# Patient Record
Sex: Female | Born: 1940 | Race: White | Hispanic: No | Marital: Married | Smoking: Never smoker
Health system: Southern US, Community
[De-identification: ages and names within clinical notes are randomized; demographics above are authoritative.]

## PROBLEM LIST (undated history)

## (undated) DIAGNOSIS — Z87898 Personal history of other specified conditions: Secondary | ICD-10-CM

## (undated) DIAGNOSIS — M8580 Other specified disorders of bone density and structure, unspecified site: Secondary | ICD-10-CM

## (undated) DIAGNOSIS — G43909 Migraine, unspecified, not intractable, without status migrainosus: Secondary | ICD-10-CM

## (undated) DIAGNOSIS — F419 Anxiety disorder, unspecified: Secondary | ICD-10-CM

## (undated) DIAGNOSIS — M1990 Unspecified osteoarthritis, unspecified site: Secondary | ICD-10-CM

## (undated) DIAGNOSIS — H9190 Unspecified hearing loss, unspecified ear: Secondary | ICD-10-CM

## (undated) DIAGNOSIS — I447 Left bundle-branch block, unspecified: Secondary | ICD-10-CM

## (undated) DIAGNOSIS — K589 Irritable bowel syndrome without diarrhea: Secondary | ICD-10-CM

## (undated) DIAGNOSIS — Z8669 Personal history of other diseases of the nervous system and sense organs: Secondary | ICD-10-CM

## (undated) DIAGNOSIS — I700 Atherosclerosis of aorta: Secondary | ICD-10-CM

## (undated) DIAGNOSIS — K219 Gastro-esophageal reflux disease without esophagitis: Secondary | ICD-10-CM

## (undated) DIAGNOSIS — E785 Hyperlipidemia, unspecified: Secondary | ICD-10-CM

## (undated) DIAGNOSIS — IMO0002 Reserved for concepts with insufficient information to code with codable children: Secondary | ICD-10-CM

## (undated) HISTORY — DX: Migraine, unspecified, not intractable, without status migrainosus: G43.909

## (undated) HISTORY — DX: Personal history of other diseases of the nervous system and sense organs: Z86.69

## (undated) HISTORY — DX: Reserved for concepts with insufficient information to code with codable children: IMO0002

## (undated) HISTORY — DX: Left bundle-branch block, unspecified: I44.7

## (undated) HISTORY — DX: Unspecified hearing loss, unspecified ear: H91.90

## (undated) HISTORY — DX: Hyperlipidemia, unspecified: E78.5

## (undated) HISTORY — DX: Personal history of other specified conditions: Z87.898

## (undated) HISTORY — DX: Unspecified osteoarthritis, unspecified site: M19.90

## (undated) HISTORY — DX: Atherosclerosis of aorta: I70.0

## (undated) HISTORY — DX: Gastro-esophageal reflux disease without esophagitis: K21.9

## (undated) HISTORY — DX: Irritable bowel syndrome, unspecified: K58.9

## (undated) HISTORY — DX: Other specified disorders of bone density and structure, unspecified site: M85.80

## (undated) HISTORY — DX: Anxiety disorder, unspecified: F41.9

---
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
## 1981-12-08 HISTORY — PX: ABDOMINAL HYSTERECTOMY: SHX81

## 1981-12-08 HISTORY — PX: APPENDECTOMY: SHX54

## 1990-12-08 HISTORY — PX: CHOLECYSTECTOMY: SHX55

## 1995-12-09 HISTORY — PX: CARDIAC CATHETERIZATION: SHX172

## 1998-03-07 ENCOUNTER — Ambulatory Visit (HOSPITAL_COMMUNITY): Admission: RE | Admit: 1998-03-07 | Discharge: 1998-03-07 | Payer: Self-pay

## 1998-03-20 ENCOUNTER — Ambulatory Visit (HOSPITAL_COMMUNITY): Admission: RE | Admit: 1998-03-20 | Discharge: 1998-03-20 | Payer: Self-pay

## 1999-04-24 ENCOUNTER — Ambulatory Visit (HOSPITAL_COMMUNITY): Admission: RE | Admit: 1999-04-24 | Discharge: 1999-04-24 | Payer: Self-pay

## 1999-04-26 ENCOUNTER — Ambulatory Visit (HOSPITAL_COMMUNITY): Admission: RE | Admit: 1999-04-26 | Discharge: 1999-04-26 | Payer: Self-pay

## 2000-04-29 ENCOUNTER — Ambulatory Visit (HOSPITAL_COMMUNITY): Admission: RE | Admit: 2000-04-29 | Discharge: 2000-04-29 | Payer: Self-pay

## 2001-06-09 ENCOUNTER — Ambulatory Visit (HOSPITAL_COMMUNITY): Admission: RE | Admit: 2001-06-09 | Discharge: 2001-06-09 | Payer: Self-pay

## 2001-12-08 DIAGNOSIS — IMO0002 Reserved for concepts with insufficient information to code with codable children: Secondary | ICD-10-CM

## 2001-12-08 HISTORY — PX: UPPER GASTROINTESTINAL ENDOSCOPY: SHX188

## 2001-12-08 HISTORY — DX: Reserved for concepts with insufficient information to code with codable children: IMO0002

## 2001-12-08 HISTORY — PX: COLONOSCOPY: SHX174

## 2001-12-08 LAB — HM COLONOSCOPY

## 2002-06-13 ENCOUNTER — Ambulatory Visit (HOSPITAL_COMMUNITY): Admission: RE | Admit: 2002-06-13 | Discharge: 2002-06-13 | Payer: Self-pay | Admitting: Endocrinology

## 2002-11-10 ENCOUNTER — Encounter (INDEPENDENT_AMBULATORY_CARE_PROVIDER_SITE_OTHER): Payer: Self-pay | Admitting: Specialist

## 2002-11-10 ENCOUNTER — Inpatient Hospital Stay (HOSPITAL_COMMUNITY): Admission: AD | Admit: 2002-11-10 | Discharge: 2002-11-11 | Payer: Self-pay | Admitting: Cardiology

## 2003-07-05 ENCOUNTER — Ambulatory Visit (HOSPITAL_COMMUNITY): Admission: RE | Admit: 2003-07-05 | Discharge: 2003-07-05 | Payer: Self-pay | Admitting: Endocrinology

## 2003-12-09 DIAGNOSIS — M8580 Other specified disorders of bone density and structure, unspecified site: Secondary | ICD-10-CM

## 2003-12-09 HISTORY — DX: Other specified disorders of bone density and structure, unspecified site: M85.80

## 2003-12-09 HISTORY — PX: OTHER SURGICAL HISTORY: SHX169

## 2004-07-10 ENCOUNTER — Ambulatory Visit (HOSPITAL_COMMUNITY): Admission: RE | Admit: 2004-07-10 | Discharge: 2004-07-10 | Payer: Self-pay | Admitting: Endocrinology

## 2004-08-09 ENCOUNTER — Encounter: Payer: Self-pay | Admitting: Internal Medicine

## 2005-07-11 ENCOUNTER — Ambulatory Visit (HOSPITAL_COMMUNITY): Admission: RE | Admit: 2005-07-11 | Discharge: 2005-07-11 | Payer: Self-pay | Admitting: Endocrinology

## 2006-07-17 ENCOUNTER — Ambulatory Visit (HOSPITAL_COMMUNITY): Admission: RE | Admit: 2006-07-17 | Discharge: 2006-07-17 | Payer: Self-pay | Admitting: Internal Medicine

## 2006-07-17 IMAGING — MG MM DIGITAL SCREENING BILAT
4 series · 4 of 4 positions shown · non-contrast
Comparison: none

DG SCREEN MAMMOGRAM BILATERAL
Bilateral CC and MLO view(s) were taken.

SCREENING MAMMOGRAM:
There is a fibroglandular pattern.  No masses or malignant type calcifications are identified.  
Compared with prior studies.

[R CC]
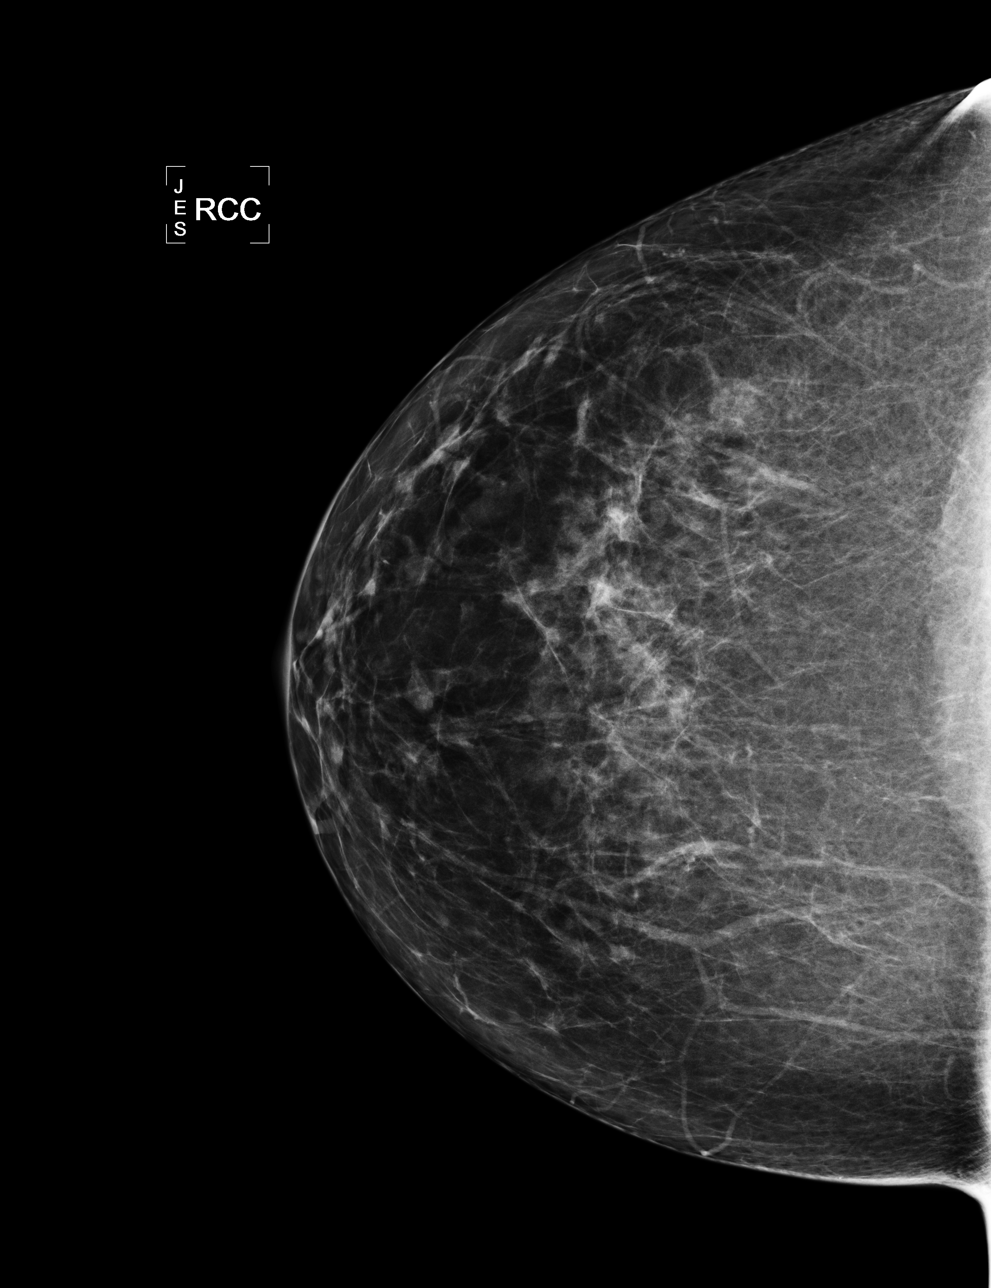

[R MLO]
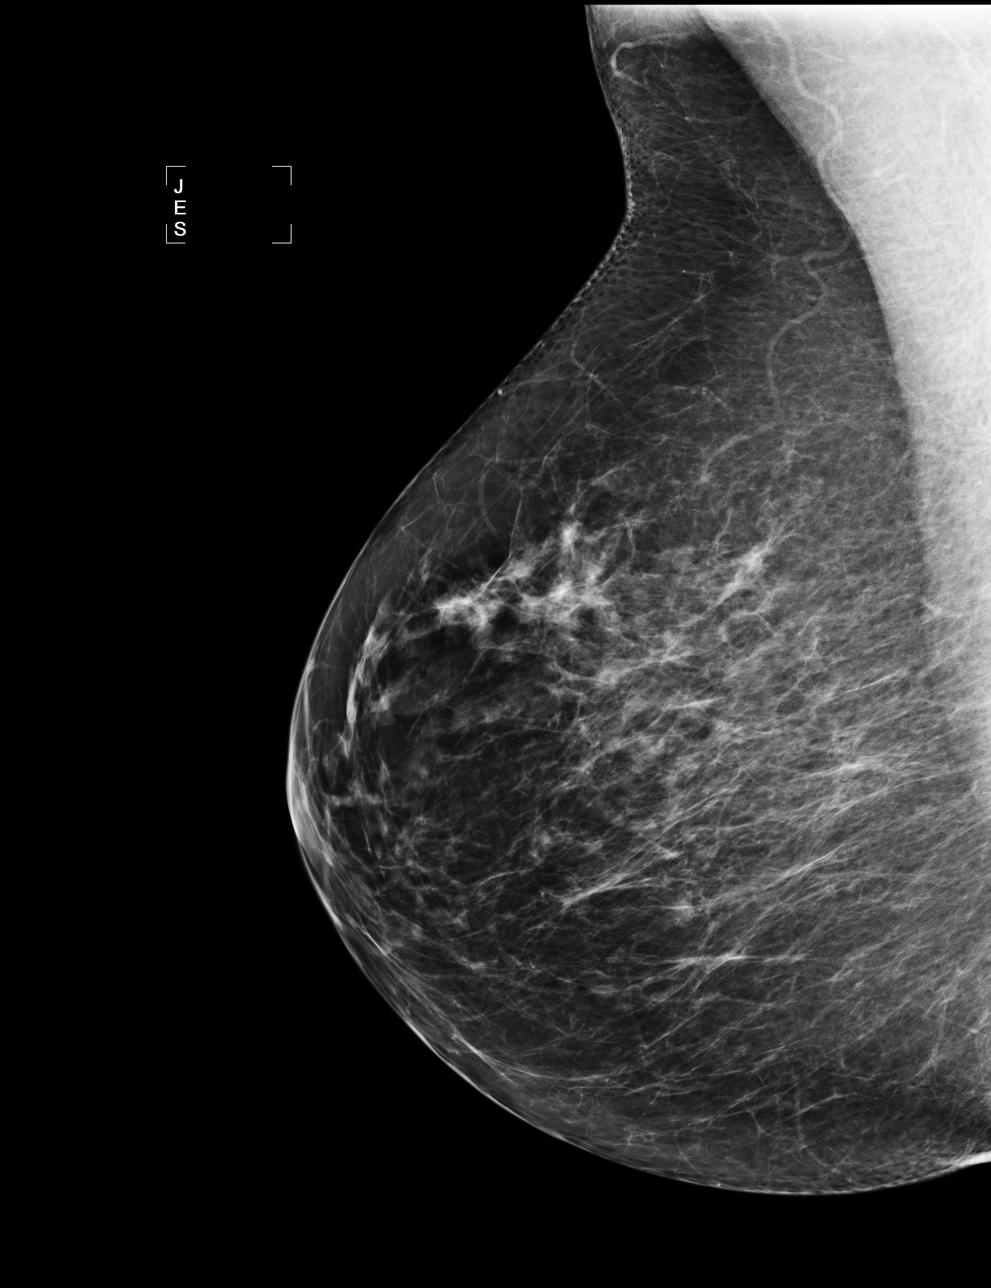

[L CC]
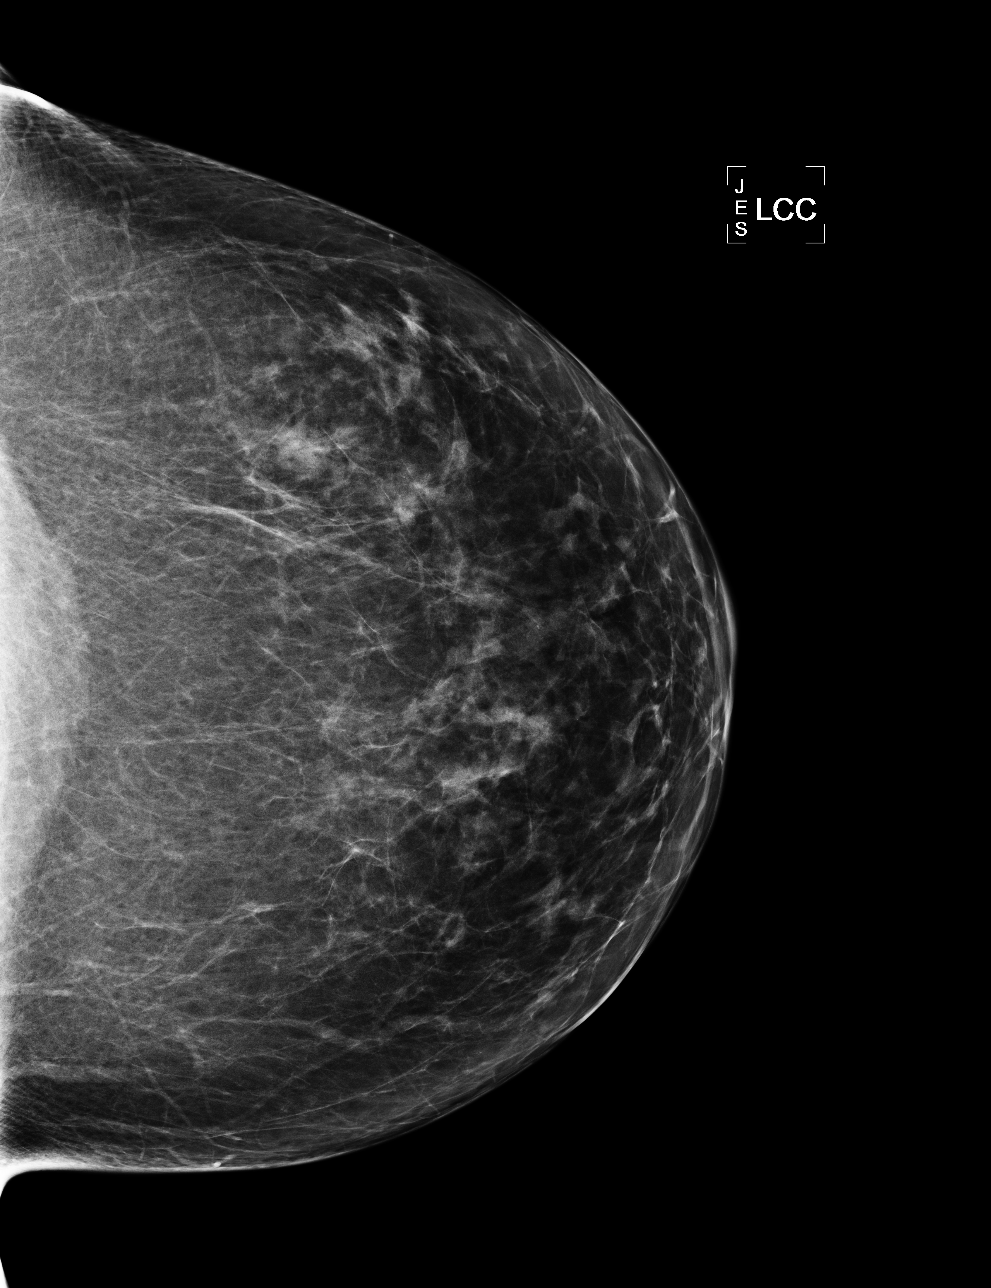

[L MLO]
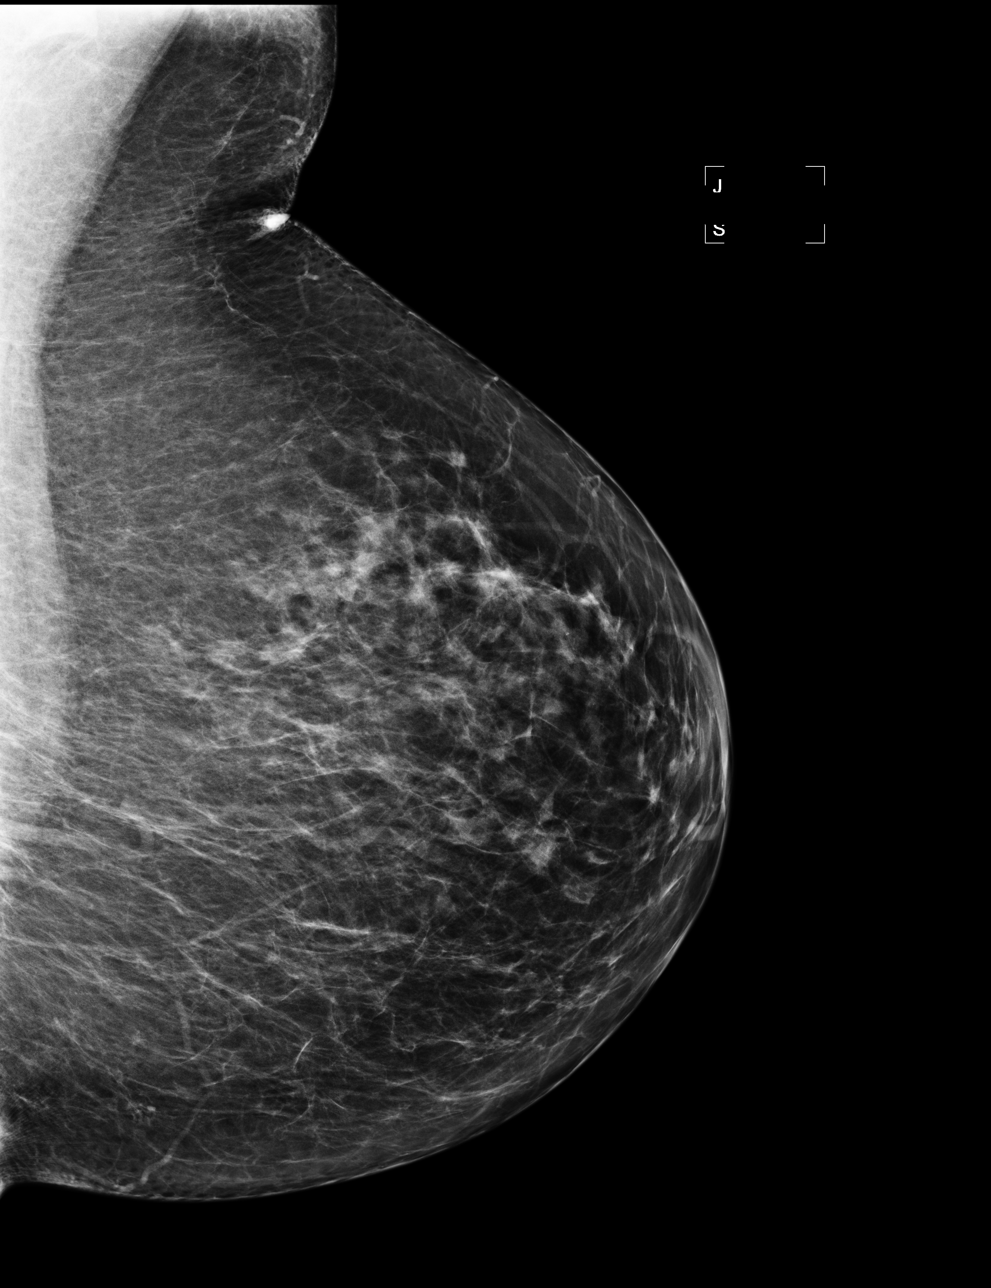

[4 of 4 positions shown; findings below may reference images not displayed]

IMPRESSION: No specific mammographic evidence of malignancy.  Next screening mammogram is recommended in one 
year.

ASSESSMENT: Negative - BI-RADS 1

Screening mammogram in 1 year.
ANALYZED BY COMPUTER AIDED DETECTION. , THIS PROCEDURE WAS A DIGITAL MAMMOGRAM.

## 2006-07-21 ENCOUNTER — Ambulatory Visit: Payer: Self-pay | Admitting: Internal Medicine

## 2006-10-06 ENCOUNTER — Ambulatory Visit: Payer: Self-pay | Admitting: Internal Medicine

## 2007-01-11 ENCOUNTER — Ambulatory Visit: Payer: Self-pay | Admitting: Internal Medicine

## 2007-01-22 ENCOUNTER — Ambulatory Visit: Payer: Self-pay | Admitting: Internal Medicine

## 2007-07-08 DIAGNOSIS — M949 Disorder of cartilage, unspecified: Secondary | ICD-10-CM

## 2007-07-08 DIAGNOSIS — M899 Disorder of bone, unspecified: Secondary | ICD-10-CM | POA: Insufficient documentation

## 2007-07-08 DIAGNOSIS — F39 Unspecified mood [affective] disorder: Secondary | ICD-10-CM | POA: Insufficient documentation

## 2007-07-08 DIAGNOSIS — E785 Hyperlipidemia, unspecified: Secondary | ICD-10-CM | POA: Insufficient documentation

## 2007-07-08 DIAGNOSIS — K589 Irritable bowel syndrome without diarrhea: Secondary | ICD-10-CM | POA: Insufficient documentation

## 2007-07-08 DIAGNOSIS — K219 Gastro-esophageal reflux disease without esophagitis: Secondary | ICD-10-CM | POA: Insufficient documentation

## 2007-07-20 ENCOUNTER — Ambulatory Visit (HOSPITAL_COMMUNITY): Admission: RE | Admit: 2007-07-20 | Discharge: 2007-07-20 | Payer: Self-pay | Admitting: Internal Medicine

## 2007-07-20 DIAGNOSIS — M1990 Unspecified osteoarthritis, unspecified site: Secondary | ICD-10-CM | POA: Insufficient documentation

## 2007-07-20 IMAGING — MG MM DIGITAL SCREENING BILAT
4 series · 4 of 4 positions shown · non-contrast
Comparison: none

DG SCREEN MAMMOGRAM BILATERAL
Bilateral CC and MLO view(s) were taken.

DIGITAL SCREENING MAMMOGRAM WITH CAD:
There are scattered fibroglandular densities.  No masses or malignant type calcifications are 
identified.  Compared with prior studies.

[R CC]
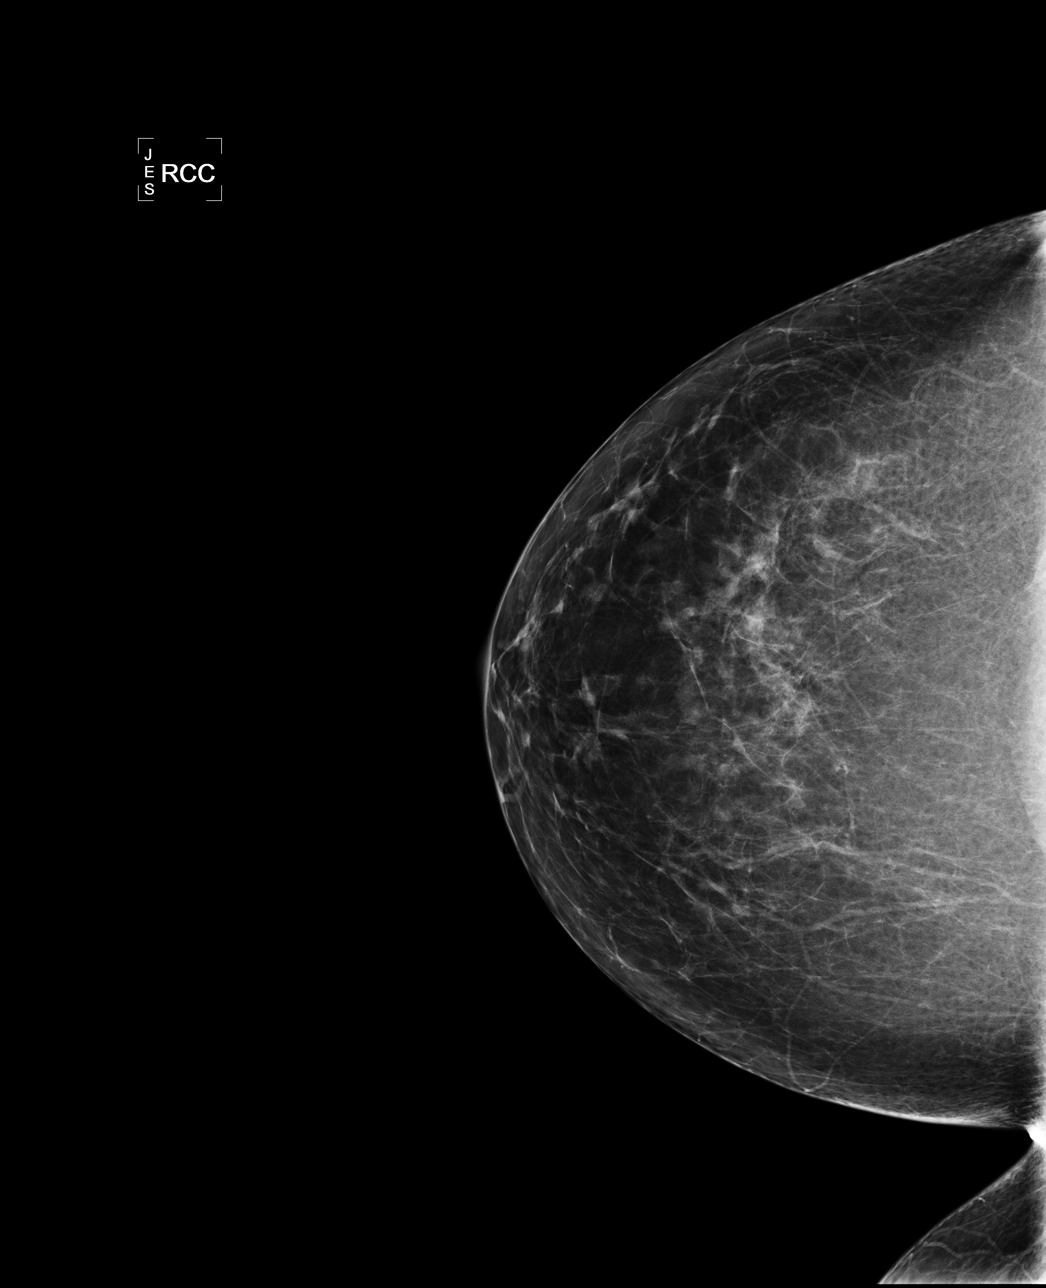

[R MLO]
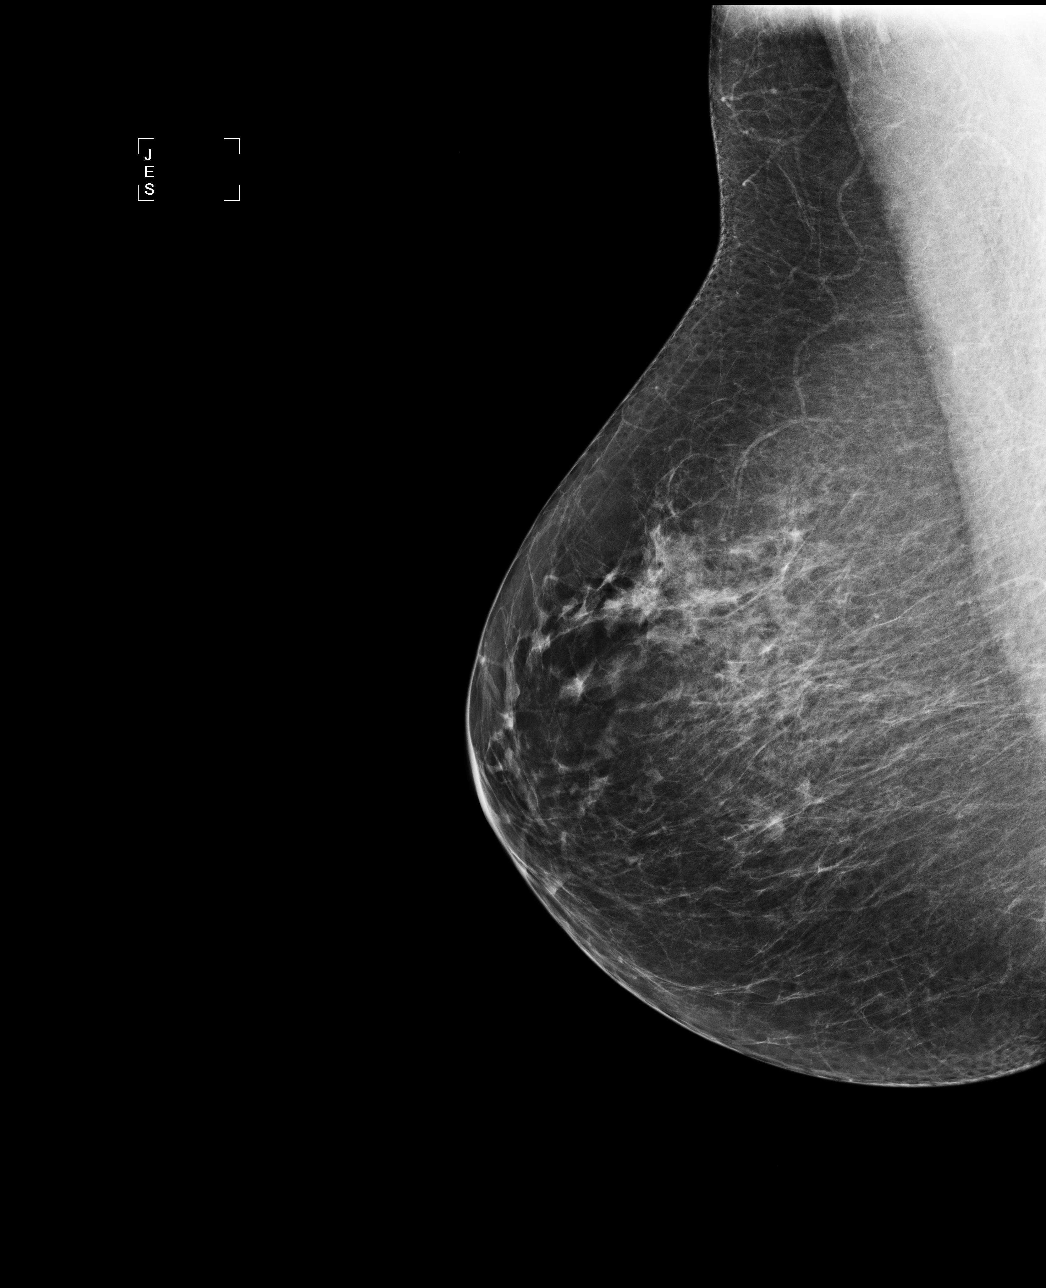

[L CC]
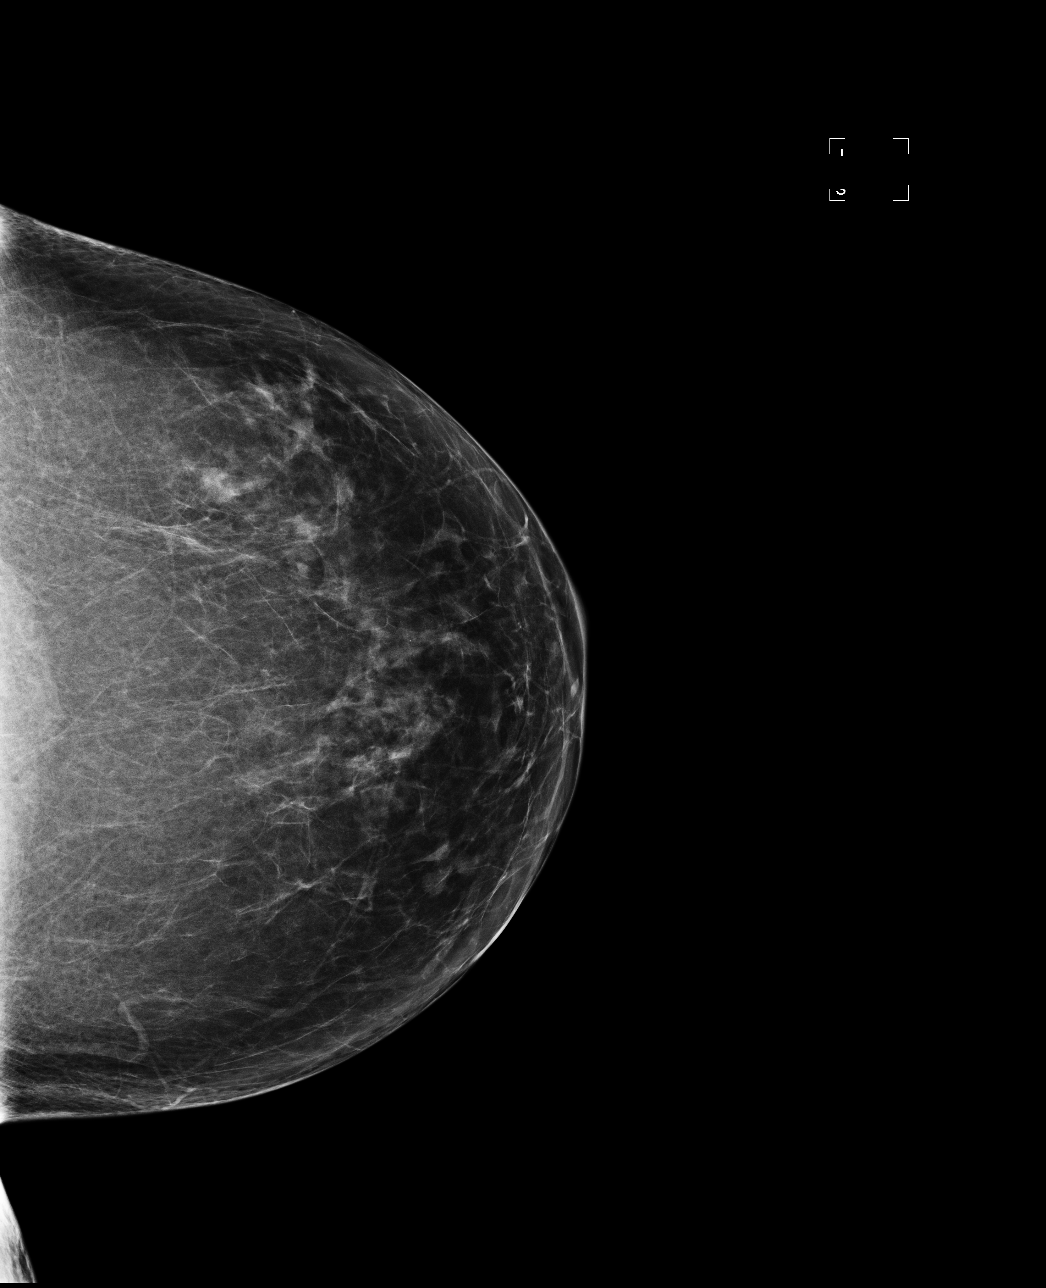

[L MLO]
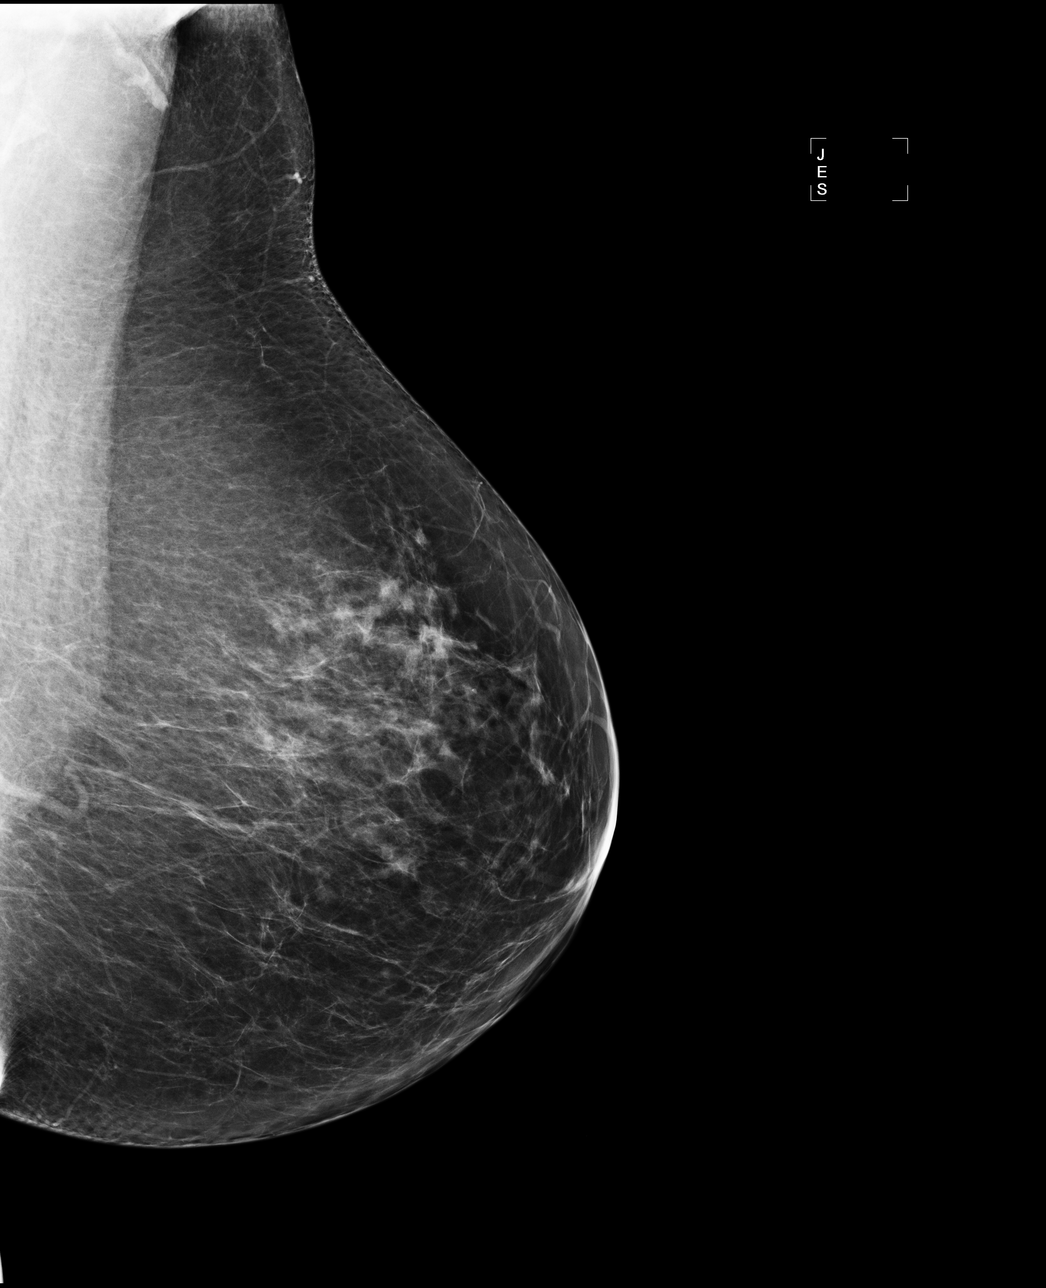

[4 of 4 positions shown; findings below may reference images not displayed]

IMPRESSION: No specific mammographic evidence of malignancy.  Next screening mammogram is recommended in one 
year.

ASSESSMENT: Negative - BI-RADS 1

Screening mammogram in 1 year.
ANALYZED BY COMPUTER AIDED DETECTION. , THIS PROCEDURE WAS A DIGITAL MAMMOGRAM.

## 2007-07-21 ENCOUNTER — Encounter (INDEPENDENT_AMBULATORY_CARE_PROVIDER_SITE_OTHER): Payer: Self-pay | Admitting: *Deleted

## 2007-07-23 ENCOUNTER — Ambulatory Visit: Payer: Self-pay | Admitting: Internal Medicine

## 2007-07-23 DIAGNOSIS — R300 Dysuria: Secondary | ICD-10-CM | POA: Insufficient documentation

## 2007-07-23 LAB — CONVERTED CEMR LAB
Blood in Urine, dipstick: NEGATIVE
CO2: 31 meq/L (ref 19–32)
Cholesterol: 182 mg/dL (ref 0–200)
Creatinine, Ser: 0.9 mg/dL (ref 0.4–1.2)
Glucose, Bld: 100 mg/dL — ABNORMAL HIGH (ref 70–99)
Glucose, Urine, Semiquant: NEGATIVE
LDL Cholesterol: 113 mg/dL — ABNORMAL HIGH (ref 0–99)
Nitrite: NEGATIVE
Phosphorus: 3.4 mg/dL (ref 2.3–4.6)
Protein, U semiquant: NEGATIVE
Sodium: 146 meq/L — ABNORMAL HIGH (ref 135–145)
Total CHOL/HDL Ratio: 3.6
Triglycerides: 92 mg/dL (ref 0–149)
WBC Urine, dipstick: NEGATIVE

## 2007-09-27 ENCOUNTER — Ambulatory Visit: Payer: Self-pay | Admitting: Internal Medicine

## 2007-10-15 ENCOUNTER — Ambulatory Visit: Payer: Self-pay | Admitting: Internal Medicine

## 2008-02-10 ENCOUNTER — Ambulatory Visit: Payer: Self-pay | Admitting: Family Medicine

## 2008-07-18 ENCOUNTER — Telehealth (INDEPENDENT_AMBULATORY_CARE_PROVIDER_SITE_OTHER): Payer: Self-pay | Admitting: *Deleted

## 2008-07-20 ENCOUNTER — Ambulatory Visit (HOSPITAL_COMMUNITY): Admission: RE | Admit: 2008-07-20 | Discharge: 2008-07-20 | Payer: Self-pay | Admitting: Internal Medicine

## 2008-07-20 IMAGING — MG MM DIGITAL SCREENING BILAT
4 series · 4 of 4 positions shown · non-contrast
Comparison: none

DG SCREEN MAMMOGRAM BILATERAL
Bilateral CC and MLO view(s) were taken.
Technologist: KETAN.(KETAN)(M)
Prior study comparison: [DATE], DG screen mammogram bilateral.

DIGITAL SCREENING MAMMOGRAM WITH CAD:
There are scattered fibroglandular densities.  No masses or malignant type calcifications are 
identified.  Compared with prior studies.

[R CC]
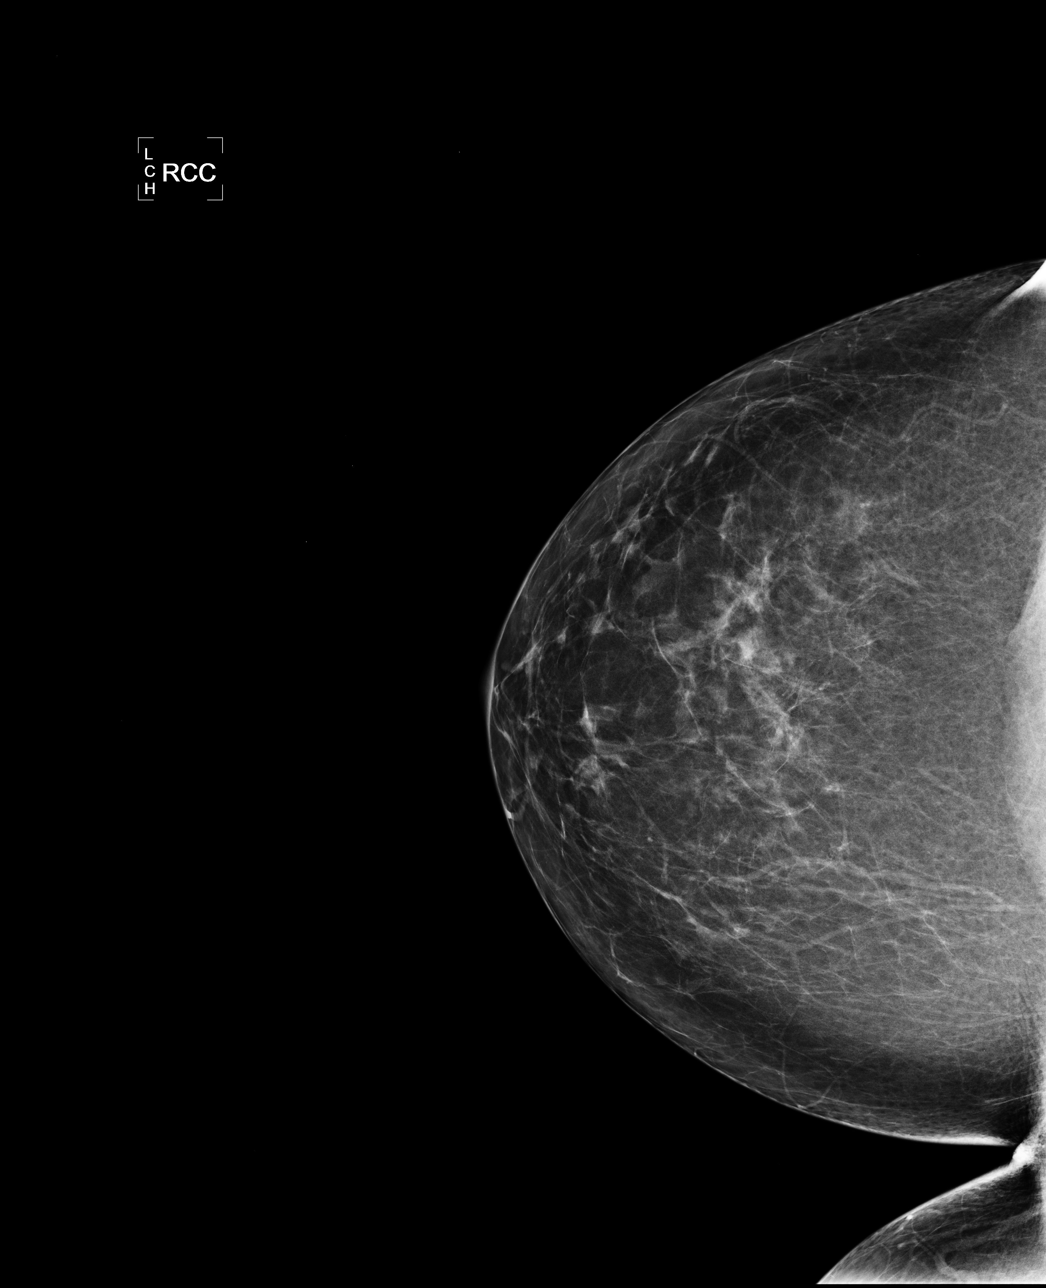

[R MLO]
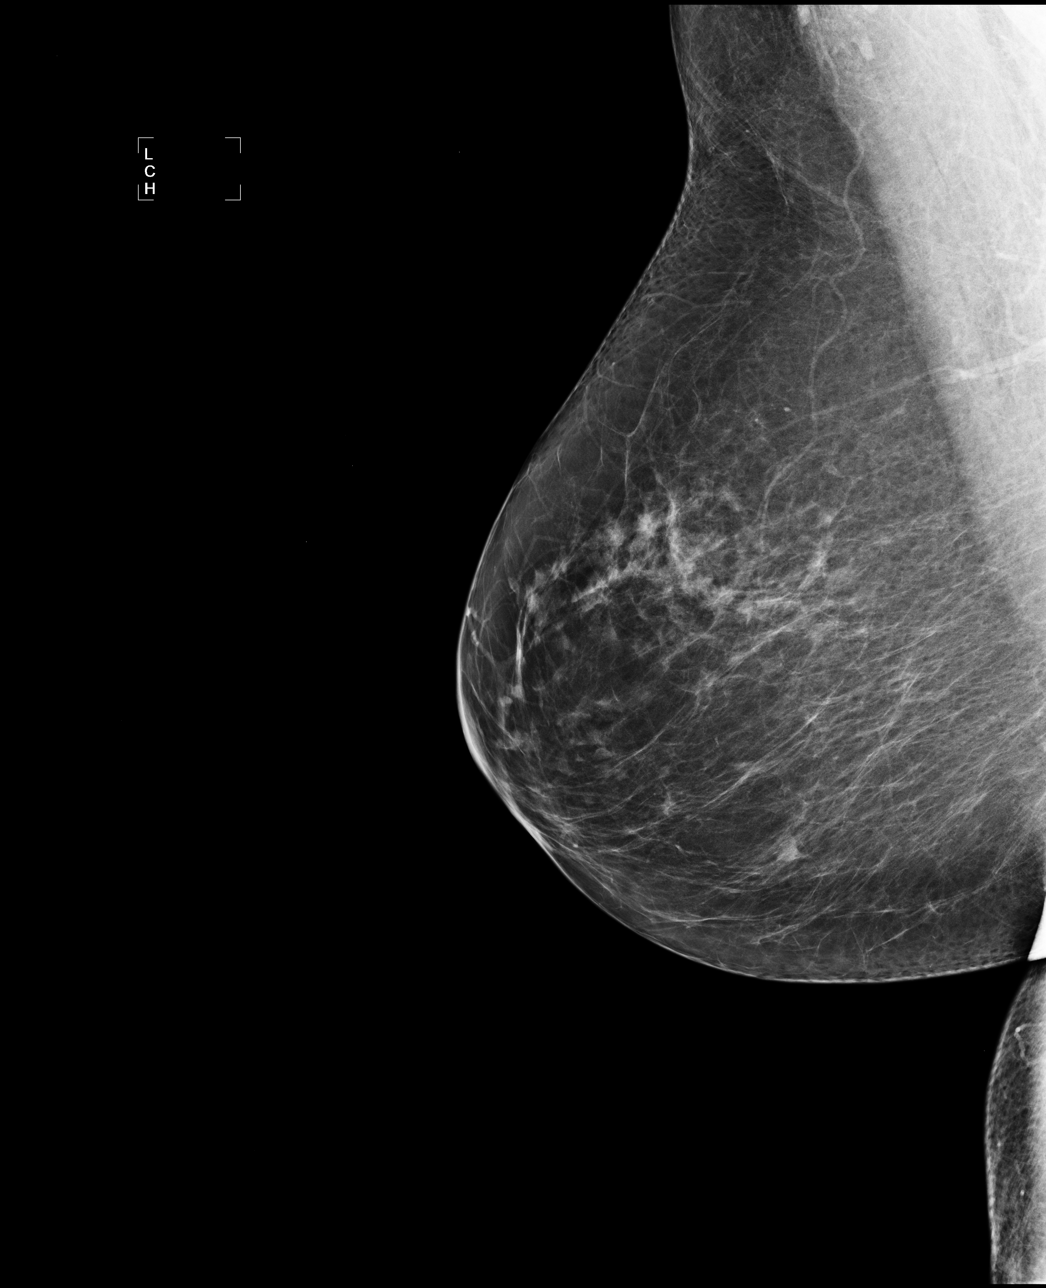

[L CC]
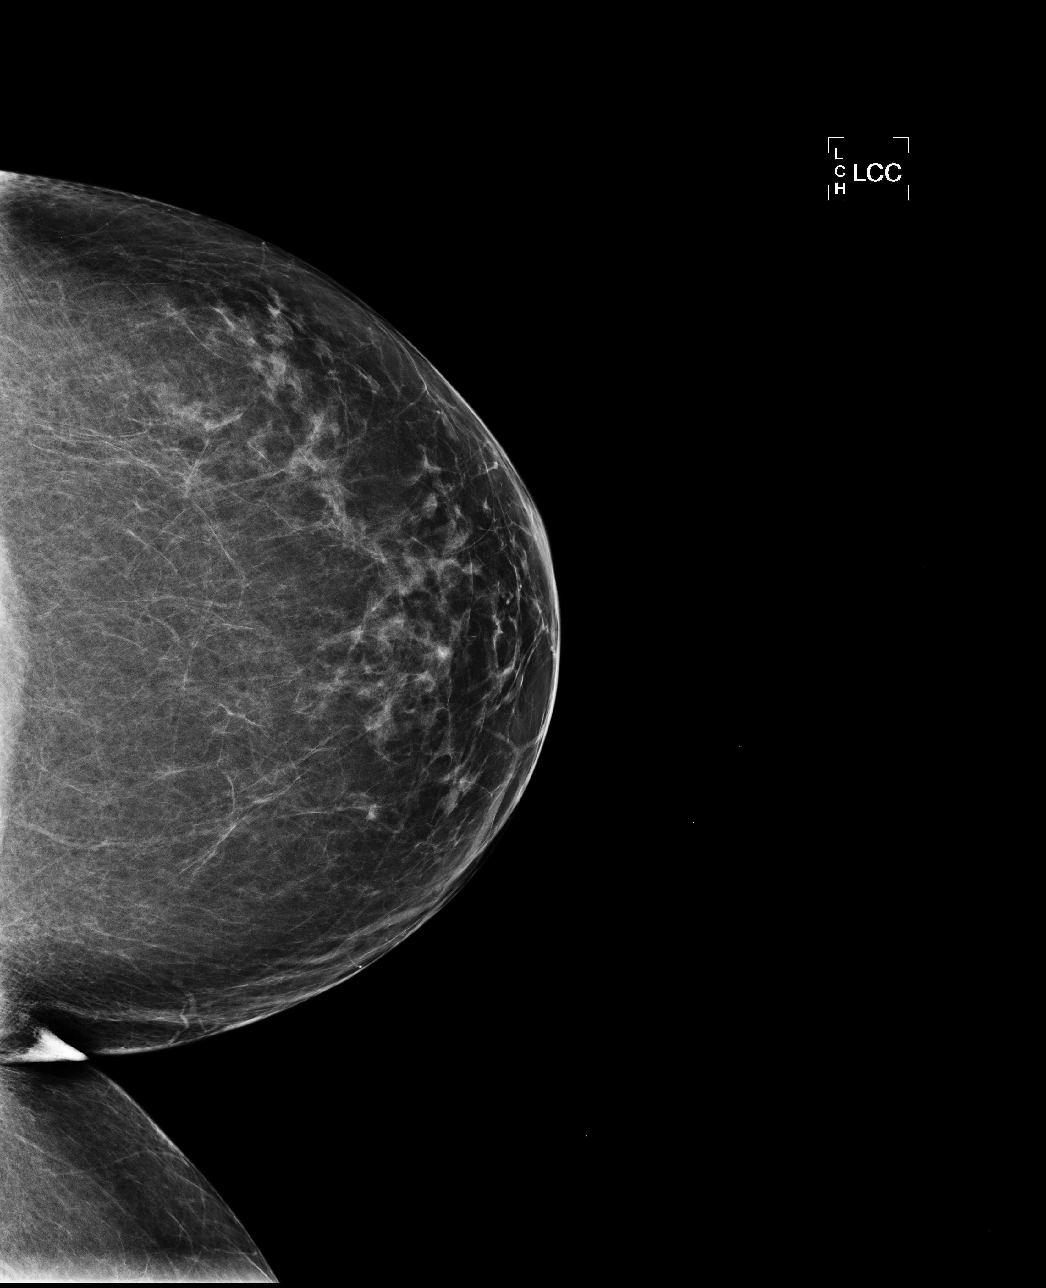

[L MLO]
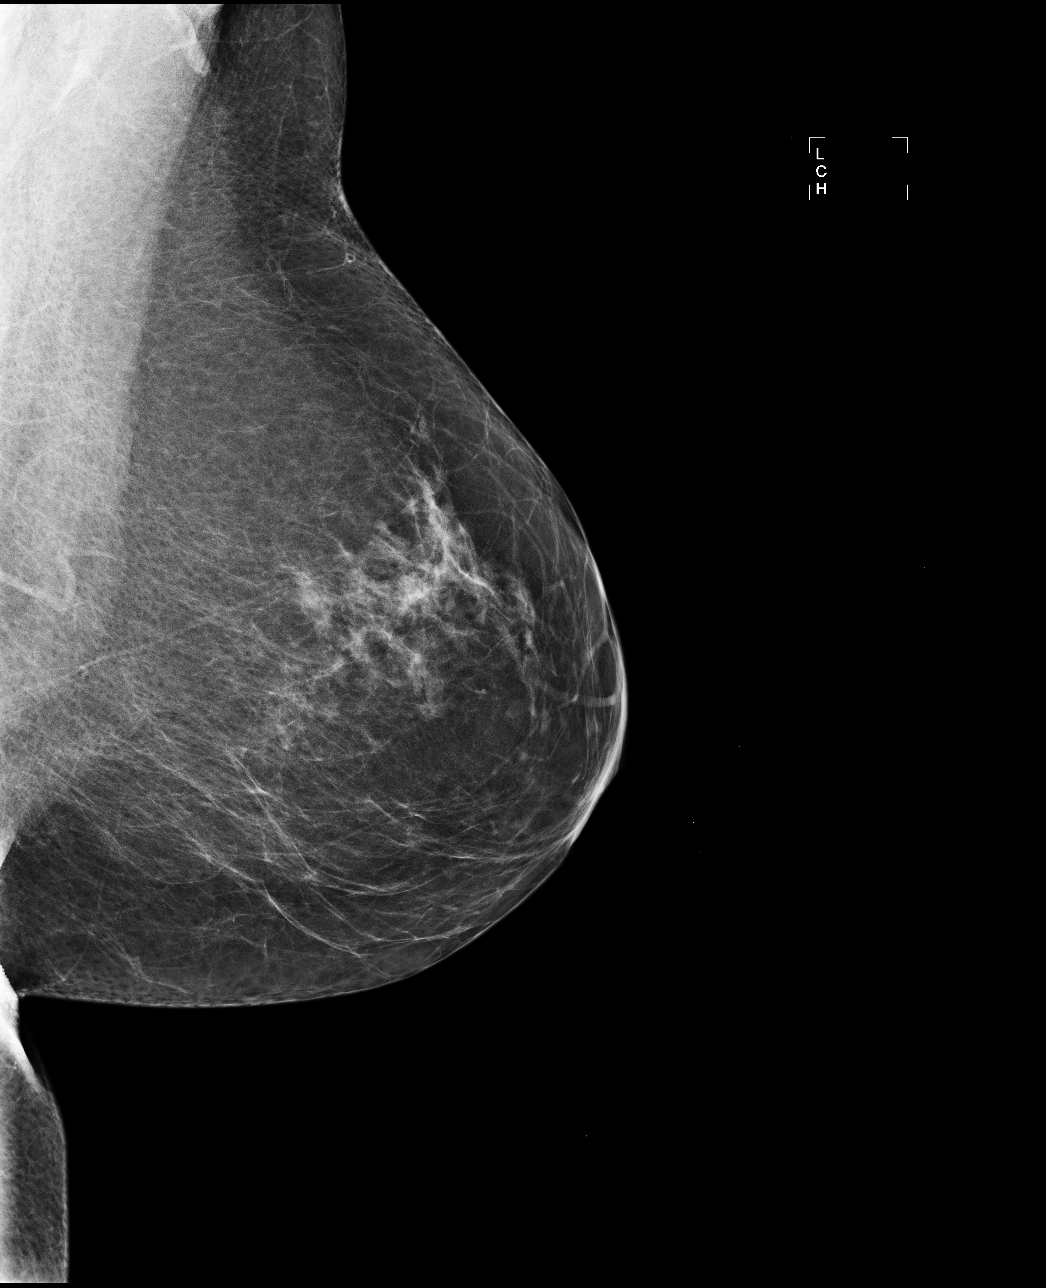

[4 of 4 positions shown; findings below may reference images not displayed]

IMPRESSION: No specific mammographic evidence of malignancy.  Next screening mammogram is recommended in one 
year.

ASSESSMENT: Negative - BI-RADS 1

Screening mammogram in 1 year.
ANALYZED BY COMPUTER AIDED DETECTION. , THIS PROCEDURE WAS A DIGITAL MAMMOGRAM.

## 2008-07-25 ENCOUNTER — Encounter: Payer: Self-pay | Admitting: Internal Medicine

## 2008-08-04 ENCOUNTER — Ambulatory Visit: Payer: Self-pay | Admitting: Internal Medicine

## 2008-08-04 LAB — CONVERTED CEMR LAB
ALT: 19 units/L (ref 0–35)
Basophils Absolute: 0 10*3/uL (ref 0.0–0.1)
Eosinophils Absolute: 0.2 10*3/uL (ref 0.0–0.7)
GFR calc Af Amer: 81 mL/min
Glucose, Bld: 103 mg/dL — ABNORMAL HIGH (ref 70–99)
HCT: 42.4 % (ref 36.0–46.0)
Hemoglobin: 14.3 g/dL (ref 12.0–15.0)
MCHC: 33.6 g/dL (ref 30.0–36.0)
MCV: 89.4 fL (ref 78.0–100.0)
Monocytes Absolute: 0.4 10*3/uL (ref 0.1–1.0)
Monocytes Relative: 7.4 % (ref 3.0–12.0)
Neutro Abs: 3.8 10*3/uL (ref 1.4–7.7)
Phosphorus: 4.1 mg/dL (ref 2.3–4.6)
Potassium: 5.1 meq/L (ref 3.5–5.1)
RDW: 13 % (ref 11.5–14.6)
Sodium: 142 meq/L (ref 135–145)
TSH: 2.13 microintl units/mL (ref 0.35–5.50)
Total Bilirubin: 0.5 mg/dL (ref 0.3–1.2)

## 2008-08-25 ENCOUNTER — Encounter: Payer: Self-pay | Admitting: Internal Medicine

## 2008-09-20 ENCOUNTER — Ambulatory Visit: Payer: Self-pay | Admitting: Internal Medicine

## 2008-10-26 ENCOUNTER — Ambulatory Visit: Payer: Self-pay | Admitting: Family Medicine

## 2009-05-14 ENCOUNTER — Telehealth: Payer: Self-pay | Admitting: Internal Medicine

## 2009-07-16 ENCOUNTER — Ambulatory Visit: Payer: Self-pay | Admitting: Internal Medicine

## 2009-07-23 ENCOUNTER — Ambulatory Visit (HOSPITAL_COMMUNITY): Admission: RE | Admit: 2009-07-23 | Discharge: 2009-07-23 | Payer: Self-pay | Admitting: Internal Medicine

## 2009-07-23 IMAGING — MG MM DIGITAL SCREENING BILAT
4 series · 4 of 4 positions shown · non-contrast
Comparison: Prior studies.

DG SCREEN MAMMOGRAM BILATERAL
Bilateral CC and MLO view(s) were taken.
Technologist: NYA, RT RM
Prior study comparison: [DATE], DG screen mammogram bilateral.

DIGITAL SCREENING MAMMOGRAM WITH CAD:

[R CC]
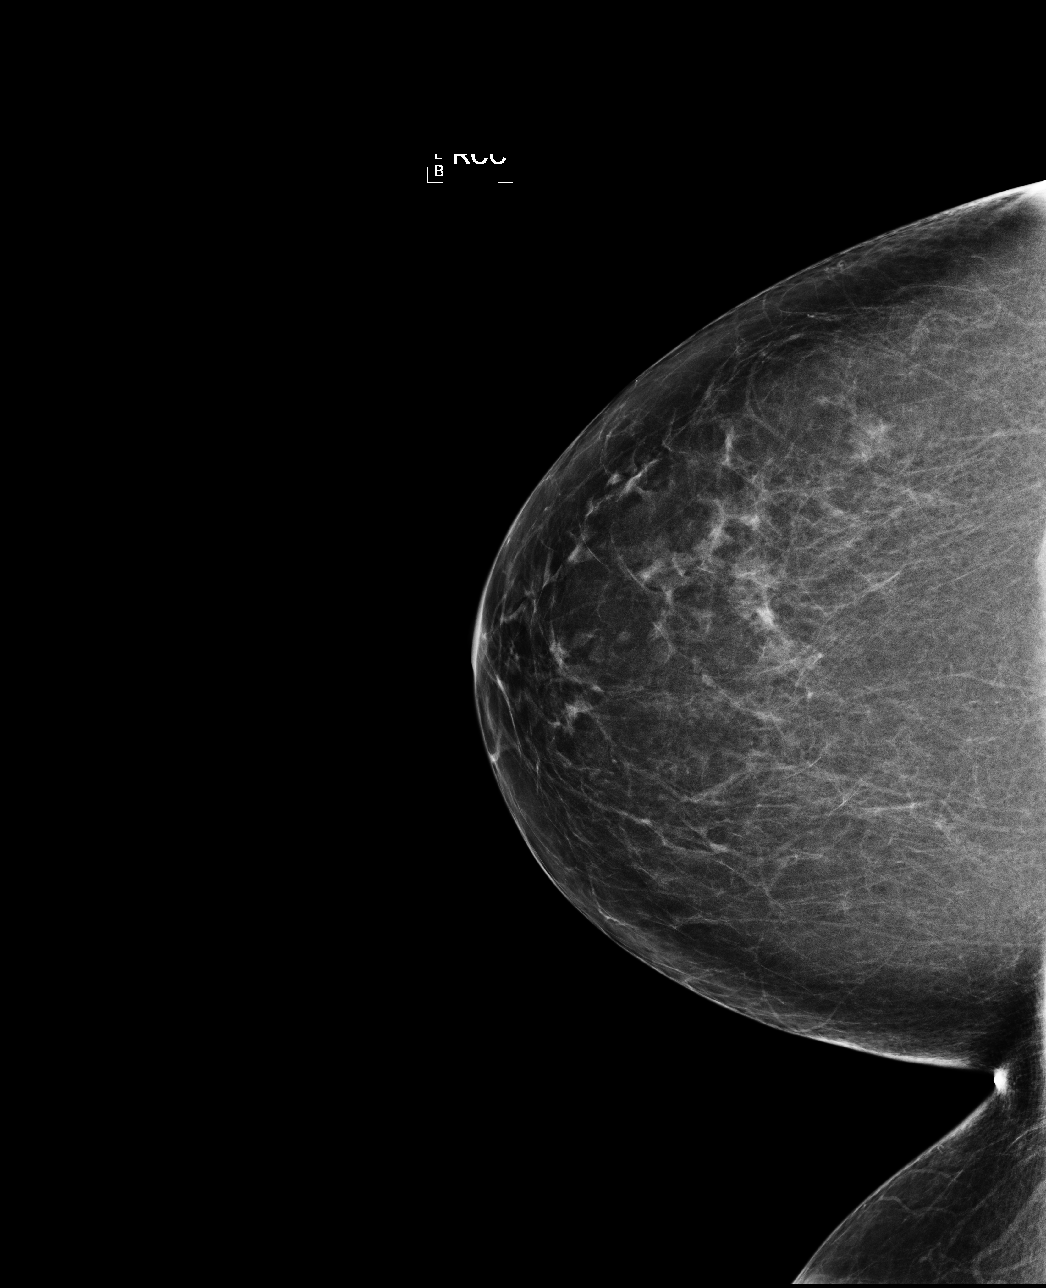

[R MLO]
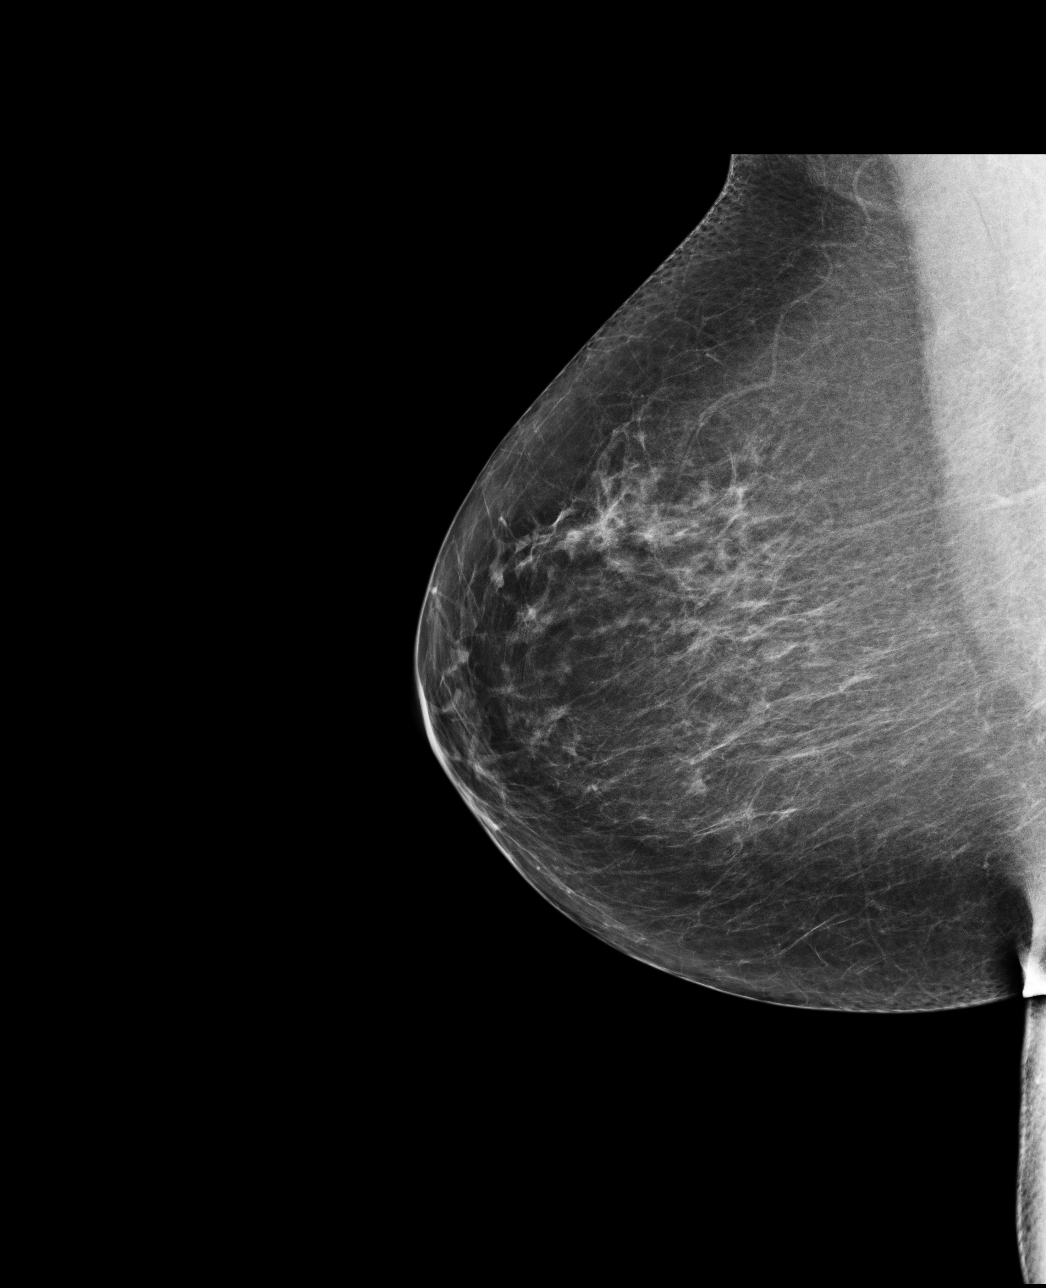

[L CC]
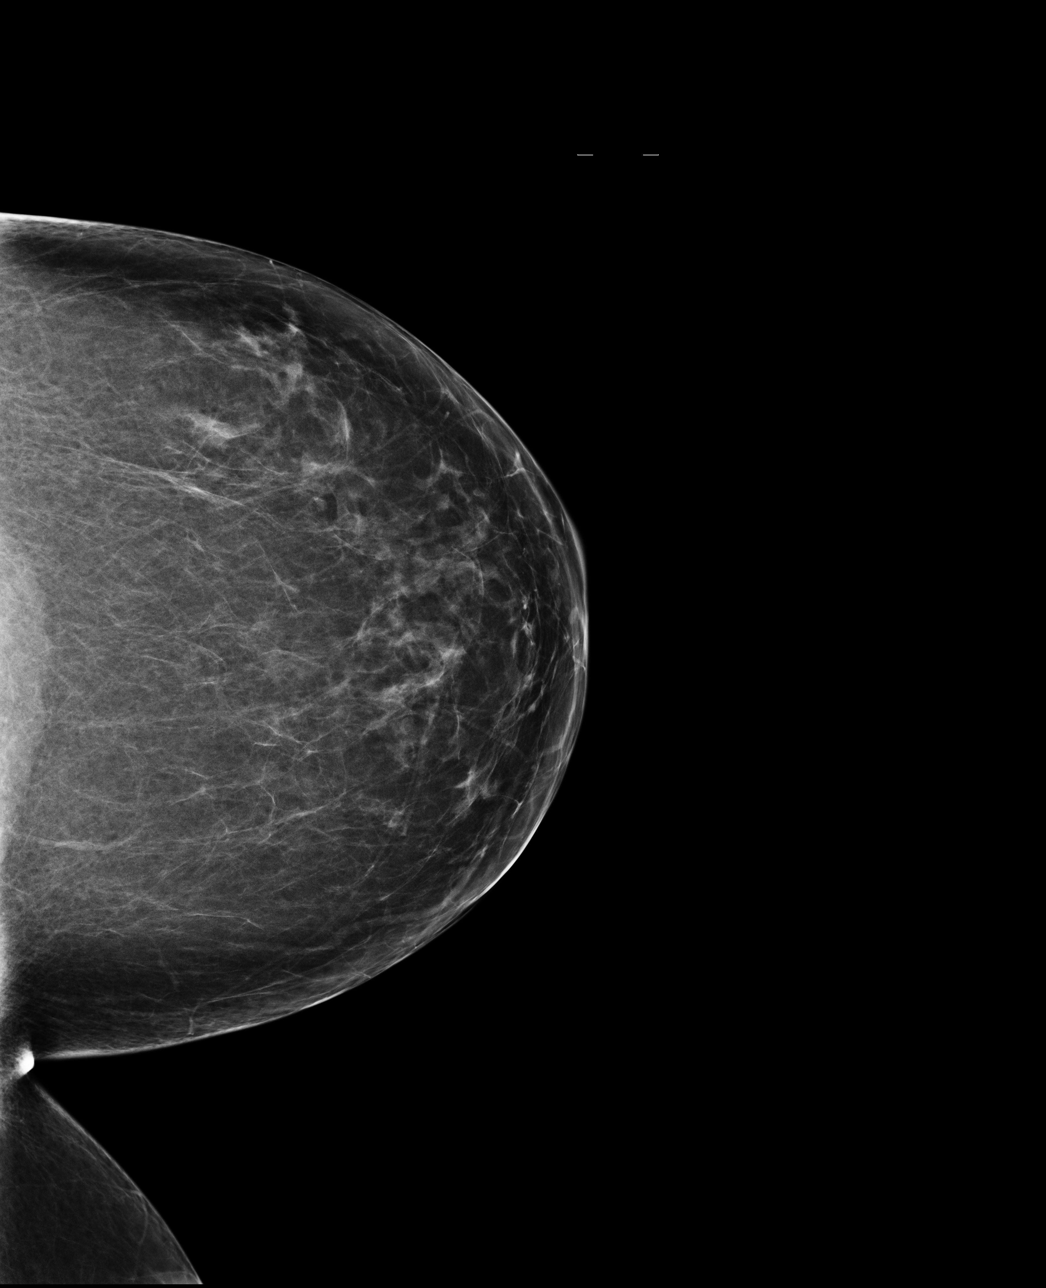

[L MLO]
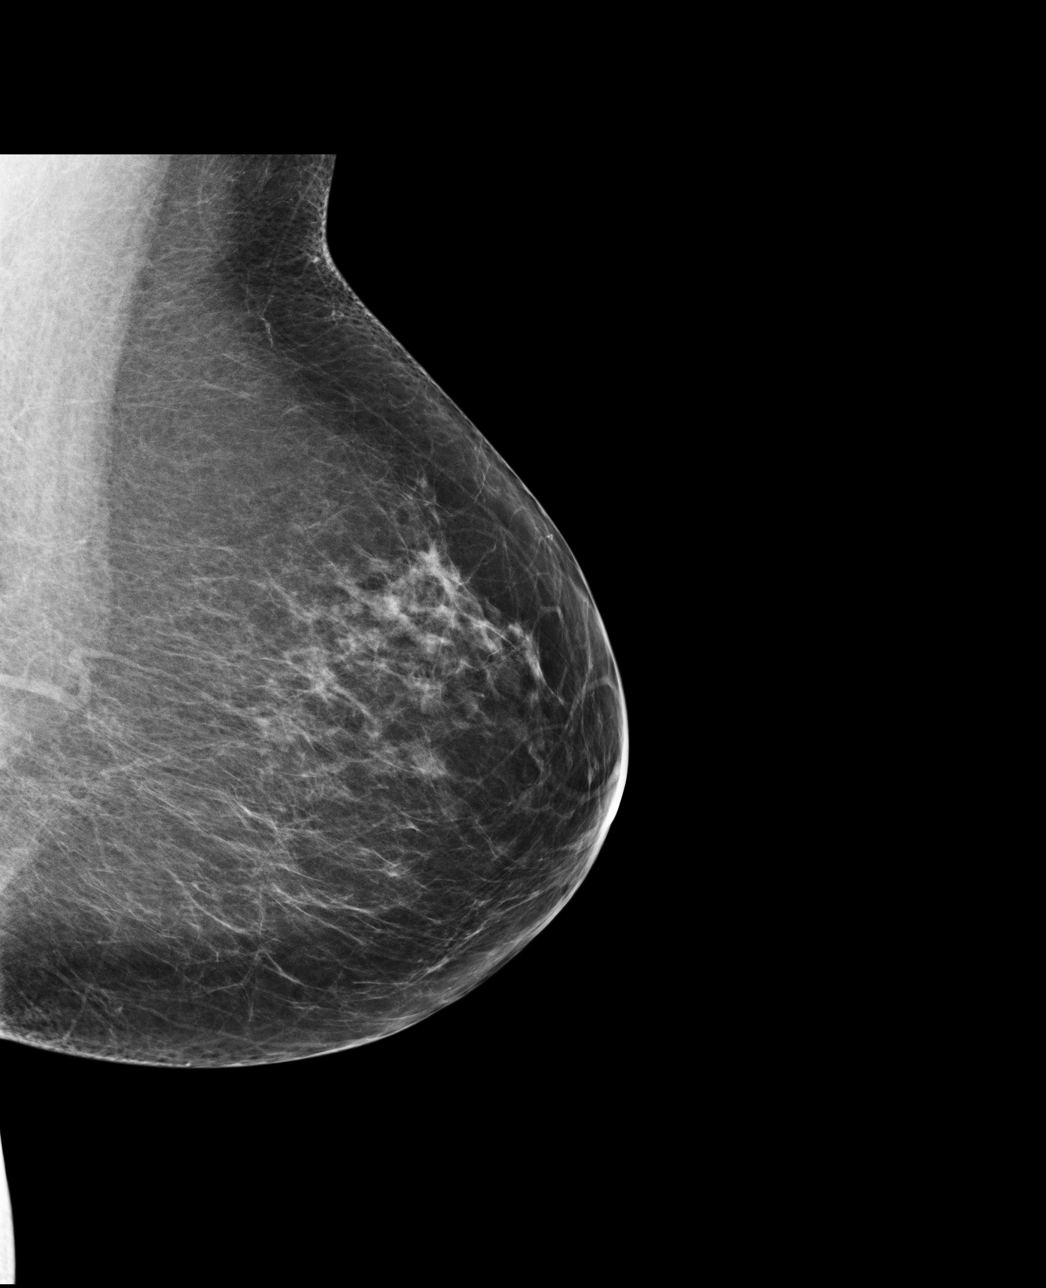

[4 of 4 positions shown; findings below may reference images not displayed]

There are scattered fibroglandular densities.  There is no dominant mass, architectural distortion 
or calcification to suggest malignancy.

Images were processed with CAD.
IMPRESSION: No mammographic evidence of malignancy.  Suggest yearly screening mammography.

A result letter of this screening mammogram will be mailed directly to the patient.

ASSESSMENT: Negative - BI-RADS 1

Screening mammogram in 1 year.
,

## 2009-07-25 ENCOUNTER — Encounter: Payer: Self-pay | Admitting: Internal Medicine

## 2009-08-27 ENCOUNTER — Ambulatory Visit: Payer: Self-pay | Admitting: Family Medicine

## 2009-08-27 DIAGNOSIS — L439 Lichen planus, unspecified: Secondary | ICD-10-CM | POA: Insufficient documentation

## 2009-09-26 ENCOUNTER — Ambulatory Visit: Payer: Self-pay | Admitting: Internal Medicine

## 2009-10-10 ENCOUNTER — Ambulatory Visit: Payer: Self-pay | Admitting: Family Medicine

## 2009-10-10 ENCOUNTER — Encounter: Payer: Self-pay | Admitting: Family Medicine

## 2009-10-23 ENCOUNTER — Ambulatory Visit: Payer: Self-pay | Admitting: Family Medicine

## 2009-11-08 ENCOUNTER — Ambulatory Visit: Payer: Self-pay | Admitting: Family Medicine

## 2009-12-12 ENCOUNTER — Ambulatory Visit: Payer: Self-pay | Admitting: Family Medicine

## 2009-12-12 ENCOUNTER — Other Ambulatory Visit: Admission: RE | Admit: 2009-12-12 | Discharge: 2009-12-12 | Payer: Self-pay | Admitting: Family Medicine

## 2009-12-12 LAB — HM PAP SMEAR

## 2009-12-19 ENCOUNTER — Encounter (INDEPENDENT_AMBULATORY_CARE_PROVIDER_SITE_OTHER): Payer: Self-pay | Admitting: *Deleted

## 2009-12-26 ENCOUNTER — Ambulatory Visit: Payer: Self-pay | Admitting: Family Medicine

## 2010-04-02 ENCOUNTER — Ambulatory Visit: Payer: Self-pay | Admitting: Internal Medicine

## 2010-04-02 ENCOUNTER — Ambulatory Visit: Payer: Self-pay | Admitting: Family Medicine

## 2010-04-02 DIAGNOSIS — L570 Actinic keratosis: Secondary | ICD-10-CM | POA: Insufficient documentation

## 2010-05-07 ENCOUNTER — Ambulatory Visit: Payer: Self-pay | Admitting: Obstetrics & Gynecology

## 2010-05-17 ENCOUNTER — Ambulatory Visit: Payer: Self-pay | Admitting: Internal Medicine

## 2010-05-20 LAB — CONVERTED CEMR LAB
AST: 20 units/L (ref 0–37)
Alkaline Phosphatase: 59 units/L (ref 39–117)
Total Bilirubin: 0.2 mg/dL — ABNORMAL LOW (ref 0.3–1.2)
Total CHOL/HDL Ratio: 4

## 2010-07-26 ENCOUNTER — Ambulatory Visit (HOSPITAL_COMMUNITY): Admission: RE | Admit: 2010-07-26 | Discharge: 2010-07-26 | Payer: Self-pay | Admitting: Internal Medicine

## 2010-07-26 LAB — HM MAMMOGRAPHY: HM Mammogram: NEGATIVE

## 2010-07-26 IMAGING — MG MM DIGITAL SCREENING
4 series · 4 of 4 positions shown · non-contrast
Comparison: none

DG SCREEN MAMMOGRAM BILATERAL
Bilateral CC and MLO view(s) were taken.
Technologist: EDUCATIONORGANIZATION.(EDUCATIONORGANIZATION)(M)

DIGITAL SCREENING MAMMOGRAM WITH CAD:
There are scattered fibroglandular densities.  No masses or malignant type calcifications are 
identified.  Compared with prior studies.
Images were processed with CAD.

[R CC]
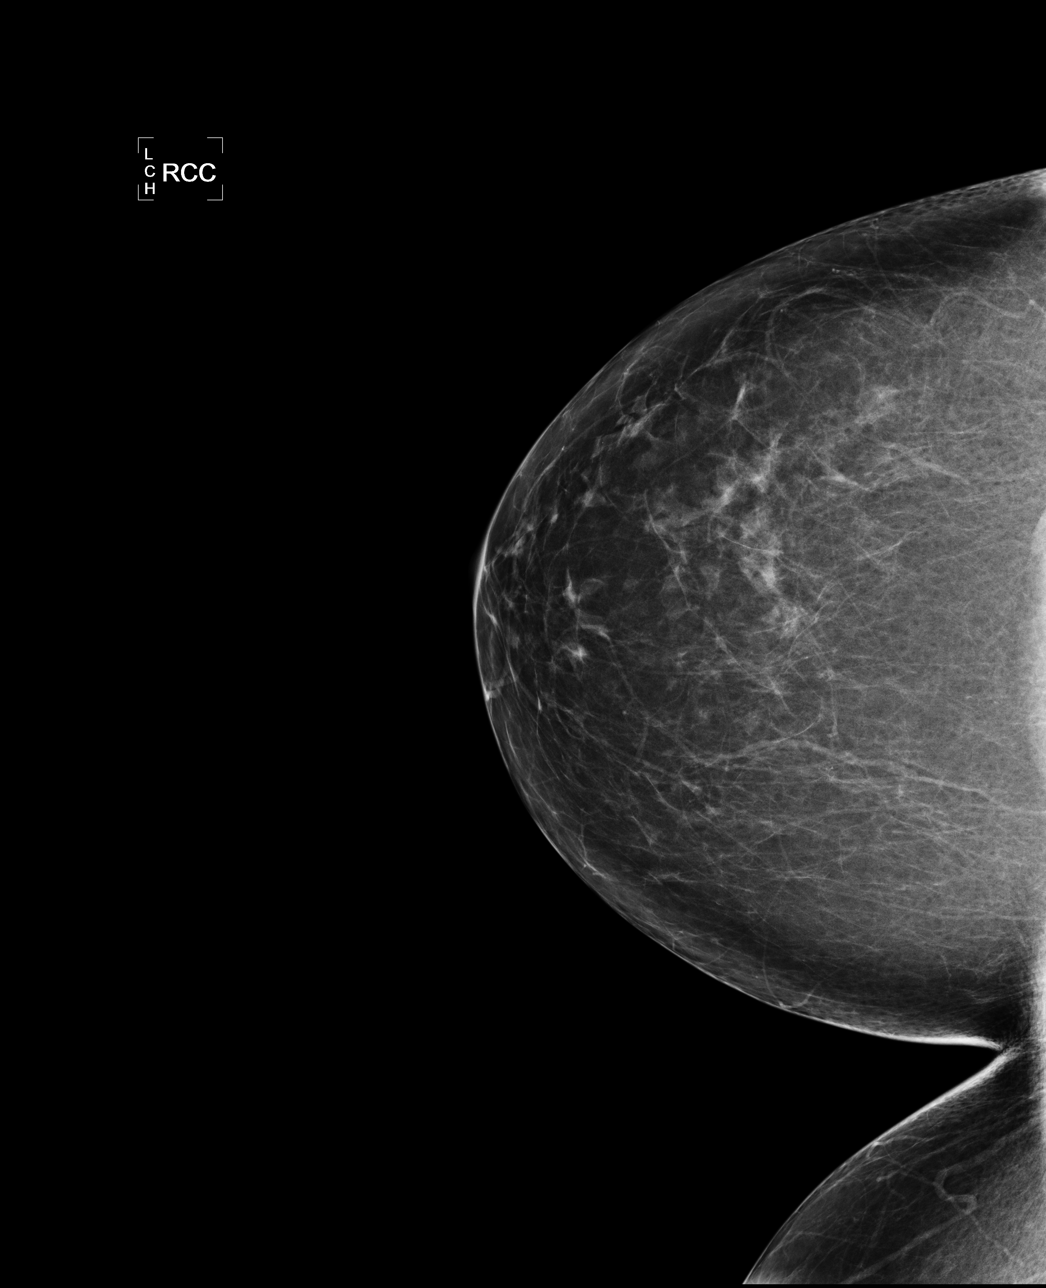

[R MLO]
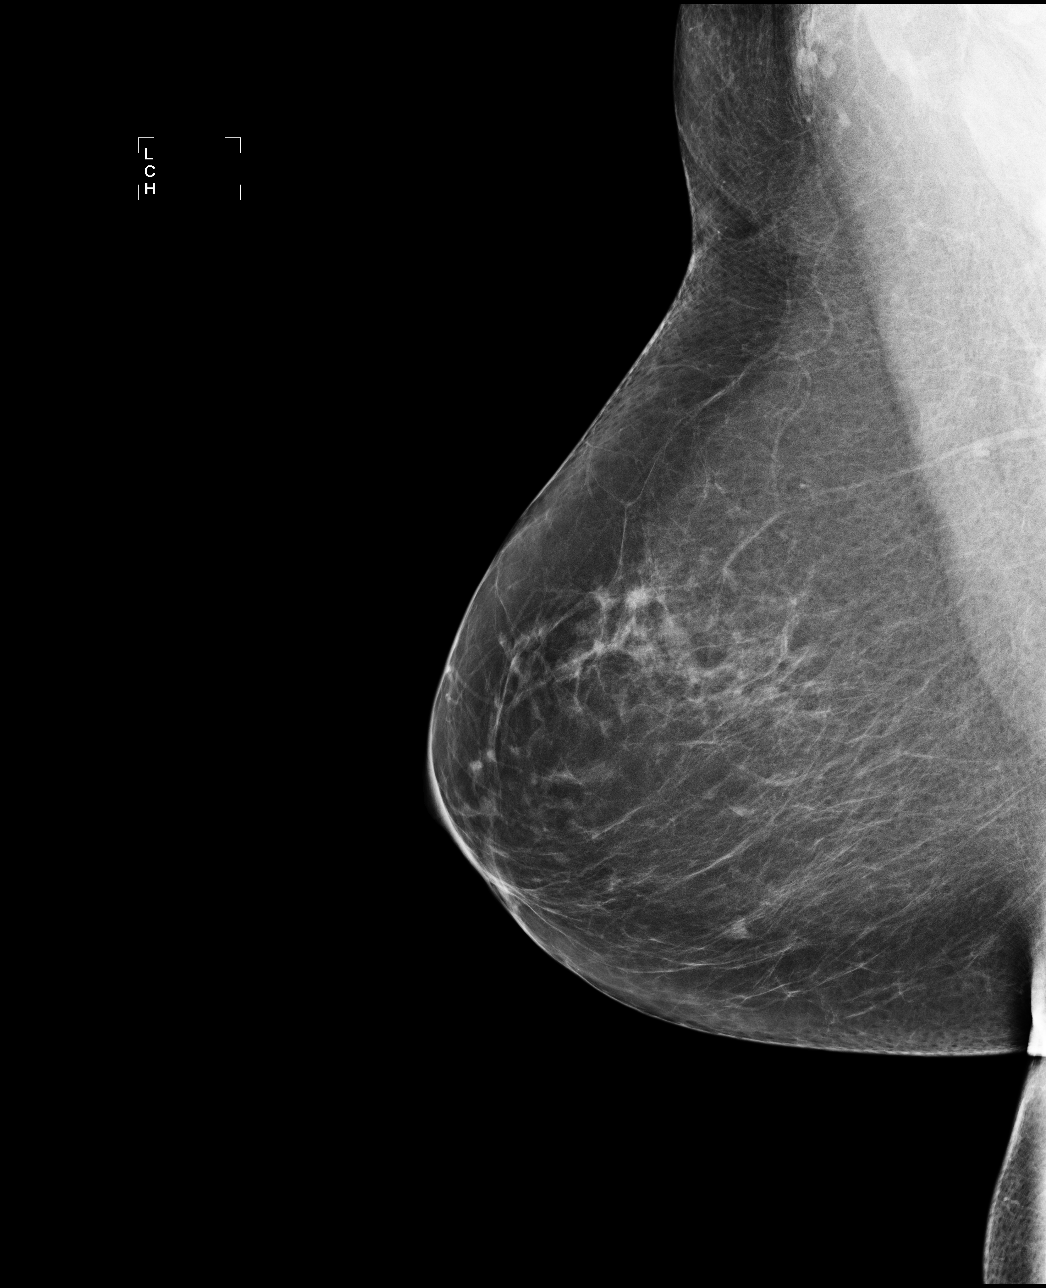

[L CC]
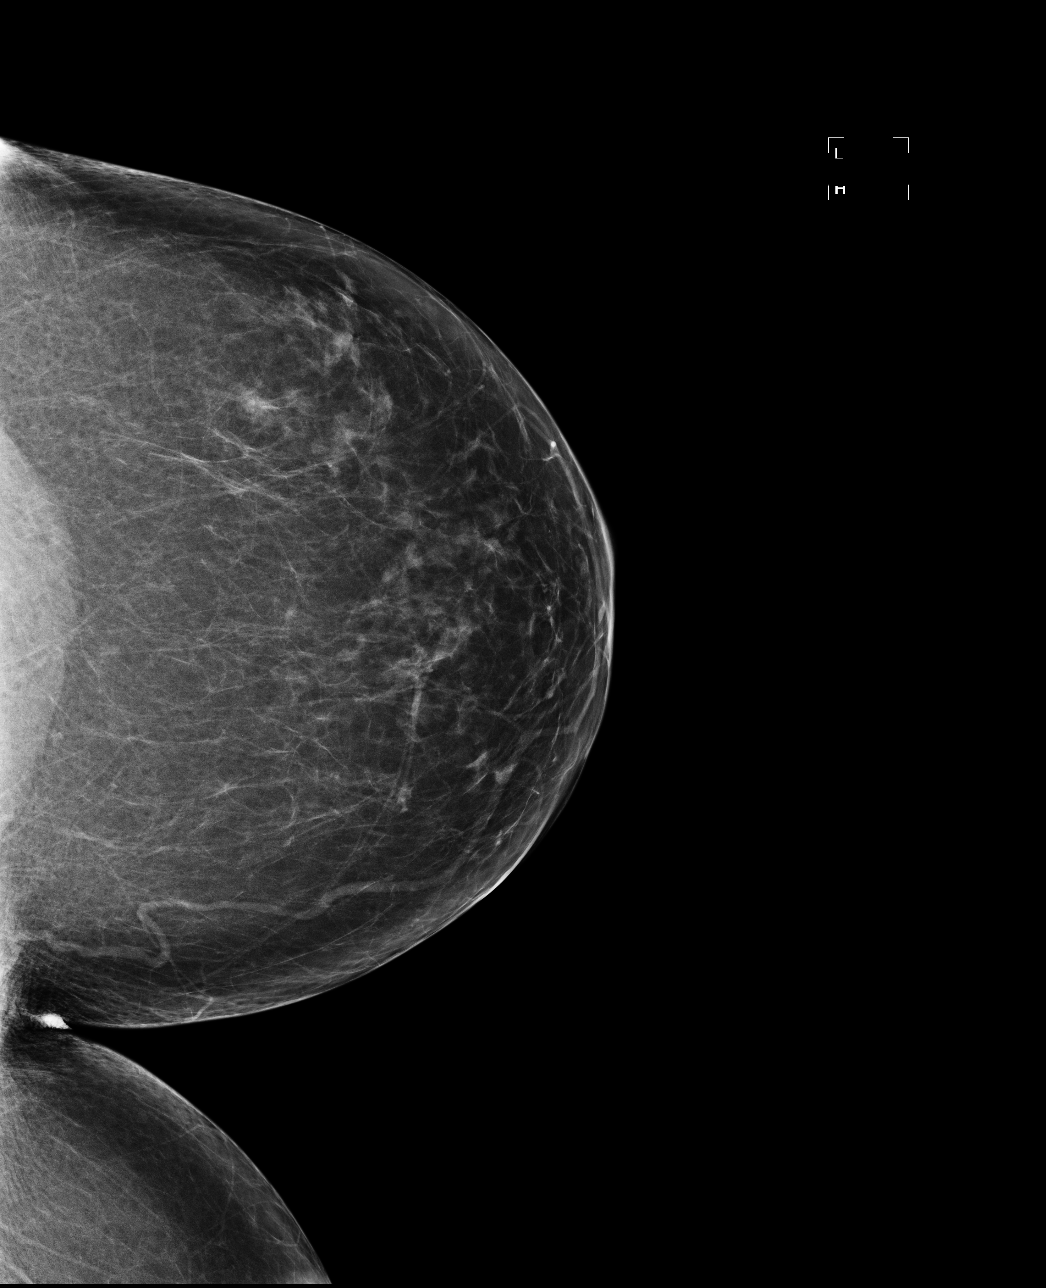

[L MLO]
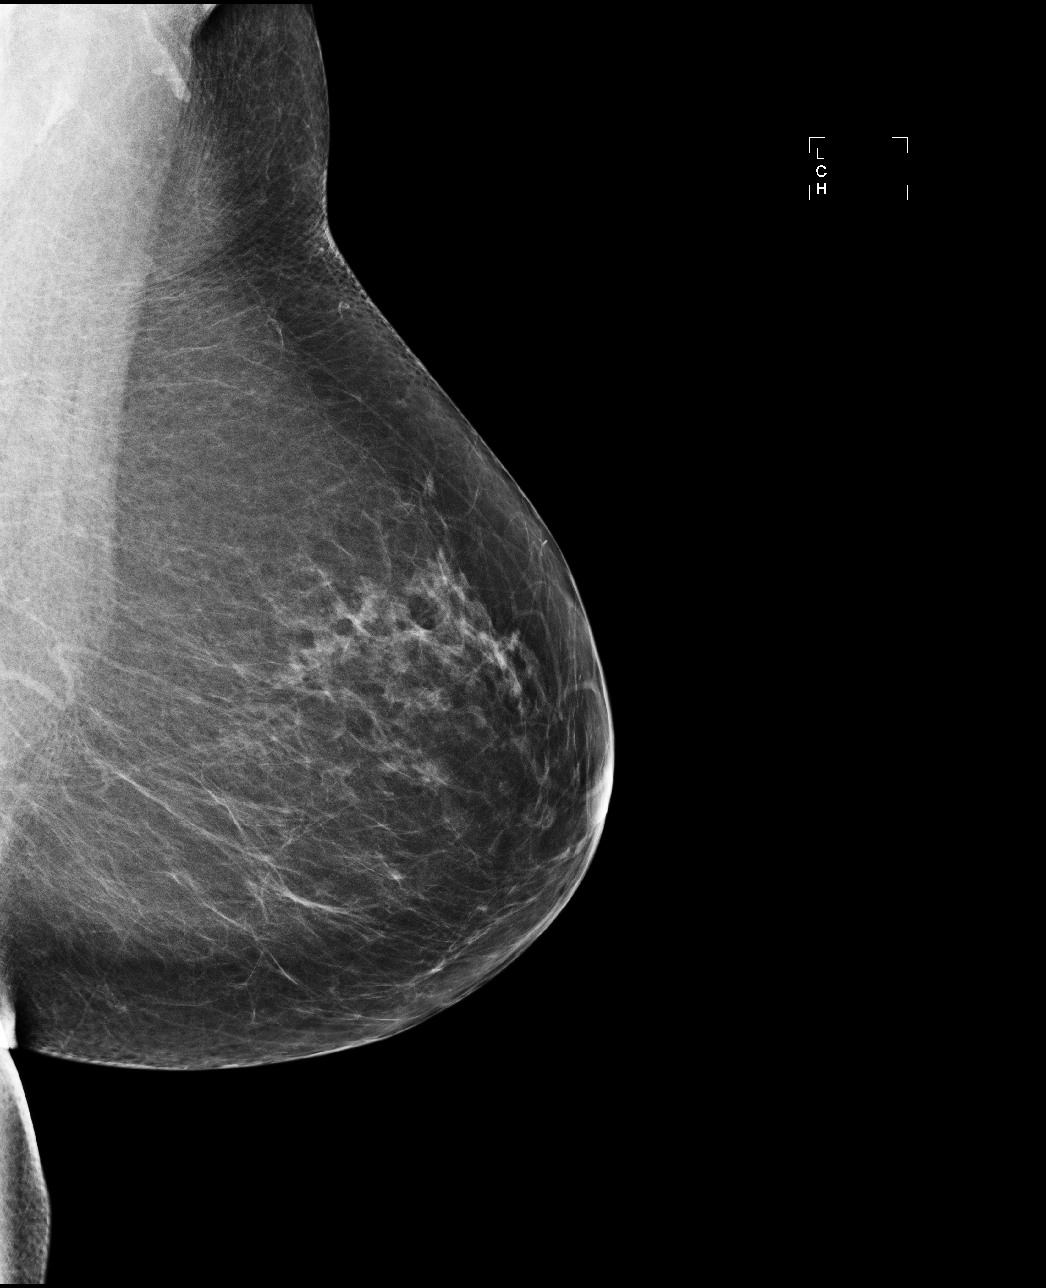

[4 of 4 positions shown; findings below may reference images not displayed]

IMPRESSION: No specific mammographic evidence of malignancy.  Next screening mammogram is recommended in one 
year.

A result letter of this screening mammogram will be mailed directly to the patient.

ASSESSMENT: Negative - BI-RADS 1

Screening mammogram in 1 year.
,

## 2010-07-29 ENCOUNTER — Encounter: Payer: Self-pay | Admitting: Internal Medicine

## 2010-10-14 ENCOUNTER — Ambulatory Visit: Payer: Self-pay | Admitting: Internal Medicine

## 2010-10-28 ENCOUNTER — Ambulatory Visit: Payer: Self-pay | Admitting: Obstetrics & Gynecology

## 2010-11-18 ENCOUNTER — Ambulatory Visit: Payer: Self-pay | Admitting: Internal Medicine

## 2010-12-19 ENCOUNTER — Ambulatory Visit
Admission: RE | Admit: 2010-12-19 | Discharge: 2010-12-19 | Payer: Self-pay | Source: Home / Self Care | Attending: Family Medicine | Admitting: Family Medicine

## 2010-12-19 LAB — CONVERTED CEMR LAB
Bilirubin Urine: NEGATIVE
Blood in Urine, dipstick: NEGATIVE
Glucose, Urine, Semiquant: NEGATIVE
Ketones, urine, test strip: NEGATIVE
Nitrite: NEGATIVE
Protein, U semiquant: NEGATIVE
Specific Gravity, Urine: <1.005
Urobilinogen, UA: 0.2
pH: 7

## 2010-12-20 ENCOUNTER — Encounter: Payer: Self-pay | Admitting: Family Medicine

## 2010-12-23 ENCOUNTER — Ambulatory Visit
Admission: RE | Admit: 2010-12-23 | Discharge: 2010-12-23 | Payer: Self-pay | Source: Home / Self Care | Attending: Family Medicine | Admitting: Family Medicine

## 2010-12-23 DIAGNOSIS — J069 Acute upper respiratory infection, unspecified: Secondary | ICD-10-CM | POA: Insufficient documentation

## 2010-12-23 DIAGNOSIS — N760 Acute vaginitis: Secondary | ICD-10-CM | POA: Insufficient documentation

## 2011-01-05 LAB — CONVERTED CEMR LAB
ALT: 15 units/L (ref 0–35)
Alkaline Phosphatase: 64 units/L (ref 39–117)
Basophils Relative: 0.4 % (ref 0.0–3.0)
Bilirubin, Direct: 0.1 mg/dL (ref 0.0–0.3)
CO2: 31 meq/L (ref 19–32)
Creatinine, Ser: 0.8 mg/dL (ref 0.4–1.2)
Eosinophils Relative: 5.5 % — ABNORMAL HIGH (ref 0.0–5.0)
Glucose, Bld: 96 mg/dL (ref 70–99)
Lymphocytes Relative: 31.5 % (ref 12.0–46.0)
Monocytes Relative: 3 % (ref 3.0–12.0)
Neutrophils Relative %: 59.6 % (ref 43.0–77.0)
RBC: 4.27 M/uL (ref 3.87–5.11)
Sodium: 141 meq/L (ref 135–145)
Total Protein: 5.9 g/dL — ABNORMAL LOW (ref 6.0–8.3)
WBC: 5.3 10*3/uL (ref 4.5–10.5)

## 2011-01-09 NOTE — Assessment & Plan Note (Signed)
Summary: STILL HAVING BURNING & IRRITATION IN VAGINAL AREA / LFW   Vital Signs:  Patient profile:   70 year old female Weight:      123.75 pounds Temp:     98.0 degrees F oral Pulse rate:   76 / minute Pulse rhythm:   regular BP sitting:   132 / 70  (left arm) Cuff size:   regular  Vitals Entered By: Maudie Mercury Dance CMA Deborra Medina) (December 23, 2010 11:39 AM) CC: Vaginal burning,irritation and cough   History of Present Illness: 70 yo with h/o lichen sclerosis here for follow up dyysuria now with vaginal irritation and cough.  dysuria- saw Dr. Damita Dunnings on 1/12.  Urine culture- insignificant growth.  Did feel better s/p 3 days of Bactrim DS bid.    Vaginal irritation-:  h/o lichen sclerosis.  Uses estrogen cream and clobetasol topically. Vaginal irritation has persisted despite her dysuria improving.  Has noticed some vaginal discharge as well.  Irritation is not relieved by the Clobetasol the way it normally is.  Also vulvu is not as beefy red is it tends to be the lichen sclerosis.    Cough- started a few days ago.  Mildly prodcutive, some nasal discharge.  No fevers or chills.  Just finished 3 day course of Bactrim for UTI.    Current Medications (verified): 1)  Pravastatin Sodium 20 Mg Tabs (Pravastatin Sodium) .Marland Kitchen.. 1 Tab Daily To Reduce Cholesterol 2)  Clorazepate Dipotassium 7.5 Mg Tabs (Clorazepate Dipotassium) .Marland Kitchen.. 1 Two Times A Day As Needed For Anxiety or Irritable Bowel 3)  Clobetasol Propionate 0.05 % Oint (Clobetasol Propionate) .... Apply Every Other Night 4)  Estrace 0.1 Mg/gm Crea (Estradiol) .... Apply Every Other Night 5)  Melatonin 1 Mg Caps (Melatonin) .... As Needed 6)  Stool Softener 100 Mg Caps (Docusate Sodium) .... As Needed 7)  Fish Oil 1000 Mg Caps (Omega-3 Fatty Acids) .... Take 2 By Mouth Once Daily 8)  Calcium-Vitamin D 250-125 Mg-Unit Tabs (Calcium Carbonate-Vitamin D) .... Take 1 By Mouth Once Daily 9)  Multivitamins   Tabs (Multiple Vitamin) .... Take 1 Tablet By  Mouth Once A Day 10)  Diflucan 150 Mg Tabs (Fluconazole) .... Once Daily 11)  Tussionex Pennkinetic Er 8-10 Mg/68m Lqcr (Chlorpheniramine-Hydrocodone) .... 5 Ml By Mouth At Bedtime As Needed Cough  Allergies: 1)  ! Clonazepam (Clonazepam) 2)  ! Penicillin  Past History:  Past Medical History: Last updated: 09/26/2009 Anxiety GERD----------------------------------------------------------Dr BOlevia PerchesHyperlipidemia------------------------------------------------Dr Stuckey Osteopenia 2005 Osteoarthritis IBS  Past Surgical History: Last updated: 0Aug 15, 2008Cardiac cath--okay, mild changes  1997 T&A  1953 Cholecystectomy  1992 Hysterectomy  1983 Appendectomy 1983 Adenosine cardiolite  neg ishemia  07/2002  Family History: Last updated: 008/15/08Dad died @48   MI, DM Mom died @71  COPD 1 brother died @51  pulmonary fibrosis CAD in Dad HTN in Mom and sister DM in Dad Breast cancer in daughter No colon cancer  Social History: Last updated: 08/04/2008 Marital Status: Married Children: 2 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 ENT:  Complains of nasal congestion. CV:  Denies chest pain or discomfort. Resp:  Complains of cough. GU:  Complains of discharge; denies dysuria.  Physical Exam  General:  no apparent distress normocephalic atraumatic regular rate and rhythm clear to auscultation bilaterally abdominal exam: soft, not tender to palpation no  cva pain ext w/o edema Genitalia:  Pelvic Exam:        External: normal female genitalia, red plaques snaller in number, less erythema, thick white discharge in vault        Vagina: normal without lesions or masses             Adnexa: normal bimanual exam without masses or fullness        Uterus: normal by palpation  Psych:   normally interactive, good eye contact, not anxious appearing, and not depressed appearing.     Impression & Recommendations:  Problem # 1:  UNSPECIFIED VAGINITIS AND VULVOVAGINITIS (ICD-616.10) Assessment Deteriorated Wet prep consistent with yeast infection.  Will treat with Diflucan, hopefully symptoms will improve. If not, will refer back to GYN for further treatment and evaluation given h/o lichen sclerosis (biopsy confirmed). The following medications were removed from the medication list:    Septra Ds 800-160 Mg Tabs (Sulfamethoxazole-trimethoprim) .Marland Kitchen... 1 by mouth two times a day  Orders: Wet Prep ([id number])  Problem # 2:  DYSURIA (ICD-788.1) Assessment: Improved improved s/p 3 day course of bactrim.  Willl not repeat UA today since symptoms improved and culture showed insignificant growth.   The following medications were removed from the medication list:    Septra Ds 800-160 Mg Tabs (Sulfamethoxazole-trimethoprim) .Marland Kitchen... 1 by mouth two times a day  Problem # 3:  URI (ICD-465.9) Assessment: New Likely viral, advised supportive care as per pt instruction. Tussionex at night for coughing. Her updated medication list for this problem includes:    Tussionex Pennkinetic Er 8-10 Mg/60m Lqcr (Chlorpheniramine-hydrocodone) ..Marland KitchenMarland KitchenMarland KitchenMarland Kitchen5 ml by mouth at bedtime as needed cough  Complete Medication List: 1)  Pravastatin Sodium 20 Mg Tabs (Pravastatin sodium) ..Marland Kitchen. 1 tab daily to reduce cholesterol 2)  Clorazepate Dipotassium 7.5 Mg Tabs (Clorazepate dipotassium) ..Marland Kitchen. 1 two times a day as needed for anxiety or irritable bowel 3)  Clobetasol Propionate 0.05 % Oint (Clobetasol propionate) .... Apply every other night 4)  Estrace 0.1 Mg/gm Crea (Estradiol) .... Apply every other night 5)  Melatonin 1 Mg Caps (Melatonin) .... As needed 6)  Stool Softener 100 Mg Caps (Docusate sodium) .... As needed 7)  Fish Oil 1000 Mg Caps (Omega-3 fatty acids) .... Take 2 by mouth once daily 8)  Calcium-vitamin  D 250-125 Mg-unit Tabs (Calcium carbonate-vitamin d) .... Take 1 by mouth once daily 9)  Multivitamins Tabs (Multiple vitamin) .... Take 1 tablet by mouth once a day 10)  Diflucan 150 Mg Tabs (Fluconazole) .... Once daily 11)  Tussionex Pennkinetic Er 8-10 Mg/515mLqcr (Chlorpheniramine-hydrocodone) .... 5 ml by mouth at bedtime as needed cough Prescriptions: TUSSIONEX PENNKINETIC ER 8-10 MG/5ML LQCR (CHLORPHENIRAMINE-HYDROCODONE) 5 ml by mouth at bedtime as needed cough  #5 ounces x 0   Entered and Authorized by:   TaArnette NorrisD   Signed by:   TaArnette NorrisD on 12/23/2010   Method used:   Print then Give to Patient   RxID:   [id number] 150 MG TABS (FLUCONAZOLE) once daily  #1 x 0   Entered and Authorized by:   TaArnette NorrisD   Signed by:   TaArnette NorrisD on 12/23/2010   Method used:   Electronically to        [facility]       [address]     [place]  [address]  Ph: [phone]       Fax: [phone]   RxID:   [id number]    Orders Added: 1)  Wet Prep [[id number]] 2)  Est. Patient Level IV [85501]    Current Allergies (reviewed today): ! CLONAZEPAM (CLONAZEPAM) ! PENICILLIN  Laboratory Results    Wet [place]/KOH Source: bacterial WBC/hpf 1-5 Clue cells/hpf none  Negative whiff Yeast/hpf moderate Trichomonas/hpf none   Appended Document: STILL HAVING BURNING & IRRITATION IN VAGINAL AREA / LFW

## 2011-01-09 NOTE — Letter (Signed)
Summary: Results Follow up Letter  [name] at [facility]  [address] [place], [place] [address]   Phone: [phone]  Fax: [phone]    12/19/2009 MRN: [id number]     [facility] [address] [place], [place]  [address]    Dear Ms. Wilmott,  The following are the results of your recent test(s):  Test         Result    Pap Smear:        Normal ___x__  Not Normal _____ Comments:Repeat in 1 year ______________________________________________________ Cholesterol: LDL(Bad cholesterol):         Your goal is less than:         HDL (Good cholesterol):       Your goal is more than: Comments:  ______________________________________________________ Mammogram:        Normal _____  Not Normal _____ Comments:  ___________________________________________________________________ Hemoccult:        Normal _____  Not normal _______ Comments:    _____________________________________________________________________ Other Tests:    We routinely do not discuss normal results over the telephone.  If you desire a copy of the results, or you have any questions about this information we can discuss them at your next office visit.   Sincerely,  Arnette Norris MD

## 2011-01-09 NOTE — Assessment & Plan Note (Signed)
Summary: ROA FOR 6 MTH F/UP/JRR R/S FROM 10/11   Vital Signs:  Patient profile:   70 year old female Weight:      126 pounds BMI:     21.04 Temp:     97.8 degrees F oral Pulse rate:   72 / minute Pulse rhythm:   regular BP sitting:   128 / 68  (left arm) Cuff size:   regular  Vitals Entered By: Edwin Dada CMA Deborra Medina) (October 14, 2010 10:39 AM) CC: 6 month follow-up   History of Present Illness: Doing okay  Has noticed a lot of gas--able to pass it so not really getting cramps No clear indication why--in terms of food. May start after eating but last all day Drinks milk and eats yogurt regularlly ( thinks this helps her intestines) No sugarless gums or candies Has tried gas-x prevention--no help Takes stool softener to keep regular---still hard  No sig acid reflux--or rarely hasn't needed meds fish oil does cause some bad taste but no clear reflux  Tolerating the pravastatin no problems  No sig issues with anxiety still has sleep problems--uses the chlorazepate  a few times a week--makes sure not to take it every night has tried melatonin to help stay asleep--didn't help at low dose Occ uses acetominophen or ibuprofen PM--helps her some without AM sedation  back stiff in the morning eases up quickly still works every other day and does fine with this  Allergies: 1)  ! Clonazepam (Clonazepam)  Past History:  Past medical, surgical, family and social histories (including risk factors) reviewed for relevance to current acute and chronic problems.  Past Medical History: Reviewed history from 09/26/2009 and no changes required. Anxiety GERD----------------------------------------------------------Dr [name] Hyperlipidemia------------------------------------------------Dr Lia Foyer Osteopenia 2005 Osteoarthritis IBS  Past Surgical History: Reviewed history from 07/08/2007 and no changes required. Cardiac cath--okay, mild changes  1997 T&A   1953 Cholecystectomy  1992 Hysterectomy  1983 Appendectomy 1983 Adenosine cardiolite  neg ishemia  07/2002  Family History: Reviewed history from 07/08/2007 and no changes required. Dad died @48   MI, DM Mom died @71  COPD 1 brother died @51  pulmonary fibrosis CAD in Dad HTN in Mom and sister DM in Dad Breast cancer in daughter No colon cancer  Social History: Reviewed history from 08/04/2008 and no changes required. 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   Neck:  supple, no masses, no thyromegaly, no carotid bruits, and no cervical lymphadenopathy.   Lungs:  normal respiratory effort, no intercostal retractions, no accessory muscle use, and normal breath sounds.   Heart:  normal rate, regular rhythm, no murmur, and no gallop.   Abdomen:  soft, non-tender, normal bowel sounds, and no masses.  Mild sensitivity but no tenderness Msk:  no joint tenderness and no joint swelling.   Extremities:  trace right calf edema none on left Psych:  normally interactive, good eye contact, not anxious appearing, and not depressed appearing.     Impression & Recommendations:  Problem # 1:  IBS (ICD-564.1) Assessment Unchanged ongoing gas will have her try lactaid milk OTC gas meds otherwise  Problem # 2:  ANXIETY (ICD-300.00) Assessment: Unchanged mostly just for sleep initiates but doesn't maintain discussed  her regimen  Her updated medication list for this problem includes:    Clorazepate Dipotassium 7.5 Mg Tabs (Clorazepate dipotassium) .Marland Kitchen... 1 two times a day as needed for anxiety or irritable bowel  Problem # 3:  HYPERLIPIDEMIA  (ICD-272.4) Assessment: Unchanged  doing well on the med  Her updated medication list for this problem includes:    Pravastatin Sodium 20 Mg Tabs (Pravastatin sodium) .Marland Kitchen... 1 tab daily to reduce cholesterol  Labs Reviewed: SGOT: 20 (05/17/2010)   SGPT: 16 (05/17/2010)   HDL:49.10 (05/17/2010), 50.2 (07/23/2007)  LDL:122 (05/17/2010), 113 (07/23/2007)  Chol:200 (05/17/2010), 182 (07/23/2007)  Trig:143.0 (05/17/2010), 92 (07/23/2007)  Problem # 4:  OSTEOARTHRITIS (ICD-715.90) Assessment: Unchanged gets AM pain uses OTC meds  Complete Medication List: 1)  Pravastatin Sodium 20 Mg Tabs (Pravastatin sodium) .Marland Kitchen.. 1 tab daily to reduce cholesterol 2)  Clorazepate Dipotassium 7.5 Mg Tabs (Clorazepate dipotassium) .Marland Kitchen.. 1 two times a day as needed for anxiety or irritable bowel 3)  Clobetasol Propionate 0.05 % Oint (Clobetasol propionate) .... Apply every other night 4)  Estrace 0.1 Mg/gm Crea (Estradiol) .... Apply every other night 5)  Melatonin 1 Mg Caps (Melatonin) .... As needed 6)  Stool Softener 100 Mg Caps (Docusate sodium) .... As needed 7)  Fish Oil 1000 Mg Caps (Omega-3 fatty acids) .... Take 2 by mouth once daily 8)  Calcium-vitamin D 250-125 Mg-unit Tabs (Calcium carbonate-vitamin d) .... Take 1 by mouth once daily 9)  Multivitamins Tabs (Multiple vitamin) .... Take 1 tablet by mouth once a day  Patient Instructions: 1)  Please try lactaid milk to see if that helps your gas. 2)  Please schedule a follow-up appointment in 6 months for annual wellness visit   Orders Added: 1)  Est. Patient Level IV [16109]   Immunization History:  Influenza Immunization History:    Influenza:  historical (10/01/2010)   Immunization History:  Influenza Immunization History:    Influenza:  Historical (10/01/2010)  Current Allergies (reviewed today): ! CLONAZEPAM (CLONAZEPAM)

## 2011-01-09 NOTE — Assessment & Plan Note (Signed)
Summary: ? 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 Clonazepam (Clonazepam)  Past History:  Past medical, surgical, family and social histories (including risk factors) reviewed for relevance to current acute and chronic problems.  Past Medical History: Reviewed history from 09/26/2009 and no changes required. Anxiety GERD----------------------------------------------------------Dr [name] Hyperlipidemia------------------------------------------------Dr Lia Foyer Osteopenia 2005 Osteoarthritis IBS  Past Surgical History: Reviewed history from 07/08/2007 and no changes required. Cardiac cath--okay, mild changes  1997 T&A  1953 Cholecystectomy  1992 Hysterectomy  1983 Appendectomy 1983 Adenosine cardiolite  neg ishemia  07/2002  Family History: Reviewed history from 07/08/2007 and no changes required. Dad died @48   MI, DM Mom died @71  COPD 1 brother died @51  pulmonary fibrosis CAD in Dad HTN in Mom and sister DM in Dad Breast cancer in daughter No colon cancer  Social History: Reviewed history from 08/04/2008 and no changes required. 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   Skin:  clustered blisters along volar left forearm just proximal to the wrist covers about 6-8cm x 3cm  No inflammation or drainage   Impression & Recommendations:  Problem # 1:  CONTACT DERMATITIS&OTHER ECZEMA DUE TO PLANTS (ICD-692.6) Assessment New about 1 week out now and very locaiized so will not use prednisone try cortisone cream to see if it helps without the burning antihistamines for the itching  Her updated medication list for this problem includes:    Clobetasol Propionate 0.05 % Oint (Clobetasol propionate) .Marland Kitchen... Apply every other night  Complete Medication List: 1)  Pravastatin Sodium 20 Mg Tabs (Pravastatin sodium) .Marland Kitchen.. 1 tab daily to reduce cholesterol 2)  Clorazepate Dipotassium 7.5 Mg Tabs (Clorazepate dipotassium) .Marland Kitchen.. 1 two times a day as needed for anxiety or irritable bowel 3)  Clobetasol Propionate 0.05 % Oint (Clobetasol propionate) .... Apply every other night 4)  Estrace 0.1 Mg/gm Crea (Estradiol) .... Apply every other night 5)  Melatonin 1 Mg Caps (Melatonin) .... As needed 6)  Stool Softener 100 Mg Caps (Docusate sodium) .... As needed 7)  Fish Oil 1000 Mg Caps (Omega-3 fatty acids) .... Take 2 by mouth once daily 8)  Calcium-vitamin D 250-125 Mg-unit Tabs (Calcium carbonate-vitamin d) .... Take 1 by mouth once daily 9)  Multivitamins Tabs (Multiple vitamin) .... Take 1 tablet by mouth once a day  Patient Instructions: 1)  Please try the clobetasol cream on the blisters 2)  Use the fexofenadine for the itching. If it persists, you can add cetirizine 38m daily (zyrtec) or loratadine 167m1-2 daily (  claritin) 3)  Please keep regular appt   Orders Added: 1)  Est. Patient Level III [94129]    Current Allergies (reviewed today): ! CLONAZEPAM (CLONAZEPAM)

## 2011-01-09 NOTE — Assessment & Plan Note (Signed)
Summary: 38 M F/U DLO   Vital Signs:  Patient profile:   70 year old female Weight:      124 pounds Temp:     98.4 degrees F oral Pulse rate:   72 / minute Pulse rhythm:   regular BP sitting:   130 / 80  (left arm) Cuff size:   regular  Vitals Entered By: Edwin Dada CMA Deborra Medina) (April 02, 2010 9:04 AM) CC: 6 month follow-up   History of Present Illness: Doing okay in general  Still has vaginal abnormality  Burning, etc are better with clobetasol weekly and lubricant Biopsy inconclusive and seeing Dr Kennon Rounds again today to repeat biopsy  discussed the cholesterol again She is concerned about primary prevention due to dad's history Never did recheck off the medicine Has some leg cramps--but these are no different off the statin  Has some spells of anxiety uses the med when her stomach acts up--may by the manifestation of her anxiety It also helps her rest at night  No sig heartburn but does get the "rolling" in abdomen after eating Gets burping at times Doesn't run to bathroom after the symptoms though    Allergies: 1)  ! Clonazepam (Clonazepam)  Past History:  Past medical, surgical, family and social histories (including risk factors) reviewed for relevance to current acute and chronic problems.  Past Medical History: Reviewed history from 09/26/2009 and no changes required. Anxiety GERD----------------------------------------------------------Dr [name] Hyperlipidemia------------------------------------------------Dr Lia Foyer Osteopenia 2005 Osteoarthritis IBS  Past Surgical History: Reviewed history from 07/08/2007 and no changes required. Cardiac cath--okay, mild changes  1997 T&A  1953 Cholecystectomy  1992 Hysterectomy  1983 Appendectomy 1983 Adenosine cardiolite  neg ishemia  07/2002  Family History: Reviewed history from 07/08/2007 and no changes required. Dad died @48   MI, DM Mom died @71  COPD 1 brother died @51  pulmonary fibrosis CAD in  Dad HTN in Mom and sister DM in Dad Breast cancer in daughter No colon cancer  Social History: Reviewed history from 08/04/2008 and no changes required. Marital Status: Married Children: 2 Occupation: retired Arts development officer part Dentist asst Never Smoked Alcohol use-no tries to exercise at least twice weekly  Review of Systems       Mild depressed mood at times but nothing persistent appetite is okay weight fairly stable Wears brace for right foot at times still--chronic mild swelling  Physical Exam  General:  alert and normal appearance.   Neck:  supple, no masses, no thyromegaly, no carotid bruits, and no cervical lymphadenopathy.   Lungs:  normal respiratory effort and normal breath sounds.   Heart:  normal rate, regular rhythm, no murmur, and no gallop.   Abdomen:  soft and non-tender.   Msk:  no joint tenderness and no joint swelling.   Pulses:  faint in feet Extremities:  mild puffiness at right ankle Skin:  small actinic on left cheek (near preauricular area) Psych:  normally interactive, good eye contact, not anxious appearing, and not depressed appearing.     Impression & Recommendations:  Problem # 1:  HYPERLIPIDEMIA (XKG-818.4) Assessment Comment Only  does want to use primary prevention due to Dad's early MI will check labs in 4-6 weeks  Her updated medication list for this problem includes:    Pravastatin Sodium 20 Mg Tabs (Pravastatin sodium) .Marland Kitchen... 1 tab daily to reduce cholesterol  Orders: Prescription Created Electronically [id number])  Problem # 2:  ANXIETY (ICD-300.00) Assessment: Unchanged does okay with the medicine --mostly to relax her for sleep  Her updated medication list  for this problem includes:    Clorazepate Dipotassium 7.5 Mg Tabs (Clorazepate dipotassium) .Marland Kitchen... 1 two times a day as needed for anxiety or irritable bowel  Problem # 3:  IBS (ICD-564.1) Assessment: Unchanged mild symptoms not classic No Rx needed other than the  clorazepate at times  Problem # 4:  ACTINIC KERATOSIS (ICD-702.0) Assessment: New Liquid nitrogen x 40 seconds x 2 tolerated well and discussed home care  Orders: Cryotherapy/Destruction benign or premalignant lesion (1st lesion)  (17000)  Complete Medication List: 1)  Multivitamins Tabs (Multiple vitamin) .... Take 1 tablet by mouth once a day 2)  Clorazepate Dipotassium 7.5 Mg Tabs (Clorazepate dipotassium) .Marland Kitchen.. 1 two times a day as needed for anxiety or irritable bowel 3)  Stool Softener 100 Mg Caps (Docusate sodium) .... As needed 4)  Fish Oil 1000 Mg Caps (Omega-3 fatty acids) .... Take 1 by mouth once daily 5)  Calcium-vitamin D 250-125 Mg-unit Tabs (Calcium carbonate-vitamin d) .... Take 1 by mouth once daily 6)  Clobetasol Propionate 0.05 % Oint (Clobetasol propionate) .... Apply to area nightly. 7)  Pravastatin Sodium 20 Mg Tabs (Pravastatin sodium) .Marland Kitchen.. 1 tab daily to reduce cholesterol  Patient Instructions: 1)  Please schedule a follow-up appointment in 6 months .  2)  Please set up blood work in 4-6 weeks (lipid, hepatic--272.4) Prescriptions: PRAVASTATIN SODIUM 20 MG TABS (PRAVASTATIN SODIUM) 1 tab daily to reduce cholesterol  #90 x 3   Entered and Authorized by:   Claris Gower MD   Signed by:   Claris Gower MD on 04/02/2010   Method used:   Electronically to        [facility] (retail)       [place], [place]  [address]       Ph: [phone]       Fax: [phone]   RxID:   [id number] CLORAZEPATE DIPOTASSIUM 7.5 MG TABS (CLORAZEPATE DIPOTASSIUM) 1 two times a day as needed for anxiety or irritable bowel  #60 x 1   Entered and Authorized by:   Claris Gower MD   Signed by:   Claris Gower MD on 04/02/2010   Method used:   Print then Give to Patient   RxID:   [id number]   Current Allergies (reviewed today): ! CLONAZEPAM (CLONAZEPAM)

## 2011-01-09 NOTE — Assessment & Plan Note (Signed)
Summary: ?UTI/CLE   Vital Signs:  Patient profile:   70 year old female Height:      65 inches Weight:      125.75 pounds Temp:     98.2 degrees F oral Pulse rate:   88 / minute Pulse rhythm:   regular BP sitting:   134 / 74  (left arm) Cuff size:   regular  Vitals Entered By: Christena Deem CMA Deborra Medina) (December 19, 2010 12:22 PM) CC: ? UTI   History of Present Illness: Dysuria:  h/o lichen sclerosis.  Uses estrogen cream and clobetasol topically.  1 week of pain with urination and pressure "like I had to go to the bathroom."  Sx didn't resolve and Tuesday night she saw some this discharge in the vaginal area.  Stream is changed from before- narrower stream of urine.  She feels buring at distal urethra.  Area is more sensitive than normal.  "This feels like a UTI." No FCV.  Occ nausea.  She checked for discharge this AM and none was present.    Allergies: 1)  ! Clonazepam (Clonazepam) 2)  ! Penicillin  Review of Systems       See HPI.  Otherwise negative.    Physical Exam  General:  no apparent distress normocephalic atraumatic regular rate and rhythm clear to auscultation bilaterally abdominal exam: soft, not tender to palpation no cva pain ext w/o edema   Impression & Recommendations:  Problem # 1:  DYSURIA (ICD-788.1) I talked to her about options.  She has no more discharge.  No sign of pyelo.  I would tx for presumed cystitis and check cx in meantime.  follow up is symptoms persist.  She agrees.   Orders: Prescription Created Electronically [id number]) Specimen Handling (99000) T-Culture, Urine ([id number])  Her updated medication list for this problem includes:    Septra Ds 800-160 Mg Tabs (Sulfamethoxazole-trimethoprim) .Marland Kitchen... 1 by mouth two times a day  Complete Medication List: 1)  Pravastatin Sodium 20 Mg Tabs (Pravastatin sodium) .Marland Kitchen.. 1 tab daily to reduce cholesterol 2)  Clorazepate Dipotassium 7.5 Mg Tabs (Clorazepate dipotassium) .Marland Kitchen.. 1 two times a day as  needed for anxiety or irritable bowel 3)  Clobetasol Propionate 0.05 % Oint (Clobetasol propionate) .... Apply every other night 4)  Estrace 0.1 Mg/gm Crea (Estradiol) .... Apply every other night 5)  Melatonin 1 Mg Caps (Melatonin) .... As needed 6)  Stool Softener 100 Mg Caps (Docusate sodium) .... As needed 7)  Fish Oil 1000 Mg Caps (Omega-3 fatty acids) .... Take 2 by mouth once daily 8)  Calcium-vitamin D 250-125 Mg-unit Tabs (Calcium carbonate-vitamin d) .... Take 1 by mouth once daily 9)  Multivitamins Tabs (Multiple vitamin) .... Take 1 tablet by mouth once a day 10)  Septra Ds 800-160 Mg Tabs (Sulfamethoxazole-trimethoprim) .Marland Kitchen.. 1 by mouth two times a day  Other Orders: UA Dipstick w/o Micro (manual) (81448)  Patient Instructions: 1)  Start the antibiotics today and let us know if you aren't improving.  We'll contact you with your lab report.  Take care.  Prescriptions: SEPTRA DS 800-160 MG TABS (SULFAMETHOXAZOLE-TRIMETHOPRIM) 1 by mouth two times a day  #6 x 0   Entered and Authorized by:   Elsie Stain MD   Signed by:   Elsie Stain MD on 12/19/2010   Method used:   Electronically to        [facility] (retail)       [place], [address]  [place], [place]  [address]       Ph: [phone]       Fax: [phone]   RxID:   [id number]    Orders Added: 1)  UA Dipstick w/o Micro (manual) [81002] 2)  Prescription Created Electronically [[id number]] 3)  Est. Patient Level III [06237] 4)  Specimen Handling [99000] 5)  T-Culture, Urine [[id number]]    Laboratory Results   Urine Tests  Date/Time Received: December 19, 2010 12:24 PM   Routine Urinalysis   Color: yellow Appearance: Hazy Glucose: negative   (Normal Range: Negative) Bilirubin: negative   (Normal Range: Negative) Ketone: negative   (Normal Range: Negative) Spec. Gravity: <1.005   (Normal Range: 1.003-1.035) Blood: negative   (Normal Range:  Negative) pH: 7.0   (Normal Range: 5.0-8.0) Protein: negative   (Normal Range: Negative) Urobilinogen: 0.2   (Normal Range: 0-1) Nitrite: negative   (Normal Range: Negative) Leukocyte Esterace: small   (Normal Range: Negative)

## 2011-01-09 NOTE — Assessment & Plan Note (Signed)
Summary: 3 M F/U DLO   Vital Signs:  Patient profile:   70 year old female Height:      65 inches Weight:      126.25 pounds BMI:     21.09 Temp:     98.2 degrees F oral Pulse rate:   84 / minute Pulse rhythm:   regular BP sitting:   126 / 70  (left arm) Cuff size:   regular  Vitals Entered By: Christena Deem CMA Deborra Medina) (December 12, 2009 3:39 PM) CC: 2 months follow up   History of Present Illness: Pt here for follow up of vaginal irritation.  I had been seeing her for what I felt was Lichen planus but sent her to Gyn last month for biopsy to rule out malignancy and confirm diagnosis.   We were treating her with Clobatesol three times weekly, she is currently using clobatesol twice weekly per Dr. Virginia Crews recommendations and keeping a symptom diary. Twice she had irritation before it was time for her to use the clabetosol again. Dr. Kennon Rounds felt it was more likely lichen sclerosis vs lichen planus but biopsy showed no malignancy. It was inconclusive as to whether it was lichen planus vs sclerosis. Her plan was to continue current therapy and do repeat biopsy 6 weeks from when she saw her (in two weeks).  Overall, she feels her symptoms are much improved.  She is also here today for a pap.  She had a hysterectomy years ago but would like a pelvic, vaginal wall scraping as it has not been done in some time.    Current Medications (verified): 1)  Multivitamins   Tabs (Multiple Vitamin) .... Take 1 Tablet By Mouth Once A Day 2)  Clorazepate Dipotassium 7.5 Mg Tabs (Clorazepate Dipotassium) .Marland Kitchen.. 1 Two Times A Day As Needed For Anxiety or Irritable Bowel 3)  Stool Softener 100 Mg Caps (Docusate Sodium) .... As Needed 4)  Fish Oil 1000 Mg Caps (Omega-3 Fatty Acids) .... Take 1 By Mouth Once Daily 5)  Calcium-Vitamin D 250-125 Mg-Unit Tabs (Calcium Carbonate-Vitamin D) .... Take 1 By Mouth Once Daily 6)  Clobetasol Propionate 0.05 % Oint (Clobetasol Propionate) .... Apply To Area  Nightly.  Allergies: 1)  ! Clonazepam (Clonazepam)  Review of Systems      See HPI GU:  Denies abnormal vaginal bleeding and dysuria.  Physical Exam  General:  alert and normal appearance.   Genitalia:  Pelvic Exam:        External: normal female genitalia, red plaques snaller in number, less erythema        Vagina: normal without lesions or masses             Adnexa: normal bimanual exam without masses or fullness        Uterus: normal by palpation        Pap smear: performed Psych:  normally interactive, good eye contact, not anxious appearing, and not depressed appearing.     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  Follow up with Dr. Kennon Rounds, may need Tacrolimus.  Problem # 2:  SCREENING FOR MALIGNANT NEOPLASM OF THE CERVIX (ICD-V76.2) Pap performed today.  Explained that we can do a pelvic exam every other year or so but she truly does not need a pap after this point.   Orders: Pap Smear, Thin Prep ( Collection of) (P8099)  Complete Medication List: 1)  Multivitamins Tabs (Multiple vitamin) .... Take 1  tablet by mouth once a day 2)  Clorazepate Dipotassium 7.5 Mg Tabs (Clorazepate dipotassium) .Marland Kitchen.. 1 two times a day as needed for anxiety or irritable bowel 3)  Stool Softener 100 Mg Caps (Docusate sodium) .... As needed 4)  Fish Oil 1000 Mg Caps (Omega-3 fatty acids) .... Take 1 by mouth once daily 5)  Calcium-vitamin D 250-125 Mg-unit Tabs (Calcium carbonate-vitamin d) .... Take 1 by mouth once daily 6)  Clobetasol Propionate 0.05 % Oint (Clobetasol propionate) .... Apply to area nightly.  Current Allergies (reviewed today): ! CLONAZEPAM (CLONAZEPAM)

## 2011-01-09 NOTE — Letter (Signed)
Summary: Results Follow up Letter  [place] [facility]  [address] [place], [place] [address]   Phone: [phone]  Fax: [phone]    07/29/2010 MRN: [id number]  [facility] [address] [place], [place]  [address]  Dear Ms. Marinaro,  The following are the results of your recent test(s):  Test         Result    Pap Smear:        Normal _____  Not Normal _____ Comments: ______________________________________________________ Cholesterol: LDL(Bad cholesterol):         Your goal is less than:         HDL (Good cholesterol):       Your goal is more than: Comments:  ______________________________________________________ Mammogram:        Normal __X___  Not Normal _____ Comments:mammo is normal, repeat recommmended in 1-2 years.  ___________________________________________________________________ Hemoccult:        Normal _____  Not normal _______ Comments:    _____________________________________________________________________ Other Tests:    We routinely do not discuss normal results over the telephone.  If you desire a copy of the results, or you have any questions about this information we can discuss them at your next office visit.   Sincerely,      Viviana Simpler, MD

## 2011-02-06 DIAGNOSIS — Z8669 Personal history of other diseases of the nervous system and sense organs: Secondary | ICD-10-CM

## 2011-02-06 HISTORY — DX: Personal history of other diseases of the nervous system and sense organs: Z86.69

## 2011-02-10 ENCOUNTER — Other Ambulatory Visit: Payer: Self-pay | Admitting: Family Medicine

## 2011-02-10 ENCOUNTER — Encounter: Payer: Self-pay | Admitting: Family Medicine

## 2011-02-10 ENCOUNTER — Ambulatory Visit (INDEPENDENT_AMBULATORY_CARE_PROVIDER_SITE_OTHER): Payer: Medicare Other | Admitting: Family Medicine

## 2011-02-10 DIAGNOSIS — R51 Headache: Secondary | ICD-10-CM

## 2011-02-10 DIAGNOSIS — R519 Headache, unspecified: Secondary | ICD-10-CM

## 2011-02-10 DIAGNOSIS — H02409 Unspecified ptosis of unspecified eyelid: Secondary | ICD-10-CM

## 2011-02-11 ENCOUNTER — Ambulatory Visit (INDEPENDENT_AMBULATORY_CARE_PROVIDER_SITE_OTHER)
Admission: RE | Admit: 2011-02-11 | Discharge: 2011-02-11 | Disposition: A | Discharge status: Home / Self Care | Payer: Medicare Other | Source: Ambulatory Visit | Attending: Family Medicine | Admitting: Family Medicine

## 2011-02-11 DIAGNOSIS — R519 Headache, unspecified: Secondary | ICD-10-CM

## 2011-02-11 DIAGNOSIS — R51 Headache: Secondary | ICD-10-CM

## 2011-02-11 IMAGING — CT CT HEAD W/O CM
1 series · 15 of 30 positions shown, 19 images · non-contrast
Comparison: None.

CLINICAL DATA: 69-year-old female with pain over the right eye.  No
known injury.

CT HEAD WITHOUT CONTRAST
TECHNIQUE: Contiguous axial images were obtained from the base of
the skull through the vertex without contrast.

[Series 2: head_seq -c 4.5 h37s st · axial · 0.43mm/px · z∈[-134,-8]mm · 15 of 32 slices shown, 19 images]
[im 2/32  brain]
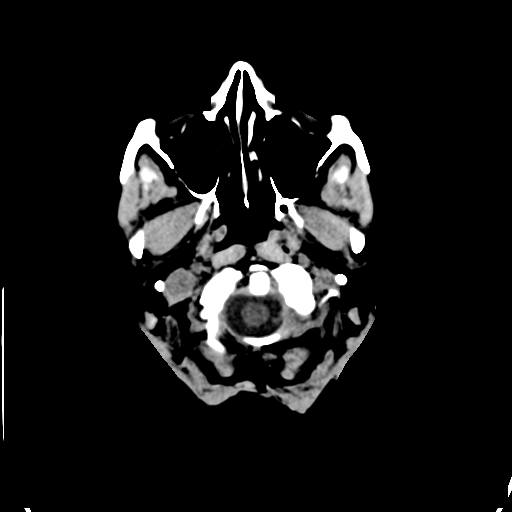
[im 2/32  bone]
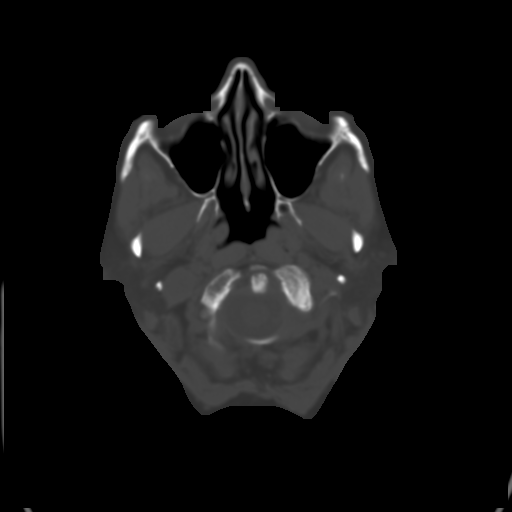
[im 4/32  brain]
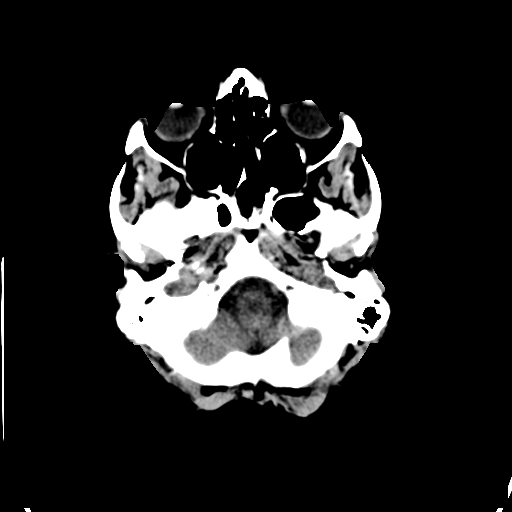
[im 6/32  brain]
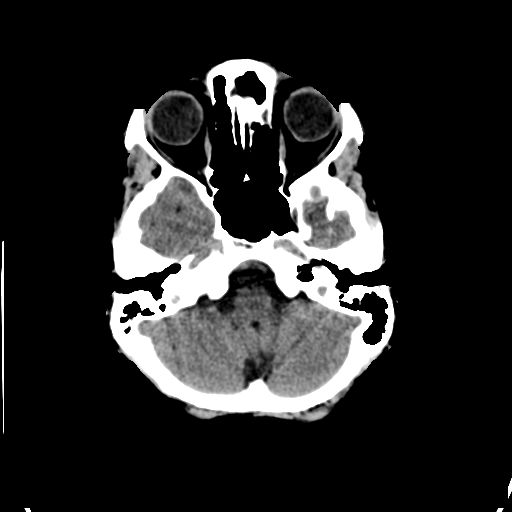
[im 8/32  brain]
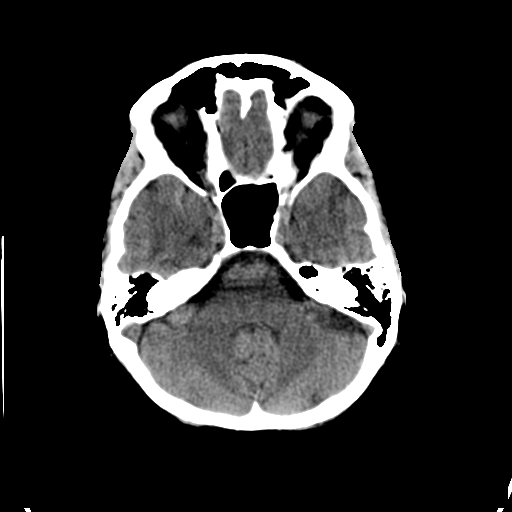
[im 10/32  brain]
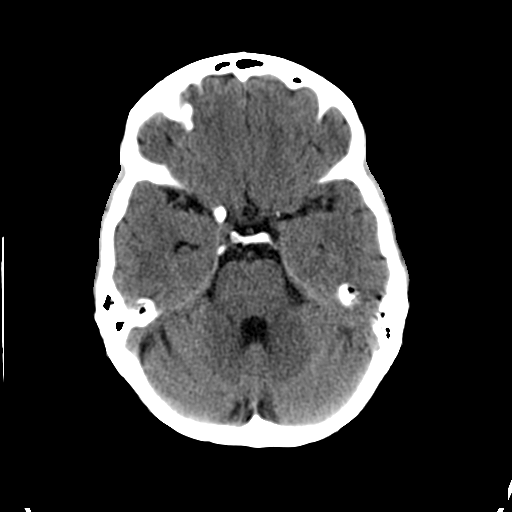
[im 10/32  bone]
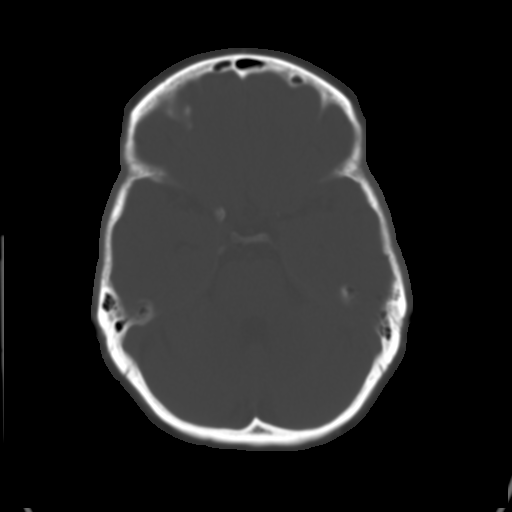
[im 12/32  brain]
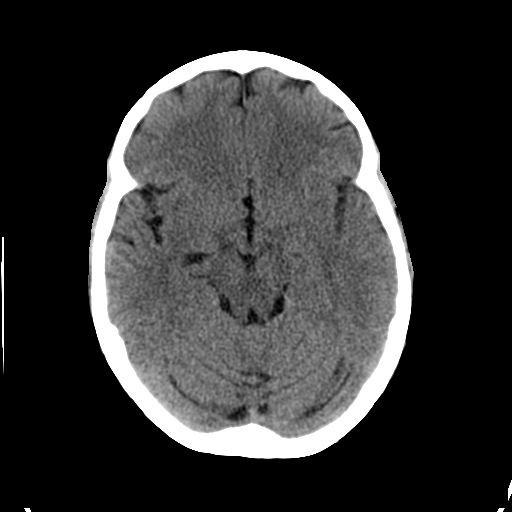
[im 14/32  brain]
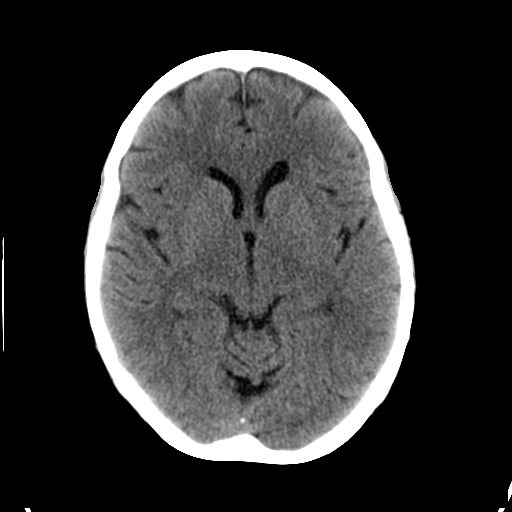
[im 17/32  brain]
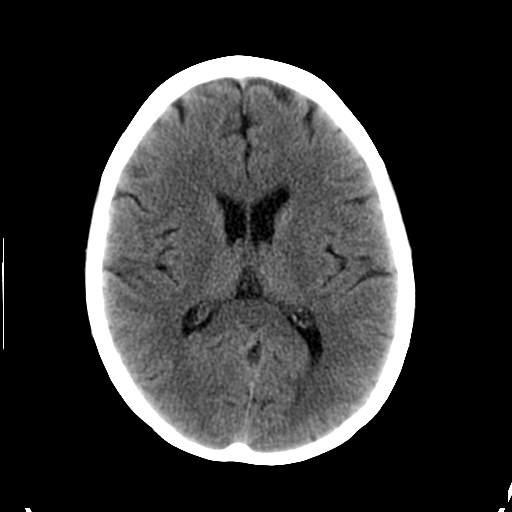
[im 18/32  brain]
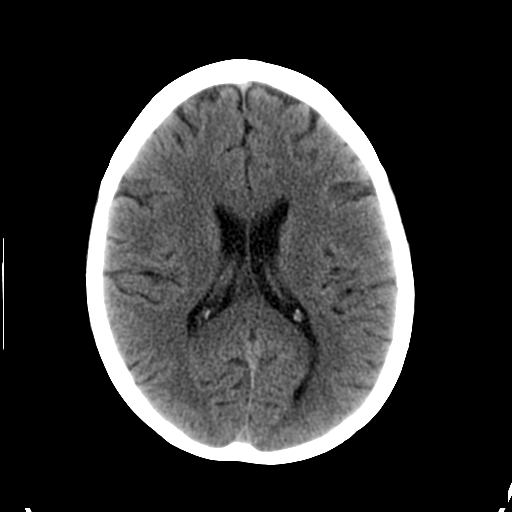
[im 18/32  bone]
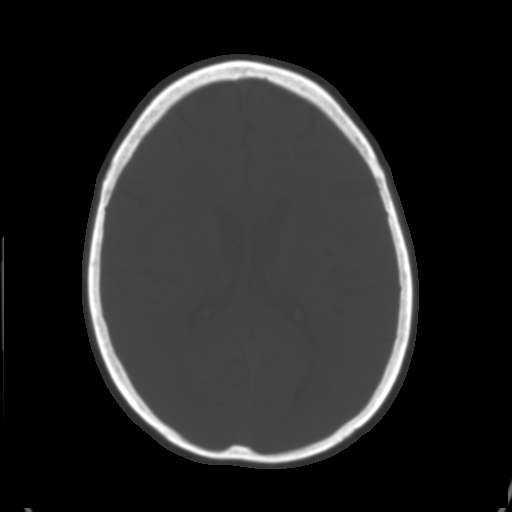
[im 20/32  brain]
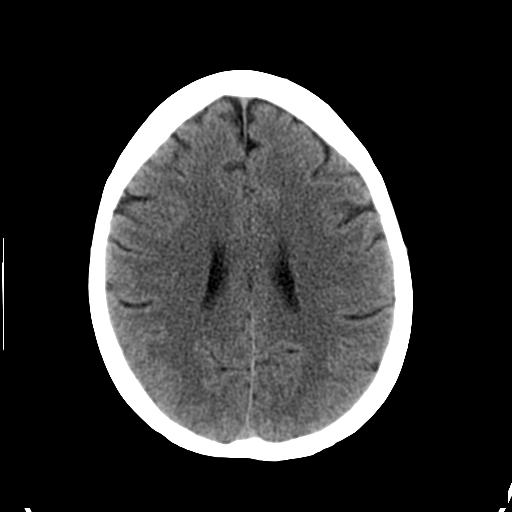
[im 22/32  brain]
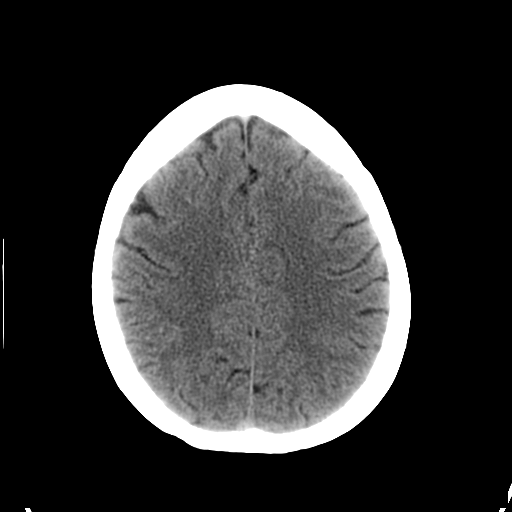
[im 24/32  brain]
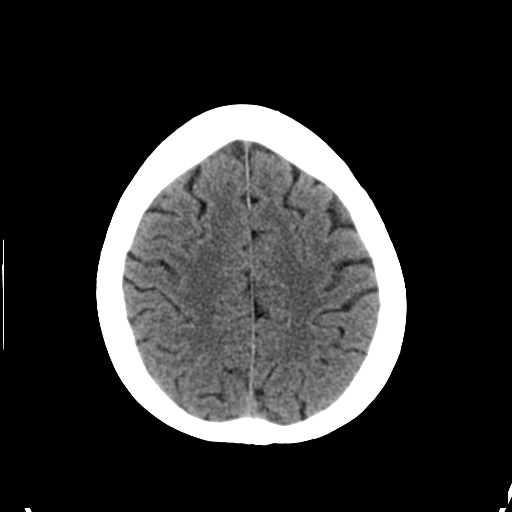
[im 26/32  brain]
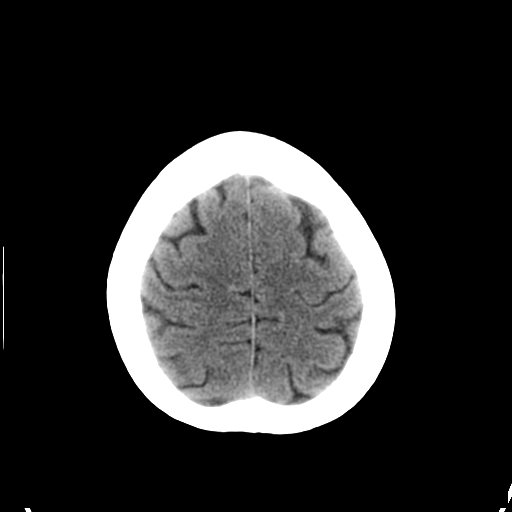
[im 26/32  bone]
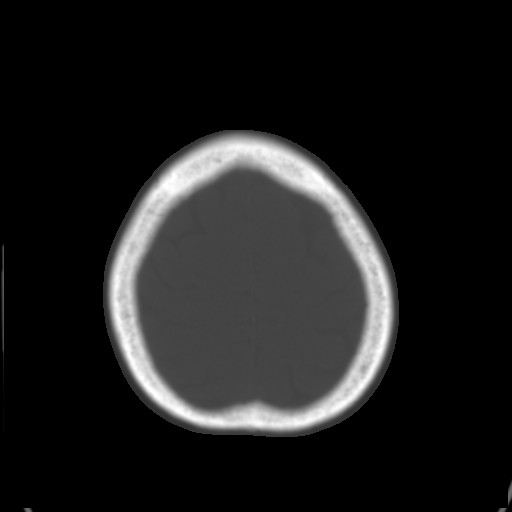
[im 28/32  brain]
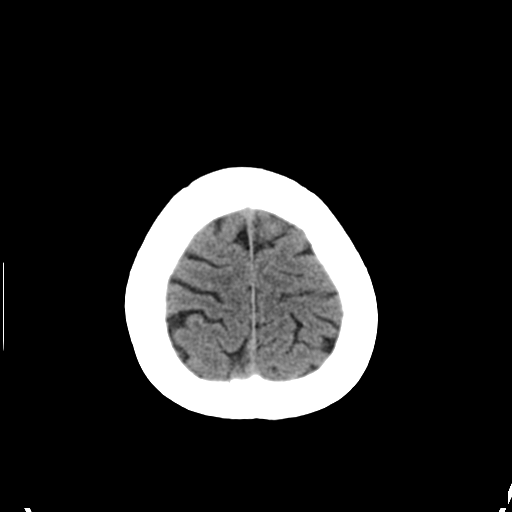
[im 30/32  brain]
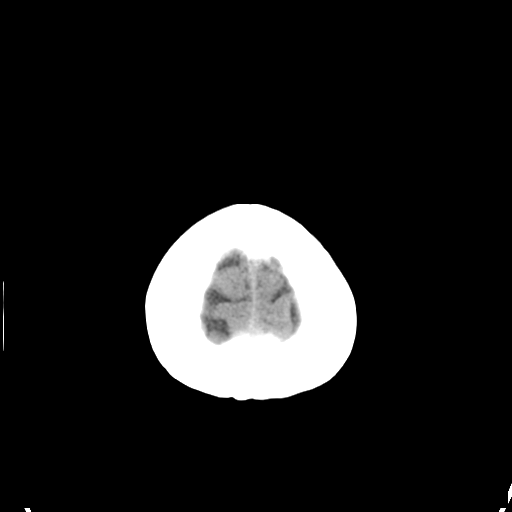

[15 of 30 positions shown; findings below may reference images not displayed]

FINDINGS: Visualized noncontrast orbit soft tissues are within
normal limits; no right periorbital lesion or intraorbital
abnormality identified.

Visualized scalp soft tissues are within normal limits.  No acute
osseous abnormality identified.  There is left frontal ethmoidal
recess and left anterior ethmoid paranasal sinus mucosal thickening
and opacification.  Other Visualized paranasal sinuses and mastoids
are clear.

Cerebral volume is within normal limits for age.  No midline shift,
ventriculomegaly, mass effect, evidence of mass lesion,
intracranial hemorrhage or evidence of cortically based acute
infarction.  Gray-white matter differentiation is within normal
limits throughout the brain.  No suspicious intracranial vascular
hyperdensity.
IMPRESSION: 1. Normal noncontrast CT appearance of the brain.
2.  No abnormality identified about the visualized right orbit.

## 2011-02-17 ENCOUNTER — Encounter: Payer: Self-pay | Admitting: Internal Medicine

## 2011-02-17 DIAGNOSIS — F419 Anxiety disorder, unspecified: Secondary | ICD-10-CM | POA: Insufficient documentation

## 2011-02-17 DIAGNOSIS — K589 Irritable bowel syndrome without diarrhea: Secondary | ICD-10-CM

## 2011-02-17 DIAGNOSIS — M8580 Other specified disorders of bone density and structure, unspecified site: Secondary | ICD-10-CM | POA: Insufficient documentation

## 2011-02-17 DIAGNOSIS — K219 Gastro-esophageal reflux disease without esophagitis: Secondary | ICD-10-CM | POA: Insufficient documentation

## 2011-02-17 DIAGNOSIS — M1990 Unspecified osteoarthritis, unspecified site: Secondary | ICD-10-CM | POA: Insufficient documentation

## 2011-02-17 DIAGNOSIS — E785 Hyperlipidemia, unspecified: Secondary | ICD-10-CM | POA: Insufficient documentation

## 2011-02-18 NOTE — Assessment & Plan Note (Signed)
Summary: HA/CLE   MEDICARE/[place] [employer] [place]   Vital Signs:  Patient profile:   70 year old female Height:      65 inches Weight:      128 pounds BMI:     21.38 Temp:     97.8 degrees F oral Pulse rate:   72 / minute Pulse rhythm:   regular BP sitting:   162 / 92  (left arm) Cuff size:   regular  Vitals Entered By: Christena Deem CMA (Kersey) (February 10, 2011 2:30 PM) CC: HA   History of Present Illness: Having recurring HA for last 5-6 weeks.  "It may have started about the time I had a head cold.  Somedays I have it and somedays I don't."  Can wake up at night with HA.  Always over the R eye.  Feels like the pain can be behind the eye.  "I don't normally have trouble with allergies."  Took 2 tylenol and then 2 ibuprofen with some eventual relief.  Pain doesn't throb.  Sharp pain.  Nauseated with pain. Light and noise bother the patient during the painful episodes.  Nonsmoker.    The area on the forhead can be sensitive even when the HA/pain isn't going on.   R upper eyelid is drooping since 11/11.  She had some tingling on the R side of the face at that time.  Her BP had been up at that time.  She was seen by EMS but she didn't go to the ER.    No CP/SOB.  No weakness in arms/legs.  No speech changes.  No trouble swallowing.    BP has been 120s/80s usually.    Allergies: 1)  ! Clonazepam (Clonazepam) 2)  ! Penicillin  Past History:  Past Medical History: Last updated: 09/26/2009 Anxiety GERD----------------------------------------------------------Dr [name] Hyperlipidemia------------------------------------------------Dr Stuckey Osteopenia 2005 Osteoarthritis IBS  Past Surgical History: Last updated: 2007-07-20 Cardiac cath--okay, mild changes  1997 T&A  1953 Cholecystectomy  1992 Hysterectomy  1983 Appendectomy 1983 Adenosine cardiolite  neg ishemia  07/2002  Family History: Last updated: July 20, 2007 Dad died @48   MI, DM Mom died @71  COPD 1 brother died @51   pulmonary fibrosis CAD in Dad HTN in Mom and sister DM in Dad Breast cancer in daughter No colon cancer  Social History: Last updated: 08/04/2008 Marital Status: Married Children: 2 Occupation: retired clerical--now part time Art therapist asst Never Smoked Alcohol use-no tries to exercise at least twice weekly  Review of Systems       See HPI.  Otherwise negative.    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  Unclear souce.  This may be migraine.  I would check head CT and continue the tylenol as needed for now.  I would not change meds yet.  Orders: Radiology Referral (Radiology)  Problem # 2:  PTOSIS (ICD-374.30) Unclear source.  This may be CVA vs. Bell's, but duration is atypical for Bells.  I would start with noncontrast head CT and then I'll get  in touch with patient.  She is already on statin, fish oil and her BP has been controlled.  Depending on CT result, we may have to have more aggressive tx . She agrees with plan.  Orders: Radiology Referral (Radiology)  Complete Medication List: 1)  Pravastatin Sodium 20 Mg Tabs (Pravastatin sodium) .Marland Kitchen.. 1 tab daily to reduce cholesterol 2)  Clorazepate Dipotassium 7.5 Mg Tabs (Clorazepate dipotassium) .Marland Kitchen.. 1 two times a day as needed for anxiety or irritable bowel 3)  Clobetasol Propionate 0.05 % Oint (Clobetasol propionate) .... Apply every other night 4)  Estrace 0.1 Mg/gm Crea (Estradiol) .... Apply every other night 5)   Melatonin 1 Mg Caps (Melatonin) .... As needed 6)  Stool Softener 100 Mg Caps (Docusate sodium) .... As needed 7)  Fish Oil 1000 Mg Caps (Omega-3 fatty acids) .... Take 2 by mouth once daily 8)  Calcium-vitamin D 250-125 Mg-unit Tabs (Calcium carbonate-vitamin d) .... Take 1 by mouth once daily 9)  Multivitamins Tabs (Multiple vitamin) .... Take 1 tablet by mouth once a day  Patient Instructions: 1)  See Rosaria Ferries about your referral before your leave today.   2)  I would take tylenol in the meantime for the headache and then I'll call you/notify you about the CT results.   3)  If you get weakness in your arms/legs or slurred speech, go to the ER.     Orders Added: 1)  Est. Patient Level IV [91694] 2)  Radiology Referral [Radiology]

## 2011-03-11 ENCOUNTER — Other Ambulatory Visit: Payer: Self-pay | Admitting: Internal Medicine

## 2011-03-19 ENCOUNTER — Telehealth: Payer: Self-pay | Admitting: *Deleted

## 2011-03-19 MED ORDER — PROPRANOLOL HCL 40 MG PO TABS: 40.0000 mg | ORAL_TABLET | Freq: Two times a day (BID) | ORAL | Status: DC

## 2011-03-19 NOTE — Telephone Encounter (Signed)
Please call patient.  She is currently taking a total of propranolol 57m po bid (2 of the 270mtabs twice a day).  I signed off on a new rx for the higher strength so she'll have fewer pills to take (4063mabs, 1 po bid).  Please notify patient about this.  i would continue the medicine.  Please call in the rx, notify the pharm that she likely won't need the rx for about 1 month.  Thanks.

## 2011-03-19 NOTE — Telephone Encounter (Signed)
At first, she didn't think she was doing any better but now, in the last week, she says she has improved.  She now has her refill of the med and is taking two tablets twice daily.  She says this will probably last her for about a month ~ .  Does she then taper off of the med or what is the plan?

## 2011-03-20 NOTE — Telephone Encounter (Signed)
Left message on AM for call back.

## 2011-03-25 MED ORDER — PROPRANOLOL HCL 40 MG PO TABS: 40.0000 mg | ORAL_TABLET | Freq: Two times a day (BID) | ORAL | Status: DC

## 2011-03-25 NOTE — Telephone Encounter (Signed)
Patient advised.

## 2011-03-25 NOTE — Telephone Encounter (Signed)
Patient is asking if she will be on this medication long-term?  If so, she would like to get a 90 day supply with refills to [facility].  She says she seems to be a little sleepy feeling and is not sure if this is from the medication or something else.  No other side-effects.

## 2011-03-25 NOTE — Telephone Encounter (Signed)
Yes, this would be long term.  Please notify pt.  I sent in the rx.

## 2011-04-16 ENCOUNTER — Encounter: Payer: Self-pay | Admitting: Internal Medicine

## 2011-04-16 ENCOUNTER — Ambulatory Visit (INDEPENDENT_AMBULATORY_CARE_PROVIDER_SITE_OTHER): Payer: Medicare Other | Admitting: Internal Medicine

## 2011-04-16 VITALS — BP 118/62 | HR 67 | Temp 98.5°F | Ht 63.0 in | Wt 124.0 lb

## 2011-04-16 DIAGNOSIS — R51 Headache: Secondary | ICD-10-CM

## 2011-04-16 DIAGNOSIS — F411 Generalized anxiety disorder: Secondary | ICD-10-CM

## 2011-04-16 DIAGNOSIS — K219 Gastro-esophageal reflux disease without esophagitis: Secondary | ICD-10-CM

## 2011-04-16 DIAGNOSIS — E785 Hyperlipidemia, unspecified: Secondary | ICD-10-CM

## 2011-04-16 DIAGNOSIS — Z0000 Encounter for general adult medical examination without abnormal findings: Secondary | ICD-10-CM | POA: Insufficient documentation

## 2011-04-16 NOTE — Progress Notes (Signed)
Subjective:    Patient ID: [name], female    DOB: 16-Jun-1941, 70 y.o.   MRN: [id number]  HPI Doing well Here for wellness exam and recheck She had zostavax but thinks it was here. She will check records  Chronic hearing loss Vision fine No instability  Feels well in general Still uses estrogen cream and ointment alternating  Ongoing stomach issues Uses the chlorazepate occ--may be only once a week Tried lactose free milk---may have helped some  No apparent problems with the pravastatin Due for labs  On propranolol for migraine prevention Started by Dr Damita Dunnings in past year Still gets occ aura of sensation on right forehead but no headaches  Current outpatient prescriptions:calcium-vitamin D (OSCAL WITH D 250-125) 250-125 MG-UNIT per tablet, Take 1 tablet by mouth daily.  , Disp: , Rfl: ;  clobetasol (TEMOVATE) 0.05 % ointment, Apply 1 application topically every other day.  , Disp: , Rfl: ;  clorazepate (TRANXENE) 7.5 MG tablet, Take 7.5 mg by mouth 2 (two) times daily as needed. For anxiety or IBS , Disp: , Rfl:  docusate sodium (COLACE) 100 MG capsule, Take 100 mg by mouth as directed.  , Disp: , Rfl: ;  Doxylamine Succinate, Sleep, (SLEEP AID PO), Take 1 tablet by mouth Nightly.  , Disp: , Rfl: ;  estradiol (ESTRACE) 0.1 MG/GM vaginal cream, Place 2 g vaginally every other day.  , Disp: , Rfl: ;  fish oil-omega-3 fatty acids 1000 MG capsule, Take 2 g by mouth daily.  , Disp: , Rfl:  Multiple Vitamin (MULTIVITAMIN) tablet, Take 1 tablet by mouth daily.  , Disp: , Rfl: ;  pravastatin (PRAVACHOL) 20 MG tablet, TAKE ONE TABLET BY MOUTH EVERY DAY TO REDUCE CHOLESTEROL, Disp: 90 tablet, Rfl: 3;  propranolol (INDERAL) 40 MG tablet, Take 1 tablet (40 mg total) by mouth 2 (two) times daily., Disp: 180 tablet, Rfl: 3;  DISCONTD: Melatonin 1 MG CAPS, Take by mouth as needed.  , Disp: , Rfl:   Past Medical History  Diagnosis Date  . Anxiety   . GERD (gastroesophageal reflux disease)       Dr. [name]  . HLD (hyperlipidemia)     Dr. Lia Foyer  . Osteopenia 2005  . OA (osteoarthritis)   . IBS (irritable bowel syndrome)     Past Surgical History  Procedure Date  . Cardiac catheterization 1997    okay, mild changes  . Tonsillectomy and adenoidectomy 1953  . Cholecystectomy 1992  . Abdominal hysterectomy 1983  . Appendectomy 1983    Family History  Problem Relation Age of Onset  . Heart attack Father 44  . Diabetes Father   . COPD Mother   . Pulmonary fibrosis Brother 36  . Coronary artery disease Father   . Hypertension Mother   . Hypertension Sister   . Cancer Daughter     breast    History   Social History  . Marital Status: Married    Spouse Name: N/A    Number of Children: N/A  . Years of Education: N/A   Occupational History  . Retired Database administrator asst.    Social History Main Topics  . Smoking status: Never Smoker   . Smokeless tobacco: Not on file  . Alcohol Use: No  . Drug Use: Not on file  . Sexually Active: Not on file   Other Topics Concern  . Not on file   Social History Narrative  . No narrative on file   Review of  Systems Weight is stable Appetite is not that big but eats okay Chronic sleep problems---sleeps 4 hours, then up, then lightly after Uses sleep aide several times per week Bowels are okay No voiding troubles No depressed mood of note     Objective:   Physical Exam  Constitutional: She is oriented to person, place, and time. She appears well-developed and well-nourished. No distress.  HENT:  Mouth/Throat: Oropharynx is clear and moist. No oropharyngeal exudate.  Neck: Normal range of motion. Neck supple. No thyromegaly present.  Cardiovascular: Normal rate, regular rhythm, normal heart sounds and intact distal pulses.  Exam reveals no gallop.   No murmur heard. Pulmonary/Chest: Effort normal and breath sounds normal. No respiratory distress. She has no wheezes. She has no rales.  Abdominal:  Soft. She exhibits no mass. There is no tenderness.  Musculoskeletal: Normal range of motion. She exhibits edema. She exhibits no tenderness.       Slight edema in right foot  Lymphadenopathy:    She has no cervical adenopathy.  Neurological: She is alert and oriented to person, place, and time. She exhibits normal muscle tone.       President-- "[name]" [phone] Recall 3/3  Skin: Skin is warm. No rash noted.  Psychiatric: She has a normal mood and affect. Her behavior is normal. Judgment and thought content normal.          Assessment & Plan:  I have personally reviewed the Medicare Annual Wellness questionnaire and have noted 1. The patient's medical and social history 2. Their use of alcohol, tobacco or illicit drugs 3. Their current medications and supplements 4. The patient's functional ability including ADL's, fall risks, home safety risks and hearing or visual             impairment. 5. Diet and physical activities 6. Evidence for depression or mood disorders  The patients weight, height, BMI and visual acuity have been recorded in the chart I have made referrals, counseling and provided education to the patient based review of the above and I have provided the pt with a written personalized care plan for preventive services.  I have provided you with a copy of your personalized plan for preventive services. Please take the time to review along with your updated medication list.

## 2011-04-17 LAB — CBC WITH DIFFERENTIAL/PLATELET
Basophils Absolute: 0 10*3/uL (ref 0.0–0.1)
Eosinophils Absolute: 0.1 10*3/uL (ref 0.0–0.7)
Lymphocytes Relative: 23 % (ref 12.0–46.0)
MCHC: 34.3 g/dL (ref 30.0–36.0)
MCV: 89.6 fl (ref 78.0–100.0)
Monocytes Absolute: 0.6 10*3/uL (ref 0.1–1.0)
Neutrophils Relative %: 67.2 % (ref 43.0–77.0)
Platelets: 293 10*3/uL (ref 150.0–400.0)
RDW: 14.2 % (ref 11.5–14.6)

## 2011-04-17 LAB — BASIC METABOLIC PANEL
CO2: 27 mEq/L (ref 19–32)
Calcium: 8.7 mg/dL (ref 8.4–10.5)
Chloride: 106 mEq/L (ref 96–112)
Glucose, Bld: 81 mg/dL (ref 70–99)
Sodium: 141 mEq/L (ref 135–145)

## 2011-04-17 LAB — HEPATIC FUNCTION PANEL
ALT: 12 U/L (ref 0–35)
AST: 18 U/L (ref 0–37)
Alkaline Phosphatase: 51 U/L (ref 39–117)
Bilirubin, Direct: 0.1 mg/dL (ref 0.0–0.3)
Total Bilirubin: 0.5 mg/dL (ref 0.3–1.2)
Total Protein: 5.3 g/dL — ABNORMAL LOW (ref 6.0–8.3)

## 2011-04-17 LAB — LIPID PANEL: HDL: 44.8 mg/dL (ref >39.00)

## 2011-04-22 NOTE — Assessment & Plan Note (Signed)
NAME:  RICARDO, KAYES               ACCOUNT NO.:  [id number]   MEDICAL RECORD NO.:  [id number]          PATIENT TYPE:  POB   LOCATION:  [place] at [place]:  [place]:  Emily Filbert, MD        DATE OF BIRTH:  [birth year]   DATE OF SERVICE:  10/28/2010                                  CLINIC NOTE   Ms. Severtson is a 70 year old lady with lichen sclerosus diagnosed via  biopsy.  She has been on clobetasol and a compounded estrogen cream  every other day since April 2011.  She says that she is currently  asymptomatic.  I have her on every 26-monthbasis coming in for vulvar  exams and she thinks that she is not able to get the physician to check  her vulva.   On exam, there is no evidence of dysplasia.  Her vulva appears normal  now with the exception of moderate atrophy.   I recommended that she increase the amount of her compounded estrogen  cream on an every other day basis.  I will see her in 6 months or sooner  if she needs something sooner.      MEmily Filbert MD     MCD/MEDQ  D:  10/28/2010  T:  10/29/2010  Job:  [id number]

## 2011-04-22 NOTE — Assessment & Plan Note (Signed)
NAMEJERRI, [name]               ACCOUNT NO.:  [id number]   MEDICAL RECORD NO.:  [id number]          PATIENT TYPE:  POB   LOCATION:  [place] at [place]:  [place]:  Darron Doom, MD        DATE OF BIRTH:  1941-01-27   DATE OF SERVICE:  10/23/2009                                  CLINIC NOTE   CHIEF COMPLAINT:  Vulvar lesion.   HISTORY OF PRESENT ILLNESS:  The patient is a 70 year old gravida 2,  para 2 who is referred in by Dr. Arnette Norris for evaluation of vulvar  lesion.  The patient reports a somewhat greater than 1-year history of  pain, severe itching and burning, and pain with initiation of  intercourse.  The patient has been previously worked up by Dr. Deborra Medina, who  started her on clobetasol.  She reports she has been on that for some  time approximately 6 weeks and she initially started on 8 days of daily  clobetasol and the decreased it to every other day.  She continues to  have pain and some irritation when she does not take her clobetasol as  needed.  The patient reports some relief of symptoms with the  clobetasol, however.  Because the patient continued to have symptoms,  she was referred in for further evaluation and for biopsy.   PAST MEDICAL HISTORY:  Significant for elevated cholesterol,  osteoarthritis, GERD, osteopenia, irritable bowel syndrome, history of  peptic ulcer disease that led to anemia.   PAST SURGICAL HISTORY:  Laparoscopic cholecystectomy, appendectomy, TAH  unilateral salpingo-oophorectomy, and tonsils and adenoids previously.   MEDICATIONS:  Clorazepate one p.o. daily, clobetasol ointment every  other day, and propoxyphene as needed for pain.   ALLERGIES:  CLONAZEPAM and PENICILLIN.   FAMILY HISTORY:  BRCA-1 negative.  The daughter diagnosed of breast  cancer at age 27.  Hypertension in sister.  Diabetes, heart disease in  her father.  COPD and pulmonary fibrosis, her brother died of that.   SOCIAL HISTORY:  Patient  is married.  She works part-time as a Warden/ranger.  She denies alcohol or tobacco use.   REVIEW OF SYSTEMS:  Please see GYN history in the chart.  Diffusely  positive swelling in legs, muscle aches, fatigue, problems with hearing,  some chest pain, loss of urine, vaginal itching, and painful intercourse  as described in the HPI.   PHYSICAL EXAMINATION:  VITAL SIGNS:  Her vitals are as noted in the  chart.  GENERAL:  She is a well-developed, well-nourished woman in no acute  distress.  HEENT:  Normocephalic, atraumatic.  Sclerae anicteric.  Normal  respirations.  Pulse is regular.  GU:  The patient has some mild labial atrophy, some ground-glass  appearance to the labia minora consistent with probable lichen  sclerosis.  There is some mild erythema associated with this, but  certainly it is not ulcerated.   PROCEDURE:  The patient is prepped with alcohol swab which she did not  tolerate very well, possibly will try Betadine in the future.  She was  injected with 1 mL of lidocaine, a punch biopsy was obtained.  Hemostasis was obtained  with silver nitrate.  The patient tolerated the  procedure well.   IMPRESSION:  Vulvar itching and irritation consistent with lichen  sclerosis/lichen planus.   PLAN:  Return in 2 weeks for discussion of biopsy results continue  clobetasol if this is the diagnosis, she does require intermittent  biopsy from time to time.  No evidence of VIN.   Thank you for this referral.  We will keep you updated as to results.           ______________________________  Darron Doom, MD     TP/MEDQ  D:  10/23/2009  T:  10/24/2009  Job:  [id number]   cc:   Arnette Norris, MD

## 2011-04-22 NOTE — Assessment & Plan Note (Signed)
[name], [name]               ACCOUNT NO.:  [id number]   MEDICAL RECORD NO.:  [id number]          PATIENT TYPE:  POB   LOCATION:  [place] at [place]:  Darron Doom, MD        DATE OF BIRTH:  09-22-1941   DATE OF SERVICE:                                  CLINIC NOTE   CHIEF COMPLAINT:  Followup results.   HISTORY OF PRESENT ILLNESS:  The patient is a 70 year old G2, P2 who  comes today for followup of biopsy report and review of biopsy recorded  from biopsy of a vulvar  lesion.  She was initially treated by Dr. Marjory Lies  for lichen planus.  I have suspected lichen sclerosus and biopsy was  performed.  The biopsy failed to confirm either of this and in fact it  was returned as a lichenoid infiltrate with slight atrophy, has a band-  like infiltrate of lymphocytes, and scattered eosinophils at the  subepithelial junction with atrophy.  There is no high-grade nuclear  atypia.  No evidence of malignancy or high-grade dysplasia noted.  She  had an elastin stain and PAS stain, which failed to show fungal  organisms, basement membrane thickening, or simply an elastin fiber  ulceration, so the diagnosis was unclear.  She and I discussed this at  length on how it has failed to confirm either diagnosis, and that  probably she would benefit from a repeat biopsy in about 6 months.  We  will continue her clobetasol for now and she will try to wean that to  every third day if possible.  She will keep a diary of symptoms and how  much she is using her clobetasol, and we will follow this less than  approximately 6 weeks.           ______________________________  Darron Doom, MD     TP/MEDQ  D:  11/08/2009  T:  11/09/2009  Job:  [id number]   cc:   Dr. Ephraim Hamburger

## 2011-04-22 NOTE — Assessment & Plan Note (Signed)
NAME:  [name], [name]               ACCOUNT NO.:  [id number]   MEDICAL RECORD NO.:  [id number]          PATIENT TYPE:  POB   LOCATION:  [place] at [place]:  [place]:  Emily Filbert, MD        DATE OF BIRTH:  [birth year]   DATE OF SERVICE:  05/07/2010                                  CLINIC NOTE   [name] is a 70 year old lady who was seen by me on April 02, 2010,  for followup of vulvar irritation.  She had been using clobetasol twice  a week along with Vagisil.  I have biopsied it and it came back as  probable lichen sclerosus.  There was no evidence of high-grade  dysplasia.  I have recommended that she continue clobetasol on an every-  other-day basis and I added Estrace vaginal cream.  She found the  Estrace to be too expensive, so she found a pharmacy that would compound  estrogen cream and she is using that every other day as well.   On exam today, there is a dramatic improvement of the amount of atrophy  and there is no evidence of malignancy.  I have recommended that she be  followed every 6 months for a vulvar exam due to the increased risk of  vulvar cancer with lichen sclerosus.  I recommended self-vulvar exams  monthly, but she is not sure she will be able to get into that position  to see it.  Her mammogram is due in August of this year and she plans on  scheduling herself.      Emily Filbert, MD     MCD/MEDQ  D:  05/07/2010  T:  05/08/2010  Job:  [id number]

## 2011-04-22 NOTE — Assessment & Plan Note (Signed)
NAME:  [name], [name]               ACCOUNT NO.:  [id number]   MEDICAL RECORD NO.:  [id number]          PATIENT TYPE:  POB   LOCATION:  [place] at [place]:  [place]:  Emily Filbert, MD        DATE OF BIRTH:  September 23, 1941   DATE OF SERVICE:  04/02/2010                                  CLINIC NOTE   [name] is a 70 year old very pleasant lady, who was seen by Dr.  Kennon Rounds here in January of this year for the complaint of vulvar  irritation.  She has had a biopsy done by Dr. Kennon Rounds October 23, 2009,  that showed a lichenoid infiltrate.  She has been using clobetasol  approximately twice a week and some Vagisil in between.   On exam, there are some splotchy areas that would be consistent with  lichen sclerosus.  However, I am not convinced entirely this is lichen  sclerosus.  There is marked amount of atrophy as well.  I did a small  vulvar biopsy on her right labia minora after I prepped the area with  Betadine and used 1% lidocaine for anesthesia.  As she tolerated the  procedure well, I placed a silver nitrate on the wound to yield  excellent hemostasis.  I have recommended that she start using Estrace  vaginal cream on every other day basis as well as clobetasol on an every  other day basis.  She will come back in 1 month to see me and if that is  improved and I happily expect it to be improved, I will just make sure  the biopsy is normal.  The biopsy shows no atypia.      Emily Filbert, MD     MCD/MEDQ  D:  04/02/2010  T:  04/03/2010  Job:  [id number]

## 2011-04-22 NOTE — Assessment & Plan Note (Signed)
NAMEMEILIN, BROSH               ACCOUNT NO.:  [id number]   MEDICAL RECORD NO.:  [id number]          PATIENT TYPE:  POB   LOCATION:  [place] at [place]:  [facility]   PHYSICIAN:  Darron Doom, MD        DATE OF BIRTH:  1941/02/07   DATE OF SERVICE:                                  CLINIC NOTE   CHIEF COMPLAINT:  Followup.   HISTORY OF PRESENT ILLNESS:  The patient is a 70 year old patient who  comes in with vulvar irritation consistent with atrophic lichen  sclerosus, although biopsy failed to confirm this.  She has had  irritation and itching and has been treated with clobetasol.  Because we  failed to get diagnosis and were trying to limit the amount of  clobetasol she is on, we have decreased her clobetasol to twice weekly.  The patient has been using it on Monday and Thursday for the past 2  months with fairly good results.  Occasionally, she will feel like she  needs to use it a day early, but she has refrained from this.  The  patient has been trying to wean down to once weekly dosing, and we will  consider repeat biopsy in 4 months, in May of this year.   IMPRESSION:  Vulvar irritation.   PLAN:  1. Followup vulvar biopsy in 4 months.  2. Continue clobetasol once to twice weekly as needed for control of      symptoms.           ______________________________  Darron Doom, MD     TP/MEDQ  D:  12/26/2009  T:  12/27/2009  Job:  [id number]

## 2011-04-25 NOTE — Op Note (Signed)
NAME:  [name], [name]                         ACCOUNT NO.:  [id number]   MEDICAL RECORD NO.:  [id number]                   PATIENT TYPE:  INP   LOCATION:  4703                                 FACILITY:  [facility]   PHYSICIAN:  Delfin Edis, M.D. [facility]               DATE OF BIRTH:  Dec 13, 1940   DATE OF PROCEDURE:  DATE OF DISCHARGE:                                 OPERATIVE REPORT   PROCEDURE:  Upper endoscopy and colonoscopy.   INDICATIONS:  This 70 year old white female was admitted for cardiac  catheterization and was found to be severely anemic. Her hemoglobin on  admission was 6.4 gm and an MCV was 64. She gives a history of increased  weakness. She denies any overt GI bleeding.  The patient's  medications have  included aspirin and Vioxx. She has been treated for reflux with Prilosec  600 mg on a p.r.n. basis.   Her rectal examination was Hemoccult negative on admission. There is no  family history of colon cancer. She is  undergoing upper and lower endoscopy  to assess her iron deficiency anemia.   ENDOSCOPE:  Fujinon __________ endoscope.   SEDATION:  Versed 7 mg IV, fentanyl 50 mcg IV.   FINDINGS:  Fujinon single channel endoscope passed into the posterior  pharynx and esophagus. The patient was monitored by pulse oximeter. Her  oxygen saturations were normal. The proximal and distal esophageal mucosa  was normal without evidence of esophagitis or stricture. The squamocolumnar  junction appeared clean without inflammatory changes or fibrosis. There was  no hiatal hernia.   Stomach:  The stomach was insufflated with air and showed numerous coffee  grounds specks in the stomach in the dependent portion with some erosions  within the lesser curvature of the stomach and also in the gastric antrum.  There were multiple erosions in the prepyloric antrum consistent with  erosive gastritis. There were no ulcerations. Retroflexion of the endoscope  revealed normal fundus and  cardia. The pyloric outlet itself was normal.   Duodenum:  The duodenal bulb and descending duodenum were normal. Small  biopsies were taken from the second portion of the duodenum to rule out  villous atrophy.   The endoscope was then brought back into the stomach and biopsies were taken  from the gastric antrum for CLOtest. The patient tolerated the procedure  well.   IMPRESSION:  1. Erosive gastritis in the antrum with coffee ground material consistent     with low-grade gastrointestinal blood loss, status post CLOtest.  2. Status post small bowel biopsies.   COLONOSCOPY:   ENDOSCOPE:  The Fujinon single channel endoscope.   SEDATION:  Additional Versed 3 mg IV and fentanyl 50 mcg IV.   FINDINGS:  The Fujinon single channel endoscope was passed in routine  fashion into the sigmoid colon. The patient was monitored again by pulse  oximetry again and her O2 saturations were normal. Her  prep was excellent.  There were first grade hemorrhoids in the rectum, prolapsed, were not  bleeding, and showed no evidence of blood clots. The  sigmoid colon was  normal. There was no colitis, no diverticula. The descending colon, splenic  flexure, transverse colon were unremarkable. The endoscope traversed easily  through the hepatic flexure through to the right colon through the cecum.  The ileocecal valve was unremarkable. The colonoscope was then retracted and  the colon was decompressed. The patient tolerated the procedure well.   IMPRESSION:  Normal colonoscopy up to the cecum with the exception of first  grade hemorrhoids.   PLAN:  The above upper and lower endoscopy show that the most likely source  of bleeding is the gastric antrum. Gastric erosions, most likely are induced  by a combination of aspirin and Vioxx. Since the patient is severely iron  deficient, I suspect she has been having low GI blood loss for a long time.  She possibility might have had iron deficiency anemia from the  time of prior  to her hysterectomy which was done for menorrhagia. Since she has never  really taken iron supplements until recently.   PLAN:  1. Iron supplements.  2. Discontinue aspirin.  3. Await results of hemoglobin electrophoresis.  4. Use proton pump inhibitor to protect her from gastritis induced by anti-     inflammatory agents which the patient takes for arthritis.                                               Delfin Edis, M.D. [facility]    DB/MEDQ  D:  11/10/2002  T:  11/10/2002  Job:  [id number]   cc:   Loretha Brasil. Lia Foyer, M.D. [facility]  [address] [address]  Fax: 1

## 2011-04-25 NOTE — Discharge Summary (Signed)
NAME:  [name], [name]                         ACCOUNT NO.:  [id number]   MEDICAL RECORD NO.:  [id number]                   PATIENT TYPE:  INP   LOCATION:  4703                                 FACILITY:  [facility]   PHYSICIAN:  Loretha Brasil. Lia Foyer, M.D. [facility]         DATE OF BIRTH:  July 05, 1941   DATE OF ADMISSION:  11/09/2002  DATE OF DISCHARGE:  11/11/2002                           DISCHARGE SUMMARY - REFERRING   PROCEDURES:  1. Transfusion of 2 units of blood.  2. Esophagogastroduodenoscopy.  3. Colonoscopy.   HOSPITAL COURSE:  The patient is a 70 year old female with a history of  minimal coronary artery disease by catheterization in 1997.  She was seen by  her primary care physician for a two-month history of severe fatigue and  dyspnea on exertion which was followed by chest tightness.  She also had  right shoulder and back pain and tachy palpitations.  She was on Protonix  for 11 days but continued to have chest tightness and some abdominal pain,  so she discontinued the medication.  Because she continued to have symptoms,  she was scheduled for an outpatient cardiac catheterization.   When the patient came to the hospital for the cardiac catheterization, her  labs were checked and it was found that her hemoglobin was 6.4.  Because of  this, the cardiac catheterization was cancelled and she was admitted for  further workup of her anemia.  A GI consult was called.   The patient was seen by Dr. Delfin Edis, who felt that she needed hemoglobin  electrophoresis, as well as an EGD and colonoscopy.  The hemoglobin  electrophoresis is pending at the time of this dictation but it has been  performed.  The upper endoscopy showed erosive gastritis with coffee-ground  old blood.  An H. pylori test is pending at the time of dictation.  The  colonoscopy showed first-grade hemorrhoids but no other abnormalities.  Her  aspirin was discontinued but she was allowed to continue on the Vioxx.  Her  serum ferritin was checked and it was low at 4.  An iron level had  previously been checked by Dr. Ronnald Collum and it was also low at 84.  She was  started on t.i.d. iron supplementation, as well as the Protonix.   Dr. [name] felt that with a hemoglobin of 10 and once the probable source of  the bleeding had been controlled with discontinuation of the aspirin and the  addition of Protonix, she could be followed up as an outpatient with blood  work in four weeks with Dr. Ronnald Collum.   The patient had no further episodes of chest pain once her hemoglobin was at  least partly corrected.  She had had a stress test in November that showed  no ischemia.  It was felt that she did not need any further in-hospital  workup for this and could be followed in the office as an outpatient.   The  patient was ambulating with the mobility team without chest pain or  shortness of breath.  She was evaluated by Dr. Albertine Patricia and considered stable  for discharge on November 11, 2002 with outpatient followup arranged.   LABORATORY VALUES:  Hemoglobin 10.2, hematocrit 31.4, WBC 7.6, platelets  265.  Retic count 1.4% with an absolute reticulocyte count of 47.9.  Hemoglobin electrophoresis pending, as well as a Helicobacter screen pending  at the time of dictation.  A serum ferritin level was 4.   DISCHARGE CONDITION:  Improved.   CONSULTATIONS:  Dr. Delfin Edis.   DISCHARGE DIAGNOSES:  1. Severe iron-deficiency anemia.  2. Gastritis.  3. First-grade hemorrhoids.  4. Dyspnea on exertion/chest pain, possibly secondary to anemia.  5. History of hyperlipidemia.  6. History of left internal carotid artery stenosis of 25% with a carotid     bruit.  7. Status post cardiac catheterization in 1997 with 30% and 40% left     anterior descending lesions, as well as a 20% right coronary artery     stenosis.  8. Preserved left ventricular function with an ejection fraction of 54% by     Cardiolite in August 2003.  9.  Osteoarthritis.  10.      Family history of premature coronary artery disease.  11.      History of allergy to PENICILLIN.   DISCHARGE INSTRUCTIONS:  Her activity level is to be as tolerated.  She is  to check her blood counts in four weeks and then every three months with Dr.  Ronnald Collum, who will also follow her iron profile.  She is to stick to a low-  fat diet.  She is to keep her December 22 appointment with Dr. Ronnald Collum.  She has a P.A. appointment per Dr. Lia Foyer on December 18 at 2:30 p.m.  A  decision will be made at that time depending on how she is doing as to  whether she needs further evaluation with cardiac catheterization.   DISCHARGE MEDICATIONS:  1. Iron 325 mg t.i.d.  2. Protonix 40 mg q.d.  3. Colace 100 mg 2-3 times a day p.r.n.  4. Premarin 0.625 mg q.d.  5. Vitamins C, D, Q75, calcium, folic acid, and multivitamin as prior to     admission.  6. Lipitor 10 mg q.d.  7. Vioxx 25 mg 1/2 tablet q.d.  8. Chlorazepate 7.5 mg as taken prior to admission.     Davis Gourd, P.A. [facility]                  Thomas D. Lia Foyer, M.D. [facility]    RG/MEDQ  D:  11/11/2002  T:  11/11/2002  Job:  [id number]   cc:   Lenard Simmer, M.D.  [place]  [place] [address]  Fax: [phone]

## 2011-05-28 ENCOUNTER — Other Ambulatory Visit: Payer: Self-pay | Admitting: Internal Medicine

## 2011-05-28 NOTE — Telephone Encounter (Signed)
rx called into pharmacy

## 2011-05-28 NOTE — Telephone Encounter (Signed)
Okay #60 x 0 

## 2011-06-05 ENCOUNTER — Ambulatory Visit (INDEPENDENT_AMBULATORY_CARE_PROVIDER_SITE_OTHER): Payer: Medicare Other | Admitting: Obstetrics & Gynecology

## 2011-06-05 DIAGNOSIS — L940 Localized scleroderma [morphea]: Secondary | ICD-10-CM

## 2011-06-06 NOTE — Assessment & Plan Note (Signed)
NAME:  [name], [name] NO.:  [id number]  MEDICAL RECORD NO.:  [id number]          PATIENT TYPE:  LOCATION:  [place] [facility] [place]:  PHYSICIAN:  Emily Filbert, MD             DATE OF BIRTH:  DATE OF SERVICE:  06/05/2011                                 CLINIC NOTE  [name] is a 70 year old, married, white lady, who has been followed here for several years for her GYN needs.  She has no particular GYN complaints.  She needs a refill on her clobetasol and still has plenty of her Estrace vaginal cream.  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  She also has had Bell's palsy in November 2011, which left her with migraines.  PAST SURGICAL HISTORY:  Cholecystectomy, appendectomy, TAH, USO, tonsillectomy, adenoidectomy, and she had a heart catheterization in 1997 that she describes as normal.  SOCIAL HISTORY:  Negative for tobacco, alcohol, or drug use.  FAMILY HISTORY:  Positive for breast cancer that was diagnosed in her daughter at age 69.  Her daughter is alive and well at age 57.  She denies family history of GYN or colon malignancies.  ALLERGIES:  PENICILLIN, although the reaction that she had was 40 years ago and she does not remember what it was.  No latex allergies.  REVIEW OF SYSTEMS:  She has not had a breast exam in several years.  Her last mammogram was August 2011.  Her colonoscopy and endoscopy were done in 2003.  She and her husband are no longer sexually active.  She is married.  MEDICATIONS: 1. She takes Pravachol 40 mg b.i.d. 2. Clorazepate 7.5 mg at night on a p.r.n. basis, she uses it about     three times a week. 3. Fish oil daily. 4. Over-the-counter sleep aid that she uses about three times a week. 5. Multivitamin daily. 6. Calcium with vitamin D daily. 7. Stool softener on a p.r.n.  basis.  PHYSICAL EXAMINATION:  GENERAL:  Well-nourished, well-hydrated, white female. VITAL SIGNS:  Her height is 5 feet 3 inches.  Her weight is 125 pounds (unchanged weight).  Blood pressure 124/59, pulse 56. HEENT:  Normal. HEART:  Regular rate and rhythm. LUNGS:  Clear to auscultation bilaterally. BREASTS:  Normal bilaterally. ABDOMEN:  Benign.  No palpable hepatosplenomegaly. GU:  Vulva, only mild atrophy.  No more lichen sclerosus type lesions or other lesions.  Vagina, only mild atrophy.  Vaginal cuff, atrophic but no lesions.  Bimanual exam reveals no masses.  ASSESSMENT AND PLAN: 1. Annual exam.  I have recommended self-breast and self-vulvar exams. 2. History of lichen sclerosus.  She will continue with her     alternating days of clobetasol and Estrace given her refill on the     clobetasol, and she will call when she needs refill on the Estrace,     otherwise, I will plan on seeing her in a year or p.r.n. sooner.     Emily Filbert, MD    MCD/MEDQ  D:  06/05/2011  T:  06/06/2011  Job:  [id number] 

## 2011-07-03 ENCOUNTER — Other Ambulatory Visit: Payer: Self-pay | Admitting: Internal Medicine

## 2011-07-03 DIAGNOSIS — Z1231 Encounter for screening mammogram for malignant neoplasm of breast: Secondary | ICD-10-CM

## 2011-07-29 ENCOUNTER — Ambulatory Visit (HOSPITAL_COMMUNITY)
Admission: RE | Admit: 2011-07-29 | Discharge: 2011-07-29 | Disposition: A | Discharge status: Home / Self Care | Payer: Medicare Other | Source: Ambulatory Visit | Attending: Internal Medicine | Admitting: Internal Medicine

## 2011-07-29 DIAGNOSIS — Z1231 Encounter for screening mammogram for malignant neoplasm of breast: Secondary | ICD-10-CM | POA: Insufficient documentation

## 2011-08-01 ENCOUNTER — Encounter: Payer: Self-pay | Admitting: *Deleted

## 2011-08-07 ENCOUNTER — Telehealth: Payer: Self-pay | Admitting: *Deleted

## 2011-08-07 NOTE — Telephone Encounter (Signed)
Called pharmacy to clarify name of med-

## 2011-11-13 ENCOUNTER — Ambulatory Visit (INDEPENDENT_AMBULATORY_CARE_PROVIDER_SITE_OTHER): Payer: Medicare Other | Admitting: Family Medicine

## 2011-11-13 ENCOUNTER — Encounter: Payer: Self-pay | Admitting: Family Medicine

## 2011-11-13 DIAGNOSIS — J069 Acute upper respiratory infection, unspecified: Secondary | ICD-10-CM

## 2011-11-13 MED ORDER — OSELTAMIVIR PHOSPHATE 75 MG PO CAPS: 75.0000 mg | ORAL_CAPSULE | Freq: Two times a day (BID) | ORAL | Status: AC

## 2011-11-13 NOTE — Assessment & Plan Note (Signed)
URI, probably Flu so will treat as such. See instructions.

## 2011-11-13 NOTE — Progress Notes (Signed)
  Subjective:    Patient ID: [name], female    DOB: Aug 06, 1941, 70 y.o.   MRN: [id number]  HPI Pt of Dr Alla German here as acute appt for fever, cough and body aches. She began with her sxs Tues.  She has had fever to 100, 101.6 here. She has headachefrontally and back ache. She denies ear pain, nor rhinitis, no ST, some cough that is minimally productive occas, no N/V, no diarrhea, some body aches, esp her back.  She has taken nothing today, she had been taking Tyl 500 2 three times a day.  She has been taking Tussionex at night.    Review of SystemsNoncontributory except as above.       Objective:   Physical Exam  Constitutional: She appears well-developed and well-nourished. No distress.  HENT:  Head: Normocephalic and atraumatic.  Right Ear: External ear normal.  Left Ear: External ear normal.  Nose: Nose normal.  Mouth/Throat: Oropharynx is clear and moist. No oropharyngeal exudate.       Cerumen in right ear. Tms nml.  Eyes: Conjunctivae and EOM are normal. Pupils are equal, round, and reactive to light.  Neck: Normal range of motion. Neck supple. No thyromegaly present.  Cardiovascular: Normal rate, regular rhythm and normal heart sounds.   Pulmonary/Chest: Effort normal and breath sounds normal. She has no wheezes. She has no rales.       Wet cough but no ronchi, lungs sound clear.  Lymphadenopathy:    She has no cervical adenopathy.  Skin: She is not diaphoretic.          Assessment & Plan:

## 2011-11-13 NOTE — Patient Instructions (Addendum)
Take Tamiflu one twice a day for five days. Cont Tyl 574m 2 three times a day. Take Guaifenesin (4061m, take 11/2 tabs by mouth AM and NOON. Get GUAIFENESIN by  going [facility] [address] [address] and getting MUCOUS RELIEF EXPECTORANT/CONGESTION. DO NOT GET MUCINEX (Timed Release Guaifenesin)  Drink lots of fluids. Keep lozenge in mouth all day long for a few days. Check BP after this resolves, if gtreater than 140/90, return for eval. Use Tussionex at night as needed.

## 2011-12-22 ENCOUNTER — Other Ambulatory Visit: Payer: Self-pay | Admitting: Internal Medicine

## 2011-12-22 NOTE — Telephone Encounter (Signed)
Okay #60 x 0 

## 2011-12-23 NOTE — Telephone Encounter (Signed)
rx called into pharmacy

## 2012-04-16 ENCOUNTER — Ambulatory Visit (INDEPENDENT_AMBULATORY_CARE_PROVIDER_SITE_OTHER): Payer: Medicare Other | Admitting: Internal Medicine

## 2012-04-16 ENCOUNTER — Encounter: Payer: Self-pay | Admitting: Internal Medicine

## 2012-04-16 VITALS — BP 108/60 | HR 66 | Temp 97.5°F | Ht 63.0 in | Wt 128.0 lb

## 2012-04-16 DIAGNOSIS — K589 Irritable bowel syndrome without diarrhea: Secondary | ICD-10-CM | POA: Diagnosis not present

## 2012-04-16 DIAGNOSIS — E785 Hyperlipidemia, unspecified: Secondary | ICD-10-CM | POA: Diagnosis not present

## 2012-04-16 DIAGNOSIS — G43109 Migraine with aura, not intractable, without status migrainosus: Secondary | ICD-10-CM

## 2012-04-16 DIAGNOSIS — F411 Generalized anxiety disorder: Secondary | ICD-10-CM

## 2012-04-16 DIAGNOSIS — G43909 Migraine, unspecified, not intractable, without status migrainosus: Secondary | ICD-10-CM | POA: Insufficient documentation

## 2012-04-16 DIAGNOSIS — Z23 Encounter for immunization: Secondary | ICD-10-CM | POA: Diagnosis not present

## 2012-04-16 DIAGNOSIS — Z1211 Encounter for screening for malignant neoplasm of colon: Secondary | ICD-10-CM

## 2012-04-16 LAB — CBC WITH DIFFERENTIAL/PLATELET
Basophils Relative: 0.5 % (ref 0.0–3.0)
Eosinophils Relative: 3.8 % (ref 0.0–5.0)
HCT: 40.9 % (ref 36.0–46.0)
Hemoglobin: 13.5 g/dL (ref 12.0–15.0)
Lymphs Abs: 1.9 10*3/uL (ref 0.7–4.0)
MCHC: 33 g/dL (ref 30.0–36.0)
MCV: 91.3 fl (ref 78.0–100.0)
Monocytes Absolute: 0.4 10*3/uL (ref 0.1–1.0)
Neutro Abs: 3.4 10*3/uL (ref 1.4–7.7)
Neutrophils Relative %: 57.2 % (ref 43.0–77.0)
RBC: 4.47 Mil/uL (ref 3.87–5.11)
WBC: 6 10*3/uL (ref 4.5–10.5)

## 2012-04-16 LAB — HEPATIC FUNCTION PANEL
ALT: 17 U/L (ref 0–35)
AST: 20 U/L (ref 0–37)
Total Bilirubin: 0.5 mg/dL (ref 0.3–1.2)
Total Protein: 5.9 g/dL — ABNORMAL LOW (ref 6.0–8.3)

## 2012-04-16 LAB — BASIC METABOLIC PANEL WITH GFR
BUN: 11 mg/dL (ref 6–23)
CO2: 27 meq/L (ref 19–32)
Calcium: 8.9 mg/dL (ref 8.4–10.5)
Chloride: 107 meq/L (ref 96–112)
Creatinine, Ser: 0.9 mg/dL (ref 0.4–1.2)
GFR: 69.32 mL/min
Glucose, Bld: 97 mg/dL (ref 70–99)
Potassium: 4.6 meq/L (ref 3.5–5.1)
Sodium: 141 meq/L (ref 135–145)

## 2012-04-16 LAB — LIPID PANEL
Cholesterol: 198 mg/dL (ref 0–200)
HDL: 48.8 mg/dL (ref >39.00)
LDL Cholesterol: 119 mg/dL — ABNORMAL HIGH (ref 0–99)
VLDL: 30.4 mg/dL (ref 0.0–40.0)

## 2012-04-16 LAB — TSH: TSH: 2.88 u[IU]/mL (ref 0.35–5.50)

## 2012-04-16 MED ORDER — PROPRANOLOL HCL 40 MG PO TABS: 40.0000 mg | ORAL_TABLET | Freq: Two times a day (BID) | ORAL | Status: DC

## 2012-04-16 MED ORDER — CLORAZEPATE DIPOTASSIUM 7.5 MG PO TABS: 7.5000 mg | ORAL_TABLET | Freq: Two times a day (BID) | ORAL | Status: DC | PRN

## 2012-04-16 MED ORDER — PRAVASTATIN SODIUM 20 MG PO TABS: 20.0000 mg | ORAL_TABLET | Freq: Every day | ORAL | Status: DC

## 2012-04-16 NOTE — Assessment & Plan Note (Signed)
Tolerable Does well with the clorazepate--this helps her sleep also

## 2012-04-16 NOTE — Assessment & Plan Note (Signed)
No problems with med Due for labs

## 2012-04-16 NOTE — Progress Notes (Signed)
Subjective:    Patient ID: [name], female    DOB: 12-19-1940, 71 y.o.   MRN: [id number]  HPI Had billing issue with wellness visit last year Won't do that this year  No falls  Not much exercise---does go to gym 2 times a week though  Has been using the chlorazepate 2-3 times a week Helps her sleep Keeps her stomach calm Wants to take regularly  Still has swelling in right foot occ pain in each Some stinging and burning at night--ice pack will help some   Still on propranolol Did try cutting back to daily---but then got soreness and early headaches (right temple) Restarted bid again  No problems with the statin Due for labs  Current Outpatient Prescriptions on File Prior to Visit  Medication Sig Dispense Refill  . calcium-vitamin D (OSCAL WITH D 250-125) 250-125 MG-UNIT per tablet Take 1 tablet by mouth daily.        . clorazepate (TRANXENE) 7.5 MG tablet Take 1 tablet (7.5 mg total) by mouth 2 (two) times daily as needed.  60 tablet  0  . docusate sodium (COLACE) 100 MG capsule Take 100 mg by mouth as directed.        Marland Kitchen estradiol (ESTRACE) 0.1 MG/GM vaginal cream Place 2 g vaginally every other day.        . fish oil-omega-3 fatty acids 1000 MG capsule Take 2 g by mouth daily.        . Multiple Vitamin (MULTIVITAMIN) tablet Take 1 tablet by mouth daily.        Marland Kitchen DISCONTD: pravastatin (PRAVACHOL) 20 MG tablet TAKE ONE TABLET BY MOUTH EVERY DAY TO REDUCE CHOLESTEROL  90 tablet  3  . DISCONTD: propranolol (INDERAL) 40 MG tablet Take 1 tablet (40 mg total) by mouth 2 (two) times daily.  180 tablet  3    Allergies  Allergen Reactions  . Clonazepam     REACTION: Swollen lips, sore mouth  . Penicillins     REACTION: unrecalled reaction    Past Medical History  Diagnosis Date  . Anxiety   . GERD (gastroesophageal reflux disease)     Dr. [name]  . HLD (hyperlipidemia)     Dr. Lia Foyer  . Osteopenia 2005  . OA (osteoarthritis)   . IBS (irritable bowel syndrome)       Past Surgical History  Procedure Date  . Cardiac catheterization 1997    okay, mild changes  . Tonsillectomy and adenoidectomy 1953  . Cholecystectomy 1992  . Abdominal hysterectomy 1983  . Appendectomy 1983    Family History  Problem Relation Age of Onset  . Heart attack Father 52  . Diabetes Father   . COPD Mother   . Pulmonary fibrosis Brother 28  . Coronary artery disease Father   . Hypertension Mother   . Hypertension Sister   . Cancer Daughter     breast    History   Social History  . Marital Status: Married    Spouse Name: N/A    Number of Children: N/A  . Years of Education: N/A   Occupational History  . Retired Database administrator asst.    Social History Main Topics  . Smoking status: Never Smoker   . Smokeless tobacco: Never Used  . Alcohol Use: No  . Drug Use: No  . Sexually Active: Not on file   Other Topics Concern  . Not on file   Social History Narrative  . No narrative on file  Review of Systems Weight stable Sleep is still a problem---uses OTC sleep aide Uses estrogen cream about 5 times a week--stopped the clobetasol    Objective:   Physical Exam  Constitutional: She appears well-developed and well-nourished. No distress.  Neck: Normal range of motion. Neck supple. No thyromegaly present.  Cardiovascular: Normal rate, regular rhythm, normal heart sounds and intact distal pulses.  Exam reveals no gallop.   No murmur heard. Pulmonary/Chest: Effort normal and breath sounds normal. No respiratory distress. She has no wheezes. She has no rales.  Abdominal: There is no tenderness.  Genitourinary:       No breast masses or discharge  Musculoskeletal: She exhibits edema. She exhibits no tenderness.       1+ right ankle edema--no pitting  Lymphadenopathy:    She has no cervical adenopathy.    She has no axillary adenopathy.  Skin: No rash noted. No erythema.  Psychiatric: She has a normal mood and affect. Her behavior is  normal.          Assessment & Plan:

## 2012-04-16 NOTE — Assessment & Plan Note (Signed)
Controlled with propranolol prophylaxis

## 2012-04-16 NOTE — Assessment & Plan Note (Signed)
Not a big deal Mostly a GI thing

## 2012-04-19 ENCOUNTER — Other Ambulatory Visit: Payer: Self-pay

## 2012-04-19 MED ORDER — PROPRANOLOL HCL 40 MG PO TABS: 40.0000 mg | ORAL_TABLET | Freq: Two times a day (BID) | ORAL | Status: DC

## 2012-04-19 NOTE — Telephone Encounter (Signed)
Pt request refill propranolol when pt went to [facility] med was not there. Med was sent by phone instead electronic on 04/16/12. I called med to [address] as instructed. Propanolol 40 mg #180 x 3.Pt notified while on phone.

## 2012-04-20 ENCOUNTER — Encounter: Payer: Self-pay | Admitting: *Deleted

## 2012-05-04 ENCOUNTER — Ambulatory Visit (INDEPENDENT_AMBULATORY_CARE_PROVIDER_SITE_OTHER): Payer: Medicare Other | Admitting: Internal Medicine

## 2012-05-04 ENCOUNTER — Encounter: Payer: Self-pay | Admitting: Internal Medicine

## 2012-05-04 VITALS — BP 110/70 | HR 65 | Temp 97.8°F | Wt 129.0 lb

## 2012-05-04 DIAGNOSIS — N760 Acute vaginitis: Secondary | ICD-10-CM | POA: Diagnosis not present

## 2012-05-04 DIAGNOSIS — N399 Disorder of urinary system, unspecified: Secondary | ICD-10-CM

## 2012-05-04 LAB — POCT URINALYSIS DIPSTICK
Ketones, UA: NEGATIVE
Leukocytes, UA: NEGATIVE
Protein, UA: NEGATIVE
Urobilinogen, UA: NEGATIVE
pH, UA: 6

## 2012-05-04 MED ORDER — CLOBETASOL PROPIONATE 0.05 % EX CREA: TOPICAL_CREAM | Freq: Every day | CUTANEOUS | Status: DC | PRN

## 2012-05-04 MED ORDER — FLUCONAZOLE 150 MG PO TABS: 150.0000 mg | ORAL_TABLET | Freq: Once | ORAL | Status: AC

## 2012-05-04 NOTE — Progress Notes (Signed)
Subjective:    Patient ID: [name], female    DOB: 1941/02/22, 71 y.o.   MRN: [id number]  HPI Started having vaginal burning and irritation Felt it was typical of yeast Tried 3 day OTC Rx It did help but still having burning and itching  Has pressure with voiding but thinks she is emptying okay No dysuria No bleeding  Uses estrogen regularly still  Has used clobetasol for her lichen planus  Current Outpatient Prescriptions on File Prior to Visit  Medication Sig Dispense Refill  . calcium-vitamin D (OSCAL WITH D 250-125) 250-125 MG-UNIT per tablet Take 1 tablet by mouth daily.        . clorazepate (TRANXENE) 7.5 MG tablet Take 1 tablet (7.5 mg total) by mouth 2 (two) times daily as needed.  60 tablet  1  . docusate sodium (COLACE) 100 MG capsule Take 100 mg by mouth as directed.        Marland Kitchen estradiol (ESTRACE) 0.1 MG/GM vaginal cream Place 2 g vaginally every other day.        . fish oil-omega-3 fatty acids 1000 MG capsule Take 2 g by mouth daily.        . Multiple Vitamin (MULTIVITAMIN) tablet Take 1 tablet by mouth daily.        . pravastatin (PRAVACHOL) 20 MG tablet Take 1 tablet (20 mg total) by mouth daily.  90 tablet  3  . propranolol (INDERAL) 40 MG tablet Take 1 tablet (40 mg total) by mouth 2 (two) times daily.  180 tablet  3    Allergies  Allergen Reactions  . Clonazepam     REACTION: Swollen lips, sore mouth  . Penicillins     REACTION: unrecalled reaction    Past Medical History  Diagnosis Date  . Anxiety   . GERD (gastroesophageal reflux disease)     Dr. [name]  . HLD (hyperlipidemia)     Dr. Lia Foyer  . Osteopenia 2005  . OA (osteoarthritis)   . IBS (irritable bowel syndrome)     Past Surgical History  Procedure Date  . Cardiac catheterization 1997    okay, mild changes  . Tonsillectomy and adenoidectomy 1953  . Cholecystectomy 1992  . Abdominal hysterectomy 1983  . Appendectomy 1983    Family History  Problem Relation Age of Onset  .  Heart attack Father 64  . Diabetes Father   . COPD Mother   . Pulmonary fibrosis Brother 57  . Coronary artery disease Father   . Hypertension Mother   . Hypertension Sister   . Cancer Daughter     breast    History   Social History  . Marital Status: Married    Spouse Name: N/A    Number of Children: N/A  . Years of Education: N/A   Occupational History  . Retired Database administrator asst.    Social History Main Topics  . Smoking status: Never Smoker   . Smokeless tobacco: Never Used  . Alcohol Use: No  . Drug Use: No  . Sexually Active: Not on file   Other Topics Concern  . Not on file   Social History Narrative  . No narrative on file   Review of Systems No sex now---too much pain No fever No abdominal pain    Objective:   Physical Exam  Constitutional: She appears well-developed and well-nourished. No distress.  Abdominal: Soft. There is no tenderness.  Genitourinary:       Normal introitus No striking atrophy but  mild No apparent discharge No induration or inflammation          Assessment & Plan:

## 2012-05-04 NOTE — Assessment & Plan Note (Addendum)
Likely yeast but can't exclude bacterial vaginosis Will try trial of fluconazole If no better next week, would try metronidazole vaginal  She does take yogurt as probiotic  Lower abdomen is sensitive but symptoms not suggestive or cystitis since urine is negative

## 2012-07-05 ENCOUNTER — Other Ambulatory Visit: Payer: Self-pay | Admitting: Internal Medicine

## 2012-07-05 DIAGNOSIS — Z1231 Encounter for screening mammogram for malignant neoplasm of breast: Secondary | ICD-10-CM

## 2012-08-02 ENCOUNTER — Ambulatory Visit (HOSPITAL_COMMUNITY)
Admission: RE | Admit: 2012-08-02 | Discharge: 2012-08-02 | Disposition: A | Discharge status: Home / Self Care | Payer: Medicare Other | Source: Ambulatory Visit | Attending: Internal Medicine | Admitting: Internal Medicine

## 2012-08-02 DIAGNOSIS — Z1231 Encounter for screening mammogram for malignant neoplasm of breast: Secondary | ICD-10-CM | POA: Insufficient documentation

## 2012-08-02 IMAGING — MG MM DIGITAL SCREENING BILAT
4 series · 4 of 4 positions shown · non-contrast
Comparison: Previous exams.

CLINICAL DATA: Screening.

DIGITAL BILATERAL SCREENING MAMMOGRAM WITH CAD

[R CC]
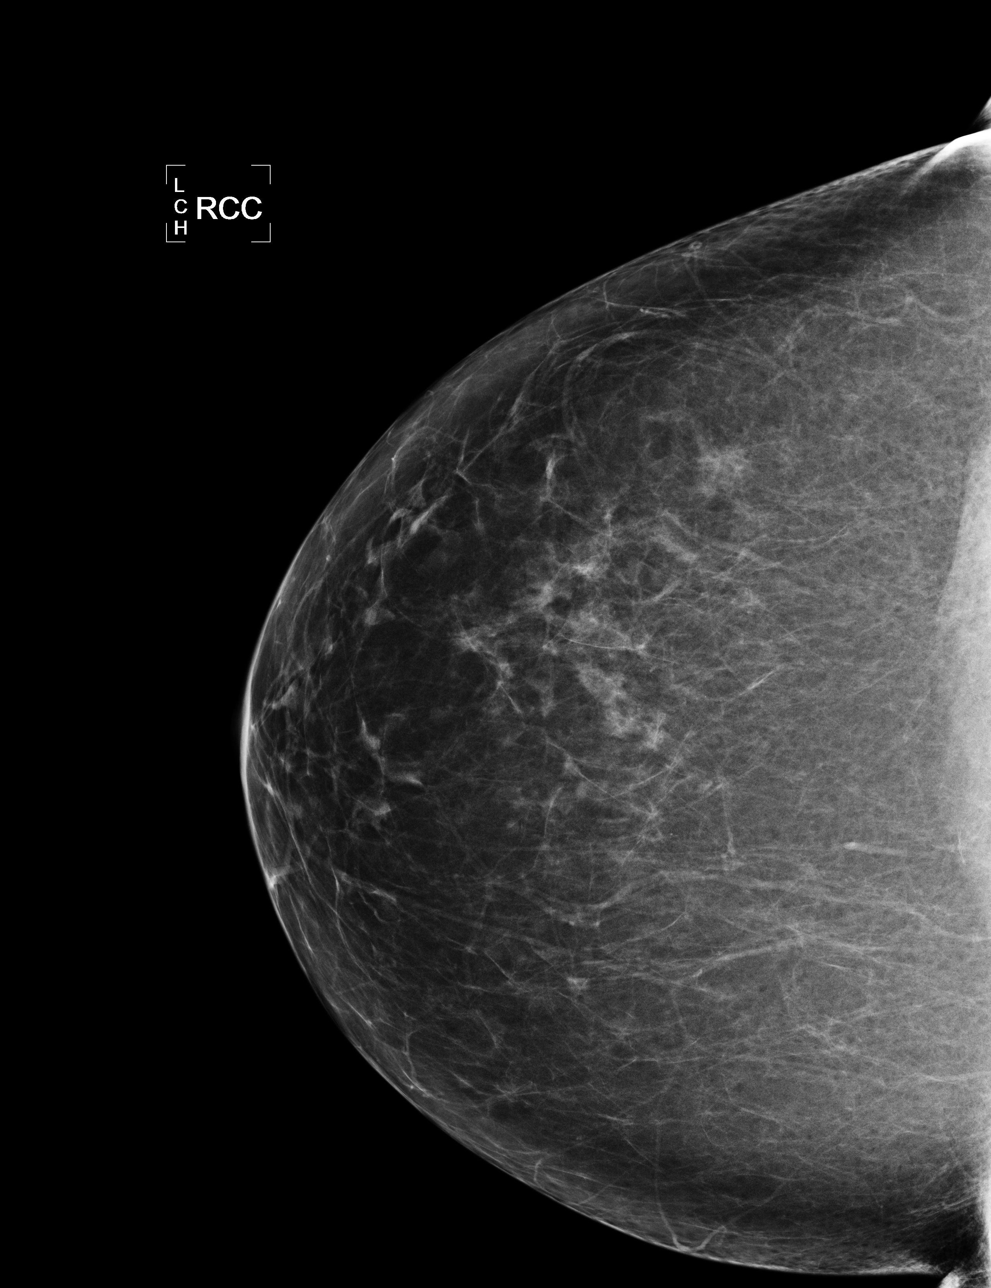

[R MLO]
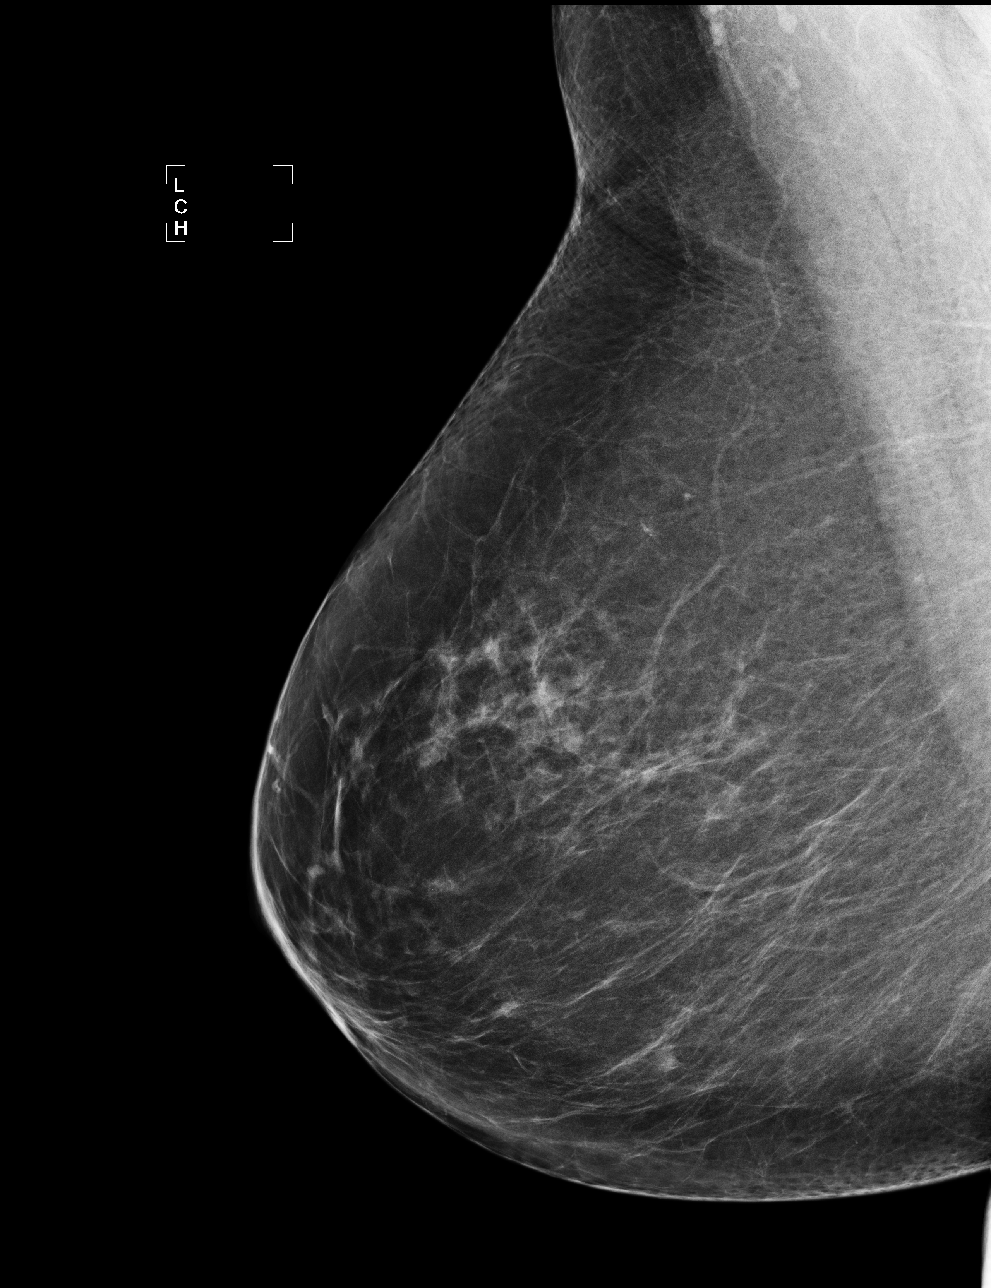

[L CC]
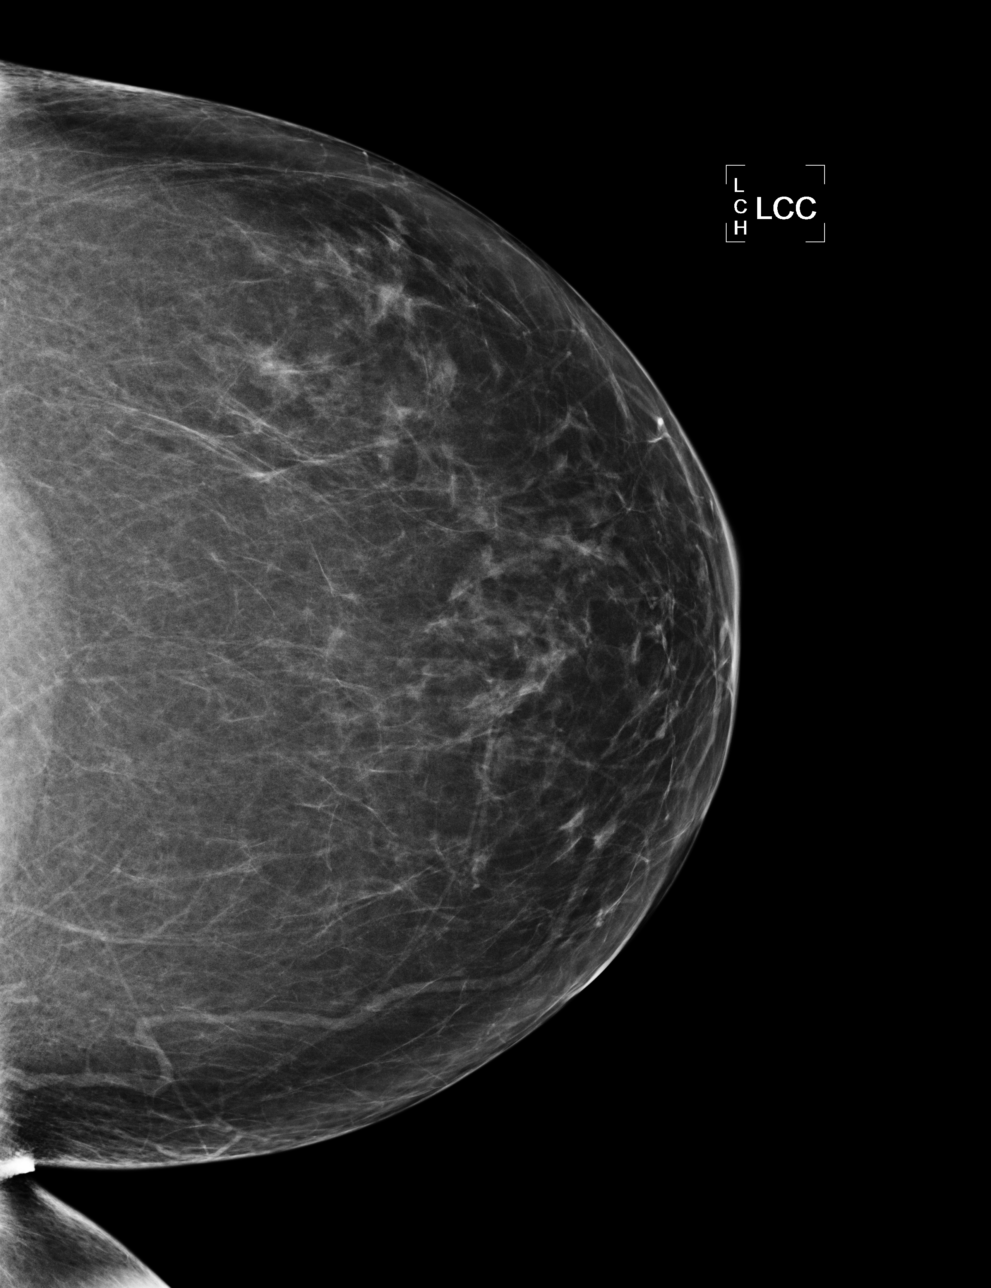

[L MLO]
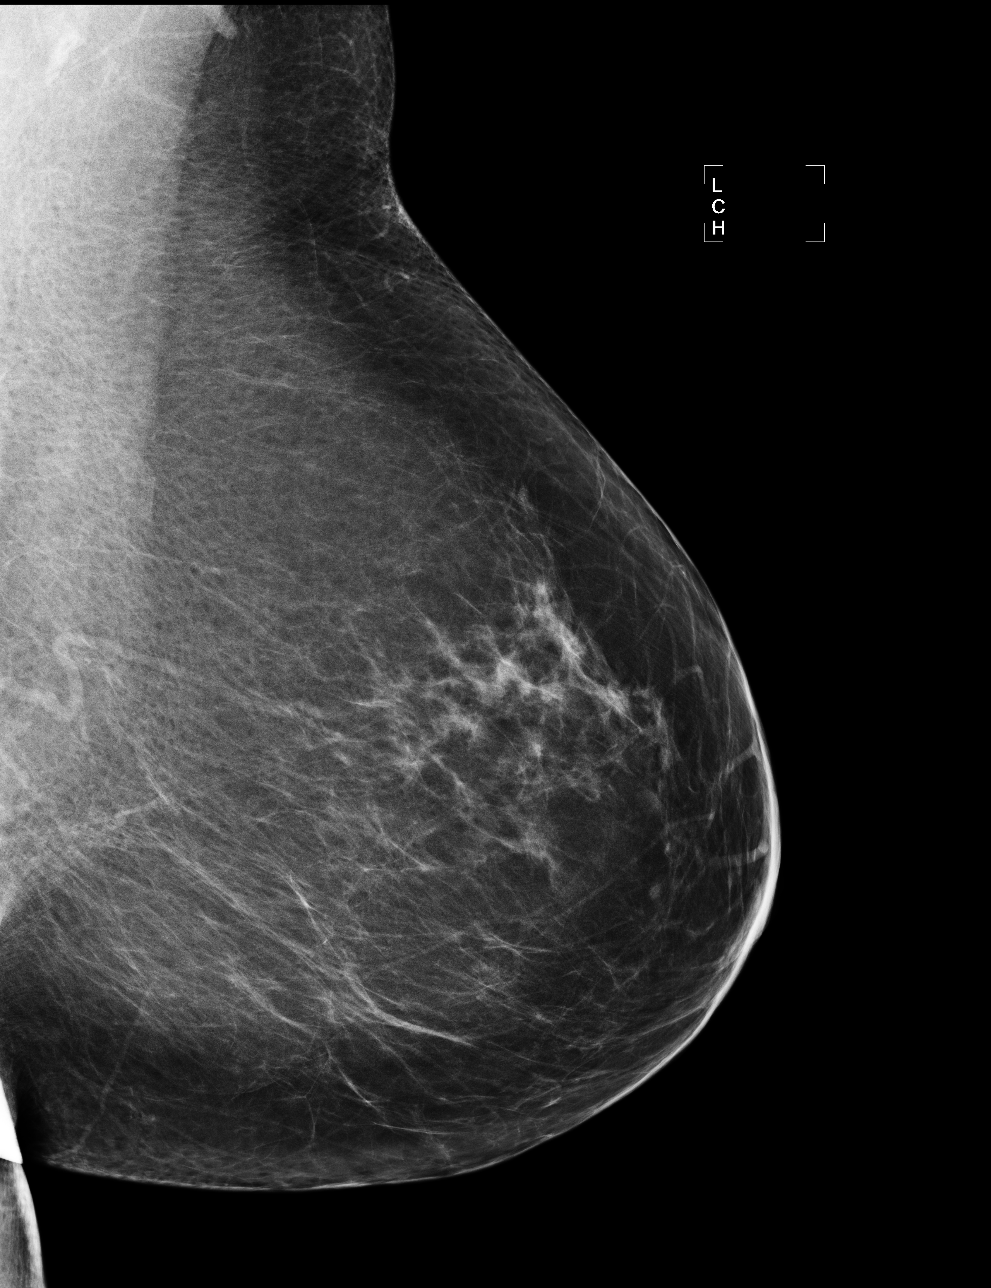

[4 of 4 positions shown; findings below may reference images not displayed]

FINDINGS: There are scattered fibroglandular densities. No
suspicious masses, architectural distortion, or calcifications are
present.

Images were processed with CAD.
IMPRESSION: No mammographic evidence of malignancy.

A result letter of this screening mammogram will be mailed directly
to the patient.

RECOMMENDATION:
Screening mammogram in one year. (Code:[XG])

BI-RADS CATEGORY 1:  Negative.

## 2012-08-03 ENCOUNTER — Encounter: Payer: Self-pay | Admitting: *Deleted

## 2012-08-27 ENCOUNTER — Encounter: Payer: Self-pay | Admitting: Internal Medicine

## 2012-08-27 ENCOUNTER — Ambulatory Visit (INDEPENDENT_AMBULATORY_CARE_PROVIDER_SITE_OTHER): Payer: Medicare Other | Admitting: Internal Medicine

## 2012-08-27 VITALS — BP 138/70 | HR 92 | Temp 97.8°F | Wt 127.0 lb

## 2012-08-27 DIAGNOSIS — R200 Anesthesia of skin: Secondary | ICD-10-CM | POA: Insufficient documentation

## 2012-08-27 DIAGNOSIS — Z23 Encounter for immunization: Secondary | ICD-10-CM | POA: Diagnosis not present

## 2012-08-27 DIAGNOSIS — R209 Unspecified disturbances of skin sensation: Secondary | ICD-10-CM | POA: Diagnosis not present

## 2012-08-27 DIAGNOSIS — R609 Edema, unspecified: Secondary | ICD-10-CM

## 2012-08-27 NOTE — Assessment & Plan Note (Signed)
With some burning pain especially in the mornings Seems to be the major problem Will check labs to see if reversible cause Can consider gabapentin but usually short lived---not sure if the burning is as bad as even the apparent plantar fasciitis

## 2012-08-27 NOTE — Patient Instructions (Signed)
Plantar Fasciitis (Heel Spur Syndrome) with Rehab The plantar fascia is a fibrous, ligament-like, soft-tissue structure that spans the bottom of the foot. Plantar fasciitis is a condition that causes pain in the foot due to inflammation of the tissue. SYMPTOMS   Pain and tenderness on the underneath side of the foot.   Pain that worsens with standing or walking.  CAUSES  Plantar fasciitis is caused by irritation and injury to the plantar fascia on the underneath side of the foot. Common mechanisms of injury include:  Direct trauma to bottom of the foot.   Damage to a small nerve that runs under the foot where the main fascia attaches to the heel bone.   Stress placed on the plantar fascia due to bone spurs.  RISK INCREASES WITH:   Activities that place stress on the plantar fascia (running, jumping, pivoting, or cutting).   Poor strength and flexibility.   Improperly fitted shoes.   Tight calf muscles.   Flat feet.   Failure to warm-up properly before activity.   Obesity.  PREVENTION  Warm up and stretch properly before activity.   Allow for adequate recovery between workouts.   Maintain physical fitness:   Strength, flexibility, and endurance.   Cardiovascular fitness.   Maintain a health body weight.   Avoid stress on the plantar fascia.   Wear properly fitted shoes, including arch supports for individuals who have flat feet.  PROGNOSIS  If treated properly, then the symptoms of plantar fasciitis usually resolve without surgery. However, occasionally surgery is necessary. RELATED COMPLICATIONS   Recurrent symptoms that may result in a chronic condition.   Problems of the lower back that are caused by compensating for the injury, such as limping.   Pain or weakness of the foot during push-off following surgery.   Chronic inflammation, scarring, and partial or complete fascia tear, occurring more often from repeated injections.  TREATMENT  Treatment  initially involves the use of ice and medication to help reduce pain and inflammation. The use of strengthening and stretching exercises may help reduce pain with activity, especially stretches of the Achilles tendon. These exercises may be performed at home or with a therapist. Your caregiver may recommend that you use heel cups of arch supports to help reduce stress on the plantar fascia. Occasionally, corticosteroid injections are given to reduce inflammation. If symptoms persist for greater than 6 months despite non-surgical (conservative), then surgery may be recommended.  MEDICATION   If pain medication is necessary, then nonsteroidal anti-inflammatory medications, such as aspirin and ibuprofen, or other minor pain relievers, such as acetaminophen, are often recommended.   Do not take pain medication within 7 days before surgery.   Prescription pain relievers may be given if deemed necessary by your caregiver. Use only as directed and only as much as you need.   Corticosteroid injections may be given by your caregiver. These injections should be reserved for the most serious cases, because they may only be given a certain number of times.  HEAT AND COLD  Cold treatment (icing) relieves pain and reduces inflammation. Cold treatment should be applied for 10 to 15 minutes every 2 to 3 hours for inflammation and pain and immediately after any activity that aggravates your symptoms. Use ice packs or massage the area with a piece of ice (ice massage).   Heat treatment may be used prior to performing the stretching and strengthening activities prescribed by your caregiver, physical therapist, or athletic trainer. Use a heat pack or soak the  injury in warm water.  SEEK IMMEDIATE MEDICAL CARE IF:  Treatment seems to offer no benefit, or the condition worsens.   Any medications produce adverse side effects.  EXERCISES RANGE OF MOTION (ROM) AND STRETCHING EXERCISES - Plantar Fasciitis (Heel Spur  Syndrome) These exercises may help you when beginning to rehabilitate your injury. Your symptoms may resolve with or without further involvement from your physician, physical therapist or athletic trainer. While completing these exercises, remember:   Restoring tissue flexibility helps normal motion to return to the joints. This allows healthier, less painful movement and activity.   An effective stretch should be held for at least 30 seconds.   A stretch should never be painful. You should only feel a gentle lengthening or release in the stretched tissue.  RANGE OF MOTION - Toe Extension, Flexion  Sit with your right / left leg crossed over your opposite knee.   Grasp your toes and gently pull them back toward the top of your foot. You should feel a stretch on the bottom of your toes and/or foot.   Hold this stretch for __________ seconds.   Now, gently pull your toes toward the bottom of your foot. You should feel a stretch on the top of your toes and or foot.   Hold this stretch for __________ seconds.  Repeat __________ times. Complete this stretch __________ times per day.  RANGE OF MOTION - Ankle Dorsiflexion, Active Assisted  Remove shoes and sit on a chair that is preferably not on a carpeted surface.   Place right / left foot under knee. Extend your opposite leg for support.   Keeping your heel down, slide your right / left foot back toward the chair until you feel a stretch at your ankle or calf. If you do not feel a stretch, slide your bottom forward to the edge of the chair, while still keeping your heel down.   Hold this stretch for __________ seconds.  Repeat __________ times. Complete this stretch __________ times per day.  STRETCH - Gastroc, Standing  Place hands on wall.   Extend right / left leg, keeping the front knee somewhat bent.   Slightly point your toes inward on your back foot.   Keeping your right / left heel on the floor and your knee straight, shift  your weight toward the wall, not allowing your back to arch.   You should feel a gentle stretch in the right / left calf. Hold this position for __________ seconds.  Repeat __________ times. Complete this stretch __________ times per day. STRETCH - Soleus, Standing  Place hands on wall.   Extend right / left leg, keeping the other knee somewhat bent.   Slightly point your toes inward on your back foot.   Keep your right / left heel on the floor, bend your back knee, and slightly shift your weight over the back leg so that you feel a gentle stretch deep in your back calf.   Hold this position for __________ seconds.  Repeat __________ times. Complete this stretch __________ times per day. STRETCH - Gastrocsoleus, Standing  Note: This exercise can place a lot of stress on your foot and ankle. Please complete this exercise only if specifically instructed by your caregiver.   Place the ball of your right / left foot on a step, keeping your other foot firmly on the same step.   Hold on to the wall or a rail for balance.   Slowly lift your other foot, allowing your  body weight to press your heel down over the edge of the step.   You should feel a stretch in your right / left calf.   Hold this position for __________ seconds.   Repeat this exercise with a slight bend in your right / left knee.  Repeat __________ times. Complete this stretch __________ times per day.  STRENGTHENING EXERCISES - Plantar Fasciitis (Heel Spur Syndrome)  These exercises may help you when beginning to rehabilitate your injury. They may resolve your symptoms with or without further involvement from your physician, physical therapist or athletic trainer. While completing these exercises, remember:   Muscles can gain both the endurance and the strength needed for everyday activities through controlled exercises.   Complete these exercises as instructed by your physician, physical therapist or athletic trainer.  Progress the resistance and repetitions only as guided.  STRENGTH - Towel Curls  Sit in a chair positioned on a non-carpeted surface.   Place your foot on a towel, keeping your heel on the floor.   Pull the towel toward your heel by only curling your toes. Keep your heel on the floor.   If instructed by your physician, physical therapist or athletic trainer, add ____________________ at the end of the towel.  Repeat __________ times. Complete this exercise __________ times per day. STRENGTH - Ankle Inversion  Secure one end of a rubber exercise band/tubing to a fixed object (table, pole). Loop the other end around your foot just before your toes.   Place your fists between your knees. This will focus your strengthening at your ankle.   Slowly, pull your big toe up and in, making sure the band/tubing is positioned to resist the entire motion.   Hold this position for __________ seconds.   Have your muscles resist the band/tubing as it slowly pulls your foot back to the starting position.  Repeat __________ times. Complete this exercises __________ times per day.  Document Released: 11/24/2005 Document Revised: 11/13/2011 Document Reviewed: 03/08/2009 [facility] Patient Information 2012 [place].

## 2012-08-27 NOTE — Progress Notes (Signed)
Subjective:    Patient ID: [name], female    DOB: Aug 23, 1941, 71 y.o.   MRN: [id number]  HPI Having more trouble with her feet Chronic swelling in right foot Now with some left foot swelling for several months With both swollen, she is having some trouble walking Notes burning in the morning along with swelling Tingling and itching as well---mostly morning and late at night  Pain can radiate up calves to knees Increased pain in left heel---hard to put it down Right great toe is very painful---sensitive even to light touch (under distal nail) No falls or trauma  Sleeps okay No problems with the burning pain in bed usually No similar symptoms in hands  Current Outpatient Prescriptions on File Prior to Visit  Medication Sig Dispense Refill  . calcium-vitamin D (OSCAL WITH D 250-125) 250-125 MG-UNIT per tablet Take 1 tablet by mouth daily.        . clobetasol cream (TEMOVATE) 0.05 % Apply topically daily as needed.  15 g  0  . clorazepate (TRANXENE) 7.5 MG tablet Take 1 tablet (7.5 mg total) by mouth 2 (two) times daily as needed.  60 tablet  1  . docusate sodium (COLACE) 100 MG capsule Take 100 mg by mouth as directed.        Marland Kitchen estradiol (ESTRACE) 0.1 MG/GM vaginal cream Place 2 g vaginally every other day.        . fish oil-omega-3 fatty acids 1000 MG capsule Take 2 g by mouth daily.        . Multiple Vitamin (MULTIVITAMIN) tablet Take 1 tablet by mouth daily.        . pravastatin (PRAVACHOL) 20 MG tablet Take 1 tablet (20 mg total) by mouth daily.  90 tablet  3  . propranolol (INDERAL) 40 MG tablet Take 1 tablet (40 mg total) by mouth 2 (two) times daily.  180 tablet  3    Allergies  Allergen Reactions  . Clonazepam     REACTION: Swollen lips, sore mouth  . Penicillins     REACTION: unrecalled reaction    Past Medical History  Diagnosis Date  . Anxiety   . GERD (gastroesophageal reflux disease)     Dr. [name]  . HLD (hyperlipidemia)     Dr. Lia Foyer  .  Osteopenia 2005  . OA (osteoarthritis)   . IBS (irritable bowel syndrome)     Past Surgical History  Procedure Date  . Cardiac catheterization 1997    okay, mild changes  . Tonsillectomy and adenoidectomy 1953  . Cholecystectomy 1992  . Abdominal hysterectomy 1983  . Appendectomy 1983    Family History  Problem Relation Age of Onset  . Heart attack Father 65  . Diabetes Father   . COPD Mother   . Pulmonary fibrosis Brother 38  . Coronary artery disease Father   . Hypertension Mother   . Hypertension Sister   . Cancer Daughter     breast    History   Social History  . Marital Status: Married    Spouse Name: N/A    Number of Children: N/A  . Years of Education: N/A   Occupational History  . Retired Database administrator asst.    Social History Main Topics  . Smoking status: Never Smoker   . Smokeless tobacco: Never Used  . Alcohol Use: No  . Drug Use: No  . Sexually Active: Not on file   Other Topics Concern  . Not on file   Social  History Narrative  . No narrative on file   Review of Systems No fever No chest pain No SOB     Objective:   Physical Exam  Constitutional: She appears well-developed and well-nourished. No distress.  Neck: Normal range of motion. Neck supple. No thyromegaly present.  Cardiovascular: Normal rate, regular rhythm, normal heart sounds and intact distal pulses.  Exam reveals no gallop.   No murmur heard. Pulmonary/Chest: Effort normal and breath sounds normal. No respiratory distress. She has no wheezes. She has no rales.  Abdominal: Soft. She exhibits no mass. There is no tenderness.       No HSM  Musculoskeletal: She exhibits edema.       2+ ankle edema on right 1+ on left No calf tenderness Moderate left heel tenderness  Lymphadenopathy:    She has no cervical adenopathy.  Neurological:       Mild decreased fine touch sensation in plantar feet  Skin: No rash noted.  Psychiatric: She has a normal mood and  affect. Her behavior is normal.          Assessment & Plan:

## 2012-08-27 NOTE — Assessment & Plan Note (Signed)
Now evident in left foot as well as right Not sure if it is related to heel symptoms or apparent neuropathic symptoms Doesn't seem to be bad enough to need diuretics  She will try vick's under that first right toe

## 2012-08-28 LAB — CBC WITH DIFFERENTIAL/PLATELET
Basophils Relative: 0 % (ref 0–1)
Eosinophils Absolute: 0.3 10*3/uL (ref 0.0–0.7)
HCT: 37.8 % (ref 36.0–46.0)
Hemoglobin: 13.2 g/dL (ref 12.0–15.0)
Lymphs Abs: 2.5 10*3/uL (ref 0.7–4.0)
MCH: 30.1 pg (ref 26.0–34.0)
MCHC: 34.9 g/dL (ref 30.0–36.0)
MCV: 86.1 fL (ref 78.0–100.0)
Monocytes Absolute: 0.6 10*3/uL (ref 0.1–1.0)
Monocytes Relative: 9 % (ref 3–12)

## 2012-08-28 LAB — BASIC METABOLIC PANEL
CO2: 29 mEq/L (ref 19–32)
Chloride: 101 mEq/L (ref 96–112)
Potassium: 4.8 mEq/L (ref 3.5–5.3)
Sodium: 137 mEq/L (ref 135–145)

## 2012-08-28 LAB — HEPATIC FUNCTION PANEL
ALT: 12 U/L (ref 0–35)
Alkaline Phosphatase: 55 U/L (ref 39–117)
Bilirubin, Direct: <0.1 mg/dL (ref 0.0–0.3)
Total Protein: 5.6 g/dL — ABNORMAL LOW (ref 6.0–8.3)

## 2012-08-28 LAB — SEDIMENTATION RATE: Sed Rate: 4 mm/hr (ref 0–22)

## 2012-10-15 ENCOUNTER — Encounter: Payer: Self-pay | Admitting: Internal Medicine

## 2012-10-15 ENCOUNTER — Ambulatory Visit (INDEPENDENT_AMBULATORY_CARE_PROVIDER_SITE_OTHER): Payer: Medicare Other | Admitting: Internal Medicine

## 2012-10-15 VITALS — BP 102/60 | HR 60 | Temp 98.0°F | Wt 128.0 lb

## 2012-10-15 DIAGNOSIS — E538 Deficiency of other specified B group vitamins: Secondary | ICD-10-CM

## 2012-10-15 DIAGNOSIS — R1030 Lower abdominal pain, unspecified: Secondary | ICD-10-CM

## 2012-10-15 DIAGNOSIS — R109 Unspecified abdominal pain: Secondary | ICD-10-CM

## 2012-10-15 MED ORDER — OXYCODONE-ACETAMINOPHEN 5-325 MG PO TABS: 1.0000 | ORAL_TABLET | Freq: Four times a day (QID) | ORAL | Status: DC | PRN

## 2012-10-15 NOTE — Progress Notes (Signed)
Subjective:    Patient ID: [name], female    DOB: Apr 19, 1941, 71 y.o.   MRN: [id number]  HPI The burning is better Some pain in feet still though Did have 2-3 days of pain above left lateral malleolus  Taking the vitamin B12 Still with swelling at times  Was on her feet a lot 1 week ago Got severe cramps on both lower lateral calves Had to get up and bathe them with hot water  Has had 2 bad bouts of abdominal pain October 1-4 and the other 23rd to 26th Diarrhea (gushing), pain across the lower abdomen No blood in stool Was ready to go to ER the second time but 1 oxycodone from her daughter aborted it She asks for supply of this Vomited with the first episode No fever  Current Outpatient Prescriptions on File Prior to Visit  Medication Sig Dispense Refill  . calcium-vitamin D (OSCAL WITH D 250-125) 250-125 MG-UNIT per tablet Take 1 tablet by mouth daily.        . clobetasol cream (TEMOVATE) 0.05 % Apply topically daily as needed.  15 g  0  . clorazepate (TRANXENE) 7.5 MG tablet Take 1 tablet (7.5 mg total) by mouth 2 (two) times daily as needed.  60 tablet  1  . docusate sodium (COLACE) 100 MG capsule Take 100 mg by mouth as directed.        Marland Kitchen estradiol (ESTRACE) 0.1 MG/GM vaginal cream Place 2 g vaginally every other day.        . fish oil-omega-3 fatty acids 1000 MG capsule Take 2 g by mouth daily.        . Multiple Vitamin (MULTIVITAMIN) tablet Take 1 tablet by mouth daily.        . pravastatin (PRAVACHOL) 20 MG tablet Take 1 tablet (20 mg total) by mouth daily.  90 tablet  3  . propranolol (INDERAL) 40 MG tablet Take 1 tablet (40 mg total) by mouth 2 (two) times daily.  180 tablet  3    Allergies  Allergen Reactions  . Clonazepam     REACTION: Swollen lips, sore mouth  . Penicillins     REACTION: unrecalled reaction    Past Medical History  Diagnosis Date  . Anxiety   . GERD (gastroesophageal reflux disease)     Dr. [name]  . HLD (hyperlipidemia)    Dr. Lia Foyer  . Osteopenia 2005  . OA (osteoarthritis)   . IBS (irritable bowel syndrome)     Past Surgical History  Procedure Date  . Cardiac catheterization 1997    okay, mild changes  . Tonsillectomy and adenoidectomy 1953  . Cholecystectomy 1992  . Abdominal hysterectomy 1983  . Appendectomy 1983    Family History  Problem Relation Age of Onset  . Heart attack Father 36  . Diabetes Father   . COPD Mother   . Pulmonary fibrosis Brother 2  . Coronary artery disease Father   . Hypertension Mother   . Hypertension Sister   . Cancer Daughter     breast    History   Social History  . Marital Status: Married    Spouse Name: N/A    Number of Children: N/A  . Years of Education: N/A   Occupational History  . Retired Database administrator asst.    Social History Main Topics  . Smoking status: Never Smoker   . Smokeless tobacco: Never Used  . Alcohol Use: No  . Drug Use: No  . Sexually Active:  Not on file   Other Topics Concern  . Not on file   Social History Narrative  . No narrative on file   Review of Systems Still feels propranolol is working for her migraines. Will get aura of pain over eye but no generalization Sleeping slightly better--- 5-6 hours per night. Takes sleep aide still (or chlorazepate)    Objective:   Physical Exam  Constitutional: She appears well-developed and well-nourished. No distress.  Neck: Normal range of motion.  Pulmonary/Chest: Effort normal and breath sounds normal. No respiratory distress. She has no wheezes. She has no rales.  Abdominal: Soft. Bowel sounds are normal. She exhibits no distension and no mass. There is no tenderness. There is no rebound and no guarding.  Musculoskeletal: She exhibits edema.       1+ ankle edema  R>L  Lymphadenopathy:    She has no cervical adenopathy.  Psychiatric: She has a normal mood and affect. Her behavior is normal.          Assessment & Plan:

## 2012-10-15 NOTE — Assessment & Plan Note (Signed)
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

## 2012-10-15 NOTE — Assessment & Plan Note (Signed)
No specific 2 bouts of several days ?diverticular disease Doesn't have gallbladder  Will give small Rx of oxycodone for prn use Has colonoscopy coming up

## 2012-10-19 ENCOUNTER — Encounter: Payer: Self-pay | Admitting: *Deleted

## 2012-10-22 DIAGNOSIS — H2510 Age-related nuclear cataract, unspecified eye: Secondary | ICD-10-CM | POA: Diagnosis not present

## 2012-12-15 ENCOUNTER — Ambulatory Visit (INDEPENDENT_AMBULATORY_CARE_PROVIDER_SITE_OTHER): Payer: Medicare Other | Admitting: Internal Medicine

## 2012-12-15 ENCOUNTER — Ambulatory Visit: Payer: Medicare Other | Admitting: Family Medicine

## 2012-12-15 ENCOUNTER — Encounter: Payer: Self-pay | Admitting: Internal Medicine

## 2012-12-15 VITALS — BP 122/70 | HR 56 | Temp 98.8°F | Wt 123.0 lb

## 2012-12-15 DIAGNOSIS — J019 Acute sinusitis, unspecified: Secondary | ICD-10-CM | POA: Diagnosis not present

## 2012-12-15 MED ORDER — HYDROCODONE-HOMATROPINE 5-1.5 MG/5ML PO SYRP: 5.0000 mL | ORAL_SOLUTION | Freq: Every evening | ORAL | Status: DC | PRN

## 2012-12-15 MED ORDER — CLINDAMYCIN HCL 300 MG PO CAPS: 300.0000 mg | ORAL_CAPSULE | Freq: Three times a day (TID) | ORAL | Status: DC

## 2012-12-15 NOTE — Assessment & Plan Note (Signed)
History suggests secondary bacterial infection now Discussed analgesics Cough med clindamycin

## 2012-12-15 NOTE — Progress Notes (Signed)
Subjective:    Patient ID: [name], female    DOB: [birth year], 72 y.o.   MRN: [id number]  HPI Sick for almost a week Exposed to people at her work at [employer] with hoarseness Mildly sick with head drainage and then cough Tried tylenol, ibuprofen and cough meds Still with coarse cough  Did have fever 2 days ago Some improvement yesterday but worse today Occasional yellow sputum Mostly clear nasal mucus No overt SOB but has had trouble catching breath with severe cough  Current Outpatient Prescriptions on File Prior to Visit  Medication Sig Dispense Refill  . calcium-vitamin D (OSCAL WITH D 250-125) 250-125 MG-UNIT per tablet Take 1 tablet by mouth daily.        . clobetasol cream (TEMOVATE) 0.05 % Apply topically daily as needed.  15 g  0  . clorazepate (TRANXENE) 7.5 MG tablet Take 1 tablet (7.5 mg total) by mouth 2 (two) times daily as needed.  60 tablet  1  . docusate sodium (COLACE) 100 MG capsule Take 100 mg by mouth as directed.        Marland Kitchen estradiol (ESTRACE) 0.1 MG/GM vaginal cream Place 2 g vaginally every other day.        . fish oil-omega-3 fatty acids 1000 MG capsule Take 2 g by mouth daily.        . Multiple Vitamin (MULTIVITAMIN) tablet Take 1 tablet by mouth daily.        Marland Kitchen oxyCODONE-acetaminophen (PERCOCET/ROXICET) 5-325 MG per tablet Take 1 tablet by mouth every 6 (six) hours as needed for pain.  20 tablet  0  . pravastatin (PRAVACHOL) 20 MG tablet Take 1 tablet (20 mg total) by mouth daily.  90 tablet  3  . propranolol (INDERAL) 40 MG tablet Take 1 tablet (40 mg total) by mouth 2 (two) times daily.  180 tablet  3  . vitamin B-12 (CYANOCOBALAMIN) 1000 MCG tablet Take 1,000 mcg by mouth daily.        Allergies  Allergen Reactions  . Clonazepam     REACTION: Swollen lips, sore mouth  . Penicillins     REACTION: unrecalled reaction    Past Medical History  Diagnosis Date  . Anxiety   . GERD (gastroesophageal reflux disease)     Dr.  [name]  . HLD (hyperlipidemia)     Dr. Lia Foyer  . Osteopenia 2005  . OA (osteoarthritis)   . IBS (irritable bowel syndrome)     Past Surgical History  Procedure Date  . Cardiac catheterization 1997    okay, mild changes  . Tonsillectomy and adenoidectomy 1953  . Cholecystectomy 1992  . Abdominal hysterectomy 1983  . Appendectomy 1983    Family History  Problem Relation Age of Onset  . Heart attack Father 67  . Diabetes Father   . COPD Mother   . Pulmonary fibrosis Brother 74  . Coronary artery disease Father   . Hypertension Mother   . Hypertension Sister   . Cancer Daughter     breast    History   Social History  . Marital Status: Married    Spouse Name: N/A    Number of Children: N/A  . Years of Education: N/A   Occupational History  . Retired Database administrator asst.    Social History Main Topics  . Smoking status: Never Smoker   . Smokeless tobacco: Never Used  . Alcohol Use: No  . Drug Use: No  . Sexually Active: Not on file  Other Topics Concern  . Not on file   Social History Narrative  . No narrative on file   Review of Systems No rash No vomiting Loose stool this AM-- x2.  Appetite is off    Objective:   Physical Exam  Constitutional: She appears well-developed and well-nourished. No distress.       Frequent coarse cough  HENT:       Mild maxillary tenderness Moderate nasal inflammation TMS normal Mild pharyngeal injection  Neck: Normal range of motion. Neck supple.  Pulmonary/Chest: Effort normal and breath sounds normal. No respiratory distress. She has no wheezes. She has no rales.  Lymphadenopathy:    She has no cervical adenopathy.          Assessment & Plan:

## 2013-01-17 ENCOUNTER — Encounter: Payer: Self-pay | Admitting: Internal Medicine

## 2013-02-18 ENCOUNTER — Encounter: Payer: Medicare Other | Admitting: Internal Medicine

## 2013-04-11 ENCOUNTER — Encounter: Payer: Self-pay | Admitting: Internal Medicine

## 2013-04-26 ENCOUNTER — Encounter: Payer: Self-pay | Admitting: Radiology

## 2013-04-27 ENCOUNTER — Ambulatory Visit (AMBULATORY_SURGERY_CENTER): Payer: Medicare Other | Admitting: *Deleted

## 2013-04-27 ENCOUNTER — Encounter: Payer: Self-pay | Admitting: Internal Medicine

## 2013-04-27 ENCOUNTER — Ambulatory Visit (INDEPENDENT_AMBULATORY_CARE_PROVIDER_SITE_OTHER): Payer: Medicare Other | Admitting: Internal Medicine

## 2013-04-27 VITALS — Ht 62.5 in | Wt 122.8 lb

## 2013-04-27 VITALS — BP 130/70 | HR 65 | Temp 98.3°F | Ht 62.5 in | Wt 122.0 lb

## 2013-04-27 DIAGNOSIS — Z1211 Encounter for screening for malignant neoplasm of colon: Secondary | ICD-10-CM

## 2013-04-27 DIAGNOSIS — F411 Generalized anxiety disorder: Secondary | ICD-10-CM

## 2013-04-27 DIAGNOSIS — E785 Hyperlipidemia, unspecified: Secondary | ICD-10-CM | POA: Diagnosis not present

## 2013-04-27 DIAGNOSIS — G43109 Migraine with aura, not intractable, without status migrainosus: Secondary | ICD-10-CM | POA: Diagnosis not present

## 2013-04-27 DIAGNOSIS — Z0000 Encounter for general adult medical examination without abnormal findings: Secondary | ICD-10-CM

## 2013-04-27 DIAGNOSIS — Z1331 Encounter for screening for depression: Secondary | ICD-10-CM | POA: Diagnosis not present

## 2013-04-27 LAB — LDL CHOLESTEROL, DIRECT: Direct LDL: 104.5 mg/dL

## 2013-04-27 LAB — LIPID PANEL
Total CHOL/HDL Ratio: 5
Triglycerides: 232 mg/dL — ABNORMAL HIGH (ref 0.0–149.0)
VLDL: 46.4 mg/dL — ABNORMAL HIGH (ref 0.0–40.0)

## 2013-04-27 MED ORDER — CLORAZEPATE DIPOTASSIUM 7.5 MG PO TABS: 7.5000 mg | ORAL_TABLET | Freq: Two times a day (BID) | ORAL | Status: DC | PRN

## 2013-04-27 MED ORDER — PRAVASTATIN SODIUM 20 MG PO TABS: 20.0000 mg | ORAL_TABLET | Freq: Every day | ORAL | Status: DC

## 2013-04-27 MED ORDER — MOVIPREP 100 G PO SOLR: ORAL | Status: DC

## 2013-04-27 MED ORDER — PROPRANOLOL HCL 40 MG PO TABS: 40.0000 mg | ORAL_TABLET | Freq: Two times a day (BID) | ORAL | Status: DC

## 2013-04-27 NOTE — Assessment & Plan Note (Signed)
Uses the tranxene mostly for her IBS

## 2013-04-27 NOTE — Assessment & Plan Note (Signed)
No problems with the med Due for lab

## 2013-04-27 NOTE — Progress Notes (Signed)
Subjective:    Patient ID: [name], female    DOB: 05/08/1941, 72 y.o.   MRN: [id number]  HPI Here for Medicare wellness Does see gynecologist---follows up for lichen sclerosis. Uses premarin about twice a week Has hearing aides Vision is okay---recent exam No tobacco and doesn't drink Exercises some No falls No depression or anhedonia Had DEXA in 2005---barely abnormal Independent in all ADLs and instrumental activities No major memory issues  Continues on statin No sig muscle aching or stomach trouble  Gets occasional painful stomach pain attacks Will rarely take the oxycodone  Still on the propranolol Tries to wean off and got migraine prodrome Staying on it  Anxiety is episodic Uses to ease stomach mostly No depression  Current Outpatient Prescriptions on File Prior to Visit  Medication Sig Dispense Refill  . calcium-vitamin D (OSCAL WITH D 250-125) 250-125 MG-UNIT per tablet Take 1 tablet by mouth daily.        . clorazepate (TRANXENE) 7.5 MG tablet Take 1 tablet (7.5 mg total) by mouth 2 (two) times daily as needed.  60 tablet  1  . docusate sodium (COLACE) 100 MG capsule Take 100 mg by mouth as directed.        Marland Kitchen estradiol (ESTRACE) 0.1 MG/GM vaginal cream Place 2 g vaginally every other day.        . fish oil-omega-3 fatty acids 1000 MG capsule Take 2 g by mouth daily.        . Multiple Vitamin (MULTIVITAMIN) tablet Take 1 tablet by mouth daily.        Marland Kitchen oxyCODONE-acetaminophen (PERCOCET/ROXICET) 5-325 MG per tablet Take 1 tablet by mouth every 6 (six) hours as needed for pain.  20 tablet  0  . vitamin B-12 (CYANOCOBALAMIN) 1000 MCG tablet Take 1,000 mcg by mouth daily.       No current facility-administered medications on file prior to visit.    Allergies  Allergen Reactions  . Clonazepam     REACTION: Swollen lips, sore mouth  . Penicillins     REACTION: unrecalled reaction    Past Medical History  Diagnosis Date  . Anxiety   . GERD  (gastroesophageal reflux disease)     Dr. [name]  . HLD (hyperlipidemia)     Dr. Lia Foyer  . Osteopenia 2005  . OA (osteoarthritis)   . IBS (irritable bowel syndrome)     Past Surgical History  Procedure Laterality Date  . Cardiac catheterization  1997    okay, mild changes  . Tonsillectomy and adenoidectomy  1953  . Cholecystectomy  1992  . Abdominal hysterectomy  1983  . Appendectomy  1983  . Dexa N/A 2005    borderline osteopenia    Family History  Problem Relation Age of Onset  . Heart attack Father 34  . Diabetes Father   . Coronary artery disease Father   . COPD Mother   . Hypertension Mother   . Pulmonary fibrosis Brother 32  . Hypertension Sister   . Cancer Daughter     breast  . Rheum arthritis Daughter     History   Social History  . Marital Status: Married    Spouse Name: N/A    Number of Children: N/A  . Years of Education: N/A   Occupational History  . Retired Database administrator asst.    Social History Main Topics  . Smoking status: Never Smoker   . Smokeless tobacco: Never Used  . Alcohol Use: No  . Drug Use: No  .  Sexually Active: Not on file   Other Topics Concern  . Not on file   Social History Narrative   Has living will   Husband, then daughters, are [facility] health care POA   Would accept resuscitation attempts but no prolonged artifical ventilation   No prolonged tube feeds if cognitively impaired   Review of Systems Appetite is fine Weight stable Bowels are slow at times---uses stool softener. Occ gets "explosions of diarrhea" Has colonoscopy scheduled in June    Objective:   Physical Exam  Constitutional: She is oriented to person, place, and time. She appears well-developed and well-nourished. No distress.  HENT:  Mouth/Throat: Oropharynx is clear and moist. No oropharyngeal exudate.  Neck: Normal range of motion. Neck supple. No thyromegaly present.  Cardiovascular: Normal rate, regular rhythm, normal heart sounds  and intact distal pulses.  Exam reveals no gallop.   No murmur heard. Pulmonary/Chest: Effort normal and breath sounds normal. No respiratory distress. She has no wheezes. She has no rales.  Abdominal: Soft. There is no tenderness.  Genitourinary:  Periareolar cystic changes bilaterally  Musculoskeletal: She exhibits no edema and no tenderness.  Lymphadenopathy:    She has no cervical adenopathy.    She has no axillary adenopathy.  Neurological: She is alert and oriented to person, place, and time.  President-- "Ophelia Shoulder, [place]" [phone] [id number] Recall 3/3  Psychiatric: She has a normal mood and affect. Her behavior is normal.          Assessment & Plan:

## 2013-04-27 NOTE — Assessment & Plan Note (Signed)
I have personally reviewed the Medicare Annual Wellness questionnaire and have noted 1. The patient's medical and social history 2. Their use of alcohol, tobacco or illicit drugs 3. Their current medications and supplements 4. The patient's functional ability including ADL's, fall risks, home safety risks and hearing or visual             impairment. 5. Diet and physical activities 6. Evidence for depression or mood disorders  The patients weight, height, BMI and visual acuity have been recorded in the chart I have made referrals, counseling and provided education to the patient based review of the above and I have provided the pt with a written personalized care plan for preventive services.  I have provided you with a copy of your personalized plan for preventive services. Please take the time to review along with your updated medication list.   Colonoscopy already scheduled mammo due next year

## 2013-04-27 NOTE — Addendum Note (Signed)
Addended by: Despina Hidden on: 04/27/2013 10:59 AM   Modules accepted: Orders

## 2013-04-27 NOTE — Assessment & Plan Note (Signed)
Propranolol is effective Has failed weaning attempts

## 2013-04-28 ENCOUNTER — Encounter: Payer: Self-pay | Admitting: *Deleted

## 2013-05-10 ENCOUNTER — Encounter: Payer: Self-pay | Admitting: Internal Medicine

## 2013-05-10 ENCOUNTER — Ambulatory Visit (AMBULATORY_SURGERY_CENTER): Payer: Medicare Other | Admitting: Internal Medicine

## 2013-05-10 VITALS — BP 115/76 | HR 60 | Temp 96.2°F | Resp 22 | Ht 62.0 in | Wt 122.0 lb

## 2013-05-10 DIAGNOSIS — Z1211 Encounter for screening for malignant neoplasm of colon: Secondary | ICD-10-CM

## 2013-05-10 DIAGNOSIS — K219 Gastro-esophageal reflux disease without esophagitis: Secondary | ICD-10-CM | POA: Diagnosis not present

## 2013-05-10 DIAGNOSIS — Z885 Allergy status to narcotic agent status: Secondary | ICD-10-CM | POA: Diagnosis not present

## 2013-05-10 DIAGNOSIS — F411 Generalized anxiety disorder: Secondary | ICD-10-CM | POA: Diagnosis not present

## 2013-05-10 DIAGNOSIS — K589 Irritable bowel syndrome without diarrhea: Secondary | ICD-10-CM | POA: Diagnosis not present

## 2013-05-10 DIAGNOSIS — I447 Left bundle-branch block, unspecified: Secondary | ICD-10-CM | POA: Diagnosis not present

## 2013-05-10 MED ORDER — SODIUM CHLORIDE 0.9 % IV SOLN: 500.0000 mL | INTRAVENOUS | Status: DC

## 2013-05-10 NOTE — Progress Notes (Signed)
Patient did not experience any of the following events: a burn prior to discharge; a fall within the facility; wrong site/side/patient/procedure/implant event; or a hospital transfer or hospital admission upon discharge from the facility. (G8907) Patient did not have preoperative order for IV antibiotic SSI prophylaxis. (G8918)  

## 2013-05-10 NOTE — Op Note (Signed)
[facility]  [address] [place], [address]   COLONOSCOPY PROCEDURE REPORT  PATIENT: Olivia, Flores  MR#: [id number] BIRTHDATE: 1940-12-22 , 72  yrs. old GENDER: Female ENDOSCOPIST: Lafayette Dragon, MD REFERRED BY:  Viviana Simpler, M.D. PROCEDURE DATE:  05/10/2013 PROCEDURE:   Colonoscopy, screening ASA CLASS:   Class II INDICATIONS:Average risk patient for colon cancer and last colonoscopy 2003 was normal. MEDICATIONS: MAC sedation, administered by CRNA and propofol (Diprivan) 199m IV  DESCRIPTION OF PROCEDURE:   After the risks and benefits and of the procedure were explained, informed consent was obtained.  A digital rectal exam revealed no abnormalities of the rectum.    The LB [id number] endoscope was introduced through the anus and advanced to the cecum, which was identified by both the appendix and ileocecal valve .  The quality of the prep was good, using MoviPrep .  The instrument was then slowly withdrawn as the colon was fully examined.     COLON FINDINGS: A normal appearing cecum, ileocecal valve, and appendiceal orifice were identified.  The ascending, hepatic flexure, transverse, splenic flexure, descending, sigmoid colon and rectum appeared unremarkable.  No polyps or cancers were seen. Retroflexed views revealed no abnormalities.     The scope was then withdrawn from the patient and the procedure completed.  COMPLICATIONS: There were no complications. ENDOSCOPIC IMPRESSION: Normal colon  RECOMMENDATIONS: High fiber diet   REPEAT EXAM: In 10 year(s)  for Colonoscopy.  cc:  _______________________________ eSigned:Lafayette Dragon MD 05/10/2013 3:03 PM

## 2013-05-10 NOTE — Progress Notes (Signed)
Lidocaine-40mg IV prior to Propofol InductionPropofol given over incremental dosages 

## 2013-05-10 NOTE — Patient Instructions (Addendum)
Discharge instructions given with verbal understanding. Normal exam. Resume previous medications. YOU HAD AN ENDOSCOPIC PROCEDURE TODAY AT THE [facility]: Refer to the procedure report that was given to you for any specific questions about what was found during the examination.  If the procedure report does not answer your questions, please call your gastroenterologist to clarify.  If you requested that your care partner not be given the details of your procedure findings, then the procedure report has been included in a sealed envelope for you to review at your convenience later.  YOU SHOULD EXPECT: Some feelings of bloating in the abdomen. Passage of more gas than usual.  Walking can help get rid of the air that was put into your GI tract during the procedure and reduce the bloating. If you had a lower endoscopy (such as a colonoscopy or flexible sigmoidoscopy) you may notice spotting of blood in your stool or on the toilet paper. If you underwent a bowel prep for your procedure, then you may not have a normal bowel movement for a few days.  DIET: Your first meal following the procedure should be a light meal and then it is ok to progress to your normal diet.  A half-sandwich or bowl of soup is an example of a good first meal.  Heavy or fried foods are harder to digest and may make you feel nauseous or bloated.  Likewise meals heavy in dairy and vegetables can cause extra gas to form and this can also increase the bloating.  Drink plenty of fluids but you should avoid alcoholic beverages for 24 hours.  ACTIVITY: Your care partner should take you home directly after the procedure.  You should plan to take it easy, moving slowly for the rest of the day.  You can resume normal activity the day after the procedure however you should NOT DRIVE or use heavy machinery for 24 hours (because of the sedation medicines used during the test).    SYMPTOMS TO REPORT IMMEDIATELY: A gastroenterologist  can be reached at any hour.  During normal business hours, 8:30 AM to 5:00 PM Monday through Friday, call [phone].  After hours and on weekends, please call the GI answering service at [phone] who will take a message and have the physician on call contact you.   Following lower endoscopy (colonoscopy or flexible sigmoidoscopy):  Excessive amounts of blood in the stool  Significant tenderness or worsening of abdominal pains  Swelling of the abdomen that is new, acute  Fever of 100F or higher  FOLLOW UP: If any biopsies were taken you will be contacted by phone or by letter within the next 1-3 weeks.  Call your gastroenterologist if you have not heard about the biopsies in 3 weeks.  Our staff will call the home number listed on your records the next business day following your procedure to check on you and address any questions or concerns that you may have at that time regarding the information given to you following your procedure. This is a courtesy call and so if there is no answer at the home number and we have not heard from you through the emergency physician on call, we will assume that you have returned to your regular daily activities without incident.  SIGNATURES/CONFIDENTIALITY: You and/or your care partner have signed paperwork which will be entered into your electronic medical record.  These signatures attest to the fact that that the information above on your After Visit Summary has been reviewed   and is understood.  Full responsibility of the confidentiality of this discharge information lies with you and/or your care-partner. 

## 2013-05-11 ENCOUNTER — Telehealth: Payer: Self-pay | Admitting: *Deleted

## 2013-05-11 NOTE — Telephone Encounter (Signed)
  Follow up Call-  Call back number 05/10/2013  Post procedure Call Back phone  # [phone]  Permission to leave phone message Yes     Patient questions:  Do you have a fever, pain , or abdominal swelling? no Pain Score  0 *  Have you tolerated food without any problems? yes  Have you been able to return to your normal activities? yes  Do you have any questions about your discharge instructions: Diet   no Medications  no Follow up visit  no  Do you have questions or concerns about your Care? no  Actions: * If pain score is 4 or above: No action needed, pain <4.

## 2013-07-04 ENCOUNTER — Encounter: Payer: Self-pay | Admitting: Obstetrics & Gynecology

## 2013-07-04 ENCOUNTER — Ambulatory Visit (INDEPENDENT_AMBULATORY_CARE_PROVIDER_SITE_OTHER): Payer: Medicare Other | Admitting: Obstetrics & Gynecology

## 2013-07-04 VITALS — BP 138/64 | HR 67 | Ht 63.0 in | Wt 125.0 lb

## 2013-07-04 DIAGNOSIS — L900 Lichen sclerosus et atrophicus: Secondary | ICD-10-CM

## 2013-07-04 DIAGNOSIS — L940 Localized scleroderma [morphea]: Secondary | ICD-10-CM | POA: Diagnosis not present

## 2013-07-04 NOTE — Progress Notes (Signed)
  Subjective:    Patient ID: [name], female    DOB: 1941-04-06, 72 y.o.   MRN: [id number]  HPI  72 yo lady with a diagnosis of lichen scleroses is here today for a refill of her meds (compounded estradiol and clobetasol on alternate nights). She has no complaints. 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 She is not currently sexually active.  Review of Systems    She is unsure when her last DEXA was done. She will check at home and call back to have it ordered if it was more than 3 years ago. Objective:   Physical Exam  No evidence of lichen scleroses currently, no evidence of vulvar ca Normal bimanual exam      Assessment & Plan:  Lichen- continue current meds. She has requested handwritten prescriptions and I have obliged her in this request. RTC 1 year/prn sooner

## 2013-08-19 ENCOUNTER — Encounter: Payer: Self-pay | Admitting: Obstetrics & Gynecology

## 2013-08-19 ENCOUNTER — Ambulatory Visit (INDEPENDENT_AMBULATORY_CARE_PROVIDER_SITE_OTHER): Payer: Medicare Other | Admitting: Obstetrics & Gynecology

## 2013-08-19 VITALS — BP 145/74 | HR 67 | Ht 63.0 in | Wt 122.0 lb

## 2013-08-19 DIAGNOSIS — L940 Localized scleroderma [morphea]: Secondary | ICD-10-CM | POA: Diagnosis not present

## 2013-08-19 DIAGNOSIS — L293 Anogenital pruritus, unspecified: Secondary | ICD-10-CM

## 2013-08-19 DIAGNOSIS — L900 Lichen sclerosus et atrophicus: Secondary | ICD-10-CM

## 2013-08-19 DIAGNOSIS — L292 Pruritus vulvae: Secondary | ICD-10-CM

## 2013-08-19 NOTE — Progress Notes (Signed)
  Subjective:    Patient ID: [name], female    DOB: 02/21/1941, 72 y.o.   MRN: [id number]  HPI  [name] is a 72 yo lady with known lichen sclerosis. It has been well controlled with alternate days of vaginal estrogen and clobetasol. She is here with a new vulvar itching/buring, especially on the left clitoral/labial area for about 2 weeks. The clobetasol and estrogen only provide very temporary help.  Review of Systems     Objective:   Physical Exam  Several blotchy red areas on the upper vulva, on the left. Looks like a contact dermatitis.      Assessment & Plan:  New vulvar rash. No help with clobetasol. I will refer her to Dr. Daniel Nones.

## 2013-10-10 ENCOUNTER — Telehealth: Payer: Self-pay | Admitting: *Deleted

## 2013-10-10 NOTE — Telephone Encounter (Signed)
Patient was diagnosed with Lichen Sclerosis and you had recommended that she see Daniel Nones in [place] because her medications were not helping much. (Clobetasol and estradiol creams).  She never heard from them and decided that she did not want to go that far to see someone.  She also says that at the time of her appointment with [place] she was not using the medications like she was supposed to.  She has started using the creams using one one day the the other the next going back and forth like this.  Her symptoms are significantly better now.  She wants to be sure that you are okay with her using the creams like this and to see if you needed to see her back any sooner than her usual one year appointment.  Just let me know if she needs to come in sooner or if its not ok to use the creams like this and I will let her know.

## 2013-12-13 ENCOUNTER — Other Ambulatory Visit: Payer: Self-pay | Admitting: Obstetrics & Gynecology

## 2013-12-13 DIAGNOSIS — Z1231 Encounter for screening mammogram for malignant neoplasm of breast: Secondary | ICD-10-CM

## 2013-12-22 ENCOUNTER — Ambulatory Visit (INDEPENDENT_AMBULATORY_CARE_PROVIDER_SITE_OTHER): Payer: Medicare Other | Admitting: Internal Medicine

## 2013-12-22 ENCOUNTER — Encounter: Payer: Self-pay | Admitting: Internal Medicine

## 2013-12-22 VITALS — BP 130/60 | HR 68 | Wt 127.0 lb

## 2013-12-22 DIAGNOSIS — R21 Rash and other nonspecific skin eruption: Secondary | ICD-10-CM | POA: Insufficient documentation

## 2013-12-22 DIAGNOSIS — L989 Disorder of the skin and subcutaneous tissue, unspecified: Secondary | ICD-10-CM | POA: Diagnosis not present

## 2013-12-22 NOTE — Patient Instructions (Signed)
Please set up an appointment with the [facility] dermatology group

## 2013-12-22 NOTE — Assessment & Plan Note (Signed)
Don't seem neoplastic Not responding to clobetasol She will set up with [facility] where her husband goes

## 2013-12-22 NOTE — Progress Notes (Signed)
Subjective:    Patient ID: [name], female    DOB: 16-Oct-1941, 73 y.o.   MRN: [id number]  HPI Having a problem for several months Noticed a "bite" inside left thigh in June Swollen and itchy Tried alcohol, peroxide, neosporin and clobetasol--nothing really helped Would seem to dry up and then worsen with redness  Last night got worse Showed picture of slightly raised pink lesion with central dot Flattened out some today  Had red spot on back This did clear with the clobetasol  Reviewed note with Dr Hulan Fray Had some more itching and soreness near urethra--starting in July Had blister and then surrounding rash She didn't think it was lichen sclerosis---referred to dermatologist (but couldn't get there--now in [place]) Still using estrogen cream 2-3 times per week Then went to alternating estrogen and clobetasol since September-- bad if she misses a night Gets irritated  Current Outpatient Prescriptions on File Prior to Visit  Medication Sig Dispense Refill  . calcium-vitamin D (OSCAL WITH D 250-125) 250-125 MG-UNIT per tablet Take 1 tablet by mouth daily.        . clobetasol cream (TEMOVATE) 0.05 % Apply topically 2 (two) times daily.      . clorazepate (TRANXENE) 7.5 MG tablet Take 1 tablet (7.5 mg total) by mouth 2 (two) times daily as needed.  60 tablet  1  . docusate sodium (COLACE) 100 MG capsule Take 100 mg by mouth as directed.        Marland Kitchen estradiol (ESTRACE) 0.1 MG/GM vaginal cream Place 2 g vaginally every other day.        . fish oil-omega-3 fatty acids 1000 MG capsule Take 2 g by mouth daily.        . Multiple Vitamin (MULTIVITAMIN) tablet Take 1 tablet by mouth daily.        . pravastatin (PRAVACHOL) 20 MG tablet Take 1 tablet (20 mg total) by mouth daily.  90 tablet  3  . propranolol (INDERAL) 40 MG tablet Take 1 tablet (40 mg total) by mouth 2 (two) times daily.  180 tablet  3  . vitamin B-12 (CYANOCOBALAMIN) 1000 MCG tablet Take 1,000 mcg by mouth daily.        No current facility-administered medications on file prior to visit.    Allergies  Allergen Reactions  . Clonazepam     REACTION: Swollen lips, sore mouth  . Penicillins     REACTION: unrecalled reaction    Past Medical History  Diagnosis Date  . Anxiety   . GERD (gastroesophageal reflux disease)     Dr. [name]  . HLD (hyperlipidemia)     Dr. Lia Foyer  . Osteopenia 2005  . OA (osteoarthritis)   . IBS (irritable bowel syndrome)   . Migraine   . Ulcer 2003    bleeding stomach ulcer  . LBBB (left bundle branch block)     Past Surgical History  Procedure Laterality Date  . Cardiac catheterization  1997    okay, mild changes  . Tonsillectomy and adenoidectomy  1953  . Cholecystectomy  1992  . Abdominal hysterectomy  1983  . Appendectomy  1983  . Dexa N/A 2005    borderline osteopenia  . Colonoscopy  2003  . Upper gastrointestinal endoscopy  2003    Family History  Problem Relation Age of Onset  . Heart attack Father 82  . Diabetes Father   . Coronary artery disease Father   . COPD Mother   . Hypertension Mother   . Pulmonary  fibrosis Brother 60  . Hypertension Sister   . Cancer Daughter     breast  . Rheum arthritis Daughter   . Colon cancer Neg Hx     History   Social History  . Marital Status: Married    Spouse Name: N/A    Number of Children: N/A  . Years of Education: N/A   Occupational History  . Retired Database administrator asst.    Social History Main Topics  . Smoking status: Never Smoker   . Smokeless tobacco: Never Used  . Alcohol Use: No  . Drug Use: No  . Sexual Activity: Not on file   Other Topics Concern  . Not on file   Social History Narrative   Has living will   Husband, then daughters, are [place] health care POA   Would accept resuscitation attempts but no prolonged artifical ventilation   No prolonged tube feeds if cognitively impaired   Review of Systems No fever Hasn't been sick     Objective:   Physical  Exam  Constitutional: She appears well-developed and well-nourished. No distress.  Skin:  2 small red nodules on either side of clitoris No real rash---some atrophic tissue though  Small red nodule on medial mid right thigh--not infected  Small seb keratosis on lower right back          Assessment & Plan:

## 2013-12-23 ENCOUNTER — Ambulatory Visit (HOSPITAL_COMMUNITY)
Admission: RE | Admit: 2013-12-23 | Discharge: 2013-12-23 | Disposition: A | Discharge status: Home / Self Care | Payer: Medicare Other | Source: Ambulatory Visit | Attending: Obstetrics & Gynecology | Admitting: Obstetrics & Gynecology

## 2013-12-23 DIAGNOSIS — Z1231 Encounter for screening mammogram for malignant neoplasm of breast: Secondary | ICD-10-CM | POA: Diagnosis not present

## 2014-01-19 DIAGNOSIS — L03039 Cellulitis of unspecified toe: Secondary | ICD-10-CM | POA: Diagnosis not present

## 2014-01-19 DIAGNOSIS — M79609 Pain in unspecified limb: Secondary | ICD-10-CM | POA: Diagnosis not present

## 2014-01-31 DIAGNOSIS — R21 Rash and other nonspecific skin eruption: Secondary | ICD-10-CM | POA: Diagnosis not present

## 2014-01-31 DIAGNOSIS — L820 Inflamed seborrheic keratosis: Secondary | ICD-10-CM | POA: Diagnosis not present

## 2014-01-31 DIAGNOSIS — L940 Localized scleroderma [morphea]: Secondary | ICD-10-CM | POA: Diagnosis not present

## 2014-02-07 DIAGNOSIS — L03039 Cellulitis of unspecified toe: Secondary | ICD-10-CM | POA: Diagnosis not present

## 2014-02-28 DIAGNOSIS — R21 Rash and other nonspecific skin eruption: Secondary | ICD-10-CM | POA: Diagnosis not present

## 2014-02-28 DIAGNOSIS — L940 Localized scleroderma [morphea]: Secondary | ICD-10-CM | POA: Diagnosis not present

## 2014-02-28 DIAGNOSIS — L821 Other seborrheic keratosis: Secondary | ICD-10-CM | POA: Diagnosis not present

## 2014-02-28 DIAGNOSIS — L820 Inflamed seborrheic keratosis: Secondary | ICD-10-CM | POA: Diagnosis not present

## 2014-04-12 ENCOUNTER — Telehealth: Payer: Self-pay | Admitting: *Deleted

## 2014-04-12 NOTE — Telephone Encounter (Signed)
Patient is notified of this and wants to hold of a little longer and come back to see you in September or October as this will be a full year and be better for her insurance.  She has seen a dermatologist as well though and wanted you to know that she is feeling much better with her irritation.

## 2014-04-12 NOTE — Telephone Encounter (Signed)
Message copied by Erik Obey on Wed Apr 12, 2014  4:00 PM ------      Message from: Clovia Cuff C      Created: Thu Mar 02, 2014  2:00 PM       Let's get her in in the next 3 months just so l am sure that she is getting better.      Thanks ------

## 2014-04-20 DIAGNOSIS — H2510 Age-related nuclear cataract, unspecified eye: Secondary | ICD-10-CM | POA: Diagnosis not present

## 2014-05-02 ENCOUNTER — Ambulatory Visit (INDEPENDENT_AMBULATORY_CARE_PROVIDER_SITE_OTHER): Payer: Medicare Other | Admitting: Internal Medicine

## 2014-05-02 ENCOUNTER — Encounter: Payer: Self-pay | Admitting: Internal Medicine

## 2014-05-02 ENCOUNTER — Encounter: Payer: Self-pay | Admitting: *Deleted

## 2014-05-02 VITALS — BP 110/60 | HR 62 | Temp 98.3°F | Wt 125.0 lb

## 2014-05-02 DIAGNOSIS — E785 Hyperlipidemia, unspecified: Secondary | ICD-10-CM | POA: Diagnosis not present

## 2014-05-02 DIAGNOSIS — Z79899 Other long term (current) drug therapy: Secondary | ICD-10-CM | POA: Diagnosis not present

## 2014-05-02 DIAGNOSIS — G43109 Migraine with aura, not intractable, without status migrainosus: Secondary | ICD-10-CM | POA: Diagnosis not present

## 2014-05-02 DIAGNOSIS — E538 Deficiency of other specified B group vitamins: Secondary | ICD-10-CM | POA: Diagnosis not present

## 2014-05-02 DIAGNOSIS — K589 Irritable bowel syndrome without diarrhea: Secondary | ICD-10-CM

## 2014-05-02 LAB — CBC WITH DIFFERENTIAL/PLATELET
BASOS ABS: 0 10*3/uL (ref 0.0–0.1)
Basophils Relative: 0.5 % (ref 0.0–3.0)
Eosinophils Absolute: 0.2 10*3/uL (ref 0.0–0.7)
Eosinophils Relative: 3.4 % (ref 0.0–5.0)
HCT: 39.4 % (ref 36.0–46.0)
Hemoglobin: 13.2 g/dL (ref 12.0–15.0)
LYMPHS PCT: 39.2 % (ref 12.0–46.0)
Lymphs Abs: 2.5 10*3/uL (ref 0.7–4.0)
MCHC: 33.4 g/dL (ref 30.0–36.0)
MCV: 89.8 fl (ref 78.0–100.0)
MONOS PCT: 7.3 % (ref 3.0–12.0)
Monocytes Absolute: 0.5 10*3/uL (ref 0.1–1.0)
Neutro Abs: 3.2 10*3/uL (ref 1.4–7.7)
Neutrophils Relative %: 49.6 % (ref 43.0–77.0)
PLATELETS: 278 10*3/uL (ref 150.0–400.0)
RBC: 4.39 Mil/uL (ref 3.87–5.11)
RDW: 13.4 % (ref 11.5–15.5)
WBC: 6.4 10*3/uL (ref 4.0–10.5)

## 2014-05-02 LAB — COMPREHENSIVE METABOLIC PANEL
ALT: 15 U/L (ref 0–35)
AST: 16 U/L (ref 0–37)
Albumin: 3.2 g/dL — ABNORMAL LOW (ref 3.5–5.2)
Alkaline Phosphatase: 48 U/L (ref 39–117)
BUN: 11 mg/dL (ref 6–23)
CALCIUM: 8.9 mg/dL (ref 8.4–10.5)
CHLORIDE: 103 meq/L (ref 96–112)
CO2: 27 meq/L (ref 19–32)
CREATININE: 0.8 mg/dL (ref 0.4–1.2)
GFR: 70.81 mL/min (ref >60.00)
GLUCOSE: 90 mg/dL (ref 70–99)
Potassium: 4.7 mEq/L (ref 3.5–5.1)
SODIUM: 139 meq/L (ref 135–145)
TOTAL PROTEIN: 5.8 g/dL — AB (ref 6.0–8.3)
Total Bilirubin: 0.3 mg/dL (ref 0.2–1.2)

## 2014-05-02 LAB — LIPID PANEL
CHOLESTEROL: 173 mg/dL (ref 0–200)
HDL: 45.6 mg/dL (ref >39.00)
LDL Cholesterol: 100 mg/dL — ABNORMAL HIGH (ref 0–99)
TRIGLYCERIDES: 135 mg/dL (ref 0.0–149.0)
Total CHOL/HDL Ratio: 4
VLDL: 27 mg/dL (ref 0.0–40.0)

## 2014-05-02 LAB — TSH: TSH: 0.49 u[IU]/mL (ref 0.35–4.50)

## 2014-05-02 LAB — VITAMIN B12: Vitamin B-12: 724 pg/mL (ref 211–911)

## 2014-05-02 LAB — T4, FREE: FREE T4: 1.16 ng/dL (ref 0.60–1.60)

## 2014-05-02 MED ORDER — PROPRANOLOL HCL 40 MG PO TABS: 40.0000 mg | ORAL_TABLET | Freq: Two times a day (BID) | ORAL | Status: DC

## 2014-05-02 MED ORDER — PRAVASTATIN SODIUM 20 MG PO TABS: 20.0000 mg | ORAL_TABLET | Freq: Every day | ORAL | Status: DC

## 2014-05-02 MED ORDER — CLORAZEPATE DIPOTASSIUM 7.5 MG PO TABS: 7.5000 mg | ORAL_TABLET | Freq: Two times a day (BID) | ORAL | Status: DC | PRN

## 2014-05-02 NOTE — Progress Notes (Signed)
Pre visit review using our clinic review tool, if applicable. No additional management support is needed unless otherwise documented below in the visit note. 

## 2014-05-02 NOTE — Progress Notes (Signed)
Subjective:    Patient ID: [name], female    DOB: 09-Jun-1941, 73 y.o.   MRN: [id number]  HPI Here for follow up Doing well Occasional down days ---but never persistent. Not anhedonic No falls UTD with colon and mammogram  Doing well on the statin No myalgias  Regular at the gym Works part time--- 2-3 days per week (for brief time)  Uses the clorazepate for her IBS May go a week or more without needing the med Rarely uses the narcotic  Has tried to wean the propranolol-- once a day several times per week Still gets occasional migraine--but easy to abort (tylenol will help)  Current Outpatient Prescriptions on File Prior to Visit  Medication Sig Dispense Refill  . calcium-vitamin D (OSCAL WITH D 250-125) 250-125 MG-UNIT per tablet Take 1 tablet by mouth daily.        . clobetasol cream (TEMOVATE) 0.05 % Apply topically 2 (two) times daily.      . clorazepate (TRANXENE) 7.5 MG tablet Take 1 tablet (7.5 mg total) by mouth 2 (two) times daily as needed.  60 tablet  1  . docusate sodium (COLACE) 100 MG capsule Take 100 mg by mouth as directed.        Marland Kitchen estradiol (ESTRACE) 0.1 MG/GM vaginal cream Place 2 g vaginally every other day.        . fish oil-omega-3 fatty acids 1000 MG capsule Take 2 g by mouth daily.        . Multiple Vitamin (MULTIVITAMIN) tablet Take 1 tablet by mouth daily.        . pravastatin (PRAVACHOL) 20 MG tablet Take 1 tablet (20 mg total) by mouth daily.  90 tablet  3  . propranolol (INDERAL) 40 MG tablet Take 1 tablet (40 mg total) by mouth 2 (two) times daily.  180 tablet  3  . vitamin B-12 (CYANOCOBALAMIN) 1000 MCG tablet Take 1,000 mcg by mouth daily.       No current facility-administered medications on file prior to visit.    Allergies  Allergen Reactions  . Clonazepam     REACTION: Swollen lips, sore mouth  . Penicillins     REACTION: unrecalled reaction    Past Medical History  Diagnosis Date  . Anxiety   . GERD (gastroesophageal  reflux disease)     Dr. [name]  . HLD (hyperlipidemia)     Dr. Lia Foyer  . Osteopenia 2005  . OA (osteoarthritis)   . IBS (irritable bowel syndrome)   . Migraine   . Ulcer 2003    bleeding stomach ulcer  . LBBB (left bundle branch block)     Past Surgical History  Procedure Laterality Date  . Cardiac catheterization  1997    okay, mild changes  . Tonsillectomy and adenoidectomy  1953  . Cholecystectomy  1992  . Abdominal hysterectomy  1983  . Appendectomy  1983  . Dexa N/A 2005    borderline osteopenia  . Colonoscopy  2003  . Upper gastrointestinal endoscopy  2003    Family History  Problem Relation Age of Onset  . Heart attack Father 20  . Diabetes Father   . Coronary artery disease Father   . COPD Mother   . Hypertension Mother   . Pulmonary fibrosis Brother 51  . Hypertension Sister   . Cancer Daughter     breast  . Rheum arthritis Daughter   . Colon cancer Neg Hx     History   Social History  .  Marital Status: Married    Spouse Name: N/A    Number of Children: N/A  . Years of Education: N/A   Occupational History  . Retired Database administrator asst.    Social History Main Topics  . Smoking status: Never Smoker   . Smokeless tobacco: Never Used  . Alcohol Use: No  . Drug Use: No  . Sexual Activity: Not on file   Other Topics Concern  . Not on file   Social History Narrative   Has living will   Husband, then daughters, are [facility] health care POA   Would accept resuscitation attempts but no prolonged artifical ventilation   No prolonged tube feeds if cognitively impaired   Review of Systems Sleep is still not great---sleeps 3 hours, then dozes Will rarely get a racing heart--better with a few deep breaths. May have visual change also Recent eye exam was fine Appetite is good Weight is stable    Objective:   Physical Exam  Constitutional: She appears well-developed and well-nourished. No distress.  Neck: Normal range of motion. Neck  supple. No thyromegaly present.  Cardiovascular: Normal rate, regular rhythm, normal heart sounds and intact distal pulses.  Exam reveals no gallop.   No murmur heard. Pulmonary/Chest: Effort normal and breath sounds normal. No respiratory distress. She has no wheezes. She has no rales.  Abdominal: Soft. There is no tenderness.  Musculoskeletal: She exhibits no tenderness.  Trace right ankle edema  Lymphadenopathy:    She has no cervical adenopathy.  Psychiatric: She has a normal mood and affect. Her behavior is normal.          Assessment & Plan:

## 2014-05-02 NOTE — Assessment & Plan Note (Signed)
Doing okay with primary prevention

## 2014-05-02 NOTE — Assessment & Plan Note (Signed)
Uses the clorazepate prn Helps her sleep also

## 2014-05-02 NOTE — Assessment & Plan Note (Signed)
Reasonable control on propranolol Hasn't been able to wean much

## 2014-05-09 ENCOUNTER — Encounter: Payer: Self-pay | Admitting: *Deleted

## 2014-05-22 ENCOUNTER — Encounter: Payer: Self-pay | Admitting: Internal Medicine

## 2014-06-27 DIAGNOSIS — L820 Inflamed seborrheic keratosis: Secondary | ICD-10-CM | POA: Diagnosis not present

## 2014-06-27 DIAGNOSIS — L940 Localized scleroderma [morphea]: Secondary | ICD-10-CM | POA: Diagnosis not present

## 2014-06-27 DIAGNOSIS — R21 Rash and other nonspecific skin eruption: Secondary | ICD-10-CM | POA: Diagnosis not present

## 2015-01-01 DIAGNOSIS — R21 Rash and other nonspecific skin eruption: Secondary | ICD-10-CM | POA: Diagnosis not present

## 2015-01-01 DIAGNOSIS — L900 Lichen sclerosus et atrophicus: Secondary | ICD-10-CM | POA: Diagnosis not present

## 2015-02-07 ENCOUNTER — Emergency Department: Payer: Self-pay | Admitting: Emergency Medicine

## 2015-02-07 DIAGNOSIS — R109 Unspecified abdominal pain: Secondary | ICD-10-CM | POA: Diagnosis not present

## 2015-02-07 DIAGNOSIS — R1084 Generalized abdominal pain: Secondary | ICD-10-CM | POA: Diagnosis not present

## 2015-02-07 DIAGNOSIS — R531 Weakness: Secondary | ICD-10-CM | POA: Diagnosis not present

## 2015-02-07 DIAGNOSIS — R1111 Vomiting without nausea: Secondary | ICD-10-CM | POA: Diagnosis not present

## 2015-02-07 DIAGNOSIS — R404 Transient alteration of awareness: Secondary | ICD-10-CM | POA: Diagnosis not present

## 2015-02-07 DIAGNOSIS — R1110 Vomiting, unspecified: Secondary | ICD-10-CM | POA: Diagnosis not present

## 2015-02-07 DIAGNOSIS — R197 Diarrhea, unspecified: Secondary | ICD-10-CM | POA: Diagnosis not present

## 2015-02-07 DIAGNOSIS — Z8719 Personal history of other diseases of the digestive system: Secondary | ICD-10-CM | POA: Diagnosis not present

## 2015-02-20 ENCOUNTER — Encounter: Payer: Self-pay | Admitting: Obstetrics & Gynecology

## 2015-02-20 ENCOUNTER — Ambulatory Visit (INDEPENDENT_AMBULATORY_CARE_PROVIDER_SITE_OTHER): Payer: Medicare Other | Admitting: Obstetrics & Gynecology

## 2015-02-20 VITALS — BP 141/61 | HR 69 | Ht 63.0 in | Wt 124.6 lb

## 2015-02-20 DIAGNOSIS — Z0000 Encounter for general adult medical examination without abnormal findings: Secondary | ICD-10-CM

## 2015-02-20 DIAGNOSIS — L900 Lichen sclerosus et atrophicus: Secondary | ICD-10-CM

## 2015-02-20 DIAGNOSIS — N952 Postmenopausal atrophic vaginitis: Secondary | ICD-10-CM

## 2015-02-20 NOTE — Progress Notes (Signed)
   Subjective:    Patient ID: [name], female    DOB: Apr 22, 1941, 74 y.o.   MRN: [id number]  HPI This lovely 74 yo lady is here for a bimanual exam. She was diagnosed with lichen sclerosis by a dermatologist at [facility] and uses clobetasol QOD for that. She quit using her vaginal estrogen but doesn't feel like she needs it now. She is not sexually active. She plans to get her mammogram in the near future.   Review of Systems     Objective:   Physical Exam [id number] NAD Breathing and ambulating normally  EG- severe atrophy, minimal signs of lichen sclerosus now, no evidence of cancer Vagina and cuff atrophic Bimanual reveals no masses       Assessment & Plan:  Preventative care- rec mammogram  RTC prn

## 2015-02-26 ENCOUNTER — Encounter: Payer: Self-pay | Admitting: Internal Medicine

## 2015-02-26 ENCOUNTER — Telehealth: Payer: Self-pay | Admitting: Internal Medicine

## 2015-02-26 ENCOUNTER — Ambulatory Visit (INDEPENDENT_AMBULATORY_CARE_PROVIDER_SITE_OTHER): Payer: Medicare Other | Admitting: Internal Medicine

## 2015-02-26 VITALS — BP 120/70 | HR 71 | Temp 98.2°F | Wt 124.0 lb

## 2015-02-26 DIAGNOSIS — B029 Zoster without complications: Secondary | ICD-10-CM | POA: Diagnosis not present

## 2015-02-26 DIAGNOSIS — B019 Varicella without complication: Secondary | ICD-10-CM

## 2015-02-26 MED ORDER — VALACYCLOVIR HCL 1 G PO TABS: 1000.0000 mg | ORAL_TABLET | Freq: Two times a day (BID) | ORAL | Status: DC

## 2015-02-26 NOTE — Telephone Encounter (Signed)
See if she would like to come in at 12:30 today instead

## 2015-02-26 NOTE — Telephone Encounter (Signed)
[facility] Patient Name: [name] DOB: 1941-06-24 Initial Comment Caller states she began having rib soreness and red splotches last Thursday Nurse Assessment Nurse: Donalynn Furlong, RN, Myna Hidalgo Date/Time Eilene Ghazi Time): 02/26/2015 9:16:31 AM Confirm and document reason for call. If symptomatic, describe symptoms. ---Caller states she began having rib soreness and red splotches last Thursday. The reddened areas are described as under the pendulous part of the left breast , which she has been using steroid cream on that belongs to her husband. She had an episode of irritable bowel last week, which also included "several episodes of retching", which the pt feels may have propagated her rib pain today Has the patient traveled out of the country within the last 30 days? ---No Does the patient require triage? ---Yes Related visit to physician within the last 2 weeks? ---No Does the PT have any chronic conditions? (i.e. diabetes, asthma, etc.) ---Yes List chronic conditions. ---irritable bowel syndrome, migraines, high cholesterol Guidelines Guideline Title Affirmed Question Affirmed Notes Chest Injury [1] After 72 hours AND [2] chest pain not improving Final Disposition User See PCP When Office is Open (within 3 days) Donalynn Furlong, RN, Myna Hidalgo Comments Advised pt to try an OTC cream to fight potential yeast that may be under the pendulous part of her breast for relief, caller states she will try this. Called pt with appt scheduled Wed 3/23 at 12:15 with Dr Silvio Pate

## 2015-02-26 NOTE — Assessment & Plan Note (Signed)
Fairly classic presentation but fortunately seems to be attentuated due to vaccination Will still give antiviral Hopefully wont need any other analgesics

## 2015-02-26 NOTE — Progress Notes (Signed)
Pre visit review using our clinic review tool, if applicable. No additional management support is needed unless otherwise documented below in the visit note. 

## 2015-02-26 NOTE — Telephone Encounter (Signed)
Pt has appt with Dr Silvio Pate on 02/28/15 at 12:15.

## 2015-02-26 NOTE — Telephone Encounter (Signed)
appt scheduled

## 2015-02-26 NOTE — Progress Notes (Signed)
Subjective:    Patient ID: [name], female    DOB: 06/18/1941, 74 y.o.   MRN: [id number]  HPI Here due to rash Noticed ~4 days ago--- some left flank pain, shooting type and sharp at the end Soreness to touch also there Tried tylenol and ibuprofen---not sure if it helped at all  Noticed some splotches under the left breast yesterday Also saw along the flank at the same level--- and in back  No fever  Current Outpatient Prescriptions on File Prior to Visit  Medication Sig Dispense Refill  . calcium-vitamin D (OSCAL WITH D 250-125) 250-125 MG-UNIT per tablet Take 1 tablet by mouth daily.      . clobetasol cream (TEMOVATE) 0.05 % Apply topically 2 (two) times daily.    . clorazepate (TRANXENE) 7.5 MG tablet Take 1 tablet (7.5 mg total) by mouth 2 (two) times daily as needed. 60 tablet 1  . dicyclomine (BENTYL) 20 MG tablet     . docusate sodium (COLACE) 100 MG capsule Take 100 mg by mouth as directed.      Marland Kitchen estradiol (ESTRACE) 0.1 MG/GM vaginal cream Place 2 g vaginally every other day.      . fish oil-omega-3 fatty acids 1000 MG capsule Take 2 g by mouth daily.      . Multiple Vitamin (MULTIVITAMIN) tablet Take 1 tablet by mouth daily.      . ondansetron (ZOFRAN) 4 MG tablet     . pravastatin (PRAVACHOL) 20 MG tablet Take 1 tablet (20 mg total) by mouth daily. 90 tablet 3  . propranolol (INDERAL) 40 MG tablet Take 1 tablet (40 mg total) by mouth 2 (two) times daily. 180 tablet 3  . vitamin B-12 (CYANOCOBALAMIN) 1000 MCG tablet Take 1,000 mcg by mouth daily.     No current facility-administered medications on file prior to visit.    Allergies  Allergen Reactions  . Clonazepam     REACTION: Swollen lips, sore mouth  . Penicillins     REACTION: unrecalled reaction    Past Medical History  Diagnosis Date  . Anxiety   . GERD (gastroesophageal reflux disease)     Dr. [name]  . HLD (hyperlipidemia)     Dr. Lia Foyer  . Osteopenia 2005  . OA (osteoarthritis)   . IBS  (irritable bowel syndrome)   . Migraine   . Ulcer 2003    bleeding stomach ulcer  . LBBB (left bundle branch block)     Past Surgical History  Procedure Laterality Date  . Cardiac catheterization  1997    okay, mild changes  . Tonsillectomy and adenoidectomy  1953  . Cholecystectomy  1992  . Abdominal hysterectomy  1983  . Appendectomy  1983  . Dexa N/A 2005    borderline osteopenia  . Colonoscopy  2003  . Upper gastrointestinal endoscopy  2003    Family History  Problem Relation Age of Onset  . Heart attack Father 29  . Diabetes Father   . Coronary artery disease Father   . COPD Mother   . Hypertension Mother   . Pulmonary fibrosis Brother 4  . Hypertension Sister   . Cancer Daughter     breast  . Rheum arthritis Daughter   . Colon cancer Neg Hx     History   Social History  . Marital Status: Married    Spouse Name: N/A  . Number of Children: N/A  . Years of Education: N/A   Occupational History  . Retired Museum/gallery exhibitions officer part-time  library asst.    Social History Main Topics  . Smoking status: Never Smoker   . Smokeless tobacco: Never Used  . Alcohol Use: No  . Drug Use: No  . Sexual Activity: Not on file   Other Topics Concern  . Not on file   Social History Narrative   Has living will   Husband, then daughters, are [place] health care POA   Would accept resuscitation attempts but no prolonged artifical ventilation   No prolonged tube feeds if cognitively impaired   Review of Systems  No fever Bad spell of vomiting and diarrhea 3 weeks ago---had to go to ER for fluids. This has totally resolved     Objective:   Physical Exam  Skin:  Red macules along left side--under breast and around back along same dermatome (~T8)          Assessment & Plan:

## 2015-02-28 ENCOUNTER — Ambulatory Visit: Payer: Self-pay | Admitting: Internal Medicine

## 2015-03-28 ENCOUNTER — Other Ambulatory Visit: Payer: Self-pay | Admitting: Internal Medicine

## 2015-05-04 ENCOUNTER — Encounter: Payer: Medicare Other | Admitting: Internal Medicine

## 2015-05-09 ENCOUNTER — Encounter: Payer: Medicare Other | Admitting: Internal Medicine

## 2015-06-04 ENCOUNTER — Ambulatory Visit (INDEPENDENT_AMBULATORY_CARE_PROVIDER_SITE_OTHER): Payer: Medicare Other | Admitting: Internal Medicine

## 2015-06-04 ENCOUNTER — Encounter: Payer: Self-pay | Admitting: Internal Medicine

## 2015-06-04 VITALS — BP 110/60 | HR 71 | Temp 98.0°F | Wt 120.0 lb

## 2015-06-04 DIAGNOSIS — G43109 Migraine with aura, not intractable, without status migrainosus: Secondary | ICD-10-CM

## 2015-06-04 DIAGNOSIS — Z0000 Encounter for general adult medical examination without abnormal findings: Secondary | ICD-10-CM | POA: Diagnosis not present

## 2015-06-04 DIAGNOSIS — E538 Deficiency of other specified B group vitamins: Secondary | ICD-10-CM | POA: Diagnosis not present

## 2015-06-04 DIAGNOSIS — Z23 Encounter for immunization: Secondary | ICD-10-CM | POA: Diagnosis not present

## 2015-06-04 DIAGNOSIS — K589 Irritable bowel syndrome without diarrhea: Secondary | ICD-10-CM

## 2015-06-04 DIAGNOSIS — F39 Unspecified mood [affective] disorder: Secondary | ICD-10-CM

## 2015-06-04 DIAGNOSIS — E785 Hyperlipidemia, unspecified: Secondary | ICD-10-CM

## 2015-06-04 MED ORDER — PROPRANOLOL HCL 40 MG PO TABS
40.0000 mg | ORAL_TABLET | Freq: Two times a day (BID) | ORAL | Status: DC
Start: 2015-06-04 — End: 2016-06-05

## 2015-06-04 MED ORDER — PRAVASTATIN SODIUM 20 MG PO TABS: 20.0000 mg | ORAL_TABLET | Freq: Every day | ORAL | Status: DC

## 2015-06-04 MED ORDER — OXYCODONE-ACETAMINOPHEN 5-325 MG PO TABS: 1.0000 | ORAL_TABLET | Freq: Four times a day (QID) | ORAL | Status: DC | PRN

## 2015-06-04 MED ORDER — CLORAZEPATE DIPOTASSIUM 7.5 MG PO TABS: 7.5000 mg | ORAL_TABLET | Freq: Two times a day (BID) | ORAL | Status: DC | PRN

## 2015-06-04 NOTE — Assessment & Plan Note (Signed)
Mostly anxiety with GI symptoms Occasional depressed mood --but never lasts long Discussed stopping the PM sleep meds---could be causing her tired feelings

## 2015-06-04 NOTE — Assessment & Plan Note (Signed)
Still okay with primary prevention with statin

## 2015-06-04 NOTE — Progress Notes (Signed)
Subjective:    Patient ID: [name], female    DOB: Jan 23, 1941, 74 y.o.   MRN: [id number]  HPI Here for Medicare wellness and follow up of chronic medical conditions Reviewed form and advanced directives Reviewed other doctors No alcohol or tobacco Does exercise regularly Has noticed some forgetfulness  --no apraxia Vision is fine Poor hearing--aides not very helpful Independent with instrumental ADLs No falls  Did get over shingles Recent exam with Dr Cletus Gash needing the estrogen cream --just the clobetasol Follows with Dr Nicole Kindred for the lichen sclerosis  Feels like her energy is not good Since ER visit and then shingles Legs seem weak Discussed stopping the PM meds  Ongoing problems with the IBS Lots of gas and rumbling This gets her anxious Mostly uses the clorazepate to settle stomach at night Will occasionally get very painful attacks--has rarely used the hydrocodone but needs some more Mild depressed mood but not persistent No anhedonia Fluctuates between constipation and diarrhea  No problems with statin No apparent myalgias No difference in GI symptoms on this  Still gets sensation in forehead Continues on the propranolol to prevent headaches Relates this to the Bell's palsy 4 years ago  Current Outpatient Prescriptions on File Prior to Visit  Medication Sig Dispense Refill  . calcium-vitamin D (OSCAL WITH D 250-125) 250-125 MG-UNIT per tablet Take 1 tablet by mouth daily.      . clobetasol cream (TEMOVATE) 0.05 % Apply topically 2 (two) times daily.    . clorazepate (TRANXENE) 7.5 MG tablet Take 1 tablet (7.5 mg total) by mouth 2 (two) times daily as needed. 60 tablet 1  . dicyclomine (BENTYL) 20 MG tablet     . docusate sodium (COLACE) 100 MG capsule Take 100 mg by mouth as directed.      . fish oil-omega-3 fatty acids 1000 MG capsule Take 2 g by mouth daily.      . Multiple Vitamin (MULTIVITAMIN) tablet Take 1 tablet by mouth daily.      .  ondansetron (ZOFRAN) 4 MG tablet     . vitamin B-12 (CYANOCOBALAMIN) 1000 MCG tablet Take 1,000 mcg by mouth daily.     No current facility-administered medications on file prior to visit.    Allergies  Allergen Reactions  . Clonazepam     REACTION: Swollen lips, sore mouth  . Penicillins     REACTION: unrecalled reaction    Past Medical History  Diagnosis Date  . Anxiety   . GERD (gastroesophageal reflux disease)     Dr. [name]  . HLD (hyperlipidemia)     Dr. Lia Foyer  . Osteopenia 2005  . OA (osteoarthritis)   . IBS (irritable bowel syndrome)   . Migraine   . Ulcer 2003    bleeding stomach ulcer  . LBBB (left bundle branch block)     Past Surgical History  Procedure Laterality Date  . Cardiac catheterization  1997    okay, mild changes  . Tonsillectomy and adenoidectomy  1953  . Cholecystectomy  1992  . Abdominal hysterectomy  1983  . Appendectomy  1983  . Dexa N/A 2005    borderline osteopenia  . Colonoscopy  2003  . Upper gastrointestinal endoscopy  2003    Family History  Problem Relation Age of Onset  . Heart attack Father 42  . Diabetes Father   . Coronary artery disease Father   . COPD Mother   . Hypertension Mother   . Pulmonary fibrosis Brother 18  . Hypertension  Sister   . Cancer Daughter     breast  . Rheum arthritis Daughter   . Colon cancer Neg Hx     History   Social History  . Marital Status: Married    Spouse Name: N/A  . Number of Children: N/A  . Years of Education: N/A   Occupational History  . Retired Database administrator asst.    Social History Main Topics  . Smoking status: Never Smoker   . Smokeless tobacco: Never Used  . Alcohol Use: No  . Drug Use: No  . Sexual Activity: Not on file   Other Topics Concern  . Not on file   Social History Narrative   Has living will   Husband, then daughters, are [place] health care POA   Would accept resuscitation attempts but no prolonged artifical ventilation   No  prolonged tube feeds if cognitively impaired   Review of Systems Appetite is fair--less than in the past Weight is down a few pounds Sleep is variable--not great. Uses OTC sleep aide Teeth okay-- keeps up with dentist (dental school at [facility]) No chest pain No palpitations---or very rare No SOB No suspicious skin lesions now--goes to derm and keeps things frozen     Objective:   Physical Exam  Constitutional: She is oriented to person, place, and time. She appears well-developed and well-nourished. No distress.  HENT:  Mouth/Throat: Oropharynx is clear and moist. No oropharyngeal exudate.  Neck: Normal range of motion. Neck supple. No thyromegaly present.  Cardiovascular: Normal rate, regular rhythm, normal heart sounds and intact distal pulses.  Exam reveals no gallop.   No murmur heard. Pulmonary/Chest: Effort normal and breath sounds normal. No respiratory distress. She has no wheezes. She has no rales.  Abdominal: Soft. There is no tenderness.  Musculoskeletal: She exhibits no tenderness.  1+ right ankle edema  Lymphadenopathy:    She has no cervical adenopathy.  Neurological: She is alert and oriented to person, place, and time.  President-- "Elyn Peers, Lujean Amel Clinton" [phone] [id number] Recall 3/3  Skin: No rash noted. No erythema.  Psychiatric: She has a normal mood and affect. Her behavior is normal.          Assessment & Plan:

## 2015-06-04 NOTE — Patient Instructions (Signed)
Please stop the PM sleep meds. You can try melatonin--up to 2m, or even 6 mg if you needs something

## 2015-06-04 NOTE — Addendum Note (Signed)
Addended by: Despina Hidden on: 06/04/2015 05:35 PM   Modules accepted: Orders

## 2015-06-04 NOTE — Assessment & Plan Note (Signed)
I have personally reviewed the Medicare Annual Wellness questionnaire and have noted 1. The patient's medical and social history 2. Their use of alcohol, tobacco or illicit drugs 3. Their current medications and supplements 4. The patient's functional ability including ADL's, fall risks, home safety risks and hearing or visual             impairment. 5. Diet and physical activities 6. Evidence for depression or mood disorders  The patients weight, height, BMI and visual acuity have been recorded in the chart I have made referrals, counseling and provided education to the patient based review of the above and I have provided the pt with a written personalized care plan for preventive services.  I have provided you with a copy of your personalized plan for preventive services. Please take the time to review along with your updated medication list.  Mammogram due 1/17 Probably won't need another colonoscopy prevnar today Yearly flu

## 2015-06-04 NOTE — Assessment & Plan Note (Signed)
Ongoing regular symptoms Asked her to limit dicyclomine Oxycodone for severe pain

## 2015-06-04 NOTE — Progress Notes (Signed)
Pre visit review using our clinic review tool, if applicable. No additional management support is needed unless otherwise documented below in the visit note. 

## 2015-06-04 NOTE — Assessment & Plan Note (Signed)
On oral supplements

## 2015-06-04 NOTE — Assessment & Plan Note (Signed)
Rare with the propranolol

## 2015-06-05 ENCOUNTER — Other Ambulatory Visit: Payer: Self-pay | Admitting: Internal Medicine

## 2015-06-05 DIAGNOSIS — D649 Anemia, unspecified: Secondary | ICD-10-CM

## 2015-06-05 LAB — CBC WITH DIFFERENTIAL/PLATELET
BASOS PCT: 0.5 % (ref 0.0–3.0)
Basophils Absolute: 0 10*3/uL (ref 0.0–0.1)
EOS ABS: 0.3 10*3/uL (ref 0.0–0.7)
Eosinophils Relative: 3.9 % (ref 0.0–5.0)
HCT: 34.7 % — ABNORMAL LOW (ref 36.0–46.0)
HEMOGLOBIN: 11.1 g/dL — AB (ref 12.0–15.0)
Lymphocytes Relative: 25 % (ref 12.0–46.0)
Lymphs Abs: 1.9 10*3/uL (ref 0.7–4.0)
MCHC: 31.9 g/dL (ref 30.0–36.0)
MCV: 82.4 fl (ref 78.0–100.0)
MONO ABS: 0.5 10*3/uL (ref 0.1–1.0)
Monocytes Relative: 6.7 % (ref 3.0–12.0)
NEUTROS ABS: 4.9 10*3/uL (ref 1.4–7.7)
Neutrophils Relative %: 63.9 % (ref 43.0–77.0)
Platelets: 302 10*3/uL (ref 150.0–400.0)
RBC: 4.21 Mil/uL (ref 3.87–5.11)
RDW: 15.5 % (ref 11.5–15.5)
WBC: 7.7 10*3/uL (ref 4.0–10.5)

## 2015-06-05 LAB — COMPREHENSIVE METABOLIC PANEL
ALK PHOS: 50 U/L (ref 39–117)
ALT: 11 U/L (ref 0–35)
AST: 18 U/L (ref 0–37)
Albumin: 3.7 g/dL (ref 3.5–5.2)
BILIRUBIN TOTAL: 0.2 mg/dL (ref 0.2–1.2)
BUN: 12 mg/dL (ref 6–23)
CO2: 28 mEq/L (ref 19–32)
CREATININE: 0.79 mg/dL (ref 0.40–1.20)
Calcium: 9.2 mg/dL (ref 8.4–10.5)
Chloride: 103 mEq/L (ref 96–112)
GFR: 75.78 mL/min (ref >60.00)
Glucose, Bld: 106 mg/dL — ABNORMAL HIGH (ref 70–99)
Potassium: 4.8 mEq/L (ref 3.5–5.1)
Sodium: 137 mEq/L (ref 135–145)
Total Protein: 6 g/dL (ref 6.0–8.3)

## 2015-06-05 LAB — LIPID PANEL
CHOL/HDL RATIO: 4
CHOLESTEROL: 189 mg/dL (ref 0–200)
HDL: 43.8 mg/dL (ref >39.00)
NonHDL: 145.2
Triglycerides: 250 mg/dL — ABNORMAL HIGH (ref 0.0–149.0)
VLDL: 50 mg/dL — ABNORMAL HIGH (ref 0.0–40.0)

## 2015-06-05 LAB — LDL CHOLESTEROL, DIRECT: LDL DIRECT: 118 mg/dL

## 2015-06-05 LAB — T4, FREE: Free T4: 1.03 ng/dL (ref 0.60–1.60)

## 2015-06-05 LAB — VITAMIN B12: Vitamin B-12: 295 pg/mL (ref 211–911)

## 2015-06-06 ENCOUNTER — Encounter: Payer: Self-pay | Admitting: *Deleted

## 2015-06-27 ENCOUNTER — Encounter (INDEPENDENT_AMBULATORY_CARE_PROVIDER_SITE_OTHER): Payer: Self-pay

## 2015-06-27 ENCOUNTER — Other Ambulatory Visit (INDEPENDENT_AMBULATORY_CARE_PROVIDER_SITE_OTHER): Payer: Medicare Other

## 2015-06-27 DIAGNOSIS — D649 Anemia, unspecified: Secondary | ICD-10-CM

## 2015-06-27 LAB — IBC PANEL
IRON: 39 ug/dL — AB (ref 42–145)
Saturation Ratios: 9.6 % — ABNORMAL LOW (ref 20.0–50.0)
Transferrin: 291 mg/dL (ref 212.0–360.0)

## 2015-06-27 LAB — CBC WITH DIFFERENTIAL/PLATELET
BASOS ABS: 0 10*3/uL (ref 0.0–0.1)
BASOS PCT: 0.4 % (ref 0.0–3.0)
Eosinophils Absolute: 0.3 10*3/uL (ref 0.0–0.7)
Eosinophils Relative: 3.4 % (ref 0.0–5.0)
HCT: 34.2 % — ABNORMAL LOW (ref 36.0–46.0)
HEMOGLOBIN: 11.1 g/dL — AB (ref 12.0–15.0)
LYMPHS ABS: 2.2 10*3/uL (ref 0.7–4.0)
Lymphocytes Relative: 28.4 % (ref 12.0–46.0)
MCHC: 32.4 g/dL (ref 30.0–36.0)
MCV: 80.5 fl (ref 78.0–100.0)
MONOS PCT: 7.1 % (ref 3.0–12.0)
Monocytes Absolute: 0.5 10*3/uL (ref 0.1–1.0)
NEUTROS ABS: 4.6 10*3/uL (ref 1.4–7.7)
Neutrophils Relative %: 60.7 % (ref 43.0–77.0)
PLATELETS: 298 10*3/uL (ref 150.0–400.0)
RBC: 4.24 Mil/uL (ref 3.87–5.11)
RDW: 15.6 % — ABNORMAL HIGH (ref 11.5–15.5)
WBC: 7.7 10*3/uL (ref 4.0–10.5)

## 2015-06-27 LAB — FERRITIN: FERRITIN: 5.5 ng/mL — AB (ref 10.0–291.0)

## 2015-07-02 ENCOUNTER — Other Ambulatory Visit: Payer: Self-pay | Admitting: *Deleted

## 2015-07-02 ENCOUNTER — Encounter: Payer: Self-pay | Admitting: Primary Care

## 2015-07-02 ENCOUNTER — Ambulatory Visit (INDEPENDENT_AMBULATORY_CARE_PROVIDER_SITE_OTHER): Payer: Medicare Other | Admitting: Primary Care

## 2015-07-02 VITALS — BP 122/68 | HR 61 | Temp 97.9°F | Ht 63.0 in | Wt 122.1 lb

## 2015-07-02 DIAGNOSIS — H00016 Hordeolum externum left eye, unspecified eyelid: Secondary | ICD-10-CM | POA: Diagnosis not present

## 2015-07-02 MED ORDER — FERROUS FUMARATE 325 (106 FE) MG PO TABS: 1.0000 | ORAL_TABLET | Freq: Every day | ORAL | Status: DC

## 2015-07-02 NOTE — Progress Notes (Signed)
Pre visit review using our clinic review tool, if applicable. No additional management support is needed unless otherwise documented below in the visit note. 

## 2015-07-02 NOTE — Patient Instructions (Addendum)
You may try a daily antihistamine such as Zyrtec, Claritin, or Allegra for a few weeks.  Apply warm compresses three times daily until reduction of swelling.  Please call me Friday if no improvement. It was nice to meet you!   Sty A sty (hordeolum) is an infection of a gland in the eyelid located at the base of the eyelash. A sty may develop a white or yellow head of pus. It can be puffy (swollen). Usually, the sty will burst and pus will come out on its own. They do not leave lumps in the eyelid once they drain. A sty is often confused with another form of cyst of the eyelid called a chalazion. Chalazions occur within the eyelid and not on the edge where the bases of the eyelashes are. They often are red, sore and then form firm lumps in the eyelid. CAUSES   Germs (bacteria).  Lasting (chronic) eyelid inflammation. SYMPTOMS   Tenderness, redness and swelling along the edge of the eyelid at the base of the eyelashes.  Sometimes, there is a white or yellow head of pus. It may or may not drain. DIAGNOSIS  An ophthalmologist will be able to distinguish between a sty and a chalazion and treat the condition appropriately.  TREATMENT   Styes are typically treated with warm packs (compresses) until drainage occurs.  In rare cases, medicines that kill germs (antibiotics) may be prescribed. These antibiotics may be in the form of drops, cream or pills.  If a hard lump has formed, it is generally necessary to do a small incision and remove the hardened contents of the cyst in a minor surgical procedure done in the office.  In suspicious cases, your caregiver may send the contents of the cyst to the lab to be certain that it is not a rare, but dangerous form of cancer of the glands of the eyelid. HOME CARE INSTRUCTIONS   Wash your hands often and dry them with a clean towel. Avoid touching your eyelid. This may spread the infection to other parts of the eye.  Apply heat to your eyelid for 10  to 20 minutes, several times a day, to ease pain and help to heal it faster.  Do not squeeze the sty. Allow it to drain on its own. Wash your eyelid carefully 3 to 4 times per day to remove any pus. SEEK IMMEDIATE MEDICAL CARE IF:   Your eye becomes painful or puffy (swollen).  Your vision changes.  Your sty does not drain by itself within 3 days.  Your sty comes back within a short period of time, even with treatment.  You have redness (inflammation) around the eye.  You have a fever. Document Released: 09/03/2005 Document Revised: 02/16/2012 Document Reviewed: 03/10/2014 [facility] Patient Information 2015 [place], [place]. This information is not intended to replace advice given to you by your health care provider. Make sure you discuss any questions you have with your health care provider.

## 2015-07-02 NOTE — Progress Notes (Signed)
Subjective:    Patient ID: [name], female    DOB: [birth year], 74 y.o.   MRN: [id number]  HPI  [name] is a 74 year old female who presents today with a chief complaint of swollen eye. The swelling is located to her left eye and has been present since Friday. Her symptoms include itching, swelling, mild redness, soreness to the outer left upper lid. She will have increased tearing in the morning with a reduction throughout the day. Denies changes in vision, fevers, chills. She's been using visine eye drops without relief.  Review of Systems  Constitutional: Negative for fever and chills.  HENT: Positive for sneezing.   Eyes: Positive for itching. Negative for visual disturbance.  Respiratory: Negative for cough and shortness of breath.   Cardiovascular: Negative for chest pain.       Past Medical History  Diagnosis Date  . Anxiety   . GERD (gastroesophageal reflux disease)     Dr. [name]  . HLD (hyperlipidemia)     Dr. Lia Foyer  . Osteopenia 2005  . OA (osteoarthritis)   . IBS (irritable bowel syndrome)   . Migraine   . Ulcer 2003    bleeding stomach ulcer  . LBBB (left bundle branch block)     History   Social History  . Marital Status: Married    Spouse Name: N/A  . Number of Children: N/A  . Years of Education: N/A   Occupational History  . Retired Database administrator asst.    Social History Main Topics  . Smoking status: Never Smoker   . Smokeless tobacco: Never Used  . Alcohol Use: No  . Drug Use: No  . Sexual Activity: Not on file   Other Topics Concern  . Not on file   Social History Narrative   Has living will   Husband, then daughters, are [place] health care POA   Would accept resuscitation attempts but no prolonged artifical ventilation   No prolonged tube feeds if cognitively impaired    Past Surgical History  Procedure Laterality Date  . Cardiac catheterization  1997    okay, mild changes  . Tonsillectomy and  adenoidectomy  1953  . Cholecystectomy  1992  . Abdominal hysterectomy  1983  . Appendectomy  1983  . Dexa N/A 2005    borderline osteopenia  . Colonoscopy  2003  . Upper gastrointestinal endoscopy  2003    Family History  Problem Relation Age of Onset  . Heart attack Father 49  . Diabetes Father   . Coronary artery disease Father   . COPD Mother   . Hypertension Mother   . Pulmonary fibrosis Brother 29  . Hypertension Sister   . Cancer Daughter     breast  . Rheum arthritis Daughter   . Colon cancer Neg Hx     Allergies  Allergen Reactions  . Clonazepam     REACTION: Swollen lips, sore mouth  . Penicillins     REACTION: unrecalled reaction    Current Outpatient Prescriptions on File Prior to Visit  Medication Sig Dispense Refill  . calcium-vitamin D (OSCAL WITH D 250-125) 250-125 MG-UNIT per tablet Take 1 tablet by mouth daily.      . clobetasol cream (TEMOVATE) 0.05 % Apply topically 2 (two) times daily.    . clorazepate (TRANXENE) 7.5 MG tablet Take 1 tablet (7.5 mg total) by mouth 2 (two) times daily as needed. 60 tablet 0  . dicyclomine (BENTYL) 20 MG tablet     .  docusate sodium (COLACE) 100 MG capsule Take 100 mg by mouth as directed.      . fish oil-omega-3 fatty acids 1000 MG capsule Take 2 g by mouth daily.      . Multiple Vitamin (MULTIVITAMIN) tablet Take 1 tablet by mouth daily.      . ondansetron (ZOFRAN) 4 MG tablet     . oxyCODONE-acetaminophen (PERCOCET/ROXICET) 5-325 MG per tablet Take 1 tablet by mouth every 6 (six) hours as needed. 20 tablet 0  . Potassium Gluconate 595 MG CAPS Take by mouth every other day.    . pravastatin (PRAVACHOL) 20 MG tablet Take 1 tablet (20 mg total) by mouth daily. 90 tablet 3  . propranolol (INDERAL) 40 MG tablet Take 1 tablet (40 mg total) by mouth 2 (two) times daily. 180 tablet 3  . vitamin B-12 (CYANOCOBALAMIN) 1000 MCG tablet Take 1,000 mcg by mouth daily.     No current facility-administered medications on file  prior to visit.    BP 122/68 mmHg  Pulse 61  Temp(Src) 97.9 F (36.6 C) (Oral)  Ht 5' 3"  (1.6 m)  Wt 122 lb 1.9 oz (55.393 kg)  BMI 21.64 kg/m2  SpO2 98%    Objective:   Physical Exam  Eyes: Conjunctivae are normal. Pupils are equal, round, and reactive to light. Right eye exhibits no discharge. Left eye exhibits no discharge.  Mild swelling and redness to left upper lid. No collection of pus noted. Tender upon exam. PERRL. No drainage to eye.  Cardiovascular: Normal rate and regular rhythm.   Pulmonary/Chest: Effort normal and breath sounds normal.  Skin: Skin is warm and dry.          Assessment & Plan:  Sty:  Mild swelling, redness, and tenderness to left upper lid since Friday. Appears to be sty. No drainage. Sclera white, conjunctiva clear. No obvious sign of infection. Apply warm compresses three times daily. May try daily antihistamine for itching. Follow up Friday if no improvement.

## 2015-07-05 DIAGNOSIS — L821 Other seborrheic keratosis: Secondary | ICD-10-CM | POA: Diagnosis not present

## 2015-07-05 DIAGNOSIS — L820 Inflamed seborrheic keratosis: Secondary | ICD-10-CM | POA: Diagnosis not present

## 2015-07-05 DIAGNOSIS — L900 Lichen sclerosus et atrophicus: Secondary | ICD-10-CM | POA: Diagnosis not present

## 2015-07-05 DIAGNOSIS — D1800 Hemangioma unspecified site: Secondary | ICD-10-CM | POA: Diagnosis not present

## 2015-07-10 ENCOUNTER — Other Ambulatory Visit: Payer: Self-pay | Admitting: Obstetrics & Gynecology

## 2015-07-10 DIAGNOSIS — Z1231 Encounter for screening mammogram for malignant neoplasm of breast: Secondary | ICD-10-CM

## 2015-07-20 ENCOUNTER — Ambulatory Visit (HOSPITAL_COMMUNITY)
Admission: RE | Admit: 2015-07-20 | Discharge: 2015-07-20 | Disposition: A | Discharge status: Home / Self Care | Payer: Medicare Other | Source: Ambulatory Visit | Attending: Obstetrics & Gynecology | Admitting: Obstetrics & Gynecology

## 2015-07-20 DIAGNOSIS — Z1231 Encounter for screening mammogram for malignant neoplasm of breast: Secondary | ICD-10-CM | POA: Diagnosis not present

## 2015-07-20 IMAGING — MG MM DIGITAL SCREENING BILAT
4 series · 4 of 4 positions shown · non-contrast
Comparison: Previous exam(s).

CLINICAL DATA: Screening.

EXAM:
DIGITAL SCREENING BILATERAL MAMMOGRAM WITH CAD

[R CC]
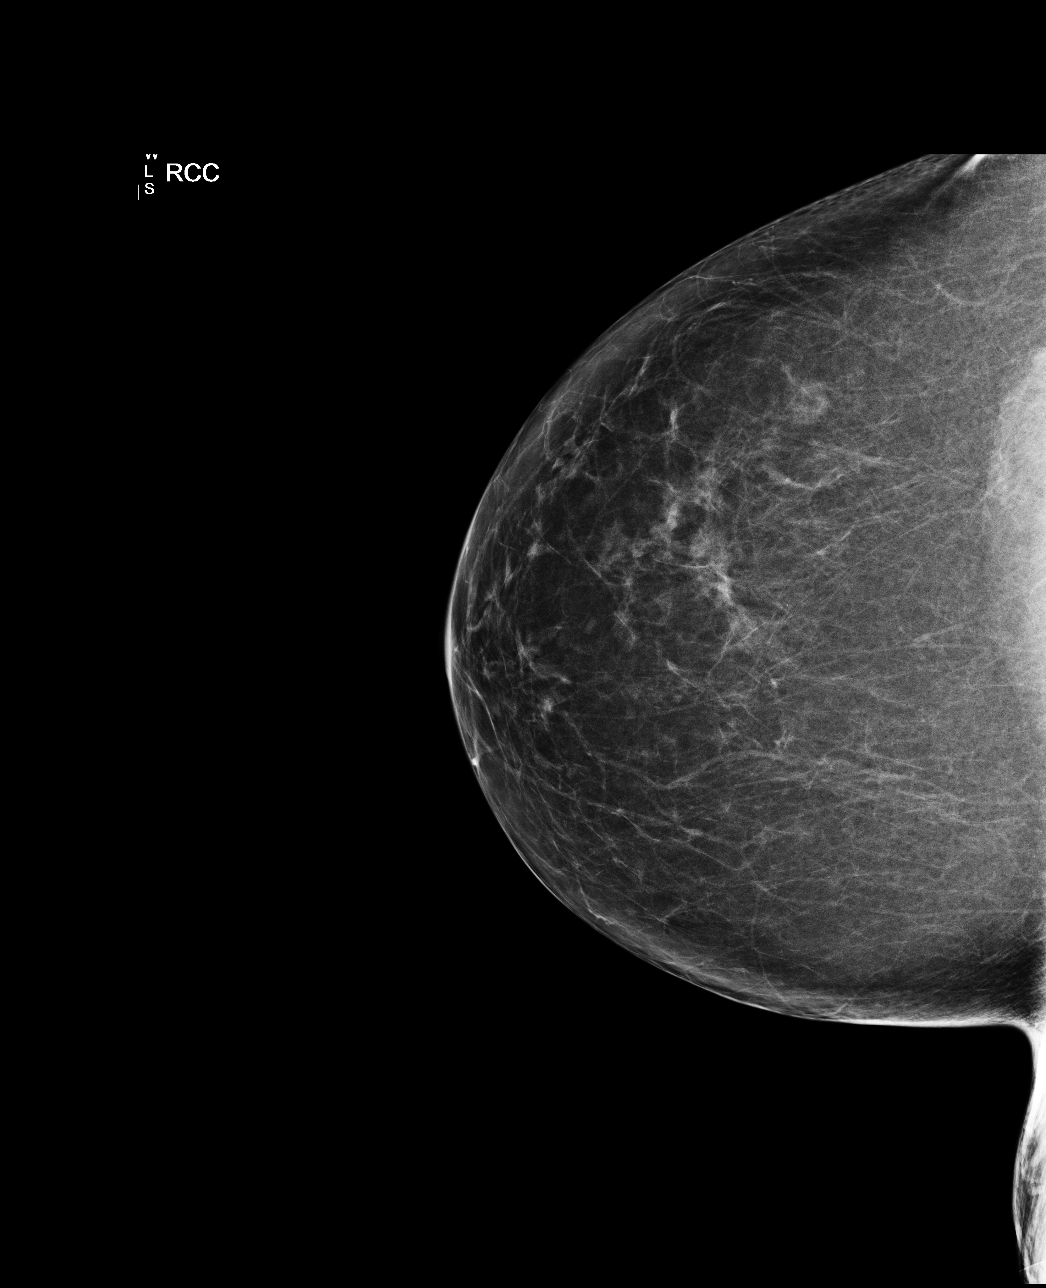

[R MLO]
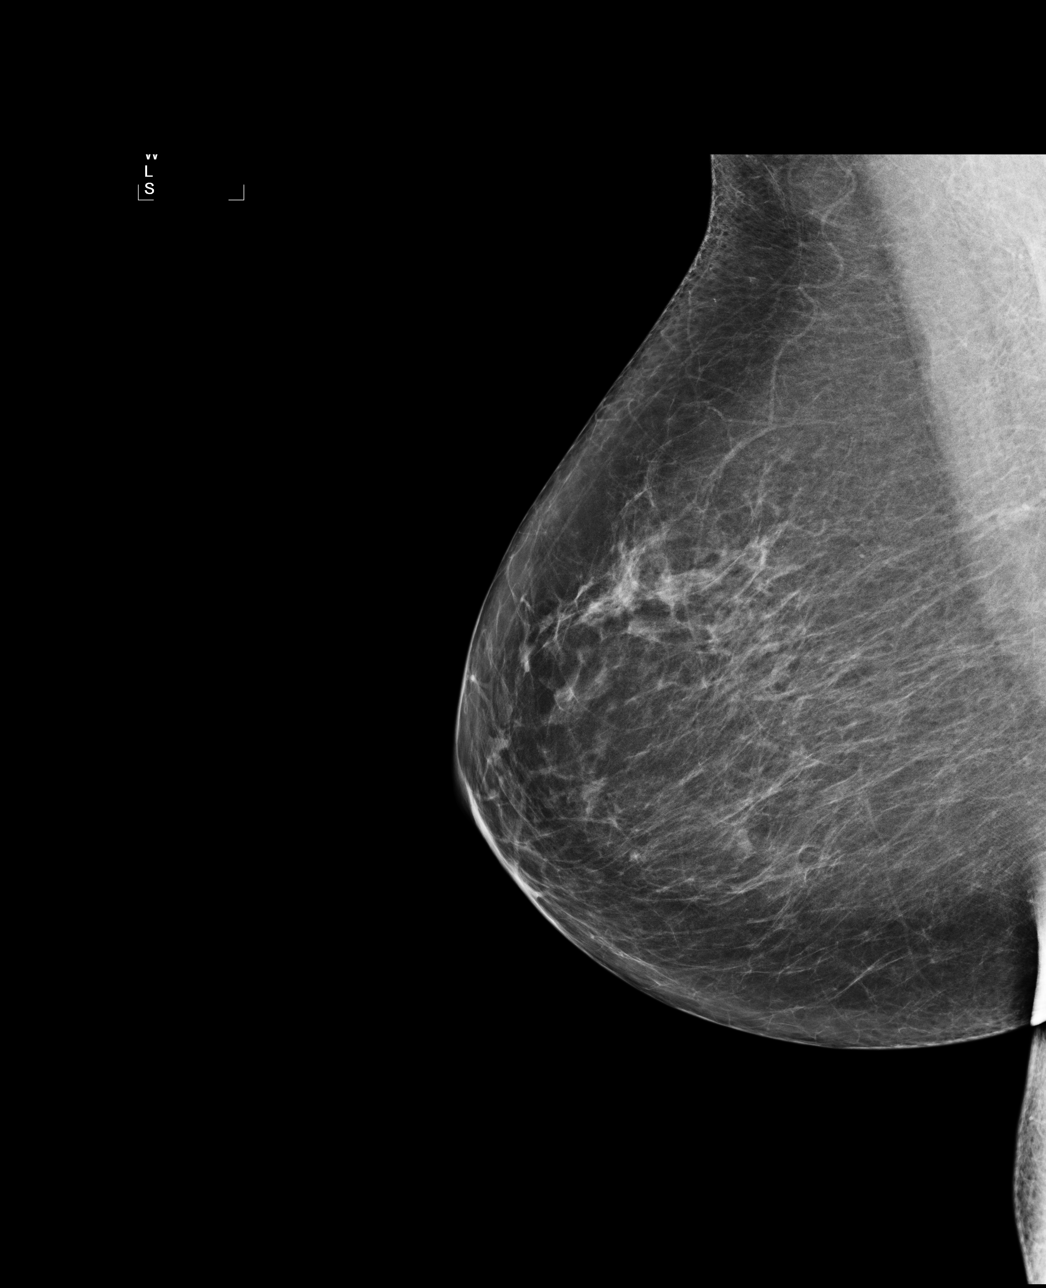

[L CC]
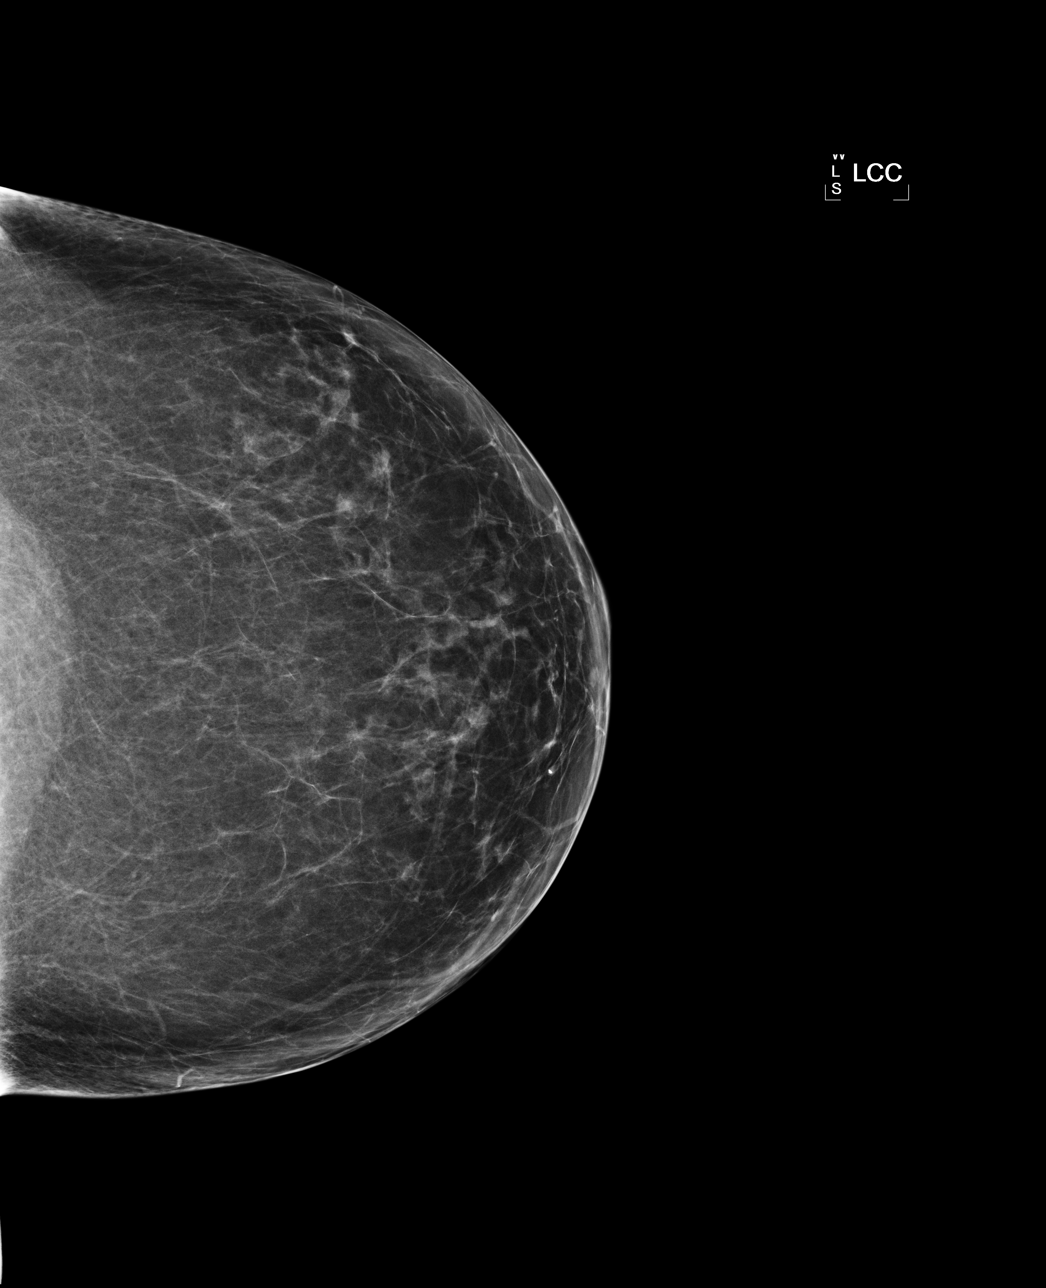

[L MLO]
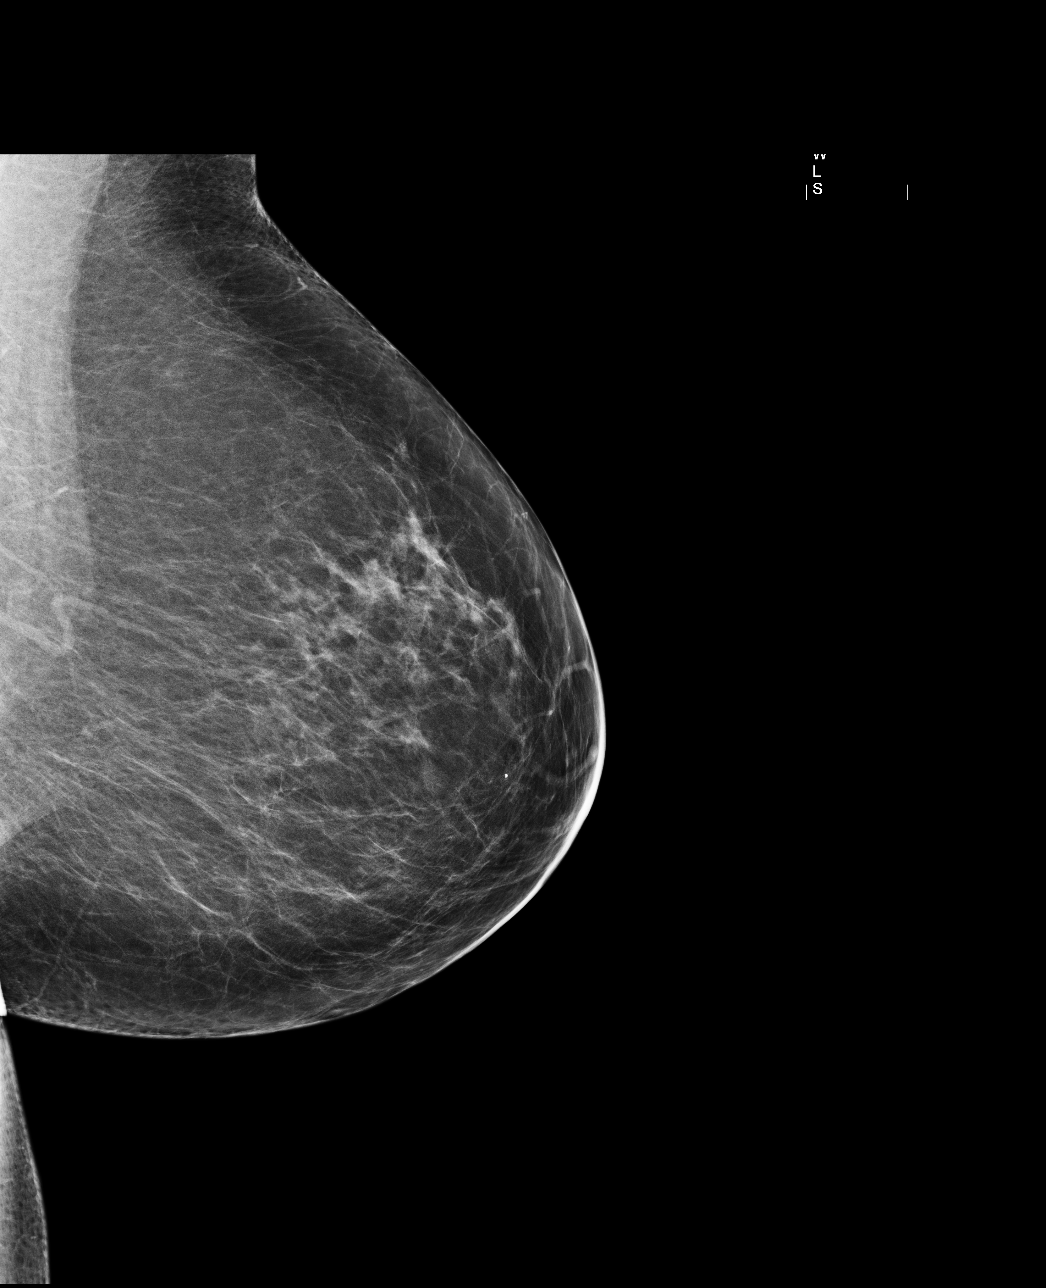

[4 of 4 positions shown; findings below may reference images not displayed]

ACR Breast Density Category b: There are scattered areas of
fibroglandular density.
FINDINGS: There are no findings suspicious for malignancy. Images were
processed with CAD.
IMPRESSION: No mammographic evidence of malignancy. A result letter of this
screening mammogram will be mailed directly to the patient.

RECOMMENDATION:
Screening mammogram in one year. (Code:[US])

BI-RADS CATEGORY  1: Negative.

## 2015-08-07 ENCOUNTER — Other Ambulatory Visit: Payer: Self-pay | Admitting: Internal Medicine

## 2015-08-07 DIAGNOSIS — D508 Other iron deficiency anemias: Secondary | ICD-10-CM

## 2015-08-14 ENCOUNTER — Other Ambulatory Visit (INDEPENDENT_AMBULATORY_CARE_PROVIDER_SITE_OTHER): Payer: Medicare Other

## 2015-08-14 DIAGNOSIS — D508 Other iron deficiency anemias: Secondary | ICD-10-CM | POA: Diagnosis not present

## 2015-08-14 LAB — CBC WITH DIFFERENTIAL/PLATELET
BASOS PCT: 0.7 % (ref 0.0–3.0)
Basophils Absolute: 0 10*3/uL (ref 0.0–0.1)
EOS PCT: 3.2 % (ref 0.0–5.0)
Eosinophils Absolute: 0.2 10*3/uL (ref 0.0–0.7)
HCT: 37.5 % (ref 36.0–46.0)
HEMOGLOBIN: 12.2 g/dL (ref 12.0–15.0)
LYMPHS ABS: 2.1 10*3/uL (ref 0.7–4.0)
Lymphocytes Relative: 32.8 % (ref 12.0–46.0)
MCHC: 32.5 g/dL (ref 30.0–36.0)
MCV: 84.1 fl (ref 78.0–100.0)
Monocytes Absolute: 0.5 10*3/uL (ref 0.1–1.0)
Monocytes Relative: 8 % (ref 3.0–12.0)
NEUTROS PCT: 55.3 % (ref 43.0–77.0)
Neutro Abs: 3.5 10*3/uL (ref 1.4–7.7)
PLATELETS: 253 10*3/uL (ref 150.0–400.0)
RBC: 4.46 Mil/uL (ref 3.87–5.11)
RDW: 19.1 % — AB (ref 11.5–15.5)
WBC: 6.3 10*3/uL (ref 4.0–10.5)

## 2015-08-22 DIAGNOSIS — H35341 Macular cyst, hole, or pseudohole, right eye: Secondary | ICD-10-CM | POA: Diagnosis not present

## 2015-09-24 ENCOUNTER — Ambulatory Visit (INDEPENDENT_AMBULATORY_CARE_PROVIDER_SITE_OTHER): Payer: Medicare Other

## 2015-09-24 DIAGNOSIS — Z23 Encounter for immunization: Secondary | ICD-10-CM

## 2015-09-25 ENCOUNTER — Encounter: Payer: Self-pay | Admitting: Cardiology

## 2016-01-11 DIAGNOSIS — L900 Lichen sclerosus et atrophicus: Secondary | ICD-10-CM | POA: Diagnosis not present

## 2016-02-09 ENCOUNTER — Emergency Department
Admission: EM | Admit: 2016-02-09 | Discharge: 2016-02-09 | Disposition: A | Discharge status: Home / Self Care | Payer: Medicare Other | Attending: Emergency Medicine | Admitting: Emergency Medicine

## 2016-02-09 ENCOUNTER — Emergency Department: Payer: Medicare Other

## 2016-02-09 DIAGNOSIS — Z79899 Other long term (current) drug therapy: Secondary | ICD-10-CM | POA: Diagnosis not present

## 2016-02-09 DIAGNOSIS — R51 Headache: Secondary | ICD-10-CM | POA: Diagnosis not present

## 2016-02-09 DIAGNOSIS — Z880 Allergy status to penicillin: Secondary | ICD-10-CM | POA: Insufficient documentation

## 2016-02-09 DIAGNOSIS — M5481 Occipital neuralgia: Secondary | ICD-10-CM

## 2016-02-09 IMAGING — CT CT HEAD W/O CM
1 series · 16 of 30 positions shown, 20 images · non-contrast
Comparison: [DATE]

CLINICAL DATA: Headache behind the left ear since yesterday.
History of migraines.

EXAM:
CT HEAD WITHOUT CONTRAST
TECHNIQUE: Contiguous axial images were obtained from the base of the skull
through the vertex without intravenous contrast.

[Series 2: soft tissue · axial · 0.39mm/px · z∈[-137,-2]mm · 16 of 30 slices shown, 20 images]
[im 2/30  brain]
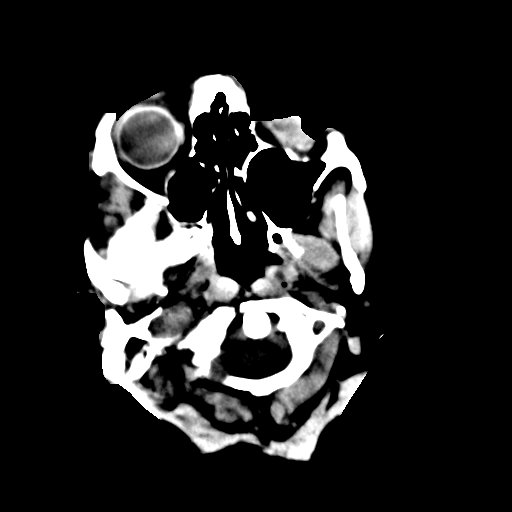
[im 2/30  bone]
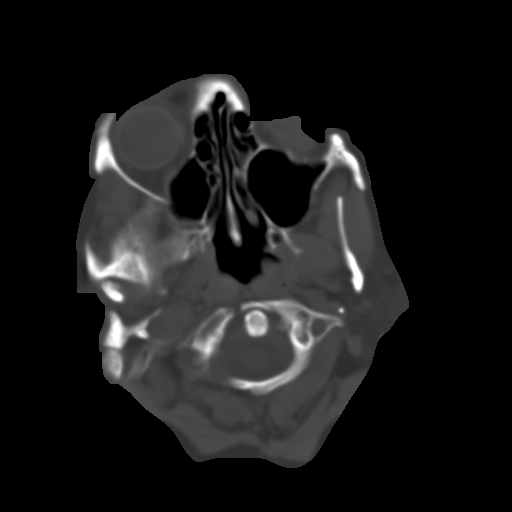
[im 4/30  brain]
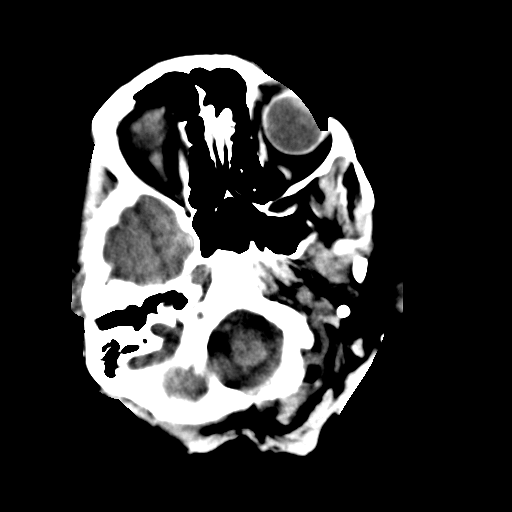
[im 6/30  brain]
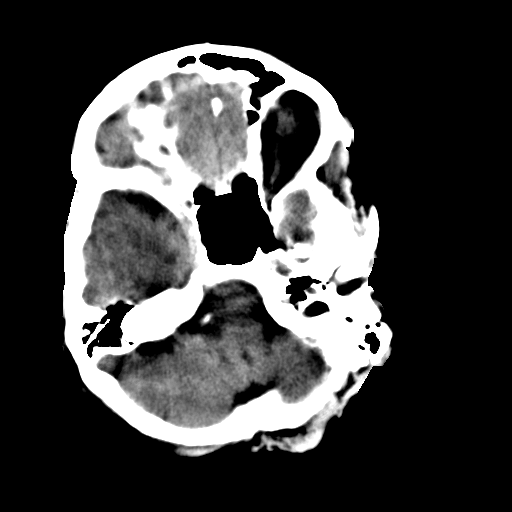
[im 8/30  brain]
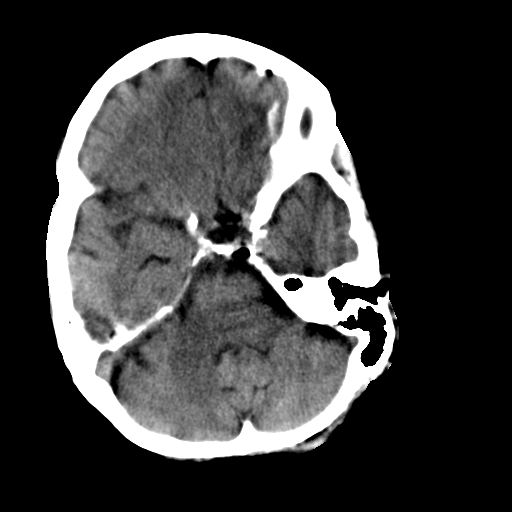
[im 9/30  brain]
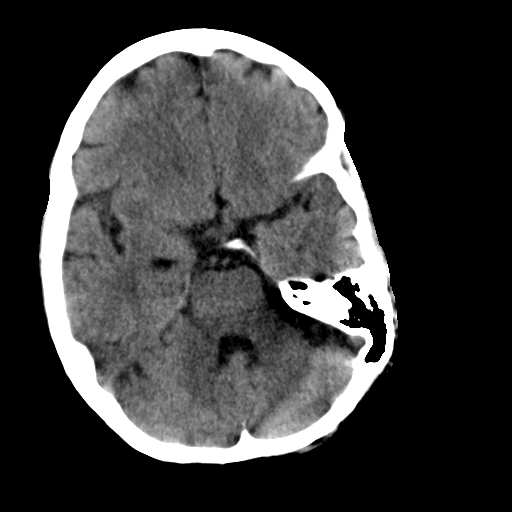
[im 9/30  bone]
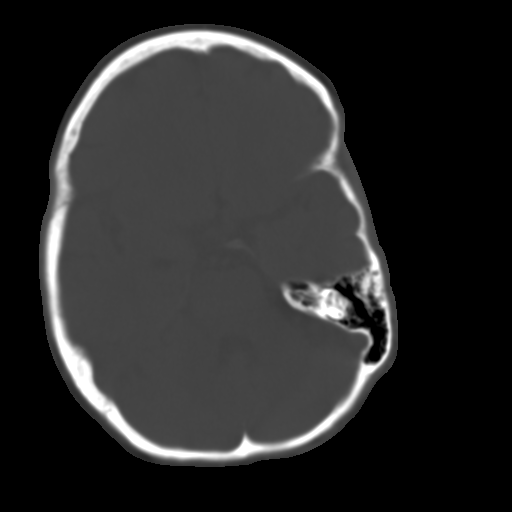
[im 11/30  brain]
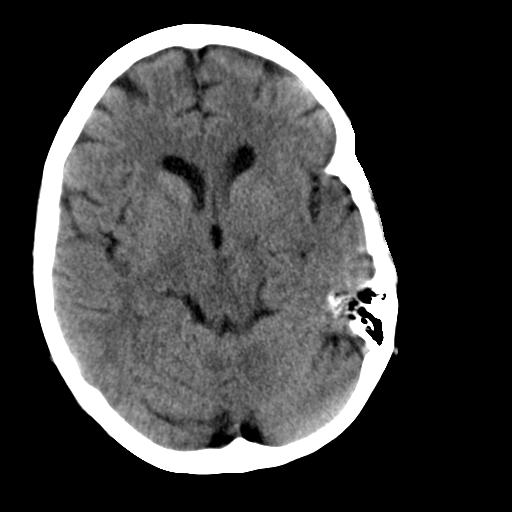
[im 13/30  brain]
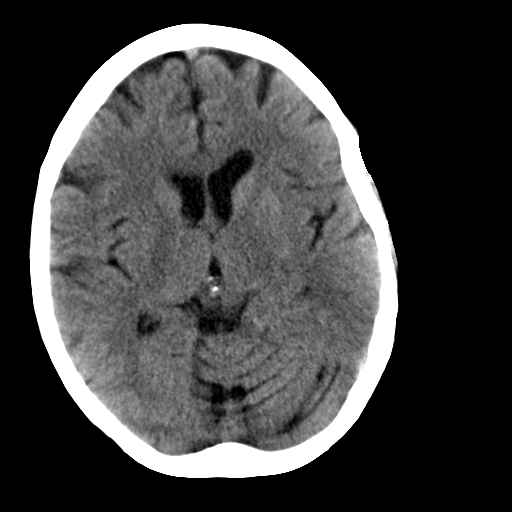
[im 15/30  brain]
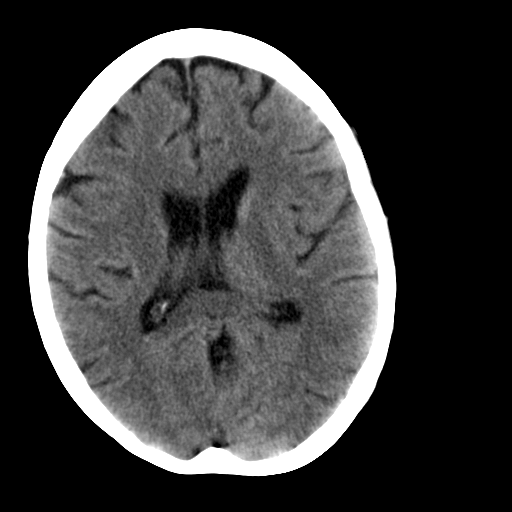
[im 16/30  brain]
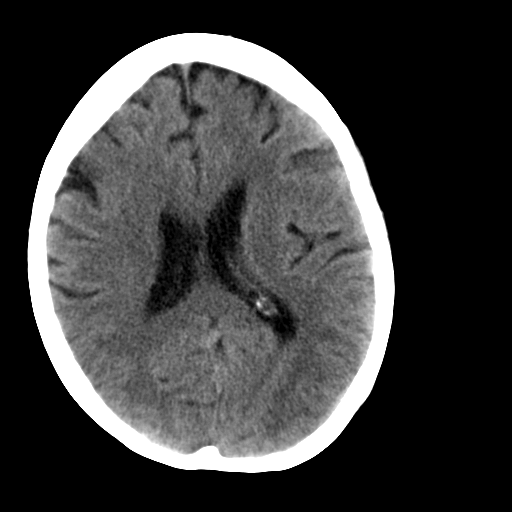
[im 16/30  bone]
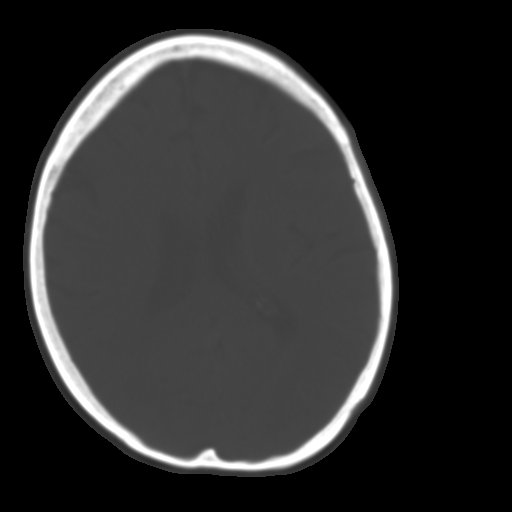
[im 18/30  brain]
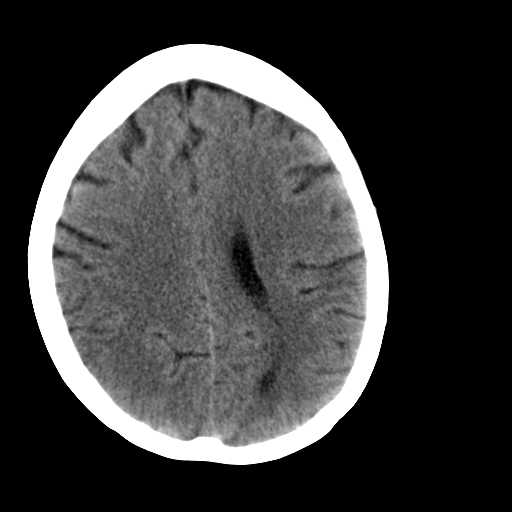
[im 20/30  brain]
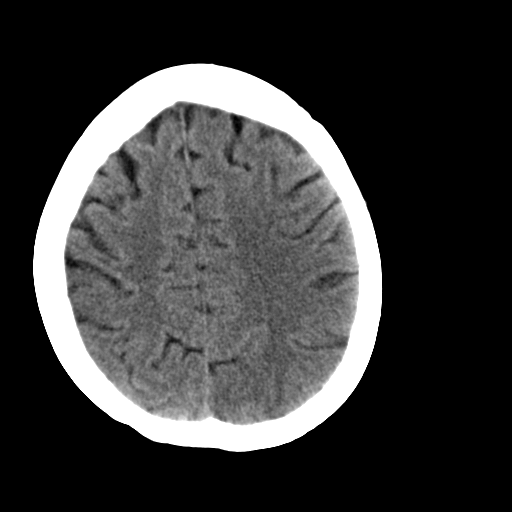
[im 22/30  brain]
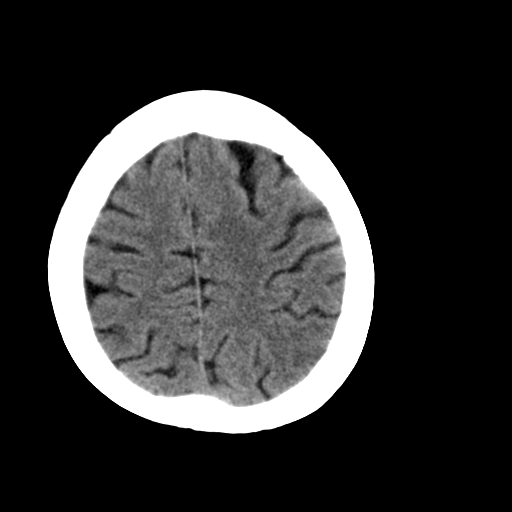
[im 23/30  brain]
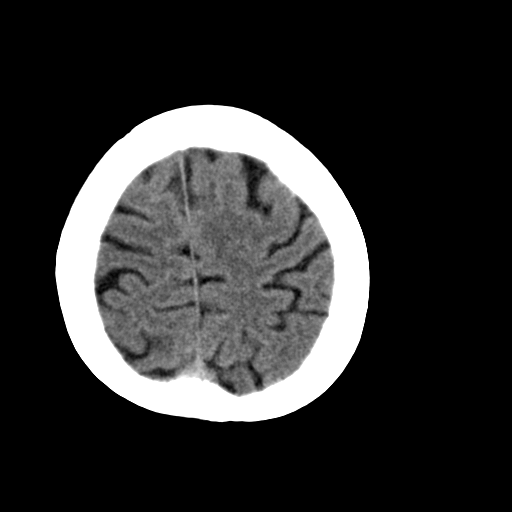
[im 23/30  bone]
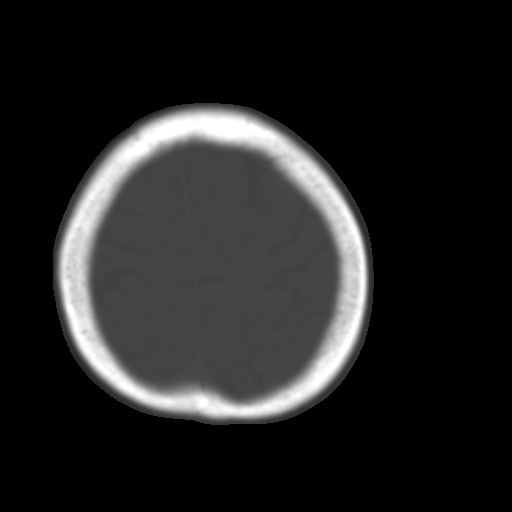
[im 25/30  brain]
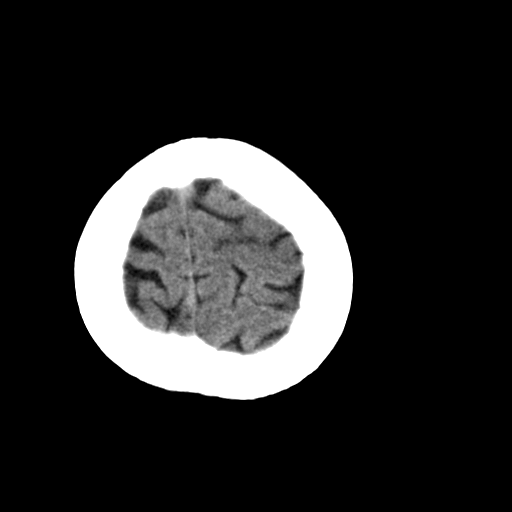
[im 27/30  brain]
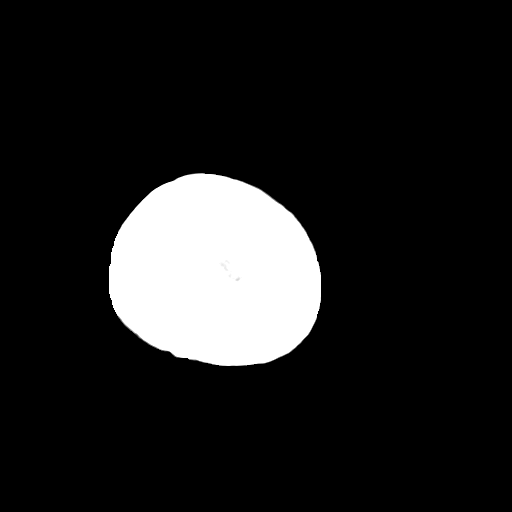
[im 29/30  brain]
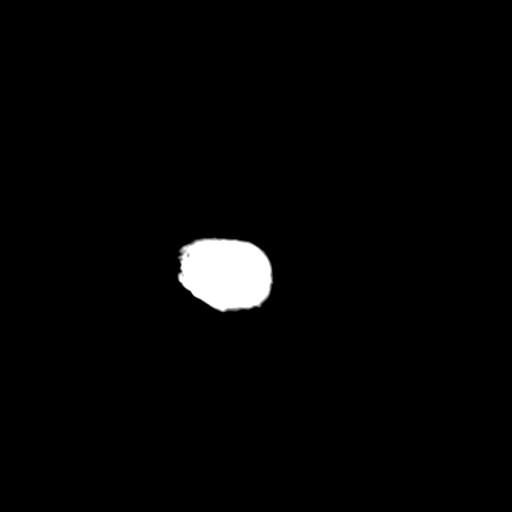

[16 of 30 positions shown; findings below may reference images not displayed]

FINDINGS: Examination is minimally degraded due to patient obliquity.

Gray-white differentiation is maintained. No CT evidence of acute
large territory infarct. No intraparenchymal extra-axial mass or
hemorrhage. Unchanged size and configuration of the ventricles and
basilar cisterns with unchanged mild prominence of the occipital
horn of the left lateral ventricle, unchanged since the [DATE]
examination. No midline shift. Limited visualization of the
paranasal sinuses and mastoid air cells is normal, with special
attention paid to the left mastoid air cells. No air-fluid levels.
Regional soft tissues appear normal. No displaced calvarial
fracture.
IMPRESSION: Negative noncontrast head CT.

## 2016-02-09 MED ORDER — BUPIVACAINE HCL 0.5 % IJ SOLN
50.0000 mL | Freq: Once | INTRAMUSCULAR | Status: AC
  Administered 2016-02-09: 50 mL
  Filled 2016-02-09: qty 50

## 2016-02-09 MED ORDER — PREDNISONE 20 MG PO TABS: 40.0000 mg | ORAL_TABLET | Freq: Every day | ORAL | Status: DC

## 2016-02-09 MED ORDER — TRAMADOL HCL 50 MG PO TABS: 50.0000 mg | ORAL_TABLET | Freq: Four times a day (QID) | ORAL | Status: DC | PRN

## 2016-02-09 MED ORDER — LIDOCAINE HCL (PF) 1 % IJ SOLN
5.0000 mL | Freq: Once | INTRAMUSCULAR | Status: AC
  Administered 2016-02-09: 5 mL
  Filled 2016-02-09: qty 5

## 2016-02-09 NOTE — Discharge Instructions (Signed)
Please seek medical attention for any high fevers, chest pain, shortness of breath, change in behavior, persistent vomiting, bloody stool or any other new or concerning symptoms.   Occipital Neuralgia Occipital neuralgia is a type of headache that causes episodes of very bad pain in the back of your head. Pain from occipital neuralgia may spread (radiate) to other parts of your head. The pain is usually brief and often goes away after you rest and relax. These headaches may be caused by irritation of the nerves that leave your spinal cord high up in your neck, just below the base of your skull (occipital nerves). Your occipital nerves transmit sensations from the back of your head, the top of your head, and the areas behind your ears. CAUSES Occipital neuralgia can occur without any known cause (primary headache syndrome). In other cases, occipital neuralgia is caused by pressure on or irritation of one of the two occipital nerves. Causes of occipital nerve compression or irritation include:  Wear and tear of the vertebrae in the neck (osteoarthritis).  Neck injury.  Disease of the disks that separate the vertebrae.  Tumors.  Gout.  Infections.  Diabetes.  Swollen blood vessels that put pressure on the occipital nerves.  Muscle spasm in the neck. SIGNS AND SYMPTOMS Pain is the main symptom of occipital neuralgia. It usually starts in the back of the head but may also be felt in other areas supplied by the occipital nerves. Pain is usually on one side but may be on both sides. You may have:   Brief episodes of very bad pain that is burning, stabbing, shocking, or shooting.  Pain behind the eye.  Pain triggered by neck movement or hair brushing.  Scalp tenderness.  Aching in the back of the head between episodes of very bad pain. DIAGNOSIS  Your health care provider may diagnose occipital neuralgia based on your symptoms and a physical exam. During the exam, the health care  provider may push on areas supplied by the occipital nerves to see if they are painful. Some tests may also be done to help in making the diagnosis. These may include:  Imaging studies of the upper spinal cord, such as an MRI or CT scan. These may show compression or spinal cord abnormalities.  Nerve block. You will get an injection of numbing medicine (local anesthetic) near the occipital nerve to see if this relieves pain. TREATMENT  Treatment may begin with simple measures, such as:   Rest.  Massage.  Heat.  Over-the-counter pain relievers. If these measures do not work, you may need other treatments, including:  Medicines such as:  Prescription-strength anti-inflammatory medicines.  Muscle relaxants.  Antiseizure medicines.  Antidepressants.  Steroid injection. This involves injections of local anesthetic and strong anti-inflammatory drugs (steroids).  Pulsed radiofrequency. Wires are implanted to deliver electrical impulses that block pain signals from the occipital nerve.  Physical therapy.  Surgery to relieve nerve pressure. HOME CARE INSTRUCTIONS  Take all medicines as directed by your health care provider.  Avoid activities that cause pain.  Rest when you have an attack of pain.  Try gentle massage or a heating pad to relieve pain.  Work with a physical therapist to learn stretching exercises you can do at home.  Try a different pillow or sleeping position.  Practice good posture.  Try to stay active. Get regular exercise that does not cause pain. Ask your health care provider to suggest safe exercises for you.  Keep all follow-up visits as directed  by your health care provider. This is important. SEEK MEDICAL CARE IF:  Your medicine is not working.  You have new or worsening symptoms. SEEK IMMEDIATE MEDICAL CARE IF:  You have very bad head pain that is not going away.  You have a sudden change in vision, balance, or speech. MAKE SURE  YOU:  Understand these instructions.  Will watch your condition.  Will get help right away if you are not doing well or get worse.   This information is not intended to replace advice given to you by your health care provider. Make sure you discuss any questions you have with your health care provider.   Document Released: 11/18/2001 Document Revised: 12/15/2014 Document Reviewed: 11/16/2013 Elsevier Interactive Patient Education [employer].

## 2016-02-09 NOTE — ED Provider Notes (Signed)
[facility] Emergency Department Provider Note   ____________________________________________  Time seen: ~1200  I have reviewed the triage vital signs and the nursing notes.   HISTORY  Chief Complaint Headache   History limited by: Not Limited   HPI [name] is a 75 y.o. female who presents to the emergency department today because of concerns for severe head pain. She states started 2 days ago. It has been intermittent but fairly constant. She describes it as severe. It is sharp. It is located behind her left ear. She denies any change in her vision or hearing. She states she has had some associated nausea but no vomiting. She denies any trauma to her head. No fevers. She states that she has not had this exact pain in the past. She has had similar sharp pains and migraines but typically that occurs in the top part of her head.    Past Medical History  Diagnosis Date  . Anxiety   . GERD (gastroesophageal reflux disease)     Dr. [name]  . HLD (hyperlipidemia)     Dr. Lia Foyer  . Osteopenia 2005  . OA (osteoarthritis)   . IBS (irritable bowel syndrome)   . Migraine   . Ulcer 2003    bleeding stomach ulcer  . LBBB (left bundle branch block)     Patient Active Problem List   Diagnosis Date Noted  . Vitamin B12 deficiency 10/15/2012  . Classic migraine with aura 04/16/2012  . Routine general medical examination at a health care facility 04/16/2011  . ACTINIC KERATOSIS 04/02/2010  . LICHEN PLANUS 31/51/7616  . OSTEOARTHRITIS 07/20/2007  . Hyperlipemia 07/08/2007  . Episodic mood disorder ([place]) 07/08/2007  . GERD 07/08/2007  . IBS 07/08/2007  . OSTEOPENIA 07/08/2007    Past Surgical History  Procedure Laterality Date  . Cardiac catheterization  1997    okay, mild changes  . Tonsillectomy and adenoidectomy  1953  . Cholecystectomy  1992  . Abdominal hysterectomy  1983  . Appendectomy  1983  . Dexa N/A 2005    borderline osteopenia   . Colonoscopy  2003  . Upper gastrointestinal endoscopy  2003    Current Outpatient Rx  Name  Route  Sig  Dispense  Refill  . calcium-vitamin D (OSCAL WITH D 250-125) 250-125 MG-UNIT per tablet   Oral   Take 1 tablet by mouth daily.           . clobetasol cream (TEMOVATE) 0.05 %   Topical   Apply topically 2 (two) times daily.         . clorazepate (TRANXENE) 7.5 MG tablet   Oral   Take 1 tablet (7.5 mg total) by mouth 2 (two) times daily as needed.   60 tablet   0   . dicyclomine (BENTYL) 20 MG tablet               . docusate sodium (COLACE) 100 MG capsule   Oral   Take 100 mg by mouth as directed.           . ferrous fumarate (HEMOCYTE - 106 MG FE) 325 (106 FE) MG TABS tablet   Oral   Take 1 tablet (106 mg of iron total) by mouth daily.   30 each   3   . fish oil-omega-3 fatty acids 1000 MG capsule   Oral   Take 2 g by mouth daily.           . Multiple Vitamin (MULTIVITAMIN) tablet  Oral   Take 1 tablet by mouth daily.           . ondansetron (ZOFRAN) 4 MG tablet               . oxyCODONE-acetaminophen (PERCOCET/ROXICET) 5-325 MG per tablet   Oral   Take 1 tablet by mouth every 6 (six) hours as needed.   20 tablet   0   . Potassium Gluconate 595 MG CAPS   Oral   Take by mouth every other day.         . pravastatin (PRAVACHOL) 20 MG tablet   Oral   Take 1 tablet (20 mg total) by mouth daily.   90 tablet   3   . propranolol (INDERAL) 40 MG tablet   Oral   Take 1 tablet (40 mg total) by mouth 2 (two) times daily.   180 tablet   3   . vitamin B-12 (CYANOCOBALAMIN) 1000 MCG tablet   Oral   Take 1,000 mcg by mouth daily.           Allergies Clonazepam and Penicillins  Family History  Problem Relation Age of Onset  . Heart attack Father 80  . Diabetes Father   . Coronary artery disease Father   . COPD Mother   . Hypertension Mother   . Pulmonary fibrosis Brother 13  . Hypertension Sister   . Cancer Daughter      breast  . Rheum arthritis Daughter   . Colon cancer Neg Hx     Social History Social History  Substance Use Topics  . Smoking status: Never Smoker   . Smokeless tobacco: Never Used  . Alcohol Use: No    Review of Systems  Constitutional: Negative for fever. Cardiovascular: Negative for chest pain. Respiratory: Negative for shortness of breath. Gastrointestinal: Negative for abdominal pain, vomiting and diarrhea. Neurological: Negative for headaches, focal weakness or numbness.  10-point ROS otherwise negative.  ____________________________________________   PHYSICAL EXAM:  VITAL SIGNS: ED Triage Vitals  Enc Vitals Group     BP 02/09/16 1112 158/61 mmHg     Pulse Rate 02/09/16 1112 71     Resp 02/09/16 1112 20     Temp 02/09/16 1112 97.6 F (36.4 C)     Temp Source 02/09/16 1112 Oral     SpO2 02/09/16 1112 100 %     Weight 02/09/16 1112 125 lb (56.7 kg)     Height --      Head Cir --      Peak Flow --      Pain Score 02/09/16 1112 5   Constitutional: Alert and oriented. Appears uncomfortable. Eyes: Conjunctivae are normal. PERRL. Normal extraocular movements. ENT   Head: Normocephalic and atraumatic. Tender to palpation on the left occipital groove. Patient states that this does reproduce the pain.   Nose: No congestion/rhinnorhea.   Mouth/Throat: Mucous membranes are moist.   Neck: No stridor. Hematological/Lymphatic/Immunilogical: No cervical lymphadenopathy. Cardiovascular: Normal rate, regular rhythm.  No murmurs, rubs, or gallops. Respiratory: Normal respiratory effort without tachypnea nor retractions. Breath sounds are clear and equal bilaterally. No wheezes/rales/rhonchi. Gastrointestinal: Soft and nontender. No distention. There is no CVA tenderness. Genitourinary: Deferred Musculoskeletal: Normal range of motion in all extremities. No joint effusions.  No lower extremity tenderness nor edema. Neurologic:  Normal speech and language. No  gross focal neurologic deficits are appreciated.  Skin:  Skin is warm, dry and intact. No rash noted. Psychiatric: Mood and affect are normal. Speech and behavior  are normal. Patient exhibits appropriate insight and judgment.  ____________________________________________    LABS (pertinent positives/negatives)  None  ____________________________________________   EKG  None  ____________________________________________    RADIOLOGY  CT head  IMPRESSION: Negative noncontrast head CT.  ____________________________________________   PROCEDURES  Procedure(s) performed: Occipital nerve block, see procedure note(s).  Critical Care performed: No  OCCIPITAL NERVE BLOCK Performed by: Nance Pear Consent: Verbal consent obtained.  Indication: left head pain  Nerve block body site: Left occipital groove  Preparation: Patient was prepped and draped in the usual sterile fashion. Needle gauge: 23 G Location technique: anatomical landmarks  Local anesthetic: 1:4 Lidocaine 1%:Bupivicaine 0.5%  Anesthetic total: 2 ml  Outcome: pain improved Patient tolerance: Patient tolerated the procedure well with no immediate complications.  ____________________________________________   INITIAL IMPRESSION / ASSESSMENT AND PLAN / ED COURSE  Pertinent labs & imaging results that were available during my care of the patient were reviewed by me and considered in my medical decision making (see chart for details).  Patient presented to the emergency department today because of left head pain. On exam patient did have pain with palpation of the left occipital groove. Because of this an occipital nerve block was performed which did provide some relief. Head CT was negative. I think likely patient suffering from occipital neuralgia. Will plan on discharging with pain medication and steroids.   ____________________________________________   FINAL CLINICAL IMPRESSION(S) / ED  DIAGNOSES  Final diagnoses:  Occipital neuralgia of left side     Nance Pear, MD 02/09/16 1329

## 2016-02-09 NOTE — ED Notes (Signed)
Pt arrives to ER via POV c/o left sided sharp pains that begin in head and shoot down neck X 2 days intermittently. Worse at night. Pt has been taking Tylenol/ibuprofen for pain. Pt in NAD, denies weakness. Stroke scale negative. Hx of migraine. Pt alert and oriented X4, active, cooperative, pt in NAD. RR even and unlabored, color WNL.

## 2016-02-09 NOTE — ED Notes (Signed)
Pharmacy called and notified of need of bupivacaine. States they will send it up. MD and patient aware.

## 2016-02-11 ENCOUNTER — Ambulatory Visit (INDEPENDENT_AMBULATORY_CARE_PROVIDER_SITE_OTHER): Payer: Medicare Other | Admitting: Internal Medicine

## 2016-02-11 ENCOUNTER — Ambulatory Visit: Payer: Medicare Other | Admitting: Internal Medicine

## 2016-02-11 ENCOUNTER — Encounter: Payer: Self-pay | Admitting: Internal Medicine

## 2016-02-11 VITALS — BP 118/70 | HR 60 | Temp 97.6°F | Wt 125.0 lb

## 2016-02-11 DIAGNOSIS — R51 Headache: Secondary | ICD-10-CM | POA: Diagnosis not present

## 2016-02-11 DIAGNOSIS — R519 Headache, unspecified: Secondary | ICD-10-CM | POA: Insufficient documentation

## 2016-02-11 NOTE — Assessment & Plan Note (Signed)
ER records reviewed Negative CT of head Seems to have responded to the prednisone--not really the local block Symptoms don't suggest temporal arteritis--especially since heat did help (more consistent with muscle spasm) No known muscle injury ROM of neck is normal  Will observe only Finish out the prednisone Will need reevaluation if pain recurs

## 2016-02-11 NOTE — Progress Notes (Signed)
Pre visit review using our clinic review tool, if applicable. No additional management support is needed unless otherwise documented below in the visit note. 

## 2016-02-11 NOTE — Progress Notes (Signed)
Subjective:    Patient ID: [name], female    DOB: 1941/01/02, 75 y.o.   MRN: [id number]  HPI Here for follow up of ER visit 2 days ago  Started around 4 days ago---with shooting pain behind left ear and radiated down to the top of neck Pain would be so bad she was getting cold sweats Very fleeting but repeated frequently 3 days ago--- tried heat (which helped) or ibuprofen/acetaminophen Eased off some that day but worsened that night Still bad in AM--so went to ER  Had negative CT scan Got occipital nerve block after evaluation--she may have felt a little relief but not much Got home---started on prednisone and pain pill Yesterday--mostly "just an aggravation" but did take 3 oxycodone yesterday None today  No new tasks or apparent injury  Current Outpatient Prescriptions on File Prior to Visit  Medication Sig Dispense Refill  . calcium-vitamin D (OSCAL WITH D 250-125) 250-125 MG-UNIT per tablet Take 1 tablet by mouth daily.      . clorazepate (TRANXENE) 7.5 MG tablet Take 1 tablet (7.5 mg total) by mouth 2 (two) times daily as needed. 60 tablet 0  . docusate sodium (COLACE) 100 MG capsule Take 100 mg by mouth as directed.      . ferrous fumarate (HEMOCYTE - 106 MG FE) 325 (106 FE) MG TABS tablet Take 1 tablet (106 mg of iron total) by mouth daily. 30 each 3  . fish oil-omega-3 fatty acids 1000 MG capsule Take 2 g by mouth daily.      . Multiple Vitamin (MULTIVITAMIN) tablet Take 1 tablet by mouth daily.      . ondansetron (ZOFRAN) 4 MG tablet     . oxyCODONE-acetaminophen (PERCOCET/ROXICET) 5-325 MG per tablet Take 1 tablet by mouth every 6 (six) hours as needed. 20 tablet 0  . Potassium Gluconate 595 MG CAPS Take by mouth every other day.    . pravastatin (PRAVACHOL) 20 MG tablet Take 1 tablet (20 mg total) by mouth daily. 90 tablet 3  . predniSONE (DELTASONE) 20 MG tablet Take 2 tablets (40 mg total) by mouth daily. 10 tablet 0  . propranolol (INDERAL) 40 MG tablet  Take 1 tablet (40 mg total) by mouth 2 (two) times daily. 180 tablet 3  . traMADol (ULTRAM) 50 MG tablet Take 1 tablet (50 mg total) by mouth every 6 (six) hours as needed. 20 tablet 0  . vitamin B-12 (CYANOCOBALAMIN) 1000 MCG tablet Take 1,000 mcg by mouth daily.     No current facility-administered medications on file prior to visit.    Allergies  Allergen Reactions  . Clonazepam     REACTION: Swollen lips, sore mouth  . Penicillins     REACTION: unrecalled reaction    Past Medical History  Diagnosis Date  . Anxiety   . GERD (gastroesophageal reflux disease)     Dr. [name]  . HLD (hyperlipidemia)     Dr. Lia Foyer  . Osteopenia 2005  . OA (osteoarthritis)   . IBS (irritable bowel syndrome)   . Migraine   . Ulcer 2003    bleeding stomach ulcer  . LBBB (left bundle branch block)     Past Surgical History  Procedure Laterality Date  . Cardiac catheterization  1997    okay, mild changes  . Tonsillectomy and adenoidectomy  1953  . Cholecystectomy  1992  . Abdominal hysterectomy  1983  . Appendectomy  1983  . Dexa N/A 2005    borderline osteopenia  .  Colonoscopy  2003  . Upper gastrointestinal endoscopy  2003    Family History  Problem Relation Age of Onset  . Heart attack Father 55  . Diabetes Father   . Coronary artery disease Father   . COPD Mother   . Hypertension Mother   . Pulmonary fibrosis Brother 12  . Hypertension Sister   . Cancer Daughter     breast  . Rheum arthritis Daughter   . Colon cancer Neg Hx     Social History   Social History  . Marital Status: Married    Spouse Name: N/A  . Number of Children: N/A  . Years of Education: N/A   Occupational History  . Retired Database administrator asst.    Social History Main Topics  . Smoking status: Never Smoker   . Smokeless tobacco: Never Used  . Alcohol Use: No  . Drug Use: No  . Sexual Activity: Not on file   Other Topics Concern  . Not on file   Social History Narrative    Has living will   Husband, then daughters, are [place] health care POA   Would accept resuscitation attempts but no prolonged artifical ventilation   No prolonged tube feeds if cognitively impaired    Review of Systems No true headache--but did have soreness around right forehead to occiput (2-3 months ago) No recurrence of migraines No vision loss or changes No focal weakness No aching in shoulders or hips    Objective:   Physical Exam  Constitutional: She appears well-developed and well-nourished. No distress.  HENT:  Mouth/Throat: Oropharynx is clear and moist. No oropharyngeal exudate.  No occipital tenderness now No temporal bruit or tenderness TMs normal  Neurological: She has normal strength. No cranial nerve deficit. Coordination and gait normal.          Assessment & Plan:

## 2016-06-05 ENCOUNTER — Encounter: Payer: Self-pay | Admitting: Internal Medicine

## 2016-06-05 ENCOUNTER — Ambulatory Visit (INDEPENDENT_AMBULATORY_CARE_PROVIDER_SITE_OTHER): Payer: Medicare Other | Admitting: Internal Medicine

## 2016-06-05 VITALS — BP 112/62 | HR 53 | Temp 98.1°F | Ht 62.5 in | Wt 123.0 lb

## 2016-06-05 DIAGNOSIS — K582 Mixed irritable bowel syndrome: Secondary | ICD-10-CM | POA: Diagnosis not present

## 2016-06-05 DIAGNOSIS — E785 Hyperlipidemia, unspecified: Secondary | ICD-10-CM

## 2016-06-05 DIAGNOSIS — Z7189 Other specified counseling: Secondary | ICD-10-CM | POA: Insufficient documentation

## 2016-06-05 DIAGNOSIS — F39 Unspecified mood [affective] disorder: Secondary | ICD-10-CM

## 2016-06-05 DIAGNOSIS — G43109 Migraine with aura, not intractable, without status migrainosus: Secondary | ICD-10-CM

## 2016-06-05 DIAGNOSIS — Z0000 Encounter for general adult medical examination without abnormal findings: Secondary | ICD-10-CM

## 2016-06-05 MED ORDER — CLORAZEPATE DIPOTASSIUM 7.5 MG PO TABS: 7.5000 mg | ORAL_TABLET | Freq: Two times a day (BID) | ORAL | Status: DC | PRN

## 2016-06-05 MED ORDER — PROPRANOLOL HCL 40 MG PO TABS: 40.0000 mg | ORAL_TABLET | Freq: Two times a day (BID) | ORAL | Status: DC

## 2016-06-05 MED ORDER — PRAVASTATIN SODIUM 20 MG PO TABS: 20.0000 mg | ORAL_TABLET | Freq: Every day | ORAL | Status: DC

## 2016-06-05 NOTE — Assessment & Plan Note (Signed)
I have personally reviewed the Medicare Annual Wellness questionnaire and have noted 1. The patient's medical and social history 2. Their use of alcohol, tobacco or illicit drugs 3. Their current medications and supplements 4. The patient's functional ability including ADL's, fall risks, home safety risks and hearing or visual             impairment. 5. Diet and physical activities 6. Evidence for depression or mood disorders  The patients weight, height, BMI and visual acuity have been recorded in the chart I have made referrals, counseling and provided education to the patient based review of the above and I have provided the pt with a written personalized care plan for preventive services.  I have provided you with a copy of your personalized plan for preventive services. Please take the time to review along with your updated medication list.  UTD on imms--needs yearly flu vaccine Done with colons--normal in 2014 mammo due 2018

## 2016-06-05 NOTE — Progress Notes (Signed)
Subjective:    Patient ID: [name], female    DOB: July 29, 1941, 75 y.o.   MRN: [id number]  HPI Here for Medicare wellness visit and follow up of chronic health conditions Reviewed form and advanced directives Reviewed other doctors Vision is okay Hearing is poor--aide on left No tobacco or alcohol Exercises regularly--gym twice a week, active otherwise in garden, etc No falls Occasional mild depression-- several hours, then she pushes past. No anhedonia Independent with instrumental ADLs No apparent cognitive problems  Still with IBS problems Lots of gas, nausea, etc Uses the clorazepate to settle her down with the anxiety from this, etc Still doesn't sleep well-- at most 4 hours at a time  Still on propranolol for migraine prevention Still occasional right forehead pain---doesn't get full blown Has rare percocet for severe pain  No problems with statin No myalgia or other GI problems  Current Outpatient Prescriptions on File Prior to Visit  Medication Sig Dispense Refill  . calcium-vitamin D (OSCAL WITH D 250-125) 250-125 MG-UNIT per tablet Take 1 tablet by mouth daily.      . clorazepate (TRANXENE) 7.5 MG tablet Take 1 tablet (7.5 mg total) by mouth 2 (two) times daily as needed. 60 tablet 0  . docusate sodium (COLACE) 100 MG capsule Take 100 mg by mouth as directed.      . ferrous fumarate (HEMOCYTE - 106 MG FE) 325 (106 FE) MG TABS tablet Take 1 tablet (106 mg of iron total) by mouth daily. 30 each 3  . fish oil-omega-3 fatty acids 1000 MG capsule Take 2 g by mouth daily.      . Multiple Vitamin (MULTIVITAMIN) tablet Take 1 tablet by mouth daily.      . ondansetron (ZOFRAN) 4 MG tablet     . oxyCODONE-acetaminophen (PERCOCET/ROXICET) 5-325 MG per tablet Take 1 tablet by mouth every 6 (six) hours as needed. 20 tablet 0  . Potassium Gluconate 595 MG CAPS Take by mouth every other day.    . pravastatin (PRAVACHOL) 20 MG tablet Take 1 tablet (20 mg total) by mouth  daily. 90 tablet 3  . Probiotic Product (PROBIOTIC PO) Take by mouth daily.    . propranolol (INDERAL) 40 MG tablet Take 1 tablet (40 mg total) by mouth 2 (two) times daily. 180 tablet 3  . triamcinolone ointment (KENALOG) 0.1 % Apply 1 application topically 3 (three) times a week.    . vitamin B-12 (CYANOCOBALAMIN) 1000 MCG tablet Take 1,000 mcg by mouth daily.     No current facility-administered medications on file prior to visit.    Allergies  Allergen Reactions  . Clonazepam     REACTION: Swollen lips, sore mouth  . Penicillins     REACTION: unrecalled reaction    Past Medical History  Diagnosis Date  . Anxiety   . GERD (gastroesophageal reflux disease)     Dr. [name]  . HLD (hyperlipidemia)     Dr. Lia Foyer  . Osteopenia 2005  . OA (osteoarthritis)   . IBS (irritable bowel syndrome)   . Migraine   . Ulcer 2003    bleeding stomach ulcer  . LBBB (left bundle branch block)     Past Surgical History  Procedure Laterality Date  . Cardiac catheterization  1997    okay, mild changes  . Tonsillectomy and adenoidectomy  1953  . Cholecystectomy  1992  . Abdominal hysterectomy  1983  . Appendectomy  1983  . Dexa N/A 2005    borderline osteopenia  .  Colonoscopy  2003  . Upper gastrointestinal endoscopy  2003    Family History  Problem Relation Age of Onset  . Heart attack Father 83  . Diabetes Father   . Coronary artery disease Father   . COPD Mother   . Hypertension Mother   . Pulmonary fibrosis Brother 60  . Hypertension Sister   . Cancer Daughter     breast  . Rheum arthritis Daughter   . Colon cancer Neg Hx     Social History   Social History  . Marital Status: Married    Spouse Name: N/A  . Number of Children: N/A  . Years of Education: N/A   Occupational History  . Retired Database administrator asst.    Social History Main Topics  . Smoking status: Never Smoker   . Smokeless tobacco: Never Used  . Alcohol Use: No  . Drug Use: No  .  Sexual Activity: Not on file   Other Topics Concern  . Not on file   Social History Narrative   Has living will   Husband, then daughters, are [place] health care POA   Would accept resuscitation attempts but no prolonged artifical ventilation   No prolonged tube feeds if cognitively impaired   Review of Systems Alternates constipation and diarrhea. No blood. Some cramping but no other abdominal pain Appetite is okay---weight stable Wears seat belt Teeth okay--goes to dental school at [employer] No recent problems with acid reflux--but does get some problems swallowing (if she doesn't chew properly). No progression. No chest pain or SOB No dizziness Still has some right ankle swelling---chronic now No rash or suspicious spots--sees derm for lichen sclerosis (ointment) No urinary problems Chronic back pain--and some pain in hands    Objective:   Physical Exam  Constitutional: She is oriented to person, place, and time. She appears well-developed and well-nourished. No distress.  HENT:  Mouth/Throat: Oropharynx is clear and moist. No oropharyngeal exudate.  Neck: Normal range of motion. Neck supple. No thyromegaly present.  Cardiovascular: Normal rate, regular rhythm, normal heart sounds and intact distal pulses.  Exam reveals no gallop.   No murmur heard. Pulmonary/Chest: Effort normal and breath sounds normal. No respiratory distress. She has no wheezes. She has no rales.  Abdominal: There is no tenderness.  Musculoskeletal: She exhibits no tenderness.  1+ edema in right ankle only  Lymphadenopathy:    She has no cervical adenopathy.  Neurological: She is alert and oriented to person, place, and time.  President-- "[name], Gracy Racer" [id number] Recall 3/3  Skin: No rash noted. No erythema.  Psychiatric: She has a normal mood and affect. Her behavior is normal.          Assessment & Plan:

## 2016-06-05 NOTE — Progress Notes (Signed)
Pre visit review using our clinic review tool, if applicable. No additional management support is needed unless otherwise documented below in the visit note. 

## 2016-06-05 NOTE — Assessment & Plan Note (Signed)
Alternates diarrhea and constipation

## 2016-06-05 NOTE — Assessment & Plan Note (Signed)
Mostly controlled with the propranolol

## 2016-06-05 NOTE — Assessment & Plan Note (Signed)
See social history 

## 2016-06-05 NOTE — Assessment & Plan Note (Signed)
Mostly just nerves associated with IBS Will refill the clorazepate --mostly for bedtime though

## 2016-06-05 NOTE — Assessment & Plan Note (Signed)
No problems with primary prevention

## 2016-06-06 LAB — COMPREHENSIVE METABOLIC PANEL
ALT: 14 U/L (ref 0–35)
AST: 16 U/L (ref 0–37)
Albumin: 3.6 g/dL (ref 3.5–5.2)
Alkaline Phosphatase: 47 U/L (ref 39–117)
BUN: 15 mg/dL (ref 6–23)
CALCIUM: 9.4 mg/dL (ref 8.4–10.5)
CHLORIDE: 102 meq/L (ref 96–112)
CO2: 29 meq/L (ref 19–32)
Creatinine, Ser: 0.86 mg/dL (ref 0.40–1.20)
GFR: 68.52 mL/min (ref >60.00)
Glucose, Bld: 102 mg/dL — ABNORMAL HIGH (ref 70–99)
POTASSIUM: 4.5 meq/L (ref 3.5–5.1)
Sodium: 138 mEq/L (ref 135–145)
Total Bilirubin: 0.2 mg/dL (ref 0.2–1.2)
Total Protein: 5.9 g/dL — ABNORMAL LOW (ref 6.0–8.3)

## 2016-06-06 LAB — CBC WITH DIFFERENTIAL/PLATELET
BASOS PCT: 0.4 % (ref 0.0–3.0)
Basophils Absolute: 0 10*3/uL (ref 0.0–0.1)
EOS PCT: 3.6 % (ref 0.0–5.0)
Eosinophils Absolute: 0.3 10*3/uL (ref 0.0–0.7)
HEMATOCRIT: 39.1 % (ref 36.0–46.0)
HEMOGLOBIN: 12.9 g/dL (ref 12.0–15.0)
Lymphocytes Relative: 25.3 % (ref 12.0–46.0)
Lymphs Abs: 2 10*3/uL (ref 0.7–4.0)
MCHC: 33 g/dL (ref 30.0–36.0)
MCV: 90.2 fl (ref 78.0–100.0)
MONO ABS: 0.6 10*3/uL (ref 0.1–1.0)
Monocytes Relative: 7.1 % (ref 3.0–12.0)
Neutro Abs: 5 10*3/uL (ref 1.4–7.7)
Neutrophils Relative %: 63.6 % (ref 43.0–77.0)
Platelets: 300 10*3/uL (ref 150.0–400.0)
RBC: 4.33 Mil/uL (ref 3.87–5.11)
RDW: 13.7 % (ref 11.5–15.5)
WBC: 7.9 10*3/uL (ref 4.0–10.5)

## 2016-06-06 LAB — LIPID PANEL
CHOL/HDL RATIO: 4
Cholesterol: 183 mg/dL (ref 0–200)
HDL: 47.8 mg/dL (ref >39.00)
LDL CALC: 98 mg/dL (ref 0–99)
NONHDL: 135.29
Triglycerides: 186 mg/dL — ABNORMAL HIGH (ref 0.0–149.0)
VLDL: 37.2 mg/dL (ref 0.0–40.0)

## 2016-06-06 LAB — T4, FREE: FREE T4: 1.13 ng/dL (ref 0.60–1.60)

## 2016-06-12 ENCOUNTER — Other Ambulatory Visit: Payer: Self-pay | Admitting: Internal Medicine

## 2016-06-20 ENCOUNTER — Other Ambulatory Visit: Payer: Self-pay | Admitting: Internal Medicine

## 2016-06-21 ENCOUNTER — Other Ambulatory Visit: Payer: Self-pay | Admitting: Internal Medicine

## 2016-06-23 MED ORDER — CLORAZEPATE DIPOTASSIUM 7.5 MG PO TABS: 7.5000 mg | ORAL_TABLET | Freq: Two times a day (BID) | ORAL | Status: DC | PRN

## 2016-06-23 NOTE — Addendum Note (Signed)
Addended by: Pilar Grammes on: 06/23/2016 09:58 AM   Modules accepted: Orders

## 2016-06-23 NOTE — Telephone Encounter (Signed)
Left refill on voice mail at pharmacy  

## 2016-07-30 DIAGNOSIS — L299 Pruritus, unspecified: Secondary | ICD-10-CM | POA: Diagnosis not present

## 2016-07-30 DIAGNOSIS — L900 Lichen sclerosus et atrophicus: Secondary | ICD-10-CM | POA: Diagnosis not present

## 2016-07-30 DIAGNOSIS — T07 Unspecified multiple injuries: Secondary | ICD-10-CM | POA: Diagnosis not present

## 2016-08-05 DIAGNOSIS — M79674 Pain in right toe(s): Secondary | ICD-10-CM | POA: Diagnosis not present

## 2016-08-05 DIAGNOSIS — M79675 Pain in left toe(s): Secondary | ICD-10-CM | POA: Diagnosis not present

## 2016-08-05 DIAGNOSIS — B351 Tinea unguium: Secondary | ICD-10-CM | POA: Diagnosis not present

## 2016-09-17 ENCOUNTER — Other Ambulatory Visit (HOSPITAL_COMMUNITY): Payer: Self-pay | Admitting: Obstetrics & Gynecology

## 2016-09-17 ENCOUNTER — Other Ambulatory Visit: Payer: Self-pay | Admitting: Internal Medicine

## 2016-09-17 DIAGNOSIS — Z1231 Encounter for screening mammogram for malignant neoplasm of breast: Secondary | ICD-10-CM

## 2016-09-24 ENCOUNTER — Ambulatory Visit (INDEPENDENT_AMBULATORY_CARE_PROVIDER_SITE_OTHER): Payer: Medicare Other

## 2016-09-24 DIAGNOSIS — Z23 Encounter for immunization: Secondary | ICD-10-CM | POA: Diagnosis not present

## 2016-09-25 ENCOUNTER — Ambulatory Visit
Admission: RE | Admit: 2016-09-25 | Discharge: 2016-09-25 | Disposition: A | Discharge status: Home / Self Care | Payer: Medicare Other | Source: Ambulatory Visit | Attending: Internal Medicine | Admitting: Internal Medicine

## 2016-09-25 DIAGNOSIS — Z1231 Encounter for screening mammogram for malignant neoplasm of breast: Secondary | ICD-10-CM

## 2016-09-25 IMAGING — MG DIGITAL SCREENING BILATERAL MAMMOGRAM WITH CAD
4 series · 4 of 4 positions shown · non-contrast
Comparison: Previous exam(s).

CLINICAL DATA: Screening.

EXAM:
DIGITAL SCREENING BILATERAL MAMMOGRAM WITH CAD

[L CC]
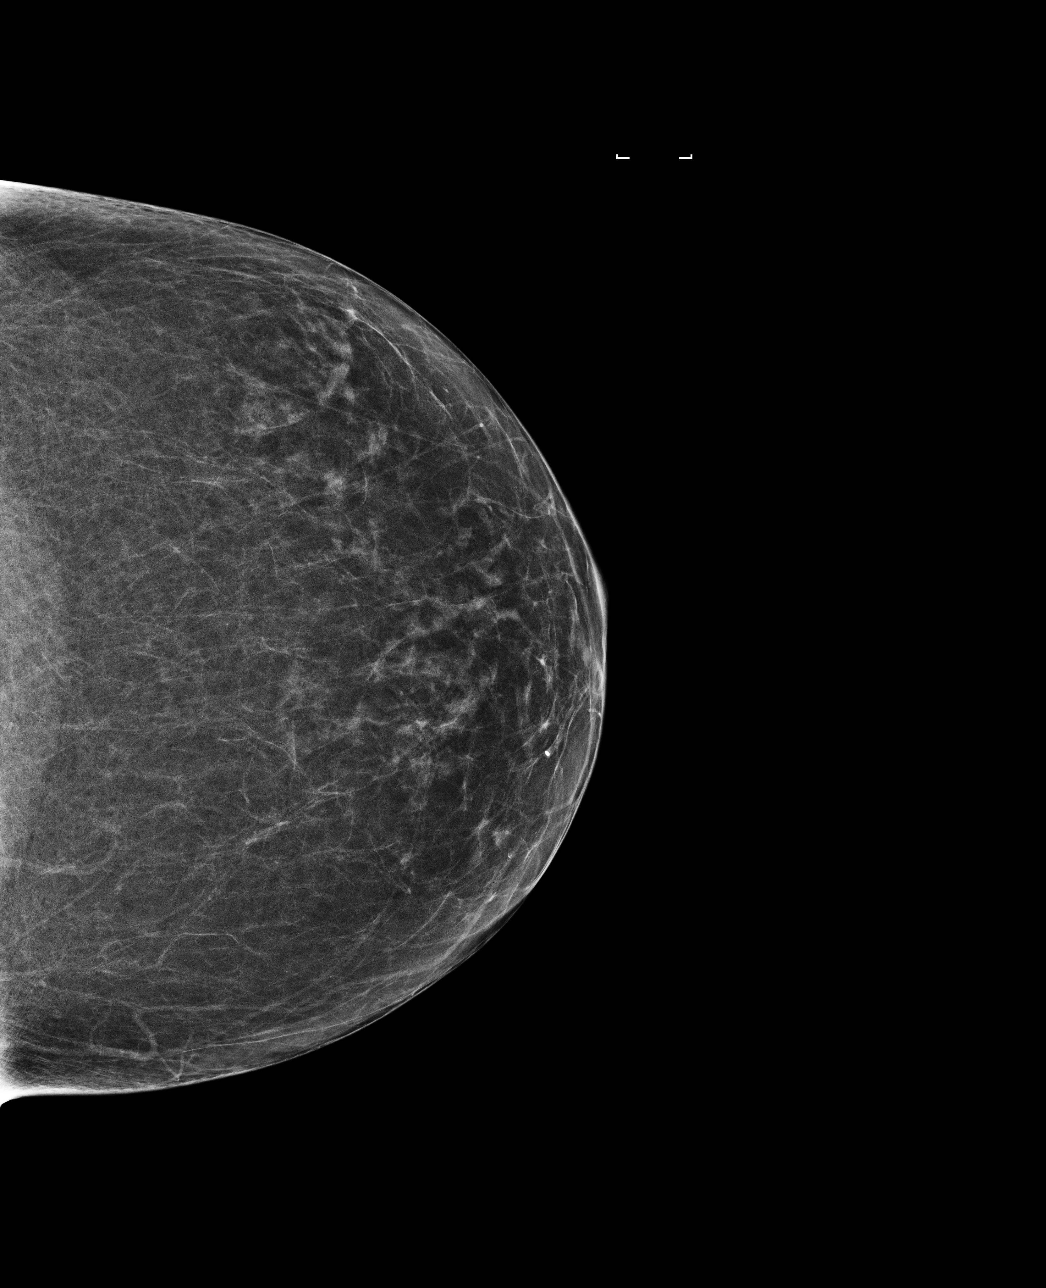

[R MLO]
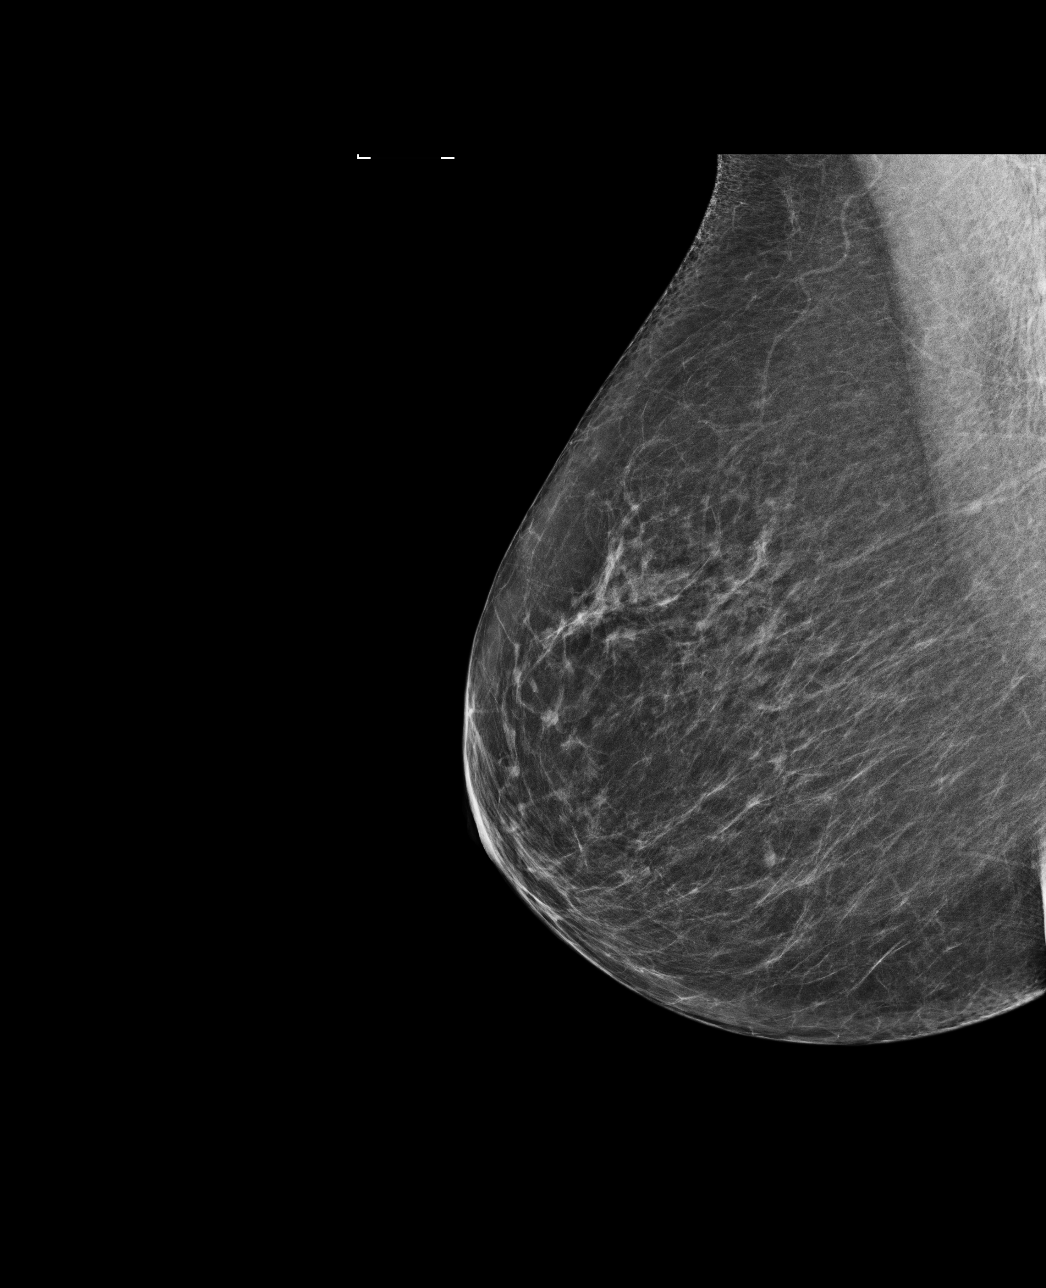

[L MLO]
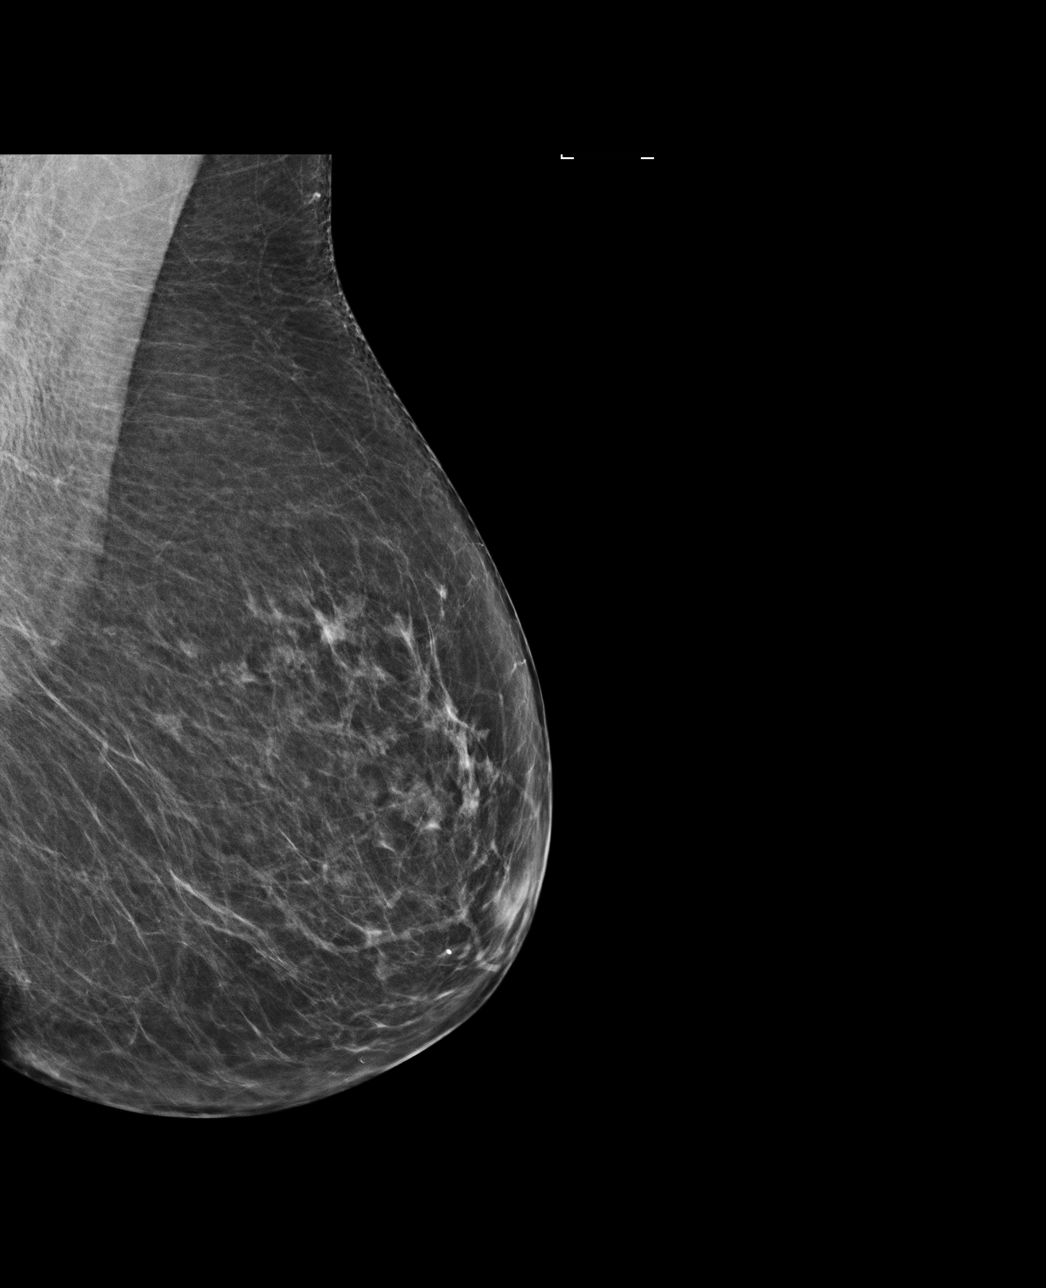

[R CC]
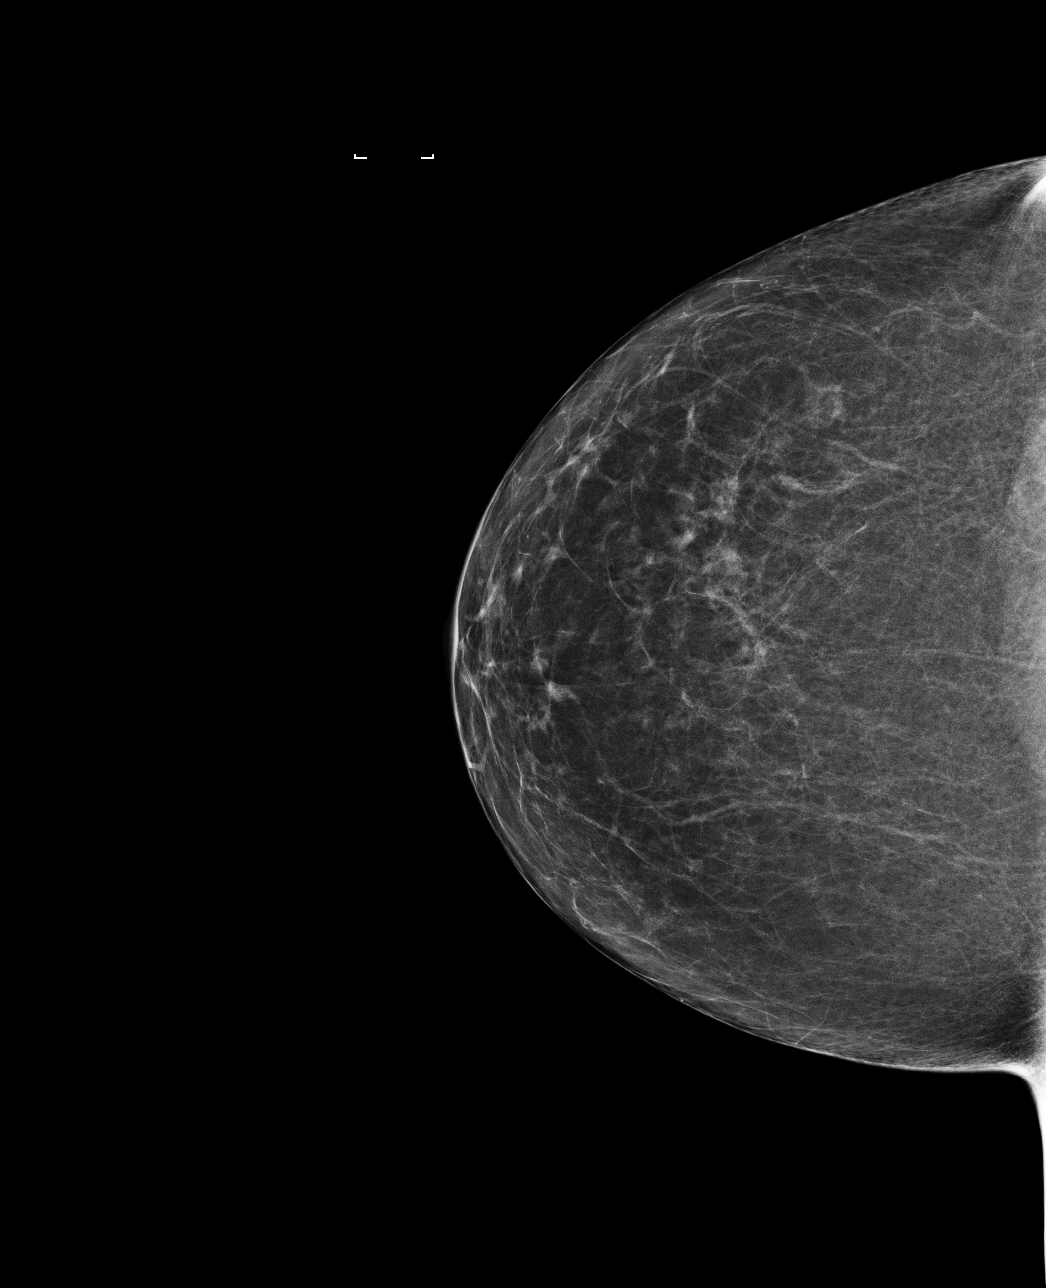

[4 of 4 positions shown; findings below may reference images not displayed]

ACR Breast Density Category b: There are scattered areas of
fibroglandular density.
FINDINGS: There are no findings suspicious for malignancy. Images were
processed with CAD.
IMPRESSION: No mammographic evidence of malignancy. A result letter of this
screening mammogram will be mailed directly to the patient.

RECOMMENDATION:
Screening mammogram in one year. (Code:[US])

BI-RADS CATEGORY  1: Negative.

## 2016-09-29 DIAGNOSIS — H2513 Age-related nuclear cataract, bilateral: Secondary | ICD-10-CM | POA: Diagnosis not present

## 2016-12-02 ENCOUNTER — Ambulatory Visit (INDEPENDENT_AMBULATORY_CARE_PROVIDER_SITE_OTHER): Payer: Medicare Other | Admitting: Family Medicine

## 2016-12-02 ENCOUNTER — Encounter: Payer: Self-pay | Admitting: Family Medicine

## 2016-12-02 VITALS — BP 118/64 | HR 62 | Temp 97.5°F | Ht 62.5 in | Wt 120.5 lb

## 2016-12-02 DIAGNOSIS — R300 Dysuria: Secondary | ICD-10-CM | POA: Diagnosis not present

## 2016-12-02 DIAGNOSIS — N3000 Acute cystitis without hematuria: Secondary | ICD-10-CM | POA: Diagnosis not present

## 2016-12-02 LAB — POC URINALSYSI DIPSTICK (AUTOMATED)
Bilirubin, UA: NEGATIVE
Blood, UA: 25
Glucose, UA: NEGATIVE
KETONES UA: NEGATIVE
Nitrite, UA: NEGATIVE
PH UA: 7.5
SPEC GRAV UA: 1.01
UROBILINOGEN UA: 0.2

## 2016-12-02 MED ORDER — SULFAMETHOXAZOLE-TRIMETHOPRIM 800-160 MG PO TABS: 1.0000 | ORAL_TABLET | Freq: Two times a day (BID) | ORAL | 0 refills | Status: DC

## 2016-12-02 NOTE — Assessment & Plan Note (Signed)
Uncomplicated Cover with septra ds bid for 5 d Disc inc water/dec other beverages Update if worse or no improvement in several days Pending urine cx result as well

## 2016-12-02 NOTE — Patient Instructions (Addendum)
I think you have a uti  Back off beverages other than water as much as you can- they can worsen bladder discomfort Keep drinking water  We will let you know when your urine culture result returns   Update [place] if your symptoms worsen

## 2016-12-02 NOTE — Progress Notes (Signed)
Subjective:    Patient ID: [name], female    DOB: [birth year], 75 y.o.   MRN: [id number]  HPI Here for urinary symptoms  Has not had a uti in a long time    Burns to urinate Pain in bladder with pressure  Feel like she does not empty all the way - frequency and urgency  No blood in urine  Some odor in urine - just one time   No vaginal d/c or changes Has lichen sclerosis   Drinking a lot of water    Results for orders placed or performed in visit on 12/02/16  POCT Urinalysis Dipstick (Automated)  Result Value Ref Range   Color, UA Light Yellow    Clarity, UA Clear    Glucose, UA Negative    Bilirubin, UA Negative    Ketones, UA Negative    Spec Grav, UA 1.010    Blood, UA 25 Ery/uL    pH, UA 7.5    Protein, UA Trace    Urobilinogen, UA 0.2    Nitrite, UA Negative    Leukocytes, UA small (1+) (A) Negative     She also drinks tea and some soda or coffee    Patient Active Problem List   Diagnosis Date Noted  . Acute cystitis 12/02/2016  . Advance directive discussed with patient 06/05/2016  . Occipital pain 02/11/2016  . Vitamin B12 deficiency 10/15/2012  . Classic migraine with aura 04/16/2012  . Routine general medical examination at a health care facility 04/16/2011  . ACTINIC KERATOSIS 04/02/2010  . LICHEN PLANUS [id number]/[id number]  . OSTEOARTHRITIS 07/20/2007  . Hyperlipemia 07/08/2007  . Episodic mood disorder ([place]) 07/08/2007  . GERD 07/08/2007  . IBS 07/08/2007  . OSTEOPENIA 07/08/2007   Past Medical History:  Diagnosis Date  . Anxiety   . GERD (gastroesophageal reflux disease)    Dr. [name]  . HLD (hyperlipidemia)    Dr. Lia Foyer  . IBS (irritable bowel syndrome)   . LBBB (left bundle branch block)   . Migraine   . OA (osteoarthritis)   . Osteopenia 2005  . Ulcer ([place]) 2003   bleeding stomach ulcer   Past Surgical History:  Procedure Laterality Date  . ABDOMINAL HYSTERECTOMY  1983  . APPENDECTOMY  1983  . CARDIAC CATHETERIZATION   1997   okay, mild changes  . CHOLECYSTECTOMY  1992  . COLONOSCOPY  2003  . DEXA N/A 2005   borderline osteopenia  . TONSILLECTOMY AND ADENOIDECTOMY  1953  . UPPER GASTROINTESTINAL ENDOSCOPY  2003   Social History  Substance Use Topics  . Smoking status: Never Smoker  . Smokeless tobacco: Never Used  . Alcohol use No   Family History  Problem Relation Age of Onset  . Heart attack Father 40  . Diabetes Father   . Coronary artery disease Father   . COPD Mother   . Hypertension Mother   . Pulmonary fibrosis Brother 55  . Hypertension Sister   . Cancer Daughter     breast  . Rheum arthritis Daughter   . Colon cancer Neg Hx    Allergies  Allergen Reactions  . Clonazepam     REACTION: Swollen lips, sore mouth  . Penicillins     REACTION: unrecalled reaction   Current Outpatient Prescriptions on File Prior to Visit  Medication Sig Dispense Refill  . calcium-vitamin D (OSCAL WITH D 250-125) 250-125 MG-UNIT per tablet Take 1 tablet by mouth daily.      . clorazepate (TRANXENE) 7.5  MG tablet Take 1 tablet (7.5 mg total) by mouth 2 (two) times daily as needed. 60 tablet 0  . docusate sodium (COLACE) 100 MG capsule Take 100 mg by mouth as directed.      . ferrous fumarate (HEMOCYTE - 106 MG FE) 325 (106 FE) MG TABS tablet Take 1 tablet (106 mg of iron total) by mouth daily. 30 each 3  . fish oil-omega-3 fatty acids 1000 MG capsule Take 2 g by mouth daily.      . Multiple Vitamin (MULTIVITAMIN) tablet Take 1 tablet by mouth daily.      Marland Kitchen oxyCODONE-acetaminophen (PERCOCET/ROXICET) 5-325 MG per tablet Take 1 tablet by mouth every 6 (six) hours as needed. 20 tablet 0  . Potassium Gluconate 595 MG CAPS Take by mouth every other day.    . pravastatin (PRAVACHOL) 20 MG tablet Take 1 tablet (20 mg total) by mouth daily. 90 tablet 3  . Probiotic Product (PROBIOTIC PO) Take by mouth daily.    . propranolol (INDERAL) 40 MG tablet Take 1 tablet (40 mg total) by mouth 2 (two) times daily. 180  tablet 3  . triamcinolone ointment (KENALOG) 0.1 % Apply 1 application topically 3 (three) times a week.    . vitamin B-12 (CYANOCOBALAMIN) 1000 MCG tablet Take 1,000 mcg by mouth daily.     No current facility-administered medications on file prior to visit.     Review of Systems  Constitutional: Positive for fatigue. Negative for activity change, appetite change and fever.  HENT: Negative for congestion and sore throat.   Eyes: Negative for itching and visual disturbance.  Respiratory: Negative for cough and shortness of breath.   Cardiovascular: Negative for leg swelling.  Gastrointestinal: Negative for abdominal distention, abdominal pain, constipation, diarrhea and nausea.  Endocrine: Negative for cold intolerance and polydipsia.  Genitourinary: Positive for dysuria, frequency and urgency. Negative for difficulty urinating, flank pain and hematuria.       Pos for hx of lichen sclerosis  Musculoskeletal: Negative for myalgias.  Skin: Negative for rash.  Allergic/Immunologic: Negative for immunocompromised state.  Neurological: Negative for dizziness and weakness.  Hematological: Negative for adenopathy.       Objective:   Physical Exam  Constitutional: She appears well-developed and well-nourished. No distress.  Well appearing elderly female   HENT:  Head: Normocephalic and atraumatic.  Eyes: Conjunctivae and EOM are normal. Pupils are equal, round, and reactive to light.  Neck: Normal range of motion. Neck supple.  Cardiovascular: Normal rate, regular rhythm and normal heart sounds.   Pulmonary/Chest: Effort normal and breath sounds normal.  Abdominal: Soft. Bowel sounds are normal. She exhibits no distension. There is tenderness. There is no rebound.  No cva tenderness  Mild suprapubic tenderness  Musculoskeletal: She exhibits no edema.  Lymphadenopathy:    She has no cervical adenopathy.  Neurological: She is alert.  Skin: No rash noted.  Psychiatric: She has a  normal mood and affect.          Assessment & Plan:   Problem List Items Addressed This Visit      Genitourinary   Acute cystitis - Primary    Uncomplicated Cover with septra ds bid for 5 d Disc inc water/dec other beverages Update if worse or no improvement in several days Pending urine cx result as well       Relevant Orders   Urine culture    Other Visit Diagnoses    Dysuria       Relevant Orders  POCT Urinalysis Dipstick (Automated) (Completed)

## 2016-12-02 NOTE — Progress Notes (Signed)
Pre visit review using our clinic review tool, if applicable. No additional management support is needed unless otherwise documented below in the visit note. 

## 2016-12-04 LAB — URINE CULTURE

## 2017-02-02 DIAGNOSIS — L900 Lichen sclerosus et atrophicus: Secondary | ICD-10-CM | POA: Diagnosis not present

## 2017-02-02 DIAGNOSIS — T07XXXD Unspecified multiple injuries, subsequent encounter: Secondary | ICD-10-CM | POA: Diagnosis not present

## 2017-05-27 ENCOUNTER — Encounter: Payer: Self-pay | Admitting: Gastroenterology

## 2017-05-27 ENCOUNTER — Ambulatory Visit (INDEPENDENT_AMBULATORY_CARE_PROVIDER_SITE_OTHER): Payer: Medicare Other | Admitting: Gastroenterology

## 2017-05-27 ENCOUNTER — Other Ambulatory Visit (INDEPENDENT_AMBULATORY_CARE_PROVIDER_SITE_OTHER): Payer: Medicare Other

## 2017-05-27 VITALS — BP 110/62 | HR 64 | Ht 62.5 in | Wt 120.4 lb

## 2017-05-27 DIAGNOSIS — R1084 Generalized abdominal pain: Secondary | ICD-10-CM

## 2017-05-27 DIAGNOSIS — G43A0 Cyclical vomiting, not intractable: Secondary | ICD-10-CM

## 2017-05-27 DIAGNOSIS — R194 Change in bowel habit: Secondary | ICD-10-CM

## 2017-05-27 DIAGNOSIS — R1115 Cyclical vomiting syndrome unrelated to migraine: Secondary | ICD-10-CM

## 2017-05-27 LAB — CREATININE, SERUM: Creatinine, Ser: 0.77 mg/dL (ref 0.40–1.20)

## 2017-05-27 LAB — BUN: BUN: 10 mg/dL (ref 6–23)

## 2017-05-27 MED ORDER — TRAMADOL HCL 50 MG PO TABS: 50.0000 mg | ORAL_TABLET | Freq: Four times a day (QID) | ORAL | 0 refills | Status: DC | PRN

## 2017-05-27 NOTE — Progress Notes (Signed)
[facility] Consult Note:  History: [name] 05/27/2017  Primary care physician: Venia Carbon, MD  Reason for consult/chief complaint: Abdominal Pain (Severe pain generalized area, follow by diarrhea, episode lasted 1 week); Diarrhea (alternating with constipation); and Nausea (with vomiting accompanied pain episode)   Subjective  HPI:  This is a 76 year old woman self-referred for years of abdominal pain diarrhea and intermittent vomiting. She reports that for at least the last 5 years she has had tendencies to alternating constipation and loose nonbloody stool. Of note, she had a normal screening colonoscopy with Dr. Nichola Sizer in June 2014, but had not been referred to see Dr. [name] for an office visit or any particular workup.Marland Kitchen Every few months [name] will have a severe episode of intense periumbilical abdominal pain that might radiate to other areas of the abdomen with worsened diarrhea and vomiting. The most recent episode was about a week ago. With that, she also had some hives on the stents the surface of both arms that are slowly subsiding. She plans to see primary care about this in the coming days. She had not previously had a rash with any other episodes, and there is been no facial or lips swelling with these episodes she describes. She denies rectal bleeding, her appetite is generally good with no dysphagia or weight loss. Reviewing the primary care note of a year ago, the symptoms have been labeled IBS, and she reportedly had some improvement at times taking clorazepate. She says that medicine would "relax me, including my stomach".  ROS:  Review of Systems  Constitutional: Negative for appetite change and unexpected weight change.  HENT: Negative for mouth sores and voice change.   Eyes: Negative for pain and redness.  Respiratory: Negative for cough and shortness of breath.   Cardiovascular: Negative for chest pain and palpitations.    Genitourinary: Negative for dysuria and hematuria.  Musculoskeletal: Negative for arthralgias and myalgias.  Skin: Negative for pallor and rash.  Neurological: Negative for weakness and headaches.  Hematological: Negative for adenopathy.     Past Medical History: Past Medical History:  Diagnosis Date  . Anxiety   . GERD (gastroesophageal reflux disease)    Dr. [name]  . HLD (hyperlipidemia)    Dr. Lia Foyer  . IBS (irritable bowel syndrome)   . LBBB (left bundle branch block)   . Migraine   . OA (osteoarthritis)   . Osteopenia 2005  . Ulcer 2003   bleeding stomach ulcer     Past Surgical History: Past Surgical History:  Procedure Laterality Date  . ABDOMINAL HYSTERECTOMY  1983  . APPENDECTOMY  1983  . CARDIAC CATHETERIZATION  1997   okay, mild changes  . CHOLECYSTECTOMY  1992  . COLONOSCOPY  2003  . DEXA N/A 2005   borderline osteopenia  . TONSILLECTOMY AND ADENOIDECTOMY  1953  . UPPER GASTROINTESTINAL ENDOSCOPY  2003     Family History: Family History  Problem Relation Age of Onset  . Heart attack Father 33  . Diabetes Father   . Coronary artery disease Father   . COPD Mother   . Hypertension Mother   . Pulmonary fibrosis Brother 89  . Hypertension Sister   . Cancer Daughter        breast  . Rheum arthritis Daughter   . Colon cancer Neg Hx     Social History: Social History   Social History  . Marital status: Married    Spouse name: N/A  . Number of children: N/A  .  Years of education: N/A   Occupational History  . Retired Database administrator asst.    Social History Main Topics  . Smoking status: Never Smoker  . Smokeless tobacco: Never Used  . Alcohol use No  . Drug use: No  . Sexual activity: Not Asked   Other Topics Concern  . None   Social History Narrative   Has living will   Husband, then daughters, are [place] health care POA   Would accept resuscitation attempts but no prolonged artifical ventilation   No prolonged  tube feeds if cognitively impaired    Allergies: Allergies  Allergen Reactions  . Clonazepam     REACTION: Swollen lips, sore mouth  . Penicillins     REACTION: unrecalled reaction    Outpatient Meds: Current Outpatient Prescriptions  Medication Sig Dispense Refill  . calcium-vitamin D (OSCAL WITH D 250-125) 250-125 MG-UNIT per tablet Take 1 tablet by mouth daily.      Marland Kitchen docusate sodium (COLACE) 100 MG capsule Take 100 mg by mouth as directed.      . ferrous fumarate (HEMOCYTE - 106 MG FE) 325 (106 FE) MG TABS tablet Take 1 tablet (106 mg of iron total) by mouth daily. 30 each 3  . fish oil-omega-3 fatty acids 1000 MG capsule Take 2 g by mouth daily.      . Multiple Vitamin (MULTIVITAMIN) tablet Take 1 tablet by mouth daily.      Marland Kitchen oxyCODONE-acetaminophen (PERCOCET/ROXICET) 5-325 MG per tablet Take 1 tablet by mouth every 6 (six) hours as needed. 20 tablet 0  . Potassium Gluconate 595 MG CAPS Take by mouth every other day.    . pravastatin (PRAVACHOL) 20 MG tablet Take 1 tablet (20 mg total) by mouth daily. 90 tablet 3  . Probiotic Product (PROBIOTIC PO) Take by mouth daily.    . propranolol (INDERAL) 40 MG tablet Take 1 tablet (40 mg total) by mouth 2 (two) times daily. 180 tablet 3  . triamcinolone ointment (KENALOG) 0.1 % Apply 1 application topically 3 (three) times a week.    . vitamin B-12 (CYANOCOBALAMIN) 1000 MCG tablet Take 1,000 mcg by mouth daily.    . traMADol (ULTRAM) 50 MG tablet Take 1 tablet (50 mg total) by mouth every 6 (six) hours as needed. 12 tablet 0   No current facility-administered medications for this visit.       ___________________________________________________________________ Objective   Exam:  BP 110/62   Pulse 64   Ht 5' 2.5" (1.588 m)   Wt 120 lb 6.4 oz (54.6 kg)   BMI 21.67 kg/m    General: this is a(n) Pleasant and well-appearing elderly woman   Eyes: sclera anicteric, no redness  ENT: oral mucosa moist without lesions, no  cervical or supraclavicular lymphadenopathy, good dentition  CV: RRR without murmur, S1/S2, no JVD, mild right ankle and lower calf edema (chronic, according to the PCP note of June 2017)  Resp: clear to auscultation bilaterally, normal RR and effort noted  GI: soft, + moderate RLQ  Tenderness, mild L-sided TTP, with active bowel sounds. No guarding or palpable organomegaly noted.  Skin; warm and dry, no rash or jaundice noted  Neuro: awake, alert and oriented x 3. Normal gross motor function and fluent speech  Labs:  CBC Latest Ref Rng & Units 06/05/2016 08/14/2015 06/27/2015  WBC 4.0 - 10.5 K/uL 7.9 6.3 7.7  Hemoglobin 12.0 - 15.0 g/dL 12.9 12.2 11.1(L)  Hematocrit 36.0 - 46.0 % 39.1 37.5 34.2(L)  Platelets 150.0 -  400.0 K/uL 300.0 253.0 298.0   CMP Latest Ref Rng & Units 06/05/2016 06/04/2015 05/02/2014  Glucose 70 - 99 mg/dL 102(H) 106(H) 90  BUN 6 - 23 mg/dL 15 12 11   Creatinine 0.40 - 1.20 mg/dL 0.86 0.79 0.8  Sodium 135 - 145 mEq/L 138 137 139  Potassium 3.5 - 5.1 mEq/L 4.5 4.8 4.7  Chloride 96 - 112 mEq/L 102 103 103  CO2 19 - 32 mEq/L 29 28 27   Calcium 8.4 - 10.5 mg/dL 9.4 9.2 8.9  Total Protein 6.0 - 8.3 g/dL 5.9(L) 6.0 5.8(L)  Total Bilirubin 0.2 - 1.2 mg/dL 0.2 0.2 0.3  Alkaline Phos 39 - 117 U/L 47 50 48  AST 0 - 37 U/L 16 18 16   ALT 0 - 35 U/L 14 11 15      Radiologic Studies:  No abdominal imaging found in her chart  Assessment: Encounter Diagnoses  Name Primary?  . Generalized abdominal pain Yes  . Altered bowel habits   . 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 However, these severe episodes raise the suspicion of an underlying structural or inflammatory condition in the small bowel. She has moderate RLQ tenderness today for unclear reasons. She says that after one of these "episodes, it feels like her whole intestine is sensitive for at least a week afterwards. She  reports that the pain during these acute episodes is quite intense and she nearly went to the ED with a recent one. She has Percocet listed as an active medicine for her, but says that was given to her many years ago and she wasn't even sure if it was for this condition. At any rate, she no longer has any left, and requested some pain medication in case she has another episode soon. Plan:  Tramadol 50 mg every 6 hours as needed, 12 tablets given with no refills CT scan abdomen and pelvis with contrast. If unremarkable, we can discuss available medicines for functional bowel disorder. Written dietary advice given for gas and bloating  Thank you for the courtesy of this consult.  Please call me with any questions or concerns.  Nelida Meuse III  CC: Venia Carbon, MD

## 2017-05-27 NOTE — Patient Instructions (Addendum)
If you are age 75 or older, your body mass index should be between 23-30. Your Body mass index is 21.67 kg/m. If this is out of the aforementioned range listed, please consider follow up with your Primary Care Provider.  If you are age 75 or younger, your body mass index should be between 19-25. Your Body mass index is 21.67 kg/m. If this is out of the aformentioned range listed, please consider follow up with your Primary Care Provider.    You have been scheduled for a CT scan of the abdomen and pelvis at [facility] ([address]---this is in the same building as Press photographer).   You are scheduled on 06-03-2017 at [facility] should arrive 15 minutes prior to your appointment time for registration. Please follow the written instructions below on the day of your exam:  WARNING: IF YOU ARE ALLERGIC TO IODINE/X-RAY DYE, PLEASE NOTIFY RADIOLOGY IMMEDIATELY AT [id number]! YOU WILL BE GIVEN A 13 HOUR PREMEDICATION PREP.  1) Do not eat or drink anything after 7am (4 hours prior to your test) 2) You have been given 2 bottles of oral contrast to drink. The solution may taste               better if refrigerated, but do NOT add ice or any other liquid to this solution. Shake             well before drinking.    Drink 1 bottle of contrast @ 9am (2 hours prior to your exam)  Drink 1 bottle of contrast @ 10am (1 hour prior to your exam)  You may take any medications as prescribed with a small amount of water except for the following: Metformin, Glucophage, Glucovance, Avandamet, Riomet, Fortamet, Actoplus Met, Janumet, Glumetza or Metaglip. The above medications must be held the day of the exam AND 48 hours after the exam.  The purpose of you drinking the oral contrast is to aid in the visualization of your intestinal tract. The contrast solution may cause some diarrhea. Before your exam is started, you will be given a small amount of fluid to drink. Depending on your individual set of  symptoms, you may also receive an intravenous injection of x-ray contrast/dye. Plan on being at [facility] for 30 minutes or longer, depending on the type of exam you are having performed.  This test typically takes 30-45 minutes to complete.  If you have any questions regarding your exam or if you need to reschedule, you may call the CT department at [phone] between the hours of 8:00 am and 5:00 pm, Monday-Friday.  Your physician has requested that you go to the basement for the following lab work before leaving today: bun/creat  _________________________________________________________________________________________  Food Guidelines for a sensitive stomach  Many people have difficulty digesting certain foods, causing a variety of distressing and embarrassing symptoms such as abdominal pain, bloating and gas.  These foods may need to be avoided or consumed in small amounts.  Here are some tips that might be helpful for you.  1.   Lactose intolerance is the difficulty or complete inability to digest lactose, the natural sugar in milk and anything made from milk.  This condition is harmless, common, and can begin any time during life.  Some people can digest a modest amount of lactose while others cannot tolerate any.  Also, not all dairy products contain equal amounts of lactose.  For example, hard cheeses such as parmesan have less lactose than soft  cheeses such as cheddar.  Yogurt has less lactose than milk or cheese.  Many packaged foods (even many brands of bread) have milk, so read ingredient lists carefully.  It is difficult to test for lactose intolerance, so just try avoiding lactose as much as possible for a week and see what happens with your symptoms.  If you seem to be lactose intolerant, the best plan is to avoid it (but make sure you get calcium from another source).  The next best thing is to use lactase enzyme supplements, available over the counter everywhere.  Just know  that many lactose intolerant people need to take several tablets with each serving of dairy to avoid symptoms.  Lastly, a lot of restaurant food is made with milk or butter.  Many are things you might not suspect, such as mashed potatoes, rice and pasta (cooked with butter) and "grilled" items.  If you are lactose intolerant, it never hurts to ask your server what has milk or butter.  2.   Fiber is an important part of your diet, but not all fiber is well-tolerated.  Insoluble fiber such as bran is often consumed by normal gut bacteria and converted into gas.  Soluble fiber such as oats, squash, carrots and green beans are typically tolerated better.  3.   Some types of carbohydrates can be poorly digested.  Examples include: fructose (apples, cherries, pears, raisins and other dried fruits), fructans (onions, zucchini, large amounts of wheat), sorbitol/mannitol/xylitol and sucralose/Splenda (common artificial sweeteners), and raffinose (lentils, broccoli, cabbage, asparagus, brussel sprouts, many types of beans).  Do a Development worker, community for [employer] and you will find helpful information. Beano, a dietary supplement, will often help with raffinose-containing foods.  As with lactase tablets, you may need several per serving.  4.   Whenever possible, avoid processed food&meats and chemical additives.  High fructose corn syrup, a common sweetener, may be difficult to digest.  Eggs and soy (comes from the soybean, and added to many foods now) are the other most common bloating/gassy foods.  - Dr. [facility]

## 2017-06-03 ENCOUNTER — Ambulatory Visit (INDEPENDENT_AMBULATORY_CARE_PROVIDER_SITE_OTHER)
Admission: RE | Admit: 2017-06-03 | Discharge: 2017-06-03 | Disposition: A | Discharge status: Home / Self Care | Payer: Medicare Other | Source: Ambulatory Visit | Attending: Gastroenterology | Admitting: Gastroenterology

## 2017-06-03 ENCOUNTER — Telehealth: Payer: Self-pay | Admitting: Gastroenterology

## 2017-06-03 DIAGNOSIS — R1030 Lower abdominal pain, unspecified: Secondary | ICD-10-CM | POA: Diagnosis not present

## 2017-06-03 DIAGNOSIS — R1084 Generalized abdominal pain: Secondary | ICD-10-CM

## 2017-06-03 DIAGNOSIS — R1115 Cyclical vomiting syndrome unrelated to migraine: Secondary | ICD-10-CM

## 2017-06-03 DIAGNOSIS — R194 Change in bowel habit: Secondary | ICD-10-CM

## 2017-06-03 DIAGNOSIS — G43A0 Cyclical vomiting, not intractable: Secondary | ICD-10-CM

## 2017-06-03 IMAGING — CT CT ABD-PELV W/ CM
2 of 5 series · 14 of 46 positions shown, 16 images · IV contrast (iopamidol)
Comparison: No priors.

CLINICAL DATA: 76-year-old female with generalized lower abdominal
pain, excessive gas, nausea, vomiting, diarrhea over the past 2
years gradually becoming more frequent.

EXAM:
CT ABDOMEN AND PELVIS WITH CONTRAST
TECHNIQUE: Multidetector CT imaging of the abdomen and pelvis was performed
using the standard protocol following bolus administration of
intravenous contrast.
CONTRAST:  100mL [15] IOPAMIDOL ([15]) INJECTION 61%

[Series 2: abd/pel w · axial · 0.61mm/px · z∈[-449,-59]mm · 11 of 88 slices shown, 13 images]
[im 5/88  soft-tissue]
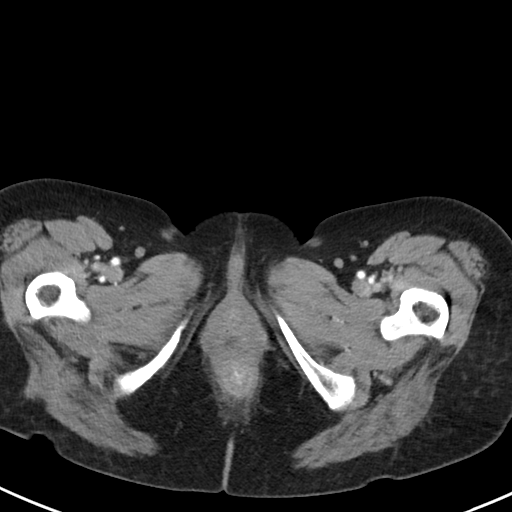
[im 5/88  bone]
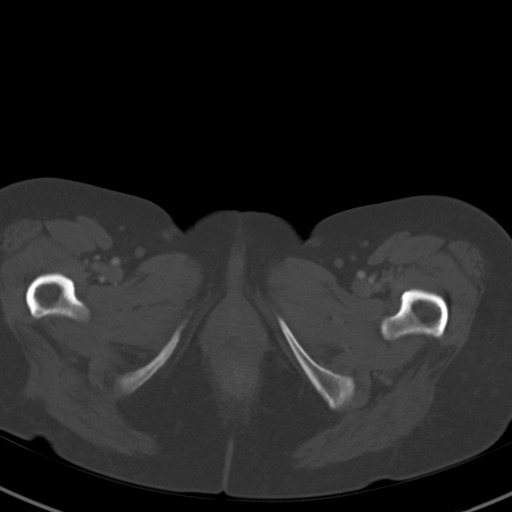
[im 14/88  soft-tissue]
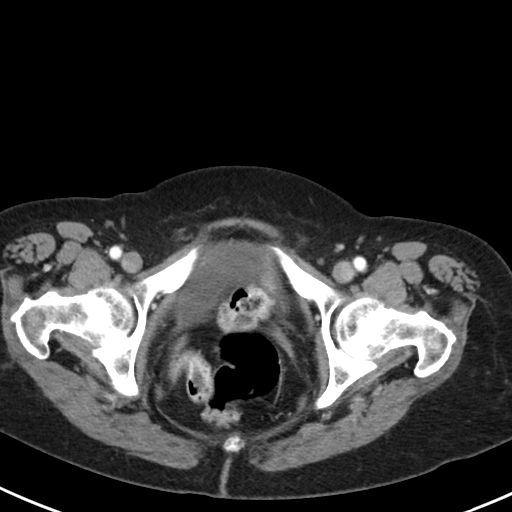
[im 23/88  soft-tissue]
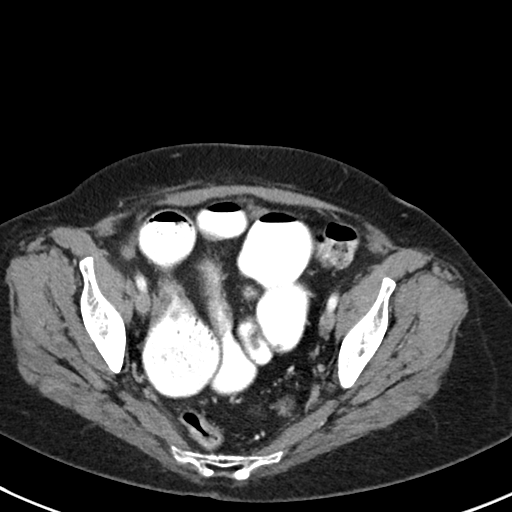
[im 28/88  soft-tissue]
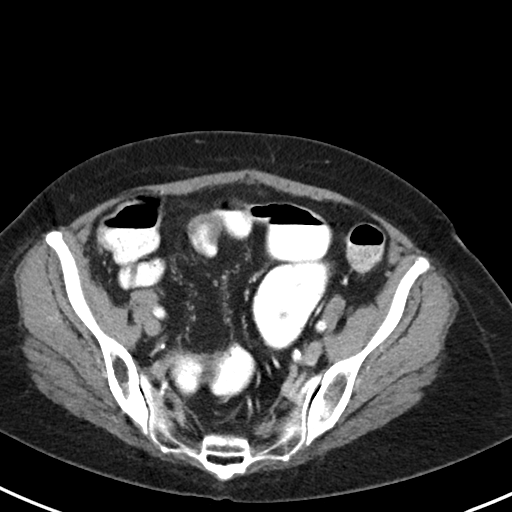
[im 37/88  soft-tissue]
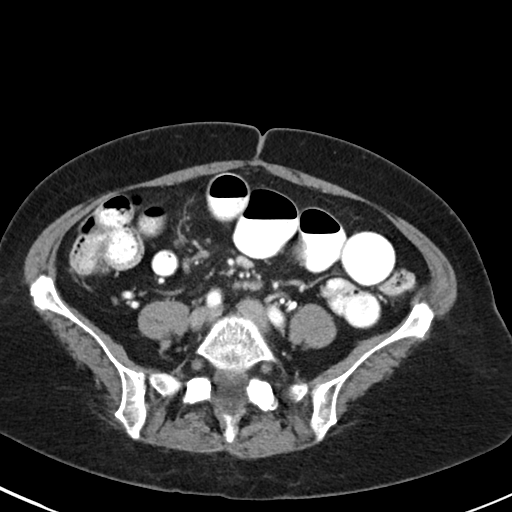
[im 46/88  soft-tissue]
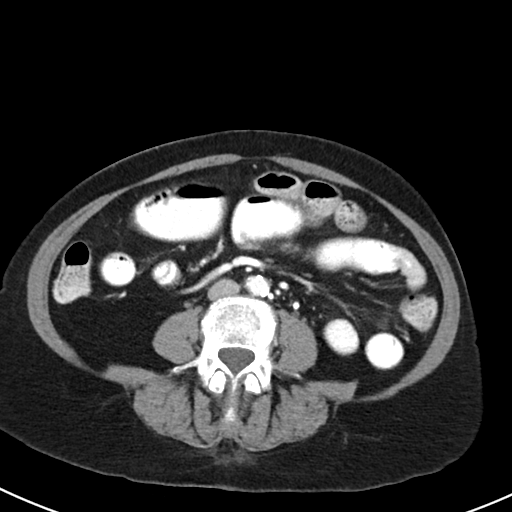
[im 51/88  soft-tissue]
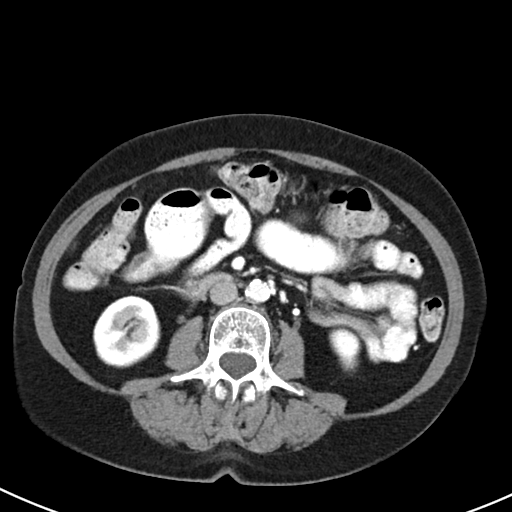
[im 60/88  soft-tissue]
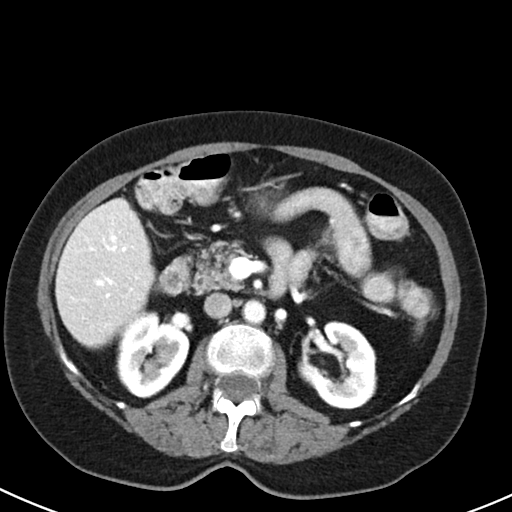
[im 65/88  soft-tissue]
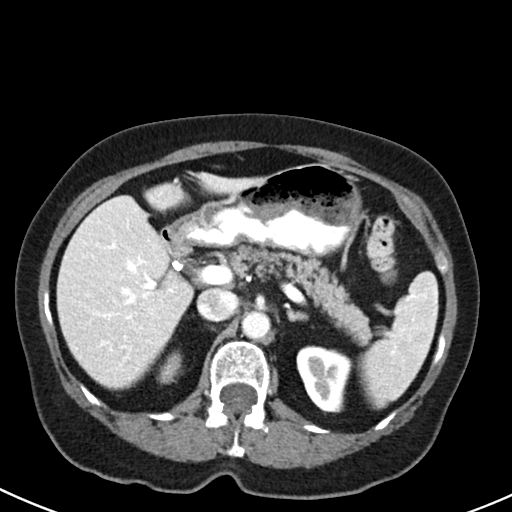
[im 65/88  bone]
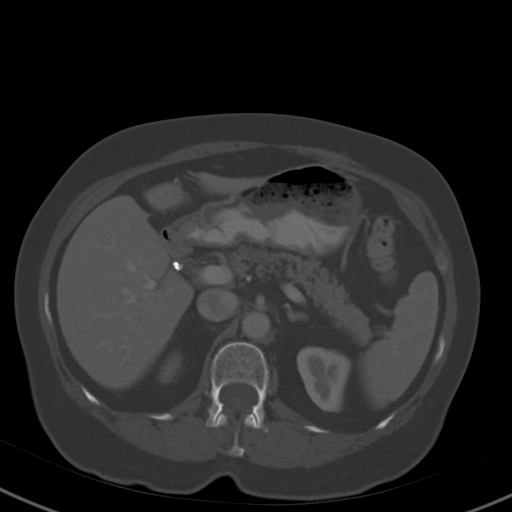
[im 74/88  soft-tissue]
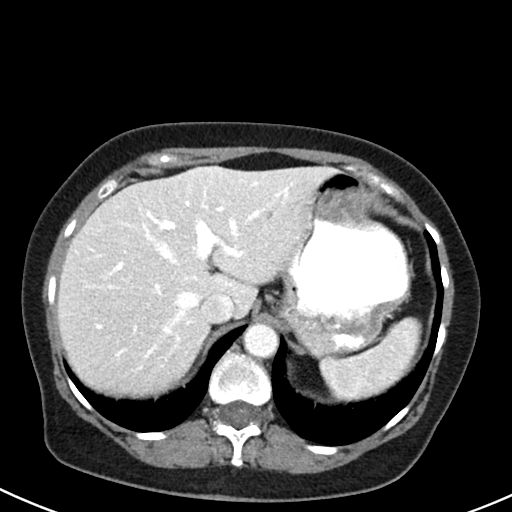
[im 83/88  soft-tissue]
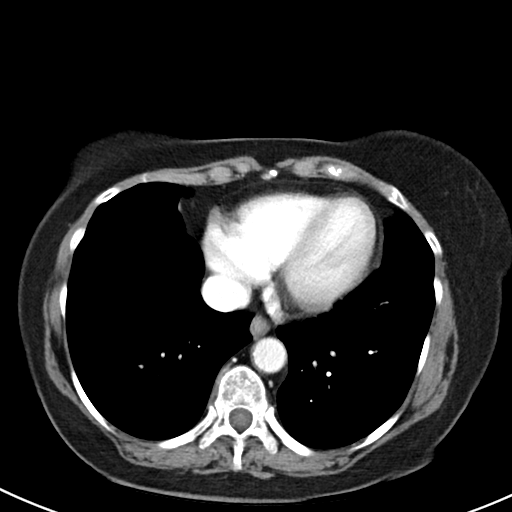

[Series 6: abd/pel w st · coronal · 0.56mm/px · 3 of 68 slices shown]
[im 23/68  soft-tissue]
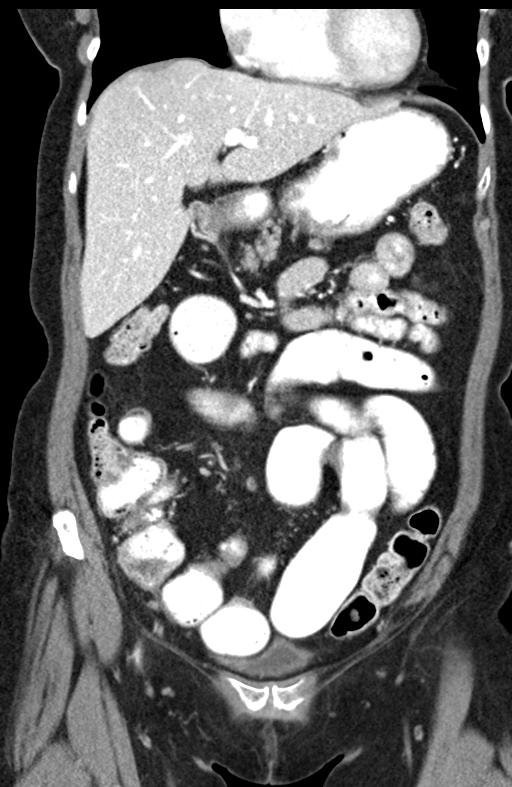
[im 30/68  soft-tissue]
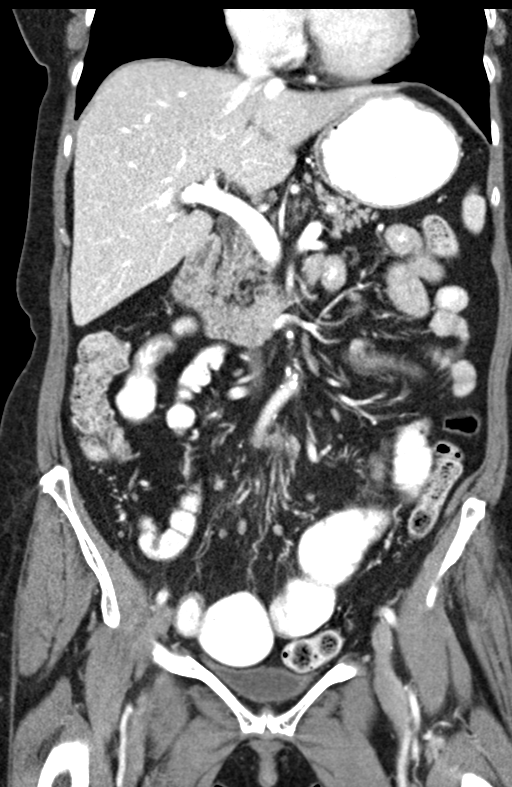
[im 38/68  soft-tissue]
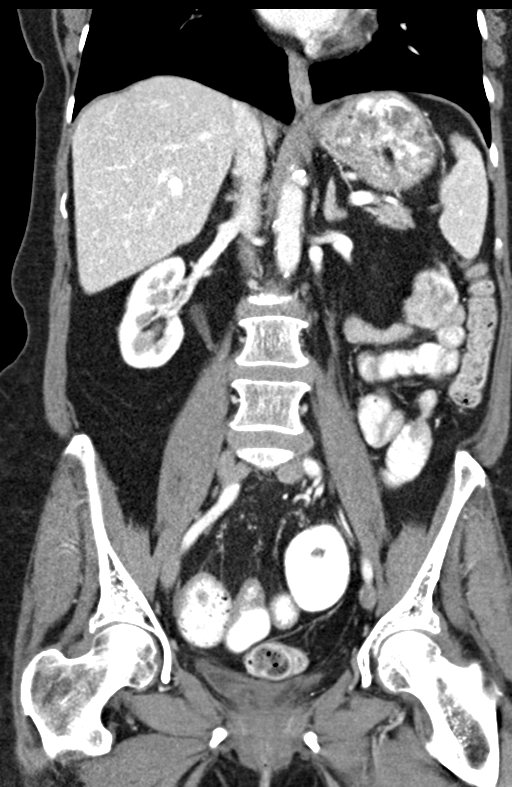

[14 of 46 positions shown; findings below may reference images not displayed]

FINDINGS: Lower chest: Unremarkable.

Hepatobiliary: Several subcentimeter low-attenuation lesions are
noted in the liver, too small to characterize, but statistically
likely to represent tiny cysts. In addition, there is a well-defined
low-attenuation lesion in segment 2 which is compatible with a
simple cyst. No other aggressive appearing hepatic lesions are
noted. No intra or extrahepatic biliary ductal dilatation. Status
post cholecystectomy.

Pancreas: No pancreatic mass. No pancreatic ductal dilatation. No
pancreatic or peripancreatic fluid or inflammatory changes.

Spleen: Unremarkable.

Adrenals/Urinary Tract: Bilateral kidneys and bilateral adrenal
glands are normal in appearance. No hydroureteronephrosis. Urinary
bladder is normal in appearance.

Stomach/Bowel: In the proximal stomach there is an elongated fatty
attenuation (-99 HU) lesion measuring 2.4 x 0.9 cm (axial image 20
of series 2 and sagittal image 73 of series 7) which could represent
a pedunculated lipoma, or may simply be something ingested by the
patient. The remainder of the stomach is otherwise normal in
appearance. No pathologic dilatation of small bowel or colon. The
appendix is not confidently identified and may be surgically absent.
Regardless, there are no inflammatory changes noted adjacent to the
cecum to suggest the presence of an acute appendicitis at this time.

Vascular/Lymphatic: Aortic atherosclerosis, without evidence of
aneurysm or dissection in the abdominal or pelvic vasculature. No
lymphadenopathy noted in the abdomen or pelvis.

Reproductive: Status posthysterectomy. Ovaries are not confidently
identified may be surgically absent or atrophic.

Other: No significant volume of ascites.  No pneumoperitoneum.

Musculoskeletal: There are no aggressive appearing lytic or blastic
lesions noted in the visualized portions of the skeleton.
IMPRESSION: 1. No acute findings are noted in the abdomen or pelvis to account
for the patient's symptoms.
2. Fatty attenuation structure in the proximal stomach is of
uncertain etiology and significance. This could simply represent
something ingested by the patient, but is suspicious for a
pedunculated lipoma. Further evaluation with nonemergent endoscopy
could be considered if clinically appropriate.
3. Aortic atherosclerosis.
4. Additional incidental findings, as above.

## 2017-06-03 MED ORDER — IOPAMIDOL (ISOVUE-300) INJECTION 61%
100.0000 mL | Freq: Once | INTRAVENOUS | Status: AC | PRN
  Administered 2017-06-03: 100 mL via INTRAVENOUS

## 2017-06-03 NOTE — Telephone Encounter (Signed)
Spoke to patient, she had CT scan today and had to drink the oral contrast. Reassured patient that diarrhea is common afterwards. She has already taken some imodium for this and has helped to slow it down. Instructed her to make sure she stay hydrated. She asked about getting the results. I let her know that Dr. Loletha Carrow is out of the office until next week, but as soon as he looked at the results we would call her.

## 2017-06-09 DIAGNOSIS — L309 Dermatitis, unspecified: Secondary | ICD-10-CM | POA: Diagnosis not present

## 2017-06-09 DIAGNOSIS — L299 Pruritus, unspecified: Secondary | ICD-10-CM | POA: Diagnosis not present

## 2017-06-09 DIAGNOSIS — R21 Rash and other nonspecific skin eruption: Secondary | ICD-10-CM | POA: Diagnosis not present

## 2017-06-17 ENCOUNTER — Ambulatory Visit (INDEPENDENT_AMBULATORY_CARE_PROVIDER_SITE_OTHER): Payer: Medicare Other | Admitting: Internal Medicine

## 2017-06-17 ENCOUNTER — Encounter: Payer: Self-pay | Admitting: Internal Medicine

## 2017-06-17 VITALS — BP 104/60 | HR 62 | Temp 98.1°F | Ht 62.5 in | Wt 117.0 lb

## 2017-06-17 DIAGNOSIS — I700 Atherosclerosis of aorta: Secondary | ICD-10-CM

## 2017-06-17 DIAGNOSIS — E785 Hyperlipidemia, unspecified: Secondary | ICD-10-CM

## 2017-06-17 DIAGNOSIS — Z0000 Encounter for general adult medical examination without abnormal findings: Secondary | ICD-10-CM | POA: Diagnosis not present

## 2017-06-17 DIAGNOSIS — F39 Unspecified mood [affective] disorder: Secondary | ICD-10-CM

## 2017-06-17 DIAGNOSIS — E2839 Other primary ovarian failure: Secondary | ICD-10-CM

## 2017-06-17 DIAGNOSIS — Z7189 Other specified counseling: Secondary | ICD-10-CM | POA: Diagnosis not present

## 2017-06-17 DIAGNOSIS — G43909 Migraine, unspecified, not intractable, without status migrainosus: Secondary | ICD-10-CM

## 2017-06-17 DIAGNOSIS — K582 Mixed irritable bowel syndrome: Secondary | ICD-10-CM

## 2017-06-17 LAB — LIPID PANEL
CHOL/HDL RATIO: 4
Cholesterol: 187 mg/dL (ref 0–200)
HDL: 44.7 mg/dL (ref >39.00)
NonHDL: 142.29
Triglycerides: 215 mg/dL — ABNORMAL HIGH (ref 0.0–149.0)
VLDL: 43 mg/dL — AB (ref 0.0–40.0)

## 2017-06-17 LAB — CBC WITH DIFFERENTIAL/PLATELET
BASOS ABS: 0 10*3/uL (ref 0.0–0.1)
Basophils Relative: 0.3 % (ref 0.0–3.0)
Eosinophils Absolute: 0.2 10*3/uL (ref 0.0–0.7)
Eosinophils Relative: 3.1 % (ref 0.0–5.0)
HCT: 41 % (ref 36.0–46.0)
HEMOGLOBIN: 13.7 g/dL (ref 12.0–15.0)
LYMPHS ABS: 1.9 10*3/uL (ref 0.7–4.0)
LYMPHS PCT: 25.5 % (ref 12.0–46.0)
MCHC: 33.4 g/dL (ref 30.0–36.0)
MCV: 89.1 fl (ref 78.0–100.0)
MONOS PCT: 8.4 % (ref 3.0–12.0)
Monocytes Absolute: 0.6 10*3/uL (ref 0.1–1.0)
NEUTROS PCT: 62.7 % (ref 43.0–77.0)
Neutro Abs: 4.6 10*3/uL (ref 1.4–7.7)
Platelets: 295 10*3/uL (ref 150.0–400.0)
RBC: 4.6 Mil/uL (ref 3.87–5.11)
RDW: 13.5 % (ref 11.5–15.5)
WBC: 7.4 10*3/uL (ref 4.0–10.5)

## 2017-06-17 LAB — COMPREHENSIVE METABOLIC PANEL
ALBUMIN: 3.5 g/dL (ref 3.5–5.2)
ALK PHOS: 51 U/L (ref 39–117)
ALT: 11 U/L (ref 0–35)
AST: 14 U/L (ref 0–37)
BILIRUBIN TOTAL: 0.4 mg/dL (ref 0.2–1.2)
BUN: 8 mg/dL (ref 6–23)
CALCIUM: 9 mg/dL (ref 8.4–10.5)
CO2: 27 mEq/L (ref 19–32)
Chloride: 103 mEq/L (ref 96–112)
Creatinine, Ser: 0.86 mg/dL (ref 0.40–1.20)
GFR: 68.32 mL/min (ref >60.00)
Glucose, Bld: 103 mg/dL — ABNORMAL HIGH (ref 70–99)
POTASSIUM: 4.5 meq/L (ref 3.5–5.1)
Sodium: 138 mEq/L (ref 135–145)
TOTAL PROTEIN: 5.8 g/dL — AB (ref 6.0–8.3)

## 2017-06-17 LAB — LDL CHOLESTEROL, DIRECT: LDL DIRECT: 115 mg/dL

## 2017-06-17 MED ORDER — PROPRANOLOL HCL 40 MG PO TABS: 40.0000 mg | ORAL_TABLET | Freq: Two times a day (BID) | ORAL | 3 refills | Status: DC

## 2017-06-17 NOTE — Assessment & Plan Note (Signed)
See social history 

## 2017-06-17 NOTE — Progress Notes (Signed)
Subjective:    Patient ID: [name], female    DOB: 04/15/1941, 76 y.o.   MRN: [id number]  HPI Here for Medicare wellness and follow up of chronic health conditions Reviewed form and advanced directives Reviewed other doctors No alcohol or tobacco Not exercising--discussed. Goes with husband at times though (not recently due to GI problems) Vision okay Uses hearing aides-- left ear (not helping much now) No falls Independent with instrumental ADLs No sig memory issues--just some recall problems  Has a rash--probably allergic reaction  On arms and very itchy Has seen Dr Nicole Kindred Using Alexis Frock with clobetasol--some better  Ongoing intestinal problems IBS is ongoing Recent CT scan--reviewed No acute findings---does have incidental aortic atherosclerosis (is on statin) Some help with rare tramadol  Ongoing anxiety issues Chronic "nervous tizzy"---episodic "jittery" inside She does okay without medications Still gets depressed at times---mostly related to symptoms. Not anhedonic  Continues on propranolol as migraine prophylaxis Still occasional focal shooting headache--but mild and brief  No heartburn currently-- had had burning after OJ (which she has stopped) No dysphagia  Current Outpatient Prescriptions on File Prior to Visit  Medication Sig Dispense Refill  . calcium-vitamin D (OSCAL WITH D 250-125) 250-125 MG-UNIT per tablet Take 1 tablet by mouth daily.      Marland Kitchen docusate sodium (COLACE) 100 MG capsule Take 100 mg by mouth as directed.      . ferrous fumarate (HEMOCYTE - 106 MG FE) 325 (106 FE) MG TABS tablet Take 1 tablet (106 mg of iron total) by mouth daily. 30 each 3  . fish oil-omega-3 fatty acids 1000 MG capsule Take 2 g by mouth daily.      . Multiple Vitamin (MULTIVITAMIN) tablet Take 1 tablet by mouth daily.      . Potassium Gluconate 595 MG CAPS Take by mouth every other day.    . pravastatin (PRAVACHOL) 20 MG tablet Take 1 tablet (20 mg total) by mouth  daily. 90 tablet 3  . Probiotic Product (PROBIOTIC PO) Take by mouth daily.    . propranolol (INDERAL) 40 MG tablet Take 1 tablet (40 mg total) by mouth 2 (two) times daily. 180 tablet 3  . traMADol (ULTRAM) 50 MG tablet Take 1 tablet (50 mg total) by mouth every 6 (six) hours as needed. 12 tablet 0  . triamcinolone ointment (KENALOG) 0.1 % Apply 1 application topically 3 (three) times a week.    . vitamin B-12 (CYANOCOBALAMIN) 1000 MCG tablet Take 1,000 mcg by mouth daily.     No current facility-administered medications on file prior to visit.     Allergies  Allergen Reactions  . Clonazepam     REACTION: Swollen lips, sore mouth  . Penicillins     REACTION: unrecalled reaction    Past Medical History:  Diagnosis Date  . Anxiety   . Aortic atherosclerosis ([place])   . GERD (gastroesophageal reflux disease)    Dr. [name]  . HLD (hyperlipidemia)    Dr. Lia Foyer  . IBS (irritable bowel syndrome)   . LBBB (left bundle branch block)   . Migraine   . OA (osteoarthritis)   . Osteopenia 2005  . Ulcer 2003   bleeding stomach ulcer    Past Surgical History:  Procedure Laterality Date  . ABDOMINAL HYSTERECTOMY  1983  . APPENDECTOMY  1983  . CARDIAC CATHETERIZATION  1997   okay, mild changes  . CHOLECYSTECTOMY  1992  . COLONOSCOPY  2003  . DEXA N/A 2005   borderline osteopenia  .  TONSILLECTOMY AND ADENOIDECTOMY  1953  . UPPER GASTROINTESTINAL ENDOSCOPY  2003    Family History  Problem Relation Age of Onset  . Heart attack Father 82  . Diabetes Father   . Coronary artery disease Father   . COPD Mother   . Hypertension Mother   . Pulmonary fibrosis Brother 19  . Hypertension Sister   . Cancer Daughter        breast  . Rheum arthritis Daughter   . Colon cancer Neg Hx     Social History   Social History  . Marital status: Married    Spouse name: N/A  . Number of children: N/A  . Years of education: N/A   Occupational History  . Retired Actuary asst.    Social History Main Topics  . Smoking status: Never Smoker  . Smokeless tobacco: Never Used  . Alcohol use No  . Drug use: No  . Sexual activity: Not on file   Other Topics Concern  . Not on file   Social History Narrative   Has living will   Husband, then daughters, are [name]   Would accept resuscitation attempts but no prolonged artifical ventilation   No prolonged tube feeds if cognitively impaired   Review of Systems Appetite is okay--not much Weight fairly stable Sleep is usually okay Wears seat belt Teeth are okay-- keeps up with dentist No urinary problems. No blood No chest pain or SOB No dizziness or syncope No other skin problems Some low back pain-- tylenol helps. No other joint problems    Objective:   Physical Exam  Constitutional: She is oriented to person, place, and time. She appears well-developed and well-nourished. No distress.  HENT:  Mouth/Throat: Oropharynx is clear and moist. No oropharyngeal exudate.  Neck: Normal range of motion. No thyromegaly present.  Cardiovascular: Normal rate, regular rhythm, normal heart sounds and intact distal pulses.  Exam reveals no gallop.   No murmur heard. Pulmonary/Chest: Effort normal and breath sounds normal. No respiratory distress. She has no wheezes. She has no rales.  Abdominal: Soft. She exhibits no distension. There is no rebound and no guarding.  Very slight generalized tenderness  Musculoskeletal: She exhibits no edema or tenderness.  Lymphadenopathy:    She has no cervical adenopathy.  Neurological: She is alert and oriented to person, place, and time.  President-- "[name], [name]" [id number] Recall 3/3  Skin:  No suspicious lesions  Psychiatric: She has a normal mood and affect. Her behavior is normal.          Assessment & Plan:

## 2017-06-17 NOTE — Assessment & Plan Note (Signed)
Found on CT scan No action needed May continue the statin

## 2017-06-17 NOTE — Assessment & Plan Note (Addendum)
I have personally reviewed the Medicare Annual Wellness questionnaire and have noted 1. The patient's medical and social history 2. Their use of alcohol, tobacco or illicit drugs 3. Their current medications and supplements 4. The patient's functional ability including ADL's, fall risks, home safety risks and hearing or visual             impairment. 5. Diet and physical activities 6. Evidence for depression or mood disorders  The patients weight, height, BMI and visual acuity have been recorded in the chart I have made referrals, counseling and provided education to the patient based review of the above and I have provided the pt with a written personalized care plan for preventive services.  I have provided you with a copy of your personalized plan for preventive services. Please take the time to review along with your updated medication list.  Normal colon in 2014 Will discuss mammogram next year--daughter did have breast cancer though UTD on vaccines--yearly flu vaccine Discussed exercise DEXA--bisphosphonate if osteoporosis

## 2017-06-17 NOTE — Assessment & Plan Note (Signed)
Propranolol continues to be effective

## 2017-06-17 NOTE — Patient Instructions (Signed)
Please stop the pravastatin for several weeks to see if that helps your intestines. You can also stop the iron (ferrous fumarate), fish oil, calcium and multivitamin. If you are not any better--you can restart. If you are better, add back medications one at a time every couple of weeks to see if you can find the culprit.

## 2017-06-17 NOTE — Assessment & Plan Note (Signed)
Discussed primary prevention Try off to see if IBS better---if not, can restart if she wishes

## 2017-06-17 NOTE — Assessment & Plan Note (Signed)
Chronic mild anxiety and dysthymia No meds at present

## 2017-06-17 NOTE — Assessment & Plan Note (Signed)
Ongoing bothersome symptoms Discussed stopping some of her meds to see if that helps

## 2017-06-24 ENCOUNTER — Encounter: Payer: Self-pay | Admitting: Internal Medicine

## 2017-07-16 ENCOUNTER — Other Ambulatory Visit: Payer: Medicare Other

## 2017-08-07 DIAGNOSIS — L900 Lichen sclerosus et atrophicus: Secondary | ICD-10-CM | POA: Diagnosis not present

## 2017-08-27 ENCOUNTER — Other Ambulatory Visit: Payer: Medicare Other

## 2017-09-09 ENCOUNTER — Ambulatory Visit
Admission: RE | Admit: 2017-09-09 | Discharge: 2017-09-09 | Disposition: A | Discharge status: Home / Self Care | Payer: Medicare Other | Source: Ambulatory Visit | Attending: Internal Medicine | Admitting: Internal Medicine

## 2017-09-09 DIAGNOSIS — M8589 Other specified disorders of bone density and structure, multiple sites: Secondary | ICD-10-CM | POA: Diagnosis not present

## 2017-09-09 DIAGNOSIS — E2839 Other primary ovarian failure: Secondary | ICD-10-CM

## 2017-09-09 DIAGNOSIS — M8588 Other specified disorders of bone density and structure, other site: Secondary | ICD-10-CM | POA: Insufficient documentation

## 2017-09-09 DIAGNOSIS — M85852 Other specified disorders of bone density and structure, left thigh: Secondary | ICD-10-CM | POA: Diagnosis not present

## 2017-09-09 DIAGNOSIS — Z780 Asymptomatic menopausal state: Secondary | ICD-10-CM | POA: Diagnosis not present

## 2017-09-24 ENCOUNTER — Ambulatory Visit (INDEPENDENT_AMBULATORY_CARE_PROVIDER_SITE_OTHER): Payer: Medicare Other

## 2017-09-24 DIAGNOSIS — Z23 Encounter for immunization: Secondary | ICD-10-CM

## 2017-10-09 DIAGNOSIS — H2513 Age-related nuclear cataract, bilateral: Secondary | ICD-10-CM | POA: Diagnosis not present

## 2017-12-11 ENCOUNTER — Other Ambulatory Visit: Payer: Self-pay | Admitting: Internal Medicine

## 2018-02-15 DIAGNOSIS — L900 Lichen sclerosus et atrophicus: Secondary | ICD-10-CM | POA: Diagnosis not present

## 2018-04-01 ENCOUNTER — Other Ambulatory Visit: Payer: Self-pay | Admitting: Internal Medicine

## 2018-04-01 DIAGNOSIS — Z1231 Encounter for screening mammogram for malignant neoplasm of breast: Secondary | ICD-10-CM

## 2018-04-23 ENCOUNTER — Ambulatory Visit
Admission: RE | Admit: 2018-04-23 | Discharge: 2018-04-23 | Disposition: A | Discharge status: Home / Self Care | Payer: Medicare HMO | Source: Ambulatory Visit | Attending: Internal Medicine | Admitting: Internal Medicine

## 2018-04-23 DIAGNOSIS — Z1231 Encounter for screening mammogram for malignant neoplasm of breast: Secondary | ICD-10-CM | POA: Diagnosis not present

## 2018-04-23 IMAGING — MG DIGITAL SCREENING BILATERAL MAMMOGRAM WITH TOMO AND CAD
8 series · 9 of 24 positions shown · non-contrast
Comparison: Previous exam(s).

CLINICAL DATA: Screening.

EXAM:
DIGITAL SCREENING BILATERAL MAMMOGRAM WITH TOMO AND CAD

[R CC synth-2D]
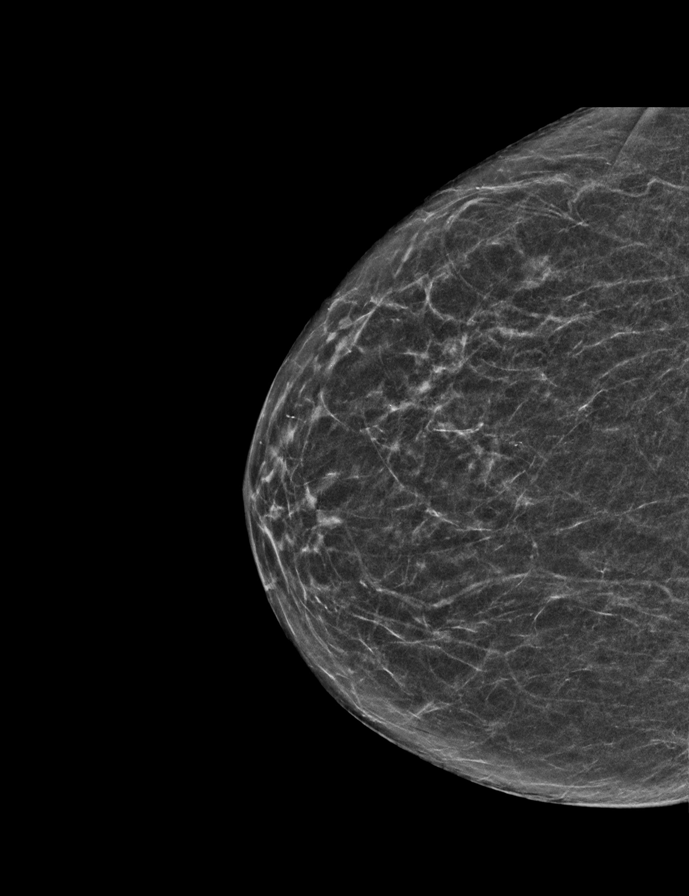

[L CC synth-2D]
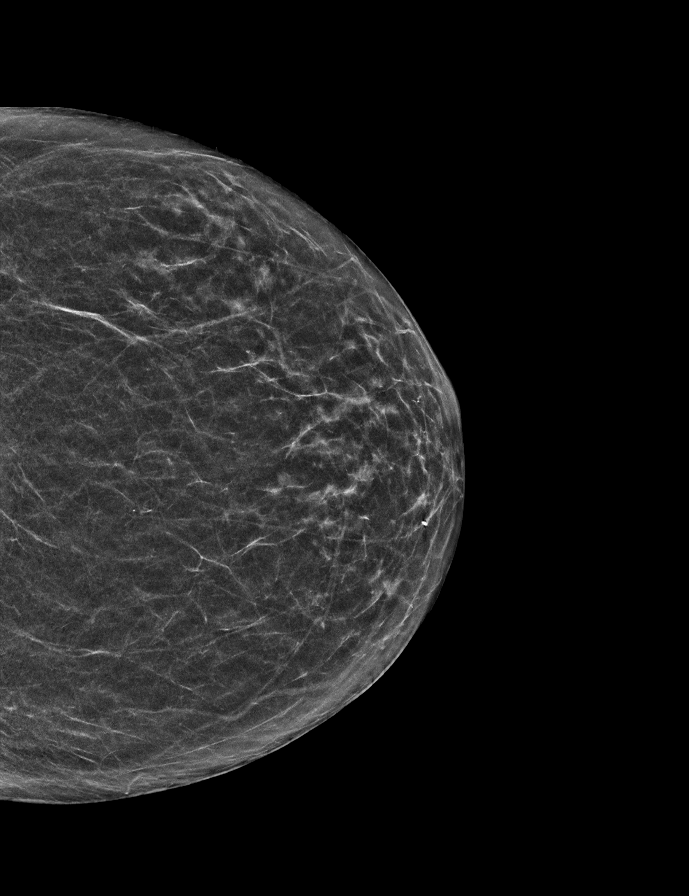

[R MLO synth-2D]
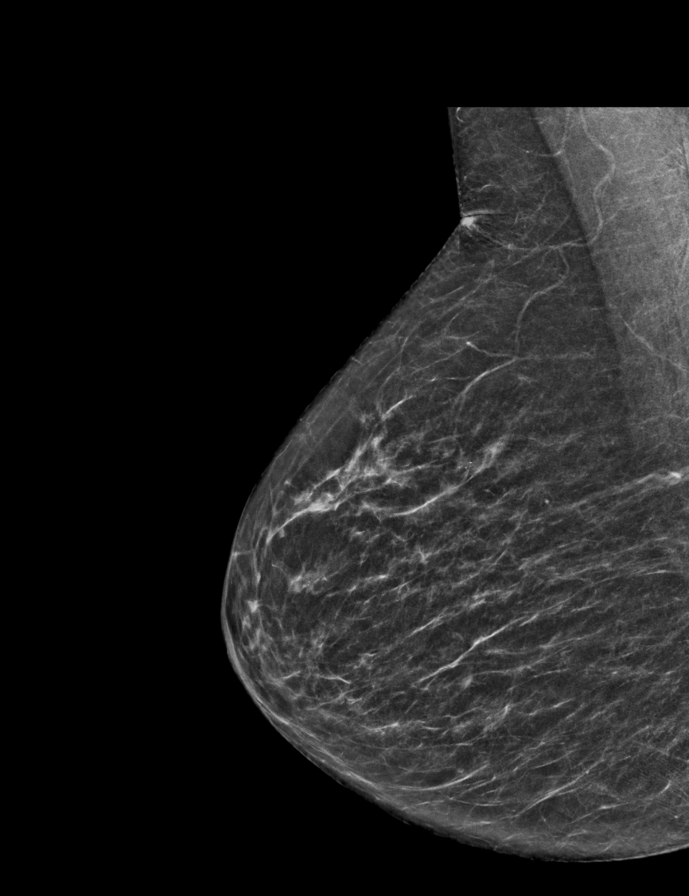

[L MLO synth-2D]
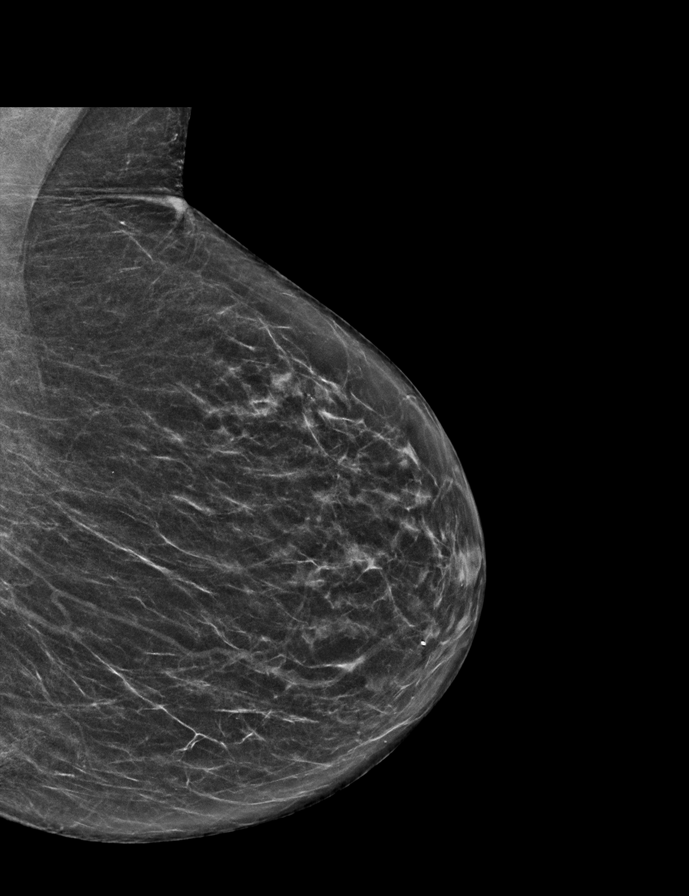

[R MLO tomo · 2 of 51 frames shown]
[frame 17/51]
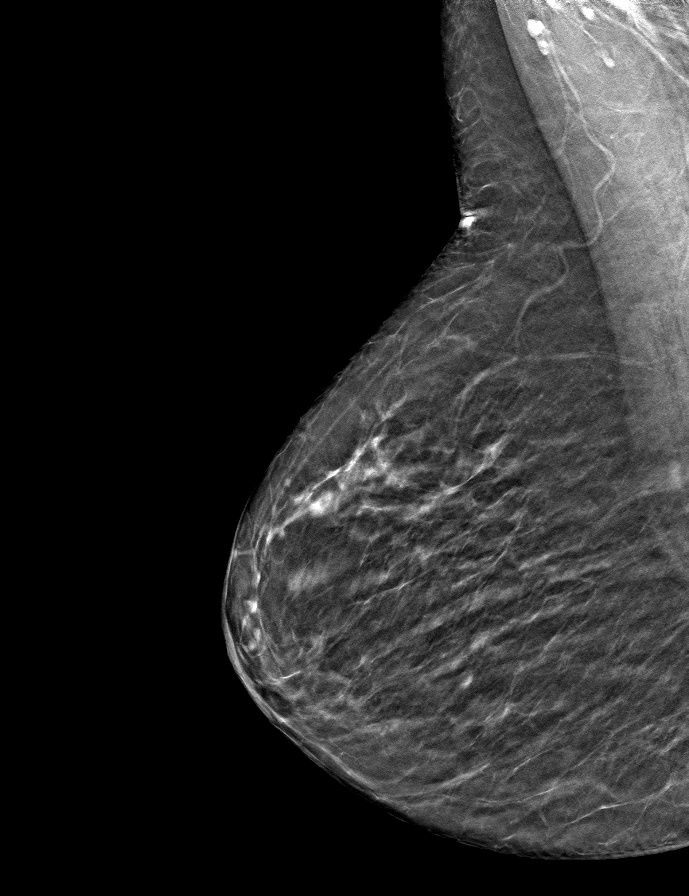
[frame 26/51]
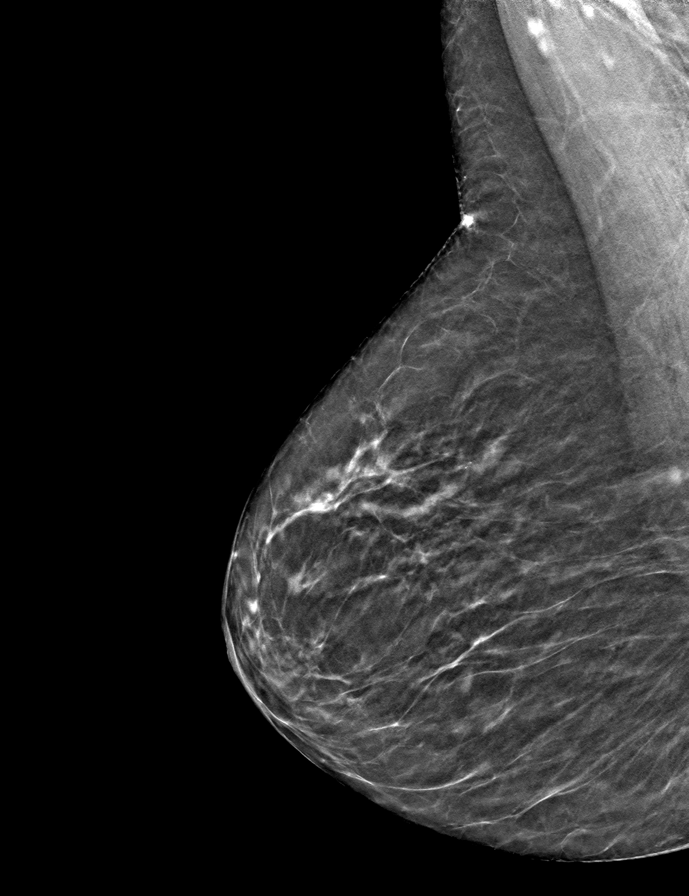

[L MLO tomo · tomo slice 27/52.0]
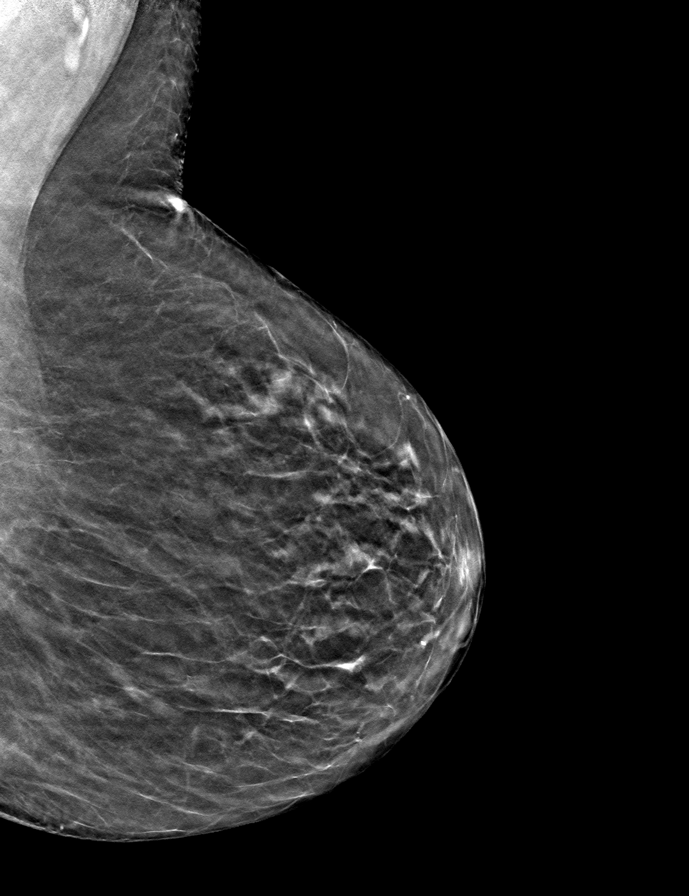

[L CC tomo · tomo slice 23/46.0]
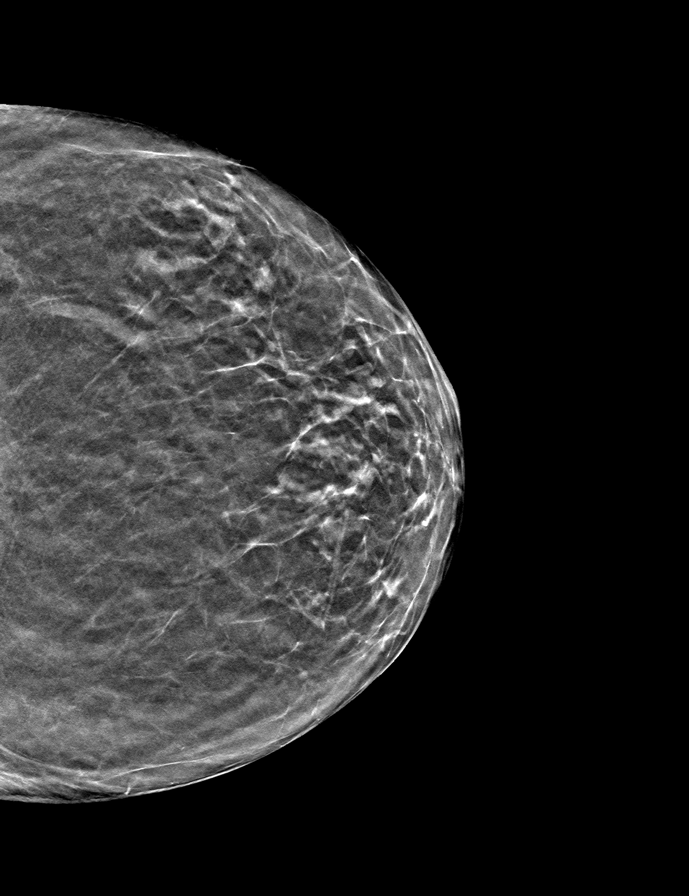

[R CC tomo · tomo slice 23/46.0]
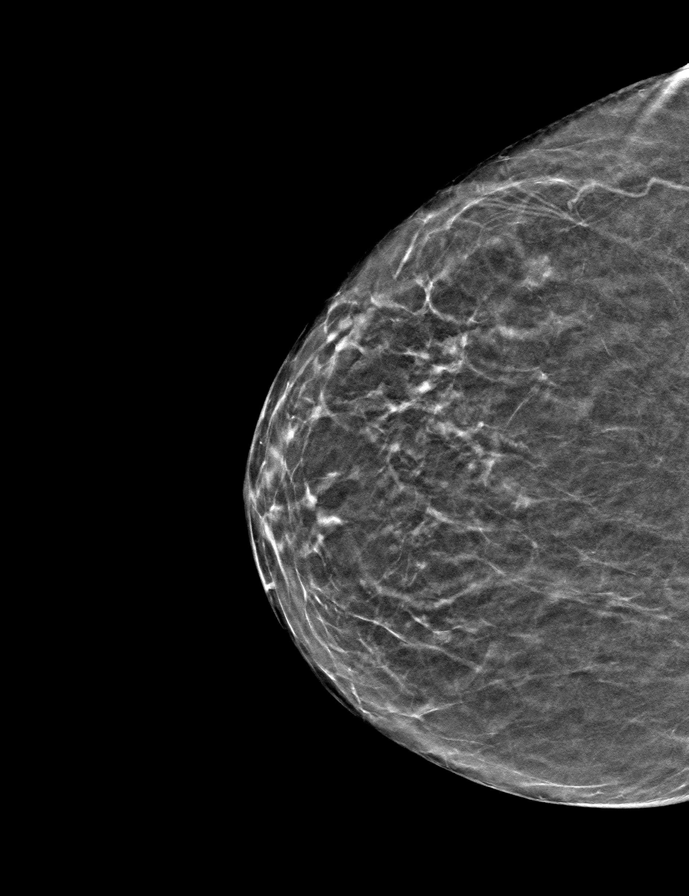

[9 of 24 positions shown; findings below may reference images not displayed]

ACR Breast Density Category b: There are scattered areas of
fibroglandular density.
FINDINGS: There are no findings suspicious for malignancy. Images were
processed with CAD.
IMPRESSION: No mammographic evidence of malignancy. A result letter of this
screening mammogram will be mailed directly to the patient.

RECOMMENDATION:
Screening mammogram in one year. (Code:[TQ])

BI-RADS CATEGORY  1: Negative.

## 2018-05-17 DIAGNOSIS — R69 Illness, unspecified: Secondary | ICD-10-CM | POA: Diagnosis not present

## 2018-05-20 ENCOUNTER — Ambulatory Visit (INDEPENDENT_AMBULATORY_CARE_PROVIDER_SITE_OTHER): Payer: Medicare HMO | Admitting: Family Medicine

## 2018-05-20 ENCOUNTER — Encounter: Payer: Self-pay | Admitting: Family Medicine

## 2018-05-20 VITALS — BP 152/90 | HR 52 | Temp 97.8°F | Ht 62.5 in | Wt 103.5 lb

## 2018-05-20 DIAGNOSIS — R1030 Lower abdominal pain, unspecified: Secondary | ICD-10-CM | POA: Diagnosis not present

## 2018-05-20 DIAGNOSIS — K582 Mixed irritable bowel syndrome: Secondary | ICD-10-CM | POA: Diagnosis not present

## 2018-05-20 DIAGNOSIS — R634 Abnormal weight loss: Secondary | ICD-10-CM | POA: Diagnosis not present

## 2018-05-20 LAB — CBC WITH DIFFERENTIAL/PLATELET
BASOS ABS: 0 10*3/uL (ref 0.0–0.1)
BASOS PCT: 0.5 % (ref 0.0–3.0)
Eosinophils Absolute: 0.2 10*3/uL (ref 0.0–0.7)
Eosinophils Relative: 3.6 % (ref 0.0–5.0)
HEMATOCRIT: 44.7 % (ref 36.0–46.0)
HEMOGLOBIN: 15.2 g/dL — AB (ref 12.0–15.0)
LYMPHS PCT: 27.4 % (ref 12.0–46.0)
Lymphs Abs: 1.7 10*3/uL (ref 0.7–4.0)
MCHC: 33.9 g/dL (ref 30.0–36.0)
MCV: 90.7 fl (ref 78.0–100.0)
MONOS PCT: 8.1 % (ref 3.0–12.0)
Monocytes Absolute: 0.5 10*3/uL (ref 0.1–1.0)
Neutro Abs: 3.7 10*3/uL (ref 1.4–7.7)
Neutrophils Relative %: 60.4 % (ref 43.0–77.0)
PLATELETS: 276 10*3/uL (ref 150.0–400.0)
RBC: 4.92 Mil/uL (ref 3.87–5.11)
RDW: 12.8 % (ref 11.5–15.5)
WBC: 6.2 10*3/uL (ref 4.0–10.5)

## 2018-05-20 LAB — COMPREHENSIVE METABOLIC PANEL
ALT: 10 U/L (ref 0–35)
AST: 14 U/L (ref 0–37)
Albumin: 4.1 g/dL (ref 3.5–5.2)
Alkaline Phosphatase: 76 U/L (ref 39–117)
BUN: 7 mg/dL (ref 6–23)
CALCIUM: 9.5 mg/dL (ref 8.4–10.5)
CHLORIDE: 101 meq/L (ref 96–112)
CO2: 30 mEq/L (ref 19–32)
Creatinine, Ser: 0.76 mg/dL (ref 0.40–1.20)
GFR: 78.61 mL/min (ref >60.00)
Glucose, Bld: 101 mg/dL — ABNORMAL HIGH (ref 70–99)
POTASSIUM: 4.6 meq/L (ref 3.5–5.1)
Sodium: 138 mEq/L (ref 135–145)
Total Bilirubin: 0.4 mg/dL (ref 0.2–1.2)
Total Protein: 6.5 g/dL (ref 6.0–8.3)

## 2018-05-20 LAB — LIPASE: Lipase: 32 U/L (ref 11.0–59.0)

## 2018-05-20 NOTE — Progress Notes (Signed)
BP (!) 152/90 (BP Location: Right Arm, Patient Position: Sitting, Cuff Size: Normal)   Pulse (!) 52   Temp 97.8 F (36.6 C) (Oral)   Ht 5' 2.5" (1.588 m)   Wt 103 lb 8 oz (46.9 kg)   SpO2 96%   BMI 18.63 kg/m    CC: abd pain Subjective:    Patient ID: [name], female    DOB: [birth year], 77 y.o.   MRN: [id number]  HPI: [name] is a 77 y.o. female presenting on 05/20/2018 for Irritable Bowel Syndrome (Has trouble with gas and left abd pain/soreness- especially at night. Worse after eating something. Yesterday was really bad, causing BP to go up. Reading was 197/9. 1Has had about sxs about 1 yr.  Tried Beano and OTC gas relief meds for gas.  And Tylenol and ibuprofen for the pain. Drinks warm herbal tea in the mornings which helps. )   Several year history of diagnosed irritable bowel. Getting worse over the past year. Increasing left lower quadrant tenderness, cramping, gassiness/bloating "feels like trapped gas". Struggles finding correct diet because sometimes certain foods bother stomach, sometimes same foods don't. Abdominal soreness tends to occur mostly in the mornings. Has lost weight due to decreased appetite. Intermittent mild nausea. Occasional abdominal cramping. She does feel better after bowel movement.   She takes Nature's bounty high CFU probiotic daily.   Finds avoiding dairy products helps - now avoids all milk and juices.   Has tried beano and OTC gas relief with temporary improvement. Tramadol didn't help. Tylenol has helped.  Has annual exam next month - but upcoming trip to [facility] and wanted evaluation for recommendations prior.   Denies fevers/chills, vomiting, diarrhea or constipation, blood in stool or urine, dysuria, dysphagia, or early satiety. Denies significant GERD or heartburn. No epigastric discomfort.   S/p abd hysterectomy 1983 for endometriosis. Thinks L ovary remained.  Colonoscopy 2003, again 05/2013 - normal [name]) EGD 2003  (?pt unsure) S/p cholecystectomy.  S/p normal mammogram last month   She has seen Dr Loletha Carrow last summer - CT scan 05/2017 unrevealing - several small lesions in liver thought cysts, ?gastric pedunculated lipoma, no ovaries visualized.   Relevant past medical, surgical, family and social history reviewed and updated as indicated. Interim medical history since our last visit reviewed. Allergies and medications reviewed and updated. Outpatient Medications Prior to Visit  Medication Sig Dispense Refill  . docusate sodium (COLACE) 100 MG capsule Take 100 mg by mouth as directed.      . Multiple Vitamin (MULTIVITAMIN) tablet Take 1 tablet by mouth daily.      . Potassium Gluconate 595 MG CAPS Take by mouth every other day.    . pravastatin (PRAVACHOL) 20 MG tablet TAKE ONE TABLET BY MOUTH ONCE DAILY 90 tablet 3  . Probiotic Product (PROBIOTIC PO) Take by mouth daily.    . propranolol (INDERAL) 40 MG tablet Take 1 tablet (40 mg total) by mouth 2 (two) times daily. 180 tablet 3  . traMADol (ULTRAM) 50 MG tablet Take 1 tablet (50 mg total) by mouth every 6 (six) hours as needed. 12 tablet 0  . triamcinolone ointment (KENALOG) 0.1 % Apply 1 application topically 3 (three) times a week.    . vitamin B-12 (CYANOCOBALAMIN) 1000 MCG tablet Take 1,000 mcg by mouth daily.    . calcium-vitamin D (OSCAL WITH D 250-125) 250-125 MG-UNIT per tablet Take 1 tablet by mouth daily.      . clobetasol (TEMOVATE) 0.05 %  external solution Apply 1 application topically 2 (two) times daily.    . ferrous fumarate (HEMOCYTE - 106 MG FE) 325 (106 FE) MG TABS tablet Take 1 tablet (106 mg of iron total) by mouth daily. 30 each 3  . fish oil-omega-3 fatty acids 1000 MG capsule Take 2 g by mouth daily.       No facility-administered medications prior to visit.      Per HPI unless specifically indicated in ROS section below Review of Systems     Objective:    BP (!) 152/90 (BP Location: Right Arm, Patient Position: Sitting,  Cuff Size: Normal)   Pulse (!) 52   Temp 97.8 F (36.6 C) (Oral)   Ht 5' 2.5" (1.588 m)   Wt 103 lb 8 oz (46.9 kg)   SpO2 96%   BMI 18.63 kg/m   Wt Readings from Last 3 Encounters:  05/20/18 103 lb 8 oz (46.9 kg)  06/17/17 117 lb (53.1 kg)  05/27/17 120 lb 6.4 oz (54.6 kg)    Physical Exam  Constitutional: She appears well-developed and well-nourished. No distress.  HENT:  Mouth/Throat: Oropharynx is clear and moist. No oropharyngeal exudate.  Cardiovascular: Normal rate, regular rhythm, normal heart sounds and intact distal pulses.  No murmur heard. Pulmonary/Chest: Effort normal and breath sounds normal. No respiratory distress. She has no wheezes. She has no rales.  Abdominal: Soft. Bowel sounds are normal. She exhibits no distension and no mass. There is no hepatosplenomegaly. There is tenderness (moderate) in the right lower quadrant, suprapubic area and left lower quadrant. There is no rebound and no guarding. No hernia.  Musculoskeletal: She exhibits no edema.  Nursing note and vitals reviewed.  Results for orders placed or performed in visit on 05/20/18  Comprehensive metabolic panel  Result Value Ref Range   Sodium 138 135 - 145 mEq/L   Potassium 4.6 3.5 - 5.1 mEq/L   Chloride 101 96 - 112 mEq/L   CO2 30 19 - 32 mEq/L   Glucose, Bld 101 (H) 70 - 99 mg/dL   BUN 7 6 - 23 mg/dL   Creatinine, Ser 0.76 0.40 - 1.20 mg/dL   Total Bilirubin 0.4 0.2 - 1.2 mg/dL   Alkaline Phosphatase 76 39 - 117 U/L   AST 14 0 - 37 U/L   ALT 10 0 - 35 U/L   Total Protein 6.5 6.0 - 8.3 g/dL   Albumin 4.1 3.5 - 5.2 g/dL   Calcium 9.5 8.4 - 10.5 mg/dL   GFR 78.61 >60.00 mL/min  CBC with Differential/Platelet  Result Value Ref Range   WBC 6.2 4.0 - 10.5 K/uL   RBC 4.92 3.87 - 5.11 Mil/uL   Hemoglobin 15.2 (H) 12.0 - 15.0 g/dL   HCT 44.7 36.0 - 46.0 %   MCV 90.7 78.0 - 100.0 fl   MCHC 33.9 30.0 - 36.0 g/dL   RDW 12.8 11.5 - 15.5 %   Platelets 276.0 150.0 - 400.0 K/uL   Neutrophils  Relative % 60.4 43.0 - 77.0 %   Lymphocytes Relative 27.4 12.0 - 46.0 %   Monocytes Relative 8.1 3.0 - 12.0 %   Eosinophils Relative 3.6 0.0 - 5.0 %   Basophils Relative 0.5 0.0 - 3.0 %   Neutro Abs 3.7 1.4 - 7.7 K/uL   Lymphs Abs 1.7 0.7 - 4.0 K/uL   Monocytes Absolute 0.5 0.1 - 1.0 K/uL   Eosinophils Absolute 0.2 0.0 - 0.7 K/uL   Basophils Absolute 0.0 0.0 - 0.1 K/uL  Lipase  Result Value Ref Range   Lipase 32.0 11.0 - 59.0 U/L      Assessment & Plan:   Problem List Items Addressed This Visit    Weight loss, abnormal   Lower abdominal pain - Primary    Worsening lower abdominal pain with weight loss but no other localizing symptoms. She thinks L ovary remains. Colonoscopy without diverticulosis. CT from 05/2017 without cause found, no ovaries visualized. Check CBC, CMP, lipase for further evaluation, consider imaging (abd [place] to reassess liver vs CT to reassess ?pedunculated gastric lipoma).  Will touch base with PCP.       Relevant Orders   Comprehensive metabolic panel (Completed)   CBC with Differential/Platelet (Completed)   Lipase (Completed)   IBS    Ongoing, worsening.  Some typical IBS symptoms however concerning weight loss - see below.  In interim, discussed low FODMAP diet and handout provided, rec trial align for bloating or activia for constipation, rec avoid gas producing foods.  Consider trial levsin.           No orders of the defined types were placed in this encounter.  Orders Placed This Encounter  Procedures  . Comprehensive metabolic panel  . CBC with Differential/Platelet  . Lipase    Follow up plan: Return if symptoms worsen or fail to improve.  Ria Bush, MD

## 2018-05-20 NOTE — Patient Instructions (Addendum)
Labs today.  Increase fiber and water in the diet (handout provided today).  Exclude gas producing foods (beans, onions, celery, carrots, raisins, bananas, apricots, prunes, brussel sprouts, wheat germ, pretzels). Look at [place] provided today.  Continue lactose free diet  Trial of align for bloating/IBS symptoms (OTC) or trial of activia for constipation.

## 2018-05-22 DIAGNOSIS — E441 Mild protein-calorie malnutrition: Secondary | ICD-10-CM | POA: Insufficient documentation

## 2018-05-22 NOTE — Assessment & Plan Note (Signed)
Ongoing, worsening.  Some typical IBS symptoms however concerning weight loss - see below.  In interim, discussed low FODMAP diet and handout provided, rec trial align for bloating or activia for constipation, rec avoid gas producing foods.  Consider trial levsin.

## 2018-05-22 NOTE — Assessment & Plan Note (Addendum)
Worsening lower abdominal pain with weight loss but no other localizing symptoms. She thinks L ovary remains. Colonoscopy without diverticulosis. CT from 05/2017 without cause found, no ovaries visualized. Check CBC, CMP, lipase for further evaluation, consider imaging (abd [place] to reassess liver vs CT to reassess ?pedunculated gastric lipoma).  Will touch base with PCP.

## 2018-05-24 ENCOUNTER — Telehealth: Payer: Self-pay

## 2018-05-24 NOTE — Telephone Encounter (Signed)
-----   Message from Venia Carbon, MD sent at 05/23/2018  4:37 PM EDT ----- Labs okay Please see how she is doing ----- Message ----- From: Ria Bush, MD Sent: 05/22/2018   9:48 AM To: Venia Carbon, MD

## 2018-05-24 NOTE — Telephone Encounter (Signed)
Left message for pt to call back.  PEC, if pt calls back, please find out how her abdominal pain is doing and document in this note. Thanks

## 2018-05-25 NOTE — Telephone Encounter (Signed)
Patient is calling and she states it is doing better and she has also been monitoring what she eats. She is now 3 days free of bad pain still has some soreness. She will contact us if pain worsens.

## 2018-05-25 NOTE — Telephone Encounter (Signed)
Okay Will hold off on any follow up unless her symptoms pick back up again

## 2018-06-08 ENCOUNTER — Encounter: Payer: Self-pay | Admitting: Internal Medicine

## 2018-06-25 ENCOUNTER — Encounter: Payer: Medicare Other | Admitting: Internal Medicine

## 2018-07-06 ENCOUNTER — Encounter: Payer: Self-pay | Admitting: Internal Medicine

## 2018-07-06 ENCOUNTER — Ambulatory Visit (INDEPENDENT_AMBULATORY_CARE_PROVIDER_SITE_OTHER): Payer: Medicare HMO | Admitting: Internal Medicine

## 2018-07-06 ENCOUNTER — Telehealth: Payer: Self-pay | Admitting: Gastroenterology

## 2018-07-06 DIAGNOSIS — E441 Mild protein-calorie malnutrition: Secondary | ICD-10-CM

## 2018-07-06 DIAGNOSIS — Z7189 Other specified counseling: Secondary | ICD-10-CM | POA: Diagnosis not present

## 2018-07-06 DIAGNOSIS — R1030 Lower abdominal pain, unspecified: Secondary | ICD-10-CM

## 2018-07-06 DIAGNOSIS — G43909 Migraine, unspecified, not intractable, without status migrainosus: Secondary | ICD-10-CM

## 2018-07-06 DIAGNOSIS — F39 Unspecified mood [affective] disorder: Secondary | ICD-10-CM

## 2018-07-06 DIAGNOSIS — Z0000 Encounter for general adult medical examination without abnormal findings: Secondary | ICD-10-CM | POA: Diagnosis not present

## 2018-07-06 DIAGNOSIS — I700 Atherosclerosis of aorta: Secondary | ICD-10-CM | POA: Diagnosis not present

## 2018-07-06 DIAGNOSIS — R69 Illness, unspecified: Secondary | ICD-10-CM | POA: Diagnosis not present

## 2018-07-06 MED ORDER — PROPRANOLOL HCL 40 MG PO TABS: 40.0000 mg | ORAL_TABLET | Freq: Two times a day (BID) | ORAL | 3 refills | Status: DC

## 2018-07-06 MED ORDER — PRAVASTATIN SODIUM 20 MG PO TABS: 20.0000 mg | ORAL_TABLET | Freq: Every day | ORAL | 3 refills | Status: DC

## 2018-07-06 NOTE — Assessment & Plan Note (Signed)
Mostly related to her ongoing pain meds not indicated

## 2018-07-06 NOTE — Assessment & Plan Note (Signed)
See social history 

## 2018-07-06 NOTE — Progress Notes (Signed)
Hearing Screening Comments: Wearing a hearing aid Vision Screening Comments: November 2018

## 2018-07-06 NOTE — Patient Instructions (Signed)
Let me know by later this week if Dr Loletha Carrow' office has not called to set up an appointment.

## 2018-07-06 NOTE — Assessment & Plan Note (Signed)
I have personally reviewed the Medicare Annual Wellness questionnaire and have noted 1. The patient's medical and social history 2. Their use of alcohol, tobacco or illicit drugs 3. Their current medications and supplements 4. The patient's functional ability including ADL's, fall risks, home safety risks and hearing or visual             impairment. 5. Diet and physical activities 6. Evidence for depression or mood disorders  The patients weight, height, BMI and visual acuity have been recorded in the chart I have made referrals, counseling and provided education to the patient based review of the above and I have provided the pt with a written personalized care plan for preventive services.  I have provided you with a copy of your personalized plan for preventive services. Please take the time to review along with your updated medication list.  Yearly flu vaccine Mammogram every other year till 26 GI reevaluation now due to symptoms DEXA was okay recently Is exercising now

## 2018-07-06 NOTE — Telephone Encounter (Signed)
PCP requests re-evaluation of ongoing digestive symptoms.  Please arrange an appointment with me.  - HD

## 2018-07-06 NOTE — Telephone Encounter (Signed)
No answer on phone number listed in [place]. Sent a my chart message with appointment information. Follow up scheduled for 07-30-2018 1045 am

## 2018-07-06 NOTE — Progress Notes (Signed)
Subjective:    Patient ID: [name], female    DOB: [birth year], 77 y.o.   MRN: [id number]  HPI Here for Medicare wellness visit and follow up of chronic health conditions Reviewed form and advanced directives Reviewed other doctors No alcohol or tobacco Is doing some exercise now Did have 1 fall---foot slipped out. Minor injuries have now resolved (occurred about 1 month ago) No regular depression--will have down times with the pain. Cries at times. 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 Bad last night after spaghetti---wound up vomiting later in the evening No blood in stool  Reviewed imaging Aortic atherosclerosis noted on CT Is back on the statin  Continues on propranolol for migraine prophylaxis Gets occasional start of headache--doesn't develop into sig headaches though  Current Outpatient Medications on File Prior to Visit  Medication Sig Dispense Refill  . docusate sodium (COLACE) 100 MG capsule Take 100 mg by mouth as directed.      . Potassium Gluconate 595 MG CAPS Take by mouth every other day.    . pravastatin (PRAVACHOL) 20 MG tablet TAKE ONE TABLET BY MOUTH ONCE DAILY 90 tablet 3  . Probiotic Product (PROBIOTIC PO) Take by mouth daily.    . propranolol (INDERAL) 40 MG tablet Take 1 tablet (40 mg total) by mouth 2 (two) times daily. 180 tablet 3  . triamcinolone ointment (KENALOG) 0.1 % Apply 1 application topically 3 (three) times a week.    . vitamin B-12 (CYANOCOBALAMIN) 1000 MCG tablet Take 1,000 mcg by mouth daily.     No current facility-administered medications on file prior to visit.     Allergies    Allergen Reactions  . Clonazepam     REACTION: Swollen lips, sore mouth  . Penicillins     REACTION: unrecalled reaction    Past Medical History:  Diagnosis Date  . Anxiety   . Aortic atherosclerosis ([place])   . GERD (gastroesophageal reflux disease)    Dr. [name]  . HLD (hyperlipidemia)    Dr. Lia Foyer  . IBS (irritable bowel syndrome)   . LBBB (left bundle branch block)   . Migraine   . OA (osteoarthritis)   . Osteopenia 2005  . Ulcer 2003   bleeding stomach ulcer    Past Surgical History:  Procedure Laterality Date  . ABDOMINAL HYSTERECTOMY  1983  . APPENDECTOMY  1983  . CARDIAC CATHETERIZATION  1997   okay, mild changes  . CHOLECYSTECTOMY  1992  . COLONOSCOPY  2003  . DEXA N/A 2005   borderline osteopenia  . TONSILLECTOMY AND ADENOIDECTOMY  1953  . UPPER GASTROINTESTINAL ENDOSCOPY  2003    Family History  Problem Relation Age of Onset  . Heart attack Father 10  . Diabetes Father   . Coronary artery disease Father   . COPD Mother   . Hypertension Mother   . Pulmonary fibrosis Brother 26  . Hypertension Sister   . Cancer Daughter        breast  . Rheum arthritis Daughter   . Colon cancer Neg Hx     Social History   Socioeconomic History  . Marital status: Married    Spouse name: Not  on file  . Number of children: Not on file  . Years of education: Not on file  . Highest education level: Not on file  Occupational History  . Occupation: Retired Database administrator asst.  Social Needs  . Financial resource strain: Not on file  . Food insecurity:    Worry: Not on file    Inability: Not on file  . Transportation needs:    Medical: Not on file    Non-medical: Not on file  Tobacco Use  . Smoking status: Never Smoker  . Smokeless tobacco: Never Used  Substance and Sexual Activity  . Alcohol use: No  . Drug use: No  . Sexual activity: Not on file  Lifestyle  . Physical activity:    Days per week: Not on file    Minutes per session:  Not on file  . Stress: Not on file  Relationships  . Social connections:    Talks on phone: Not on file    Gets together: Not on file    Attends religious service: Not on file    Active member of club or organization: Not on file    Attends meetings of clubs or organizations: Not on file    Relationship status: Not on file  . Intimate partner violence:    Fear of current or ex partner: Not on file    Emotionally abused: Not on file    Physically abused: Not on file    Forced sexual activity: Not on file  Other Topics Concern  . 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 No other skin problems No chest pain No SOB No palpitations of note Occasional dizziness but no syncope (holds on when first getting up) No urinary problems--feels she empties well Some back pain--nothing striking and better after moving around. No other sig joint pain     Objective:   Physical Exam  Constitutional: She is oriented to person, place, and time. She appears well-developed. No distress.  HENT:  Mouth/Throat: Oropharynx is clear and moist. No oropharyngeal exudate.  Neck: No thyromegaly present.  Cardiovascular: Normal rate, regular rhythm, normal heart sounds and intact distal pulses. Exam reveals no gallop.  No murmur heard. Respiratory: Effort normal and breath sounds normal. No respiratory distress. She has no wheezes. She has no rales.  GI: She exhibits no distension. There is no rebound and no guarding.  Bilateral lower quadrant tenderness  Musculoskeletal:  She exhibits no edema.  Lymphadenopathy:    She has no cervical adenopathy.  Neurological: She is alert and oriented to person, place, and time.  President--- "[name], Abbe [place] Bush" [id number] Recall 3/3  Skin: No rash noted. No erythema.  Psychiatric: She has a normal mood and affect. Her behavior is normal.           Assessment & Plan:

## 2018-07-06 NOTE — Assessment & Plan Note (Signed)
Ongoing for some time Blood work reassuring recently but with weight loss, real concern for IBD Will have Dr Loletha Carrow see her again

## 2018-07-06 NOTE — Assessment & Plan Note (Signed)
Radiologic diagnosis Back on her statin (didn't influence her abdominal symptoms)

## 2018-07-06 NOTE — Assessment & Plan Note (Signed)
Fairly quiet on the propranolol

## 2018-07-06 NOTE — Assessment & Plan Note (Signed)
Lost ~15% of body weight in past year Further eval needed

## 2018-07-06 NOTE — Telephone Encounter (Signed)
-----   Message from Venia Carbon, MD sent at 07/06/2018 10:09 AM EDT ----- [name], Can you please have your staff set her up for a follow up. She has lost 20# since you saw her and has ongoing symptoms. Will likely need further work up [employer]

## 2018-07-30 ENCOUNTER — Ambulatory Visit: Payer: Medicare HMO | Admitting: Gastroenterology

## 2018-07-30 ENCOUNTER — Encounter: Payer: Self-pay | Admitting: Gastroenterology

## 2018-07-30 VITALS — BP 118/70 | HR 78 | Ht 62.5 in | Wt 98.0 lb

## 2018-07-30 DIAGNOSIS — R634 Abnormal weight loss: Secondary | ICD-10-CM | POA: Diagnosis not present

## 2018-07-30 DIAGNOSIS — R194 Change in bowel habit: Secondary | ICD-10-CM | POA: Diagnosis not present

## 2018-07-30 DIAGNOSIS — R1084 Generalized abdominal pain: Secondary | ICD-10-CM | POA: Diagnosis not present

## 2018-07-30 MED ORDER — CILIDINIUM-CHLORDIAZEPOXIDE 2.5-5 MG PO CAPS: 1.0000 | ORAL_CAPSULE | Freq: Two times a day (BID) | ORAL | 2 refills | Status: DC | PRN

## 2018-07-30 NOTE — Patient Instructions (Signed)
If you are age 76 or older, your body mass index should be between 23-30. Your Body mass index is 17.64 kg/m. If this is out of the aforementioned range listed, please consider follow up with your Primary Care Provider.  If you are age 16 or younger, your body mass index should be between 19-25. Your Body mass index is 17.64 kg/m. If this is out of the aformentioned range listed, please consider follow up with your Primary Care Provider.   Please take 1/2 cap Miralax a day to maintain regularity.  It was a pleasure to see you today!  Dr. Loletha Carrow

## 2018-07-30 NOTE — Progress Notes (Signed)
GI Progress Note  Chief Complaint: IBS and weight loss  Subjective  History:  [name] follows up for the first time since June 2018.  She had no improvement in abdominal pain, bloating or gas being on a FODMAP diet, and she found that diet restrictive.  Her abdomen hurts most days, and about every couple of months she has a severe episode with associated diarrhea where she will be in the bed for a couple of days.  She denies rectal bleeding.  Her appetite is been down much of the time and she has lost about 20 pounds in the last year.  She recently saw primary care for the symptoms as well and she was referred back to [place].  Donyel denies frequent heartburn, dysphagia, nausea, vomiting. Her bowel habits tend toward constipation most of the time, with small pellet-like stools that are slow to pass.  She has taken the occasional fiber supplement. My last clinic note also mentions how she had previously been prescribed clorazepate to help her sleep, which also caused significant improvement in her lower abdominal pain.  ROS: Cardiovascular:  no chest pain Respiratory: no dyspnea Chronic anxiety.  She and her husband both report that when she feels anxious, she has cramps and diarrhea. Remainder of systems negative except as above  The patient's Past Medical, Family and Social History were reviewed and are on file in the EMR.  Objective:  Med list reviewed  Current Outpatient Medications:  .  docusate sodium (COLACE) 100 MG capsule, Take 100 mg by mouth as directed.  , Disp: , Rfl:  .  OVER THE COUNTER MEDICATION, OTC eye drops for dry eyes prn, Disp: , Rfl:  .  Potassium Gluconate 595 MG CAPS, Take by mouth every other day., Disp: , Rfl:  .  pravastatin (PRAVACHOL) 20 MG tablet, Take 1 tablet (20 mg total) by mouth daily., Disp: 90 tablet, Rfl: 3 .  Probiotic Product (PROBIOTIC PO), Take by mouth daily., Disp: , Rfl:  .  propranolol (INDERAL) 40 MG tablet, Take 1 tablet (40  mg total) by mouth 2 (two) times daily. (Patient taking differently: Take 40 mg by mouth 2 (two) times daily. For migraine prevention), Disp: 180 tablet, Rfl: 3 .  triamcinolone ointment (KENALOG) 0.1 %, Apply 1 application topically 3 (three) times a week., Disp: , Rfl:  .  vitamin B-12 (CYANOCOBALAMIN) 1000 MCG tablet, Take 1,000 mcg by mouth daily., Disp: , Rfl:  .  clidinium-chlordiazePOXIDE (LIBRAX) 5-2.5 MG capsule, Take 1 capsule by mouth 2 (two) times daily as needed., Disp: 45 capsule, Rfl: 2   Vital signs in last 24 hrs: Vitals:   07/30/18 1058  BP: 118/70  Pulse: 78   120# June 2018,  98# today  Physical Exam  Pleasant, conversational, somewhat anxious appearing woman, accompanied by husband.  No muscle wasting and not acutely ill-appearing.  HEENT: sclera anicteric, oral mucosa moist without lesions  Neck: supple, no thyromegaly, JVD or lymphadenopathy  Cardiac: RRR without murmurs, S1S2 heard, no peripheral edema  Pulm: clear to auscultation bilaterally, normal RR and effort noted  Abdomen: soft,  tenderness to light palpation of entire abdomen, with active bowel sounds. No guarding or palpable hepatosplenomegaly.  Skin; warm and dry, no jaundice or rash  Recent Labs:  CBC Latest Ref Rng & Units 05/20/2018 06/17/2017 06/05/2016  WBC 4.0 - 10.5 K/uL 6.2 7.4 7.9  Hemoglobin 12.0 - 15.0 g/dL 15.2(H) 13.7 12.9  Hematocrit 36.0 - 46.0 % 44.7 41.0 39.1  Platelets 150.0 - 400.0 K/uL 276.0 295.0 300.0   CMP Latest Ref Rng & Units 05/20/2018 06/17/2017 05/27/2017  Glucose 70 - 99 mg/dL 101(H) 103(H) -  BUN 6 - 23 mg/dL 7 8 10   Creatinine 0.40 - 1.20 mg/dL 0.76 0.86 0.77  Sodium 135 - 145 mEq/L 138 138 -  Potassium 3.5 - 5.1 mEq/L 4.6 4.5 -  Chloride 96 - 112 mEq/L 101 103 -  CO2 19 - 32 mEq/L 30 27 -  Calcium 8.4 - 10.5 mg/dL 9.5 9.0 -  Total Protein 6.0 - 8.3 g/dL 6.5 5.8(L) -  Total Bilirubin 0.2 - 1.2 mg/dL 0.4 0.4 -  Alkaline Phos 39 - 117 U/L 76 51 -  AST 0 - 37  U/L 14 14 -  ALT 0 - 35 U/L 10 11 -   Albumin 4.1   Radiologic studies:  CTAP 05/2017  - noting acute.  Ingested material in stomach  @ASSESSMENTPLANBEGIN @ Assessment: Encounter Diagnoses  Name Primary?  . Generalized abdominal pain Yes  . Altered bowel habits   . Abnormal loss of weight    I still suspect she has a functional bowel disorder, and is not clear how or if the weight loss is related.  Her normal albumin seems to speak against a pathological source of weight loss.  I think her abdomen hurts much of the time and this affects her appetite and nutrition intake.   Plan: Trial of Librax 1-2 times daily as needed for severe abdominal pain.  I would like to stay on MiraLAX 1 capful to a half a capful a day to maintain regularity, since she tends toward constipation and the Librax could make that worse. She has a reported allergy to clonazepam, but when I asked her in more detail, it sounds like at some point she may have had some facial tingling that she thought may have been related to that medicine.  However, as noted above, she took clorazepate in the past without any reported trouble.  I will see her back in about 6 weeks or sooner as needed.  Total time 30 minutes, over half spent face-to-face with patient in counseling and coordination of care.   Nelida Meuse III

## 2018-08-10 ENCOUNTER — Telehealth: Payer: Self-pay | Admitting: Gastroenterology

## 2018-08-10 NOTE — Telephone Encounter (Addendum)
Given all that and the weight loss she reported, I feel we should obtain a CT scan of the abdomen and pelvis with oral and IV contrast to rule out an obstruction.  Please arrange, including updated BUN and creatinine, since last on file June 13  Stop librax if no helpful.  Half bottle magnesium citrate for relief of constipation.  Based on how that works and CT scan results, we will see about dicyclomine.  If librax not helpful, my experience is that dicyclomine not likely to be.

## 2018-08-10 NOTE — Telephone Encounter (Signed)
Patient called to report that she has had very little relief from the pain medication (Librax 2 tablets daily-takes one in am other qhs), is taking Miralax 1/2-1 capful daily, colace 1 qhs. She did not have a bowel movement from 8/23 to 8/31. She did 1 fleets enema on 8/31 and had a little relief.  She states the pain is worse at night in [place]. She says that Tylenol and heating pad helps. She is also concerned about the cost of the Librax, states that dicyclomine is more cost effective, per her pharmacist. She does have a follow up visit in Oct.

## 2018-08-11 ENCOUNTER — Other Ambulatory Visit: Payer: Self-pay

## 2018-08-11 DIAGNOSIS — R1084 Generalized abdominal pain: Secondary | ICD-10-CM

## 2018-08-11 DIAGNOSIS — R1032 Left lower quadrant pain: Secondary | ICD-10-CM

## 2018-08-11 NOTE — Telephone Encounter (Signed)
Patient advised of recommendations and scheduled for CT abdomen/pelvis at [facility] on 9/12, first available. Patient will come do lab work and pick up contrast. She did state she had some diarrhea this morning.

## 2018-08-12 ENCOUNTER — Telehealth: Payer: Self-pay

## 2018-08-12 NOTE — Telephone Encounter (Signed)
Patient had questions about the magnesium citrate, her husband picked up a bottle that was unpasteurized. Reassured patient.

## 2018-08-13 ENCOUNTER — Telehealth: Payer: Self-pay | Admitting: Gastroenterology

## 2018-08-13 NOTE — Telephone Encounter (Signed)
Noted  

## 2018-08-16 ENCOUNTER — Other Ambulatory Visit (INDEPENDENT_AMBULATORY_CARE_PROVIDER_SITE_OTHER): Payer: Medicare HMO

## 2018-08-16 DIAGNOSIS — R1084 Generalized abdominal pain: Secondary | ICD-10-CM

## 2018-08-16 DIAGNOSIS — R1032 Left lower quadrant pain: Secondary | ICD-10-CM | POA: Diagnosis not present

## 2018-08-16 LAB — CREATININE, SERUM: CREATININE: 0.76 mg/dL (ref 0.40–1.20)

## 2018-08-16 LAB — BUN: BUN: 8 mg/dL (ref 6–23)

## 2018-08-19 ENCOUNTER — Ambulatory Visit (INDEPENDENT_AMBULATORY_CARE_PROVIDER_SITE_OTHER)
Admission: RE | Admit: 2018-08-19 | Discharge: 2018-08-19 | Disposition: A | Discharge status: Home / Self Care | Payer: Medicare HMO | Source: Ambulatory Visit | Attending: Gastroenterology | Admitting: Gastroenterology

## 2018-08-19 DIAGNOSIS — R197 Diarrhea, unspecified: Secondary | ICD-10-CM | POA: Diagnosis not present

## 2018-08-19 DIAGNOSIS — R1110 Vomiting, unspecified: Secondary | ICD-10-CM | POA: Diagnosis not present

## 2018-08-19 DIAGNOSIS — R1032 Left lower quadrant pain: Secondary | ICD-10-CM

## 2018-08-19 IMAGING — CT CT ABD-PELV W/ CM
2 of 5 series · 15 of 46 positions shown, 17 images · IV contrast (iopamidol)
Comparison: [DATE]

CLINICAL DATA: Abdominal cramping, nausea/vomiting,
diarrhea/constipation, weight loss. History of IBS.

EXAM:
CT ABDOMEN AND PELVIS WITH CONTRAST
TECHNIQUE: Multidetector CT imaging of the abdomen and pelvis was performed
using the standard protocol following bolus administration of
intravenous contrast.
CONTRAST:  80mL [G6] IOPAMIDOL ([G6]) INJECTION 61%

[Series 2: abd/pel w · axial · 0.61mm/px · z∈[-442,-87]mm · 12 of 81 slices shown, 14 images]
[im 5/81  soft-tissue]
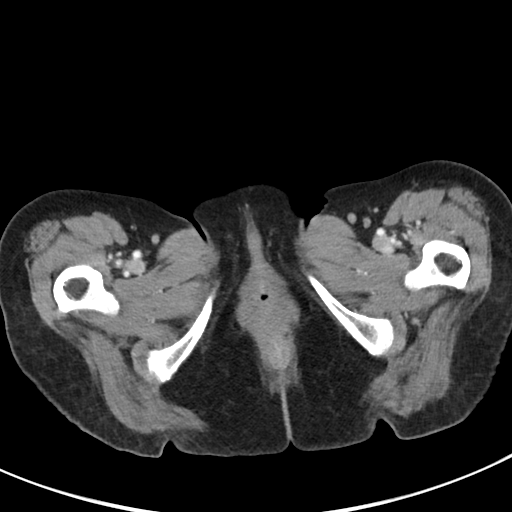
[im 5/81  bone]
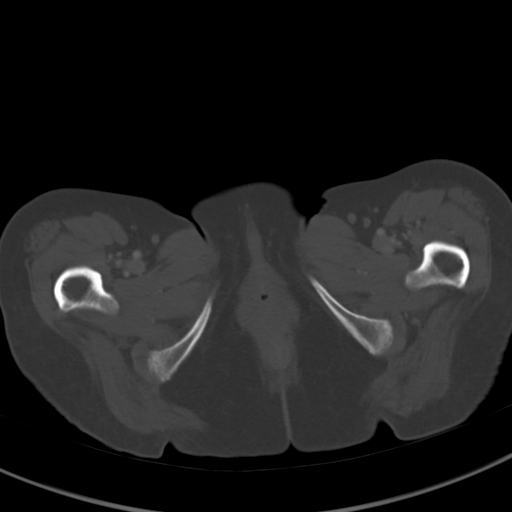
[im 13/81  soft-tissue]
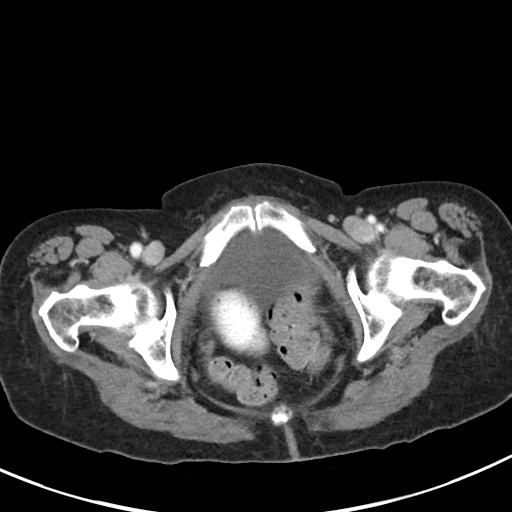
[im 17/81  soft-tissue]
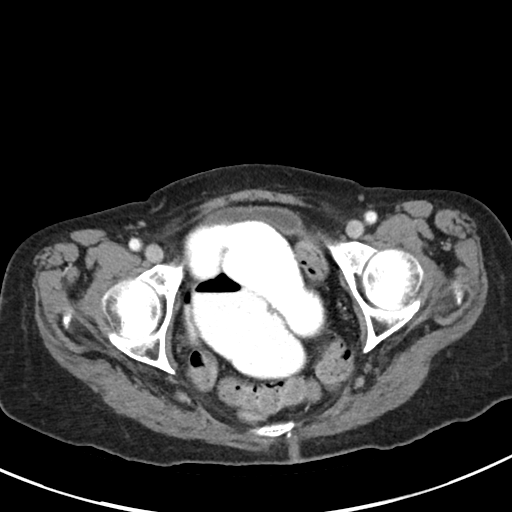
[im 26/81  soft-tissue]
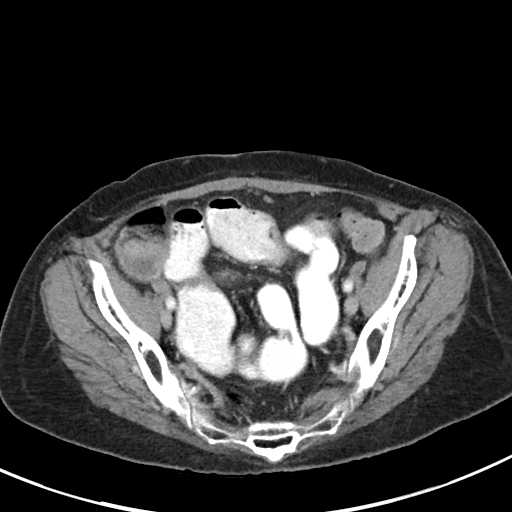
[im 30/81  soft-tissue]
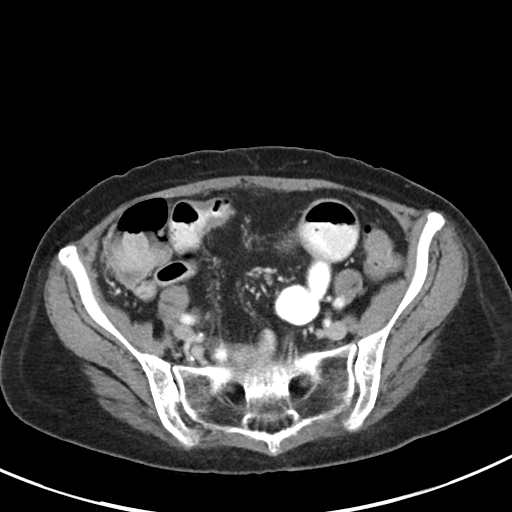
[im 38/81  soft-tissue]
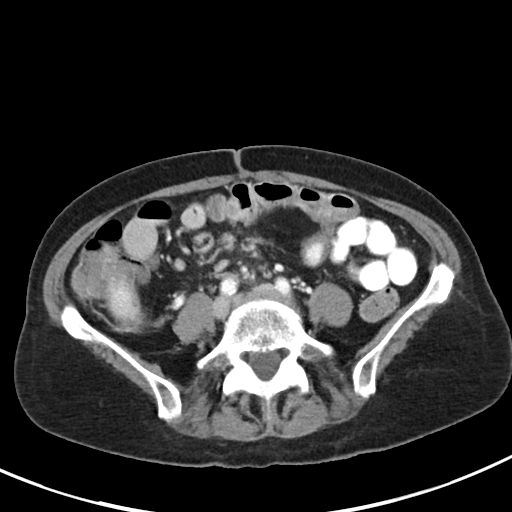
[im 43/81  soft-tissue]
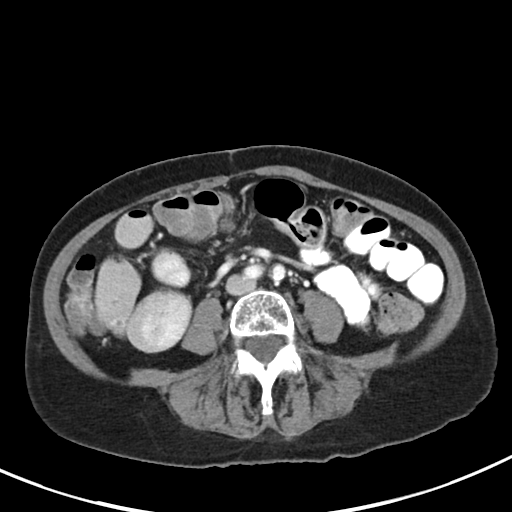
[im 51/81  soft-tissue]
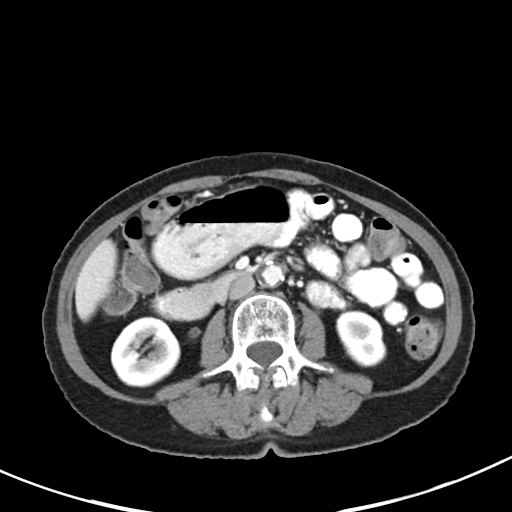
[im 55/81  soft-tissue]
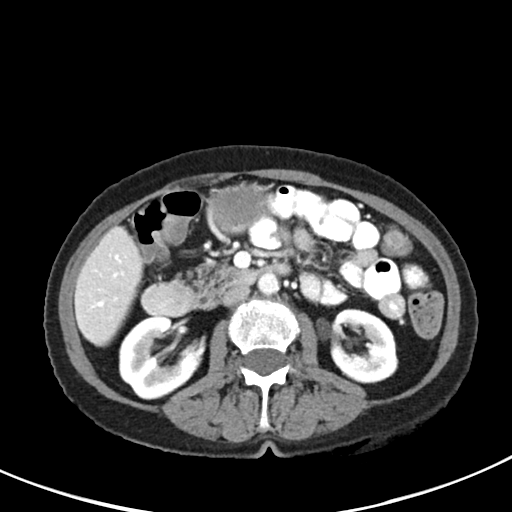
[im 55/81  bone]
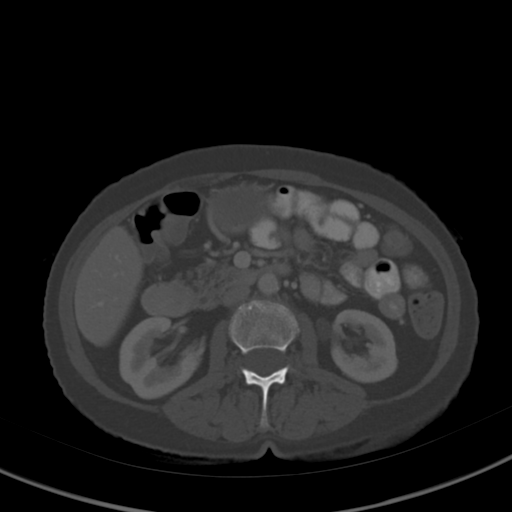
[im 64/81  soft-tissue]
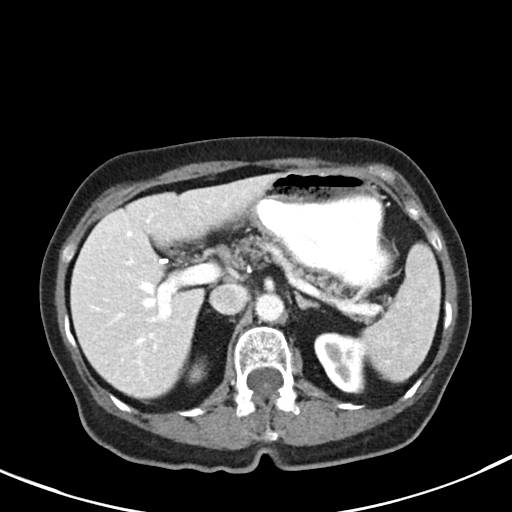
[im 68/81  soft-tissue]
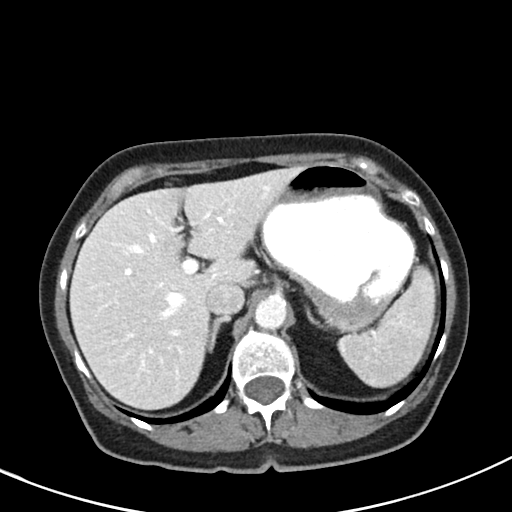
[im 76/81  soft-tissue]
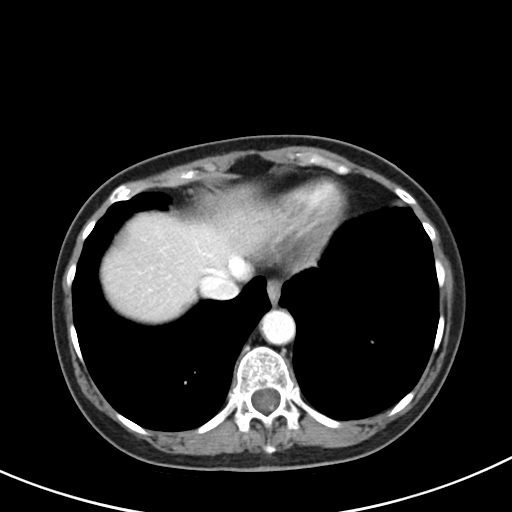

[Series 6: abd/pel w st · coronal · 0.55mm/px · 3 of 66 slices shown]
[im 22/66  soft-tissue]
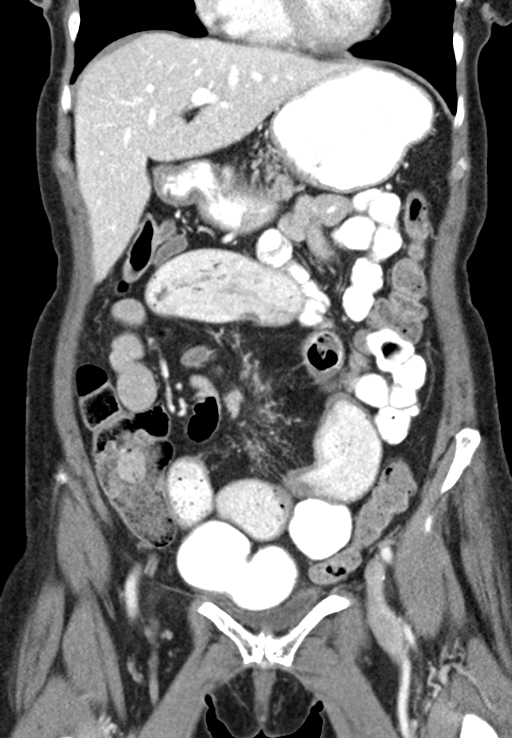
[im 29/66  soft-tissue]
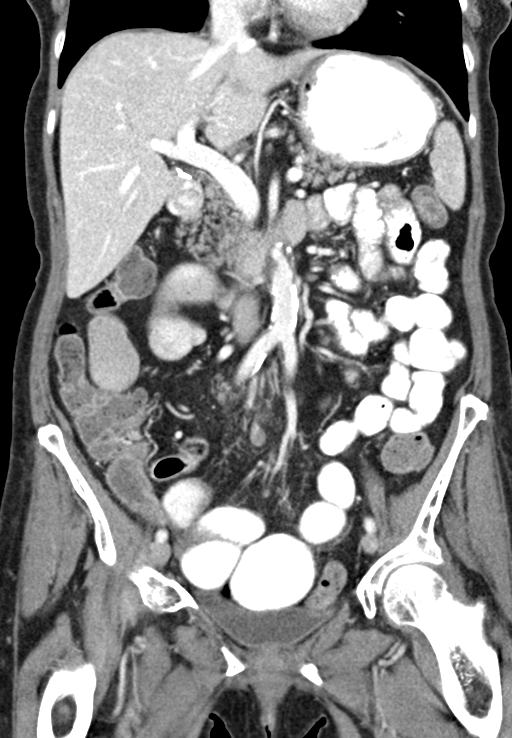
[im 37/66  soft-tissue]
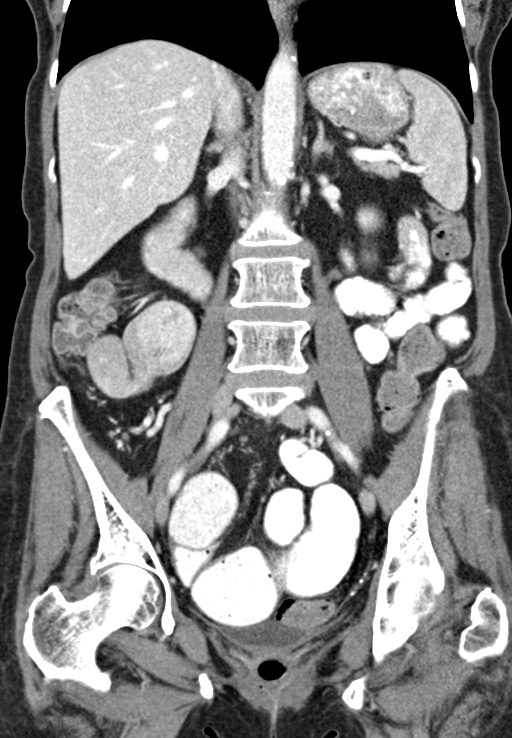

[15 of 46 positions shown; findings below may reference images not displayed]

FINDINGS: Lower chest: Lung bases are clear.

Hepatobiliary: Scattered hepatic cysts, measuring up to 10 mm in
segment 2 (series 2/image 9) unchanged.

Status post cholecystectomy. No intrahepatic or extrahepatic ductal
dilatation.

Pancreas: Within normal limits.

Spleen: Within normal limits.

Adrenals/Urinary Tract: Adrenal glands within normal limits.

Kidneys are within normal limits.  No hydronephrosis.

Bladder is normal limits.

Stomach/Bowel: Stomach is within normal limits.

Dilated, fluid-filled loops of small bowel in the lower pelvis
(series 3/image 63), similar to the prior. Terminal ileum is normal
in caliber. However, there is no focal transition in the right lower
quadrant to suggest small bowel obstruction.

No bowel wall thickening or inflammatory changes.

Appendix is not discretely visualized, reportedly surgically absent.

Vascular/Lymphatic: No evidence of abdominal aortic aneurysm.

Atherosclerotic calcifications of the abdominal aorta and branch
vessels.

No suspicious abdominopelvic lymphadenopathy.

Reproductive: Status post cholecystectomy.

No adnexal masses.

Other: No abdominopelvic ascites.

Musculoskeletal: Visualized osseous structures are within normal
limits.
IMPRESSION: No bowel wall thickening or inflammatory changes.

Mildly dilated, fluid-filled loops of small bowel in the lower
pelvis, similar to the prior, without evidence of small bowel
obstruction.

Status post cholecystectomy, appendectomy, and hysterectomy.

## 2018-08-19 MED ORDER — IOPAMIDOL (ISOVUE-300) INJECTION 61%
80.0000 mL | Freq: Once | INTRAVENOUS | Status: AC | PRN
  Administered 2018-08-19: 80 mL via INTRAVENOUS

## 2018-08-20 ENCOUNTER — Telehealth: Payer: Self-pay | Admitting: Gastroenterology

## 2018-08-23 NOTE — Telephone Encounter (Signed)
Pt is calling about her CT scan results and requesting call back

## 2018-08-23 NOTE — Telephone Encounter (Signed)
Let patient know that Dr. Loletha Carrow has not had a chance to review her results but once he has she will be contacted.

## 2018-08-27 NOTE — Telephone Encounter (Signed)
Message sent to pt that Dr Silvio Pate is out of the office until Tuesday. Need to see if she wants to wait on him or have another provider address.

## 2018-09-07 ENCOUNTER — Ambulatory Visit (AMBULATORY_SURGERY_CENTER): Payer: Self-pay

## 2018-09-07 VITALS — Ht 62.5 in | Wt 94.8 lb

## 2018-09-07 DIAGNOSIS — R1084 Generalized abdominal pain: Secondary | ICD-10-CM

## 2018-09-07 DIAGNOSIS — L821 Other seborrheic keratosis: Secondary | ICD-10-CM | POA: Diagnosis not present

## 2018-09-07 DIAGNOSIS — L900 Lichen sclerosus et atrophicus: Secondary | ICD-10-CM | POA: Diagnosis not present

## 2018-09-07 DIAGNOSIS — D225 Melanocytic nevi of trunk: Secondary | ICD-10-CM | POA: Diagnosis not present

## 2018-09-07 DIAGNOSIS — D1800 Hemangioma unspecified site: Secondary | ICD-10-CM | POA: Diagnosis not present

## 2018-09-07 MED ORDER — PEG-KCL-NACL-NASULF-NA ASC-C 140 G PO SOLR: 1.0000 | Freq: Once | ORAL | Status: AC

## 2018-09-07 NOTE — Progress Notes (Signed)
Per pt, no allergies to soy or egg products.Pt not taking any weight loss meds or using  O2 at home.  Pt refused emmi video.  Plenvu sample given to pt at [facility]. Lot #[id number],Exp 2020

## 2018-09-09 ENCOUNTER — Ambulatory Visit (INDEPENDENT_AMBULATORY_CARE_PROVIDER_SITE_OTHER): Payer: Medicare HMO

## 2018-09-09 DIAGNOSIS — Z23 Encounter for immunization: Secondary | ICD-10-CM | POA: Diagnosis not present

## 2018-09-13 ENCOUNTER — Encounter: Payer: Self-pay | Admitting: Gastroenterology

## 2018-09-21 ENCOUNTER — Ambulatory Visit: Payer: Medicare HMO | Admitting: Gastroenterology

## 2018-09-27 ENCOUNTER — Ambulatory Visit (AMBULATORY_SURGERY_CENTER): Payer: Medicare HMO | Admitting: Gastroenterology

## 2018-09-27 ENCOUNTER — Telehealth: Payer: Self-pay | Admitting: Gastroenterology

## 2018-09-27 ENCOUNTER — Encounter: Payer: Self-pay | Admitting: Gastroenterology

## 2018-09-27 VITALS — BP 168/87 | HR 92 | Temp 97.3°F | Resp 19 | Ht 62.0 in | Wt 98.0 lb

## 2018-09-27 DIAGNOSIS — R1084 Generalized abdominal pain: Secondary | ICD-10-CM

## 2018-09-27 DIAGNOSIS — R194 Change in bowel habit: Secondary | ICD-10-CM | POA: Diagnosis not present

## 2018-09-27 DIAGNOSIS — R634 Abnormal weight loss: Secondary | ICD-10-CM

## 2018-09-27 DIAGNOSIS — K648 Other hemorrhoids: Secondary | ICD-10-CM | POA: Diagnosis not present

## 2018-09-27 DIAGNOSIS — R109 Unspecified abdominal pain: Secondary | ICD-10-CM | POA: Diagnosis not present

## 2018-09-27 MED ORDER — SODIUM CHLORIDE 0.9 % IV SOLN: 500.0000 mL | Freq: Once | INTRAVENOUS | Status: DC

## 2018-09-27 NOTE — Patient Instructions (Signed)
*  handout on hemorrhoids given*  YOU HAD AN ENDOSCOPIC PROCEDURE TODAY AT [facility]:   Refer to the procedure report that was given to you for any specific questions about what was found during the examination.  If the procedure report does not answer your questions, please call your gastroenterologist to clarify.  If you requested that your care partner not be given the details of your procedure findings, then the procedure report has been included in a sealed envelope for you to review at your convenience later.  YOU SHOULD EXPECT: Some feelings of bloating in the abdomen. Passage of more gas than usual.  Walking can help get rid of the air that was put into your GI tract during the procedure and reduce the bloating. If you had a lower endoscopy (such as a colonoscopy or flexible sigmoidoscopy) you may notice spotting of blood in your stool or on the toilet paper. If you underwent a bowel prep for your procedure, you may not have a normal bowel movement for a few days.  Please Note:  You might notice some irritation and congestion in your nose or some drainage.  This is from the oxygen used during your procedure.  There is no need for concern and it should clear up in a day or so.  SYMPTOMS TO REPORT IMMEDIATELY:   Following lower endoscopy (colonoscopy or flexible sigmoidoscopy):  Excessive amounts of blood in the stool  Significant tenderness or worsening of abdominal pains  Swelling of the abdomen that is new, acute  Fever of 100F or higher   For urgent or emergent issues, a gastroenterologist can be reached at any hour by calling [phone].   DIET:  We do recommend a small meal at first, but then you may proceed to your regular diet.  Drink plenty of fluids but you should avoid alcoholic beverages for 24 hours.  ACTIVITY:  You should plan to take it easy for the rest of today and you should NOT DRIVE or use heavy machinery until tomorrow (because of the sedation  medicines used during the test).    FOLLOW UP: Our staff will call the number listed on your records the next business day following your procedure to check on you and address any questions or concerns that you may have regarding the information given to you following your procedure. If we do not reach you, we will leave a message.  However, if you are feeling well and you are not experiencing any problems, there is no need to return our call.  We will assume that you have returned to your regular daily activities without incident.  If any biopsies were taken you will be contacted by phone or by letter within the next 1-3 weeks.  Please call us at [phone] if you have not heard about the biopsies in 3 weeks.    SIGNATURES/CONFIDENTIALITY: You and/or your care partner have signed paperwork which will be entered into your electronic medical record.  These signatures attest to the fact that that the information above on your After Visit Summary has been reviewed and is understood.  Full responsibility of the confidentiality of this discharge information lies with you and/or your care-partner.

## 2018-09-27 NOTE — Telephone Encounter (Signed)
Please send a referral to general surgery for abdominal pain, ? Intermittent partial bowel obstruction on CT scan. Send my last 2 office notes, recent CTAP report and 10/21 colonoscopy report

## 2018-09-27 NOTE — Progress Notes (Signed)
Pt's states no medical or surgical changes since previsit or office visit. 

## 2018-09-27 NOTE — Progress Notes (Signed)
A/ox3 pleased with MAC, report to RN 

## 2018-09-27 NOTE — Op Note (Signed)
[facility] Patient Name: [name] Procedure Date: 09/27/2018 4:14 PM MRN: [id number] Endoscopist: Mallie Mussel L. Loletha Carrow , MD Age: 77 Referring MD:  Date of Birth: 30-Jun-1941 Gender: Female Account #: [id number] Procedure:                Colonoscopy Indications:              Generalized abdominal pain (chronic, episodically                            worse), Altered bowel habits, weight loss Medicines:                Monitored Anesthesia Care Procedure:                Pre-Anesthesia Assessment:                           - Prior to the procedure, a History and Physical                            was performed, and patient medications and                            allergies were reviewed. The patient's tolerance of                            previous anesthesia was also reviewed. The risks                            and benefits of the procedure and the sedation                            options and risks were discussed with the patient.                            All questions were answered, and informed consent                            was obtained. Prior Anticoagulants: The patient has                            taken no previous anticoagulant or antiplatelet                            agents. ASA Grade Assessment: II - A patient with                            mild systemic disease. After reviewing the risks                            and benefits, the patient was deemed in                            satisfactory condition to undergo the procedure.  After obtaining informed consent, the colonoscope                            was passed under direct vision. Throughout the                            procedure, the patient's blood pressure, pulse, and                            oxygen saturations were monitored continuously. The                            Colonoscope was introduced through the anus and                            advanced to the the  terminal ileum, with                            identification of the appendiceal orifice and IC                            valve. The colonoscopy was somewhat difficult due                            to a tortuous colon. Successful completion of the                            procedure was aided by changing the patient to a                            supine position and using manual pressure. The                            patient tolerated the procedure well. The quality                            of the bowel preparation was excellent. The                            terminal ileum, ileocecal valve, appendiceal                            orifice, and rectum were photographed. Scope In: 4:22:22 PM Scope Out: 4:36:47 PM Scope Withdrawal Time: 0 hours 6 minutes 57 seconds  Total Procedure Duration: 0 hours 14 minutes 25 seconds  Findings:                 The digital rectal exam findings include decreased                            sphincter tone.                           The terminal ileum appeared normal.  The sigmoid colon was tortuous.                           Internal hemorrhoids were found. The hemorrhoids                            were medium-sized.                           The exam was otherwise without abnormality on                            direct and retroflexion views. Complications:            No immediate complications. Estimated Blood Loss:     Estimated blood loss: none. Impression:               - Decreased sphincter tone found on digital rectal                            exam.                           - The examined portion of the ileum was normal.                           - Tortuous colon.                           - Internal hemorrhoids.                           - The examination was otherwise normal on direct                            and retroflexion views.                           - No specimens collected. Recommendation:            - Patient has a contact number available for                            emergencies. The signs and symptoms of potential                            delayed complications were discussed with the                            patient. Return to normal activities tomorrow.                            Written discharge instructions were provided to the                            patient.                           -  Resume previous diet.                           - Continue present medications.                           - Refer to a surgeon at appointment to be scheduled                            to consider laparoscopy for concern of adhesions                            based on symptoms and recent CTAP findings of                            dilated small bowel loops in pelvis.                           - No recommendation at this time regarding repeat                            colonoscopy. Henry L. Loletha Carrow, MD 09/27/2018 4:44:49 PM This report has been signed electronically.

## 2018-09-28 ENCOUNTER — Telehealth: Payer: Self-pay | Admitting: *Deleted

## 2018-09-28 NOTE — Telephone Encounter (Signed)
  Follow up Call-  Call back number 09/27/2018  Post procedure Call Back phone  # [phone]  Permission to leave phone message Yes  Some recent data might be hidden     Patient questions:  Do you have a fever, pain , or abdominal swelling? No. Pain Score  0 *  Have you tolerated food without any problems? Yes.    Have you been able to return to your normal activities? Yes.    Do you have any questions about your discharge instructions: Diet   No. Medications  No. Follow up visit  No.  Do you have questions or concerns about your Care? No.  Actions: * If pain score is 4 or above: No action needed, pain <4.

## 2018-09-28 NOTE — Telephone Encounter (Signed)
Referral sent to [facility] for abdominal pain, question of intermittent bowel obstruction on CT.

## 2018-10-06 ENCOUNTER — Telehealth: Payer: Self-pay | Admitting: Gastroenterology

## 2018-10-06 DIAGNOSIS — Z681 Body mass index (BMI) 19 or less, adult: Secondary | ICD-10-CM | POA: Diagnosis not present

## 2018-10-06 DIAGNOSIS — R636 Underweight: Secondary | ICD-10-CM | POA: Diagnosis not present

## 2018-10-06 DIAGNOSIS — Z8249 Family history of ischemic heart disease and other diseases of the circulatory system: Secondary | ICD-10-CM | POA: Diagnosis not present

## 2018-10-06 DIAGNOSIS — Z880 Allergy status to penicillin: Secondary | ICD-10-CM | POA: Diagnosis not present

## 2018-10-06 DIAGNOSIS — E785 Hyperlipidemia, unspecified: Secondary | ICD-10-CM | POA: Diagnosis not present

## 2018-10-06 DIAGNOSIS — Z803 Family history of malignant neoplasm of breast: Secondary | ICD-10-CM | POA: Diagnosis not present

## 2018-10-06 NOTE — Telephone Encounter (Signed)
Let patient know that the referral was faxed to [facility] and sent through [place] on 10/22. I gave her the information to contact their office to inquire about appointment.

## 2018-10-06 NOTE — Telephone Encounter (Signed)
Patient wants status of referral to surgeon

## 2018-10-13 DIAGNOSIS — K56600 Partial intestinal obstruction, unspecified as to cause: Secondary | ICD-10-CM | POA: Diagnosis not present

## 2018-10-22 ENCOUNTER — Ambulatory Visit: Payer: Self-pay | Admitting: General Surgery

## 2018-11-19 NOTE — Patient Instructions (Addendum)
[name]  11/19/2018   Your procedure is scheduled on: Thursday 11/25/2018  Report to [facility] Main  Entrance              Report to admitting at  0530  AM    Call this number if you have problems the morning of surgery [phone]    Remember: Do not eat food  :After Midnight.  NO SOLID FOOD AFTER MIDNIGHT THE NIGHT PRIOR TO SURGERY. NOTHING BY MOUTH EXCEPT CLEAR LIQUIDS UNTIL 3 HOURS PRIOR TO [place] SURGERY. PLEASE FINISH ENSURE DRINK PER SURGEON ORDER 3 HOURS PRIOR TO SCHEDULED SURGERY TIME WHICH NEEDS TO BE COMPLETED AT  0430 am.     CLEAR LIQUID DIET   Foods Allowed                                                                     Foods Excluded  Coffee and tea, regular and decaf                             liquids that you cannot  Plain Jell-O in any flavor                                             see through such as: Fruit ices (not with fruit pulp)                                     milk, soups, orange juice  Iced Popsicles                                    All solid food Carbonated beverages, regular and diet                                    Cranberry, grape and apple juices Sports drinks like Gatorade Lightly seasoned clear broth or consume(fat free) Sugar, honey syrup  Sample Menu Breakfast                                Lunch                                     Supper Cranberry juice                    Beef broth                            Chicken broth Jell-O  Grape juice                           Apple juice Coffee or tea                        Jell-O                                      Popsicle                                                Coffee or tea                        Coffee or tea  _____________________________________________________________________               BRUSH YOUR TEETH MORNING OF SURGERY AND RINSE YOUR MOUTH OUT, NO CHEWING GUM CANDY OR MINTS.     Take  these medicines the morning of surgery with A SIP OF WATER:none                                You may not have any metal on your body including hair pins and              piercings  Do not wear jewelry, make-up, lotions, powders or perfumes, deodorant             Do not wear nail polish.  Do not shave  48 hours prior to surgery.             Do not bring valuables to the hospital. [place].  Contacts, dentures or bridgework may not be worn into surgery.  Leave suitcase in the car. After surgery it may be brought to your room.                  Please read over the following fact sheets you were given: _____________________________________________________________________             [facility] - Preparing for Surgery Before surgery, you can play an important role.  Because skin is not sterile, your skin needs to be as free of germs as possible.  You can reduce the number of germs on your skin by washing with CHG (chlorahexidine gluconate) soap before surgery.  CHG is an antiseptic cleaner which kills germs and bonds with the skin to continue killing germs even after washing. Please DO NOT use if you have an allergy to CHG or antibacterial soaps.  If your skin becomes reddened/irritated stop using the CHG and inform your nurse when you arrive at Short Stay. Do not shave (including legs and underarms) for at least 48 hours prior to the first CHG shower.  You may shave your face/neck. Please follow these instructions carefully:  1.  Shower with CHG Soap the night before surgery and the  morning of Surgery.  2.  If you choose to wash your hair, wash your hair first as usual with your  normal  shampoo.  3.  After you shampoo, rinse your hair and body thoroughly to remove the  shampoo.                           4.  Use CHG as you would any other liquid soap.  You can apply chg directly  to the skin and wash                       Gently with a  scrungie or clean washcloth.  5.  Apply the CHG Soap to your body ONLY FROM THE NECK DOWN.   Do not use on face/ open                           Wound or open sores. Avoid contact with eyes, ears mouth and genitals (private parts).                       Wash face,  Genitals (private parts) with your normal soap.             6.  Wash thoroughly, paying special attention to the area where your surgery  will be performed.  7.  Thoroughly rinse your body with warm water from the neck down.  8.  DO NOT shower/wash with your normal soap after using and rinsing off  the CHG Soap.                9.  Pat yourself dry with a clean towel.            10.  Wear clean pajamas.            11.  Place clean sheets on your bed the night of your first shower and do not  sleep with pets. Day of Surgery : Do not apply any lotions/deodorants the morning of surgery.  Please wear clean clothes to the hospital/surgery center.  FAILURE TO FOLLOW THESE INSTRUCTIONS MAY RESULT IN THE CANCELLATION OF YOUR SURGERY PATIENT SIGNATURE_________________________________  NURSE SIGNATURE__________________________________  ________________________________________________________________________

## 2018-11-22 ENCOUNTER — Encounter (HOSPITAL_COMMUNITY): Payer: Self-pay

## 2018-11-22 ENCOUNTER — Other Ambulatory Visit: Payer: Self-pay

## 2018-11-22 ENCOUNTER — Encounter (HOSPITAL_COMMUNITY)
Admission: RE | Admit: 2018-11-22 | Discharge: 2018-11-22 | Disposition: A | Discharge status: Home / Self Care | Payer: Medicare HMO | Source: Ambulatory Visit | Attending: General Surgery | Admitting: General Surgery

## 2018-11-22 DIAGNOSIS — R69 Illness, unspecified: Secondary | ICD-10-CM | POA: Diagnosis not present

## 2018-11-22 DIAGNOSIS — Z01818 Encounter for other preprocedural examination: Secondary | ICD-10-CM

## 2018-11-22 DIAGNOSIS — Z79899 Other long term (current) drug therapy: Secondary | ICD-10-CM | POA: Diagnosis not present

## 2018-11-22 DIAGNOSIS — K565 Intestinal adhesions [bands], unspecified as to partial versus complete obstruction: Secondary | ICD-10-CM | POA: Diagnosis not present

## 2018-11-22 DIAGNOSIS — Z880 Allergy status to penicillin: Secondary | ICD-10-CM | POA: Diagnosis not present

## 2018-11-22 DIAGNOSIS — I447 Left bundle-branch block, unspecified: Secondary | ICD-10-CM

## 2018-11-22 DIAGNOSIS — Z888 Allergy status to other drugs, medicaments and biological substances status: Secondary | ICD-10-CM | POA: Diagnosis not present

## 2018-11-22 DIAGNOSIS — H9193 Unspecified hearing loss, bilateral: Secondary | ICD-10-CM | POA: Diagnosis not present

## 2018-11-22 DIAGNOSIS — E785 Hyperlipidemia, unspecified: Secondary | ICD-10-CM | POA: Diagnosis not present

## 2018-11-22 DIAGNOSIS — K219 Gastro-esophageal reflux disease without esophagitis: Secondary | ICD-10-CM | POA: Diagnosis not present

## 2018-11-22 HISTORY — DX: Unspecified hearing loss, unspecified ear: H91.90

## 2018-11-22 LAB — CBC WITH DIFFERENTIAL/PLATELET
Abs Immature Granulocytes: 0.02 10*3/uL (ref 0.00–0.07)
Basophils Absolute: 0 10*3/uL (ref 0.0–0.1)
Basophils Relative: 1 %
Eosinophils Absolute: 0.2 10*3/uL (ref 0.0–0.5)
Eosinophils Relative: 3 %
HCT: 42.7 % (ref 36.0–46.0)
Hemoglobin: 13.9 g/dL (ref 12.0–15.0)
Immature Granulocytes: 0 %
Lymphocytes Relative: 30 %
Lymphs Abs: 1.8 10*3/uL (ref 0.7–4.0)
MCH: 30.9 pg (ref 26.0–34.0)
MCHC: 32.6 g/dL (ref 30.0–36.0)
MCV: 94.9 fL (ref 80.0–100.0)
MONO ABS: 0.4 10*3/uL (ref 0.1–1.0)
Monocytes Relative: 7 %
Neutro Abs: 3.5 10*3/uL (ref 1.7–7.7)
Neutrophils Relative %: 59 %
Platelets: 234 10*3/uL (ref 150–400)
RBC: 4.5 MIL/uL (ref 3.87–5.11)
RDW: 11.8 % (ref 11.5–15.5)
WBC: 5.8 10*3/uL (ref 4.0–10.5)
nRBC: 0 % (ref 0.0–0.2)

## 2018-11-22 LAB — BASIC METABOLIC PANEL
Anion gap: 10 (ref 5–15)
BUN: 7 mg/dL — ABNORMAL LOW (ref 8–23)
CALCIUM: 9 mg/dL (ref 8.9–10.3)
CO2: 26 mmol/L (ref 22–32)
Chloride: 102 mmol/L (ref 98–111)
Creatinine, Ser: 0.67 mg/dL (ref 0.44–1.00)
GFR calc Af Amer: >60 mL/min (ref >60)
GFR calc non Af Amer: >60 mL/min (ref >60)
Glucose, Bld: 98 mg/dL (ref 70–99)
Potassium: 4.5 mmol/L (ref 3.5–5.1)
Sodium: 138 mmol/L (ref 135–145)

## 2018-11-22 MED ORDER — ENSURE PRE-SURGERY PO LIQD
296.0000 mL | Freq: Once | ORAL | Status: DC
  Filled 2018-11-22: qty 296

## 2018-11-23 DIAGNOSIS — H2513 Age-related nuclear cataract, bilateral: Secondary | ICD-10-CM | POA: Diagnosis not present

## 2018-11-24 MED ORDER — GENTAMICIN SULFATE 40 MG/ML IJ SOLN
200.0000 mg | INTRAVENOUS | Status: AC
  Administered 2018-11-25: 200 mg via INTRAVENOUS
  Filled 2018-11-24: qty 5

## 2018-11-24 NOTE — Anesthesia Preprocedure Evaluation (Addendum)
Anesthesia Evaluation  Patient identified by MRN, date of birth, ID band Patient awake    Reviewed: Allergy & Precautions, H&P , NPO status , Patient's Chart, lab work & pertinent test results  Airway Mallampati: II  TM Distance: >3 FB Neck ROM: Full    Dental no notable dental hx. (+) Teeth Intact, Dental Advisory Given   Pulmonary neg pulmonary ROS,    Pulmonary exam normal breath sounds clear to auscultation       Cardiovascular Exercise Tolerance: Good + dysrhythmias  Rhythm:Regular Rate:Normal     Neuro/Psych  Headaches, Anxiety    GI/Hepatic negative GI ROS, Neg liver ROS, GERD  Controlled,  Endo/Other  negative endocrine ROS  Renal/GU negative Renal ROS  negative genitourinary   Musculoskeletal  (+) Arthritis , Osteoarthritis,    Abdominal   Peds  Hematology negative hematology ROS (+)   Anesthesia Other Findings   Reproductive/Obstetrics negative OB ROS                            Anesthesia Physical Anesthesia Plan  ASA: II  Anesthesia Plan: General   Post-op Pain Management:    Induction: Intravenous  PONV Risk Score and Plan: 4 or greater and Ondansetron, Dexamethasone and Treatment may vary due to age or medical condition  Airway Management Planned: Oral ETT  Additional Equipment:   Intra-op Plan:   Post-operative Plan: Extubation in OR  Informed Consent: I have reviewed the patients History and Physical, chart, labs and discussed the procedure including the risks, benefits and alternatives for the proposed anesthesia with the patient or authorized representative who has indicated his/her understanding and acceptance.   Dental advisory given  Plan Discussed with: CRNA  Anesthesia Plan Comments:         Anesthesia Quick Evaluation

## 2018-11-25 ENCOUNTER — Encounter (HOSPITAL_COMMUNITY): Payer: Self-pay | Admitting: Emergency Medicine

## 2018-11-25 ENCOUNTER — Inpatient Hospital Stay (HOSPITAL_COMMUNITY): Payer: Medicare HMO | Admitting: Anesthesiology

## 2018-11-25 ENCOUNTER — Encounter (HOSPITAL_COMMUNITY)
Admission: RE | Disposition: A | Discharge status: Home / Self Care | Payer: Self-pay | Source: Home / Self Care | Attending: General Surgery

## 2018-11-25 ENCOUNTER — Other Ambulatory Visit: Payer: Self-pay

## 2018-11-25 ENCOUNTER — Inpatient Hospital Stay (HOSPITAL_COMMUNITY)
Admission: RE | Admit: 2018-11-25 | Discharge: 2018-11-26 | DRG: 337 | Disposition: A | Discharge status: Home / Self Care | Payer: Medicare HMO | Attending: General Surgery | Admitting: General Surgery

## 2018-11-25 DIAGNOSIS — E785 Hyperlipidemia, unspecified: Secondary | ICD-10-CM | POA: Diagnosis present

## 2018-11-25 DIAGNOSIS — Z79899 Other long term (current) drug therapy: Secondary | ICD-10-CM | POA: Diagnosis not present

## 2018-11-25 DIAGNOSIS — K5090 Crohn's disease, unspecified, without complications: Secondary | ICD-10-CM | POA: Diagnosis not present

## 2018-11-25 DIAGNOSIS — K56699 Other intestinal obstruction unspecified as to partial versus complete obstruction: Secondary | ICD-10-CM | POA: Diagnosis present

## 2018-11-25 DIAGNOSIS — K660 Peritoneal adhesions (postprocedural) (postinfection): Secondary | ICD-10-CM | POA: Diagnosis not present

## 2018-11-25 DIAGNOSIS — H9193 Unspecified hearing loss, bilateral: Secondary | ICD-10-CM | POA: Diagnosis present

## 2018-11-25 DIAGNOSIS — Z888 Allergy status to other drugs, medicaments and biological substances status: Secondary | ICD-10-CM | POA: Diagnosis not present

## 2018-11-25 DIAGNOSIS — R69 Illness, unspecified: Secondary | ICD-10-CM | POA: Diagnosis not present

## 2018-11-25 DIAGNOSIS — K565 Intestinal adhesions [bands], unspecified as to partial versus complete obstruction: Secondary | ICD-10-CM | POA: Diagnosis not present

## 2018-11-25 DIAGNOSIS — Z880 Allergy status to penicillin: Secondary | ICD-10-CM | POA: Diagnosis not present

## 2018-11-25 DIAGNOSIS — F419 Anxiety disorder, unspecified: Secondary | ICD-10-CM | POA: Diagnosis present

## 2018-11-25 DIAGNOSIS — R109 Unspecified abdominal pain: Secondary | ICD-10-CM | POA: Diagnosis present

## 2018-11-25 DIAGNOSIS — K219 Gastro-esophageal reflux disease without esophagitis: Secondary | ICD-10-CM | POA: Diagnosis not present

## 2018-11-25 HISTORY — PX: DIAGNOSTIC LAPAROSCOPY: SUR761

## 2018-11-25 HISTORY — PX: LAPAROSCOPY: SHX197

## 2018-11-25 LAB — CBC
HCT: 37.8 % (ref 36.0–46.0)
Hemoglobin: 12.4 g/dL (ref 12.0–15.0)
MCH: 30.9 pg (ref 26.0–34.0)
MCHC: 32.8 g/dL (ref 30.0–36.0)
MCV: 94.3 fL (ref 80.0–100.0)
NRBC: 0 % (ref 0.0–0.2)
Platelets: 226 10*3/uL (ref 150–400)
RBC: 4.01 MIL/uL (ref 3.87–5.11)
RDW: 11.8 % (ref 11.5–15.5)
WBC: 7.1 10*3/uL (ref 4.0–10.5)

## 2018-11-25 LAB — CREATININE, SERUM
Creatinine, Ser: 0.67 mg/dL (ref 0.44–1.00)
GFR calc Af Amer: >60 mL/min (ref >60)
GFR calc non Af Amer: >60 mL/min (ref >60)

## 2018-11-25 SURGERY — LAPAROSCOPY, DIAGNOSTIC
Anesthesia: General

## 2018-11-25 MED ORDER — PROPOFOL 10 MG/ML IV BOLUS
INTRAVENOUS | Status: DC | PRN
  Administered 2018-11-25: 70 mg via INTRAVENOUS

## 2018-11-25 MED ORDER — ONDANSETRON HCL 4 MG/2ML IJ SOLN
INTRAMUSCULAR | Status: AC
  Filled 2018-11-25: qty 2

## 2018-11-25 MED ORDER — DEXTROSE-NACL 5-0.45 % IV SOLN
INTRAVENOUS | Status: DC
  Administered 2018-11-25 – 2018-11-26 (×3): via INTRAVENOUS

## 2018-11-25 MED ORDER — POTASSIUM GLUCONATE 595 (99 K) MG PO TABS
595.0000 mg | ORAL_TABLET | ORAL | Status: DC
  Administered 2018-11-26: 595 mg via ORAL
  Filled 2018-11-25: qty 1

## 2018-11-25 MED ORDER — HYDROCODONE-ACETAMINOPHEN 5-325 MG PO TABS: 1.0000 | ORAL_TABLET | ORAL | Status: DC | PRN

## 2018-11-25 MED ORDER — SIMETHICONE 80 MG PO CHEW: 40.0000 mg | CHEWABLE_TABLET | Freq: Four times a day (QID) | ORAL | Status: DC | PRN

## 2018-11-25 MED ORDER — PHENYLEPHRINE HCL 10 MG/ML IJ SOLN
INTRAMUSCULAR | Status: DC | PRN
  Administered 2018-11-25 (×2): 80 ug via INTRAVENOUS

## 2018-11-25 MED ORDER — ONDANSETRON HCL 4 MG/2ML IJ SOLN
4.0000 mg | Freq: Four times a day (QID) | INTRAMUSCULAR | Status: DC | PRN
  Administered 2018-11-25: 4 mg via INTRAVENOUS
  Filled 2018-11-25: qty 2

## 2018-11-25 MED ORDER — FENTANYL CITRATE (PF) 100 MCG/2ML IJ SOLN
INTRAMUSCULAR | Status: DC | PRN
  Administered 2018-11-25: 50 ug via INTRAVENOUS
  Administered 2018-11-25 (×4): 25 ug via INTRAVENOUS
  Administered 2018-11-25: 50 ug via INTRAVENOUS

## 2018-11-25 MED ORDER — CILIDINIUM-CHLORDIAZEPOXIDE 2.5-5 MG PO CAPS
1.0000 | ORAL_CAPSULE | Freq: Two times a day (BID) | ORAL | Status: DC | PRN
  Filled 2018-11-25: qty 1

## 2018-11-25 MED ORDER — 0.9 % SODIUM CHLORIDE (POUR BTL) OPTIME
TOPICAL | Status: DC | PRN
  Administered 2018-11-25: 2000 mL

## 2018-11-25 MED ORDER — EPHEDRINE SULFATE 50 MG/ML IJ SOLN
INTRAMUSCULAR | Status: DC | PRN
  Administered 2018-11-25: 10 mg via INTRAVENOUS

## 2018-11-25 MED ORDER — DIMENHYDRINATE 50 MG PO TABS
50.0000 mg | ORAL_TABLET | Freq: Every evening | ORAL | Status: DC | PRN
  Filled 2018-11-25: qty 1

## 2018-11-25 MED ORDER — BUPIVACAINE-EPINEPHRINE 0.25% -1:200000 IJ SOLN
INTRAMUSCULAR | Status: DC | PRN
  Administered 2018-11-25: 30 mL

## 2018-11-25 MED ORDER — LACTATED RINGERS IV SOLN
INTRAVENOUS | Status: DC | PRN
  Administered 2018-11-25: 07:00:00 via INTRAVENOUS

## 2018-11-25 MED ORDER — MORPHINE SULFATE (PF) 2 MG/ML IV SOLN: 2.0000 mg | INTRAVENOUS | Status: DC | PRN

## 2018-11-25 MED ORDER — DIPHENHYDRAMINE HCL 25 MG PO CAPS: 50.0000 mg | ORAL_CAPSULE | Freq: Every evening | ORAL | Status: DC | PRN

## 2018-11-25 MED ORDER — BUPIVACAINE-EPINEPHRINE (PF) 0.25% -1:200000 IJ SOLN
INTRAMUSCULAR | Status: AC
  Filled 2018-11-25: qty 30

## 2018-11-25 MED ORDER — DEXAMETHASONE SODIUM PHOSPHATE 10 MG/ML IJ SOLN
INTRAMUSCULAR | Status: DC | PRN
  Administered 2018-11-25: 8 mg via INTRAVENOUS

## 2018-11-25 MED ORDER — CLINDAMYCIN PHOSPHATE 900 MG/50ML IV SOLN
900.0000 mg | INTRAVENOUS | Status: AC
  Administered 2018-11-25: 900 mg via INTRAVENOUS
  Filled 2018-11-25: qty 50

## 2018-11-25 MED ORDER — POLYETHYLENE GLYCOL 3350 17 G PO PACK: 17.0000 g | PACK | Freq: Every day | ORAL | Status: DC | PRN

## 2018-11-25 MED ORDER — SUGAMMADEX SODIUM 200 MG/2ML IV SOLN
INTRAVENOUS | Status: DC | PRN
  Administered 2018-11-25: 175 mg via INTRAVENOUS

## 2018-11-25 MED ORDER — LIDOCAINE HCL 2 % IJ SOLN
INTRAMUSCULAR | Status: AC
  Filled 2018-11-25: qty 20

## 2018-11-25 MED ORDER — LIDOCAINE 20MG/ML (2%) 15 ML SYRINGE OPTIME
INTRAMUSCULAR | Status: DC | PRN
  Administered 2018-11-25 (×2): 1 mg/kg/h via INTRAVENOUS

## 2018-11-25 MED ORDER — PROPOFOL 10 MG/ML IV BOLUS
INTRAVENOUS | Status: AC
  Filled 2018-11-25: qty 20

## 2018-11-25 MED ORDER — FENTANYL CITRATE (PF) 250 MCG/5ML IJ SOLN
INTRAMUSCULAR | Status: AC
  Filled 2018-11-25: qty 5

## 2018-11-25 MED ORDER — DOCUSATE SODIUM 100 MG PO CAPS
100.0000 mg | ORAL_CAPSULE | Freq: Every day | ORAL | Status: DC
  Administered 2018-11-26: 100 mg via ORAL
  Filled 2018-11-25: qty 1

## 2018-11-25 MED ORDER — VITAMIN B-12 1000 MCG PO TABS
1000.0000 ug | ORAL_TABLET | Freq: Every day | ORAL | Status: DC
  Administered 2018-11-26: 1000 ug via ORAL
  Filled 2018-11-25: qty 1

## 2018-11-25 MED ORDER — ENOXAPARIN SODIUM 30 MG/0.3ML ~~LOC~~ SOLN
30.0000 mg | SUBCUTANEOUS | Status: DC
  Administered 2018-11-26: 30 mg via SUBCUTANEOUS
  Filled 2018-11-25: qty 0.3

## 2018-11-25 MED ORDER — ROCURONIUM BROMIDE 100 MG/10ML IV SOLN
INTRAVENOUS | Status: DC | PRN
  Administered 2018-11-25: 40 mg via INTRAVENOUS

## 2018-11-25 MED ORDER — LACTATED RINGERS IR SOLN
Status: DC | PRN
  Administered 2018-11-25: 1000 mL

## 2018-11-25 MED ORDER — ONDANSETRON 4 MG PO TBDP: 4.0000 mg | ORAL_TABLET | Freq: Four times a day (QID) | ORAL | Status: DC | PRN

## 2018-11-25 MED ORDER — ONDANSETRON HCL 4 MG/2ML IJ SOLN
INTRAMUSCULAR | Status: DC | PRN
  Administered 2018-11-25: 4 mg via INTRAVENOUS

## 2018-11-25 MED ORDER — ACETAMINOPHEN 500 MG PO TABS: 1000.0000 mg | ORAL_TABLET | Freq: Four times a day (QID) | ORAL | Status: DC | PRN

## 2018-11-25 MED ORDER — PHENOL 1.4 % MT LIQD
1.0000 | OROMUCOSAL | Status: DC | PRN
  Administered 2018-11-25: 1 via OROMUCOSAL
  Filled 2018-11-25: qty 177

## 2018-11-25 MED ORDER — LABETALOL HCL 5 MG/ML IV SOLN
INTRAVENOUS | Status: DC | PRN
  Administered 2018-11-25: 5 mg via INTRAVENOUS

## 2018-11-25 MED ORDER — CHLORHEXIDINE GLUCONATE CLOTH 2 % EX PADS: 6.0000 | MEDICATED_PAD | Freq: Once | CUTANEOUS | Status: DC

## 2018-11-25 MED ORDER — PRAVASTATIN SODIUM 20 MG PO TABS
20.0000 mg | ORAL_TABLET | Freq: Every day | ORAL | Status: DC
  Administered 2018-11-25: 20 mg via ORAL
  Filled 2018-11-25: qty 1

## 2018-11-25 MED ORDER — HYDRALAZINE HCL 20 MG/ML IJ SOLN: 10.0000 mg | INTRAMUSCULAR | Status: DC | PRN

## 2018-11-25 MED ORDER — CARBAMIDE PEROXIDE 6.5 % OT SOLN: 5.0000 [drp] | Freq: Every day | OTIC | Status: DC | PRN

## 2018-11-25 MED ORDER — ACETAMINOPHEN 500 MG PO TABS
1000.0000 mg | ORAL_TABLET | ORAL | Status: AC
  Administered 2018-11-25: 1000 mg via ORAL
  Filled 2018-11-25: qty 2

## 2018-11-25 SURGICAL SUPPLY — 38 items
ADH SKN CLS APL DERMABOND .7 (GAUZE/BANDAGES/DRESSINGS) ×1
COVER SURGICAL LIGHT HANDLE (MISCELLANEOUS) ×2 IMPLANT
COVER WAND RF STERILE (DRAPES) ×1 IMPLANT
DECANTER SPIKE VIAL GLASS SM (MISCELLANEOUS) IMPLANT
DERMABOND ADVANCED (GAUZE/BANDAGES/DRESSINGS) ×1
DERMABOND ADVANCED .7 DNX12 (GAUZE/BANDAGES/DRESSINGS) IMPLANT
DRAPE WARM FLUID 44X44 (DRAPE) ×2 IMPLANT
ELECT CAUTERY BLADE 6.4 (BLADE) ×1 IMPLANT
ELECT PENCIL ROCKER SW 15FT (MISCELLANEOUS) ×1 IMPLANT
ELECT REM PT RETURN 15FT ADLT (MISCELLANEOUS) ×2 IMPLANT
GLOVE BIOGEL PI IND STRL 7.0 (GLOVE) ×1 IMPLANT
GLOVE BIOGEL PI INDICATOR 7.0 (GLOVE) ×1
GLOVE SURG SS PI 7.0 STRL IVOR (GLOVE) ×4 IMPLANT
GOWN STRL REUS W/TWL LRG LVL3 (GOWN DISPOSABLE) ×3 IMPLANT
GOWN STRL REUS W/TWL XL LVL3 (GOWN DISPOSABLE) ×3 IMPLANT
HANDLE SUCTION POOLE (INSTRUMENTS) IMPLANT
IRRIG SUCT STRYKERFLOW 2 WTIP (MISCELLANEOUS)
IRRIGATION SUCT STRKRFLW 2 WTP (MISCELLANEOUS) IMPLANT
KIT BASIN OR (CUSTOM PROCEDURE TRAY) ×2 IMPLANT
SCISSORS LAP 5X35 DISP (ENDOMECHANICALS) ×2 IMPLANT
SET IRRIG TUBING LAPAROSCOPIC (IRRIGATION / IRRIGATOR) ×1 IMPLANT
SHEARS HARMONIC ACE PLUS 36CM (ENDOMECHANICALS) IMPLANT
SLEEVE XCEL OPT CAN 5 100 (ENDOMECHANICALS) ×4 IMPLANT
SUCTION POOLE HANDLE (INSTRUMENTS) ×2
SUT MNCRL AB 4-0 PS2 18 (SUTURE) ×2 IMPLANT
SUT SILK 2 0 (SUTURE)
SUT SILK 2 0 SH CR/8 (SUTURE) IMPLANT
SUT SILK 2-0 18XBRD TIE 12 (SUTURE) IMPLANT
SUT SILK 3 0 (SUTURE) ×2
SUT SILK 3 0 SH CR/8 (SUTURE) IMPLANT
SUT SILK 3-0 18XBRD TIE 12 (SUTURE) IMPLANT
TOWEL OR 17X26 10 PK STRL BLUE (TOWEL DISPOSABLE) ×2 IMPLANT
TOWEL OR NON WOVEN STRL DISP B (DISPOSABLE) ×2 IMPLANT
TRAY FOLEY W/BAG SLVR 14FR (SET/KITS/TRAYS/PACK) ×2 IMPLANT
TRAY LAPAROSCOPIC (CUSTOM PROCEDURE TRAY) ×2 IMPLANT
TROCAR BLADELESS OPT 5 100 (ENDOMECHANICALS) ×2 IMPLANT
TROCAR XCEL 12X100 BLDLESS (ENDOMECHANICALS) IMPLANT
TUBING INSUF HEATED (TUBING) ×1 IMPLANT

## 2018-11-25 NOTE — Transfer of Care (Signed)
Immediate Anesthesia Transfer of Care Note  Patient: [name]  Procedure(s) Performed: LAPAROSCOPY DIAGNOSTIC (N/A )  Patient Location: PACU  Anesthesia Type:General  Level of Consciousness: drowsy, patient cooperative and responds to stimulation  Airway & Oxygen Therapy: Patient Spontanous Breathing and Patient connected to face mask oxygen  Post-op Assessment: Report given to RN and Post -op Vital signs reviewed and stable  Post vital signs: Reviewed and stable  Last Vitals:  Vitals Value Taken Time  BP 157/82 11/25/2018  8:38 AM  Temp    Pulse 79 11/25/2018  8:39 AM  Resp 13 11/25/2018  8:39 AM  SpO2 100 % 11/25/2018  8:39 AM  Vitals shown include unvalidated device data.  Last Pain:  Vitals:   11/25/18 0621  TempSrc:   PainSc: 0-No pain      Patients Stated Pain Goal: 4 (79/55/83 1674)  Complications: No apparent anesthesia complications

## 2018-11-25 NOTE — Anesthesia Postprocedure Evaluation (Signed)
Anesthesia Post Note  Patient: [name]  Procedure(s) Performed: LAPAROSCOPY DIAGNOSTIC (N/A )     Patient location during evaluation: PACU Anesthesia Type: General Level of consciousness: awake and alert Pain management: pain level controlled Vital Signs Assessment: post-procedure vital signs reviewed and stable Respiratory status: spontaneous breathing, nonlabored ventilation, respiratory function stable and patient connected to nasal cannula oxygen Cardiovascular status: blood pressure returned to baseline and stable Postop Assessment: no apparent nausea or vomiting Anesthetic complications: no    Last Vitals:  Vitals:   11/25/18 1000 11/25/18 1015  BP: 133/67   Pulse: 64 78  Resp: 20 14  Temp:  (!) 36.2 C  SpO2: 100% 100%    Last Pain:  Vitals:   11/25/18 1015  TempSrc:   PainSc: 0-No pain                 [name],W. EDMOND

## 2018-11-25 NOTE — Anesthesia Procedure Notes (Signed)
Procedure Name: Intubation Date/Time: 11/25/2018 7:28 AM Performed by: Glory Buff, CRNA Pre-anesthesia Checklist: Patient identified, Emergency Drugs available, Suction available and Patient being monitored Patient Re-evaluated:Patient Re-evaluated prior to induction Oxygen Delivery Method: Circle system utilized Preoxygenation: Pre-oxygenation with 100% oxygen Induction Type: IV induction Ventilation: Mask ventilation without difficulty Laryngoscope Size: Miller and 3 Grade View: Grade I Tube type: Oral Tube size: 7.0 mm Number of attempts: 1 Airway Equipment and Method: Stylet and Oral airway Placement Confirmation: ETT inserted through vocal cords under direct vision,  positive ETCO2 and breath sounds checked- equal and bilateral Secured at: 20 cm Tube secured with: Tape Dental Injury: Teeth and Oropharynx as per pre-operative assessment

## 2018-11-25 NOTE — H&P (Signed)
[name] is an 77 y.o. female.   Chief Complaint: abdominal pain HPI: 77 yo female with long history of abdominal pain intermittent and weight loss in the last month. She had a CT scan showing dilation of bowel loops with concern for scar tissue.  Past Medical History:  Diagnosis Date  . Anxiety   . Aortic atherosclerosis ([place])   . GERD (gastroesophageal reflux disease)    Dr. [name]  . H/O abnormal weight loss    lost over 20 lbs in the last year  . 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 History of Bell's palsy 02/2011  . HLD (hyperlipidemia)    Dr. Lia Foyer  . HOH (hard of hearing)    uses hearing aid on left side/complete loss of hearing right side  . IBS (irritable bowel syndrome)   . LBBB (left bundle branch block)   . Migraine   . OA (osteoarthritis)   . Osteopenia 2005  . Ulcer 2003   bleeding stomach ulcer    Past Surgical History:  Procedure Laterality Date  . ABDOMINAL HYSTERECTOMY  1983  . APPENDECTOMY  1983  . CARDIAC CATHETERIZATION  1997   okay, mild changes  . CHOLECYSTECTOMY  1992  . COLONOSCOPY  2003  . DEXA N/A 2005   borderline osteopenia  . TONSILLECTOMY AND ADENOIDECTOMY  1953  . UPPER GASTROINTESTINAL ENDOSCOPY  2003    Family History  Problem Relation Age of Onset  . Heart attack Father 25  . Diabetes Father   . Coronary artery disease Father   . COPD Mother   . Hypertension Mother   . Hypertension Sister   . Pulmonary fibrosis Sister   . Cancer Daughter        breast  . Rheum arthritis Daughter   . Colon cancer Neg Hx   . Esophageal cancer Neg Hx   . Rectal cancer Neg Hx    Social History:  reports that she has never smoked. She has never used smokeless tobacco. She reports that she does not drink alcohol or use drugs.  Allergies:  Allergies  Allergen Reactions  . Clonazepam Swelling and Other (See Comments)    REACTION: Swollen lips, sore mouth  . Penicillins Other (See Comments)    Unrecalled reaction Has patient had  a PCN reaction causing immediate rash, facial/tongue/throat swelling, SOB or lightheadedness with hypotension: Unknown Has patient had a PCN reaction causing severe rash involving mucus membranes or skin necrosis: Unknown Has patient had a PCN reaction that required hospitalization: Unknown Has patient had a PCN reaction occurring within the last 10 years: No If all of the above answers are "NO", then may proceed with Cephalosporin use.     Medications Prior to Admission  Medication Sig Dispense Refill  . acetaminophen (TYLENOL) 500 MG tablet Take 1,000 mg by mouth every 6 (six) hours as needed for moderate pain or headache.    . carbamide peroxide (DEBROX) 6.5 % OTIC solution Place 5 drops into the right ear daily as needed (for wax build up).    . clidinium-chlordiazePOXIDE (LIBRAX) 5-2.5 MG capsule Take 1 capsule by mouth 2 (two) times daily as needed. (Patient taking differently: Take 1 capsule by mouth 2 (two) times daily as needed (for cramps). ) 45 capsule 2  . dimenhyDRINATE (DRAMAMINE) 50 MG tablet Take 50 mg by mouth at bedtime as needed (for sleep).    . docusate sodium (COLACE) 100 MG capsule Take 100 mg by mouth daily.     Marland Kitchen  Potassium Gluconate 595 MG CAPS Take 595 mg by mouth every other day.     . pravastatin (PRAVACHOL) 20 MG tablet Take 1 tablet (20 mg total) by mouth daily. (Patient taking differently: Take 20 mg by mouth at bedtime. ) 90 tablet 3  . vitamin B-12 (CYANOCOBALAMIN) 1000 MCG tablet Take 1,000 mcg by mouth daily.    . propranolol (INDERAL) 40 MG tablet Take 1 tablet (40 mg total) by mouth 2 (two) times daily. (Patient not taking: Reported on 11/16/2018) 180 tablet 3    No results found for this or any previous visit (from the past 48 hour(s)). No results found.  Review of Systems  Constitutional: Negative for chills and fever.  HENT: Negative for hearing loss.   Eyes: Negative for blurred vision and double vision.  Respiratory: Negative for cough and  hemoptysis.   Cardiovascular: Negative for chest pain and palpitations.  Gastrointestinal: Positive for abdominal pain. Negative for nausea and vomiting.  Genitourinary: Negative for dysuria and urgency.  Musculoskeletal: Negative for myalgias and neck pain.  Skin: Negative for itching and rash.  Neurological: Negative for dizziness, tingling and headaches.  Endo/Heme/Allergies: Does not bruise/bleed easily.  Psychiatric/Behavioral: Negative for depression and suicidal ideas.    Blood pressure (!) 154/70, pulse 90, temperature (!) 97.5 F (36.4 C), temperature source Oral, resp. rate 16, height 5' 2.5" (1.588 m), weight 41.7 kg, SpO2 100 %. Physical Exam  Vitals reviewed. Constitutional: She is oriented to person, place, and time. She appears well-developed and well-nourished.  HENT:  Head: Normocephalic and atraumatic.  Eyes: Pupils are equal, round, and reactive to light. Conjunctivae and EOM are normal.  Neck: Normal range of motion. Neck supple.  Cardiovascular: Normal rate and regular rhythm.  Respiratory: Effort normal and breath sounds normal.  GI: Soft. Bowel sounds are normal. She exhibits no distension. There is no abdominal tenderness.  Musculoskeletal: Normal range of motion.  Neurological: She is alert and oriented to person, place, and time.  Skin: Skin is warm and dry.  Psychiatric: She has a normal mood and affect. Her behavior is normal.     Assessment/Plan 77 yo female with weight loss and concern for scar tissue -diagnostic laparoscopy, adhesiolysis, possible bowel resection -enhanced recovery  Mickeal Skinner, MD 11/25/2018, 7:10 AM

## 2018-11-25 NOTE — Op Note (Signed)
Preoperative diagnosis: abdominal pain weight loss  Postoperative diagnosis: adhesive disease, multiple strictures of distal small bowel, concern for Crohn's disease  Procedure: diagnostic laparoscopy, lysis of adhesions  Surgeon: Gurney Maxin, M.D.  Asst: none  Anesthesia: gen  Indications for procedure: [name] is a 77 y.o. year old female with symptoms of abdominal pain, weight loss, and food intolerance. She had a negative colonoscopy and CT scan showing multiple areas of dilated small intestine. I felt this was abnormal and most likely adhesive disease given hysterectomy and thought it warranted a diagnostic laparoscopy with possible therapy.  Description of procedure: The patient was brought into the operative suite. Anesthesia was administered with General endotracheal anesthesia. WHO checklist was applied. The patient was then placed in supine position. The area was prepped and draped in the usual sterile fashion.  Next, a small transverse incision was made in the left subcostal area and a 5 mm trocar was used to gain to the peritoneal cavity with optical entry technique.  Pneumoperitoneum was applied with flow and low pressure.  Was removal of the laparoscope was reinserted to confirm placement.  On initial visualization of the abdomen, there were adhesions of omentum to the left abdominal wall.  Core percent Marcaine was applied with use of the addition of TAP space on the right and left abdomen.  2 additional 5 mm trochars utilizing up in the lateral mid abdomen to lower abdomen.   Scissors were used to free the omentum from the left abdominal wall as well as some scar tissue to the right colon.  Omentum was then flipped cephalad, the terminal ileum was identified and the small intestine was examined in retrograde fashion.  At 1 foot from the ileocecal valve there was a small stricture like finding of small intestine with dilation proximally, moving more retrograde there were 6  additional stricture areas with small areas of proximal dilation of the small intestine.  These are not completely obstructing and the proximal intestine appeared normal.  The sigmoid colon was seen to adhere to the left abdominal wall, the remainder of the colon appeared normal.  Because the strictures spanned a length of 3 to 4 feet the small intestine, was breaking to do to resect all of this intestine without complete signs of obstruction.  Therefore multiple pictures were taken to document the findings.  Next, I did discuss with additional surgeons Dr. Ninfa Linden with Dr. Dema Severin about the findings in the abdomen as well as doing counts.  They agreed that documentation and further work-up for the past planum rather than proceeding with any resection or stricturoplasty at this time. I then released pneumoperitoneum and removed all trochars.  The skin was closed with 4-0 Monocryl in subcuticular interrupted fashion.  Dermabond was put in place for dressing.  All counts were correct.  Patient awoke from anesthesia was brought to PACU in stable condition.  Findings: 7 stricture areas with small intestine dilation proximal to each of these areas, jejunum normal without dilation, adhesions of omentum to right colon  Specimen: none  Implant: none   Blood loss: <50m  Local anesthesia: 30 ml marcaine   Complications: none  Images:     LGurney Maxin M.D. General, Bariatric, & Minimally Invasive Surgery [facility]

## 2018-11-26 NOTE — Discharge Summary (Signed)
Physician Discharge Summary  Patient ID: [name] MRN: [id number] DOB/AGE: 76-09-1941 76 y.o.  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 She presented to preop and underwent diagnostic laparoscopy with finding of multiple strictured areas of the ileum with some concern for IBD type picture. Decision was made not to proceed with resection at this time. I discussed with her GI doctor of the findings and she will see him in a few weeks in the office. She was then admitted to the surgical floor doing well with ambulation and diet and discharged home POD 1.  Consults: None  Significant Diagnostic Studies: laparoscopy showing nonobstructing strictures of ileum  Treatments: surgery: diagnostic laparsocopy  Discharge Exam: Blood pressure (!) 158/65, pulse 82, temperature 98 F (36.7 C), temperature source Oral, resp. rate 18, height 5' 2.5" (1.588 m), weight 41.7 kg, SpO2 99 %. General appearance: alert and cooperative Head: Normocephalic, without obvious abnormality, atraumatic Neck: no adenopathy, no carotid bruit, no JVD, supple, symmetrical, trachea midline and thyroid not enlarged, symmetric, no tenderness/mass/nodules Back: symmetric, no curvature. ROM normal. No CVA tenderness. GI: incisions c/d/i, nontender  Disposition: Discharge disposition: 01-Home or Self Care       Discharge Instructions    Call MD for:  persistant nausea and vomiting   Complete by:  As directed    Call MD for:  severe uncontrolled pain   Complete by:  As directed    Call MD for:  temperature >100.4   Complete by:  As directed    Diet - low sodium heart healthy   Complete by:  As directed    Discharge wound care:   Complete by:  As directed    Ok to  shower today   Increase activity slowly   Complete by:  As directed      Allergies as of 11/26/2018      Reactions   Clonazepam Swelling, Other (See Comments)   REACTION: Swollen lips, sore mouth   Penicillins Other (See Comments)   Unrecalled reaction Has patient had a PCN reaction causing immediate rash, facial/tongue/throat swelling, SOB or lightheadedness with hypotension: Unknown Has patient had a PCN reaction causing severe rash involving mucus membranes or skin necrosis: Unknown Has patient had a PCN reaction that required hospitalization: Unknown Has patient had a PCN reaction occurring within the last 10 years: No If all of the above answers are "NO", then may proceed with Cephalosporin use.      Medication List    TAKE these medications   acetaminophen 500 MG tablet Commonly known as:  TYLENOL Take 1,000 mg by mouth every 6 (six) hours as needed for moderate pain or headache.   carbamide peroxide 6.5 % OTIC solution Commonly known as:  DEBROX Place 5 drops into the right ear daily as needed (for wax build up).   clidinium-chlordiazePOXIDE 5-2.5 MG capsule Commonly known as:  LIBRAX Take 1 capsule by mouth 2 (two) times daily as needed. What changed:  reasons to take this   dimenhyDRINATE 50 MG tablet Commonly known as:  DRAMAMINE Take 50 mg by mouth at bedtime as needed (for sleep).   docusate sodium 100 MG capsule Commonly known as:  COLACE Take 100 mg by mouth daily.   Potassium Gluconate 595 MG Caps Take 595 mg by mouth every  other day.   pravastatin 20 MG tablet Commonly known as:  PRAVACHOL Take 1 tablet (20 mg total) by mouth daily. What changed:  when to take this   propranolol 40 MG tablet Commonly known as:  INDERAL Take 1 tablet (40 mg total) by mouth 2 (two) times daily.   vitamin B-12 1000 MCG tablet Commonly known as:  CYANOCOBALAMIN Take 1,000 mcg by mouth daily.            Discharge Care Instructions  (From admission, onward)          Start     Ordered   11/26/18 0000  Discharge wound care:    Comments:  Ok to shower today   11/26/18 0839         Follow-up Information    Kinsinger, Arta Bruce, MD Follow up on 12/10/2018.   Specialty:  General Surgery Contact information: [place] [address] [phone]        Nelida Meuse III, MD Follow up in 3 week(s).   Specialty:  Gastroenterology Contact information: [place] [address] [phone]           Signed: Arta Bruce Kinsinger 11/26/2018, 8:42 AM

## 2018-11-26 NOTE — Progress Notes (Signed)
Pt was discharged home today. Instructions were reviewed with patient, and questions were answered. Pt was taken to main entrance via wheelchair by NT.  

## 2018-11-29 ENCOUNTER — Encounter (HOSPITAL_COMMUNITY): Payer: Self-pay | Admitting: General Surgery

## 2018-11-30 ENCOUNTER — Telehealth: Payer: Self-pay

## 2018-11-30 NOTE — Telephone Encounter (Signed)
Spoke to pt after recent hospital stay. She said she is doing better. Still has some gas but overall much better.

## 2018-12-15 ENCOUNTER — Ambulatory Visit: Payer: Self-pay | Admitting: General Surgery

## 2018-12-22 ENCOUNTER — Encounter: Payer: Self-pay | Admitting: Gastroenterology

## 2018-12-22 ENCOUNTER — Ambulatory Visit (INDEPENDENT_AMBULATORY_CARE_PROVIDER_SITE_OTHER): Payer: Medicare HMO | Admitting: Gastroenterology

## 2018-12-22 VITALS — BP 124/70 | HR 80 | Ht 62.0 in | Wt 92.1 lb

## 2018-12-22 DIAGNOSIS — R1084 Generalized abdominal pain: Secondary | ICD-10-CM

## 2018-12-22 DIAGNOSIS — K56699 Other intestinal obstruction unspecified as to partial versus complete obstruction: Secondary | ICD-10-CM

## 2018-12-22 NOTE — Progress Notes (Signed)
[facility] Progress Note  Chief Complaint: Lower abdominal pain and obstructive symptoms  Subjective  History:  [name] follows up with me after her recent surgery with Dr. Kieth Brightly.  Since her last visit, CT scan showed chronic small bowel obstruction, felt likely to be from adhesions.  Dr. Kieth Brightly did a laparoscopy, and found multifocal stricturing with appearance of probable Crohn's disease in the distal ileum.  It was over a length of at least 2 feet of small bowel, so he did not perform resection out of concern for the length of bowel involved.  He spoke with me afterwards asking whether I thought further testing might define this condition or treatment should be given prior to considerations of surgery.  [name] has recovered from that surgery, and has a scheduled small bowel resection about a month from now.  She still has intermittent lower abdominal pain feels very bloated and gassy, and is unsure what to eat.  Her weight has been stable, but she is not sure how to put some on prior to surgery.  She is accompanied by her husband and daughter today.  ROS: Cardiovascular:  no chest pain Respiratory: no dyspnea  The patient's Past Medical, Family and Social History were reviewed and are on file in the EMR.  Objective:  Med list reviewed  Current Outpatient Medications:  .  acetaminophen (TYLENOL) 500 MG tablet, Take 1,000 mg by mouth every 6 (six) hours as needed for moderate pain or headache., Disp: , Rfl:  .  carbamide peroxide (DEBROX) 6.5 % OTIC solution, Place 5 drops into the right ear daily as needed (for wax build up)., Disp: , Rfl:  .  clidinium-chlordiazePOXIDE (LIBRAX) 5-2.5 MG capsule, Take 1 capsule by mouth 2 (two) times daily as needed. (Patient taking differently: Take 1 capsule by mouth 2 (two) times daily as needed (for cramps). ), Disp: 45 capsule, Rfl: 2 .  dimenhyDRINATE (DRAMAMINE) 50 MG tablet, Take 50 mg by mouth at bedtime as needed (for sleep).,  Disp: , Rfl:  .  docusate sodium (COLACE) 100 MG capsule, Take 100 mg by mouth daily. , Disp: , Rfl:  .  Potassium Gluconate 595 MG CAPS, Take 595 mg by mouth every other day. , Disp: , Rfl:  .  pravastatin (PRAVACHOL) 20 MG tablet, Take 1 tablet (20 mg total) by mouth daily. (Patient taking differently: Take 20 mg by mouth at bedtime. ), Disp: 90 tablet, Rfl: 3 .  vitamin B-12 (CYANOCOBALAMIN) 1000 MCG tablet, Take 1,000 mcg by mouth daily., Disp: , Rfl:    Vital signs in last 24 hrs: Vitals:   12/22/18 1041  BP: 124/70  Pulse: 80    Physical Exam    HEENT: sclera anicteric, oral mucosa moist without lesions  Neck: supple, no thyromegaly, JVD or lymphadenopathy  Cardiac: RRR without murmurs, S1S2 heard, no peripheral edema  Pulm: clear to auscultation bilaterally, normal RR and effort noted  Abdomen: soft, lower tenderness, with active bowel sounds. No guarding or palpable hepatosplenomegaly.  Skin; warm and dry, no jaundice or rash  Recent Labs:  Surgical report was reviewed.  CT abdomen and pelvis from September 2019 was reviewed and report is on file.  @ASSESSMENTPLANBEGIN @ Assessment: Encounter Diagnoses  Name Primary?  . Abdominal pain, generalized Yes  . Small bowel stricture (HCC)     She probably has multifocal Crohn's in the distal ileum.  It is not involving the terminal ileum, since that was not well seen on October 2019 colonoscopy. If  this is Crohn's, it is clearly multifocal and densely fibrotic causing obstructive symptoms, and is therefore asked the point of responding to biologic therapy.  She needs stricturoplasty, resection or some combination of those.  After she recovers from that, I will have her follow-up with me.  The resected small bowel will therefore give [place] a definite diagnosis.  If it is Crohn's, then it is difficult to say at what rate it might recur, especially given her age.  When she follows up with me, we can then determine how best  and when to reimage it and/or perform colonoscopy to see if there is any recurrence requiring treatment.  In the meantime, she will stay on a low residue diet, have a least 2 protein calorie shakes per day and I will wait to receive operative reports from Dr. Kieth Brightly. All their questions were answered today. Total time 25 minutes, over half spent face-to-face with patient in counseling and coordination of care.   Nelida Meuse III

## 2018-12-22 NOTE — Patient Instructions (Signed)
If you are age 77 or older, your body mass index should be between 23-30. Your Body mass index is 16.85 kg/m. If this is out of the aforementioned range listed, please consider follow up with your Primary Care Provider.  If you are age 50 or younger, your body mass index should be between 19-25. Your Body mass index is 16.85 kg/m. If this is out of the aformentioned range listed, please consider follow up with your Primary Care Provider.    Thank you for choosing me and [facility].

## 2019-01-12 NOTE — Patient Instructions (Signed)
[name]  01/12/2019   Your procedure is scheduled on: Thursday 01/20/2019  Report to [facility] Main  Entrance              Report to admitting at  [place]  AM     Call this number if you have problems the morning of surgery [phone]               Remember: DO Not eat solid foods after midnight on 01/18/2019!              Follow Bowel Prep from Dr. Kieth Brightly on Wednesday 01/19/2019 and drink clear liquids all day!             NO SOLID FOOD AFTER MIDNIGHT THE NIGHT PRIOR TO SURGERY. NOTHING BY MOUTH EXCEPT CLEAR LIQUIDS UNTIL 3 HOURS PRIOR TO [place] SURGERY. PLEASE FINISH ENSURE DRINK PER SURGEON ORDER 3 HOURS PRIOR TO SCHEDULED SURGERY TIME WHICH NEEDS TO BE COMPLETED AT   0430 am.     CLEAR LIQUID DIET   Foods Allowed                                                                     Foods Excluded  Coffee and tea, regular and decaf                             liquids that you cannot  Plain Jell-O in any flavor                                             see through such as: Fruit ices (not with fruit pulp)                                     milk, soups, orange juice  Iced Popsicles                                    All solid food Carbonated beverages, regular and diet                                    Cranberry, grape and apple juices Sports drinks like Gatorade Lightly seasoned clear broth or consume(fat free) Sugar, honey syrup  Sample Menu Breakfast                                Lunch                                     Supper Cranberry juice                    Beef  broth                            Chicken broth Jell-O                                     Grape juice                           Apple juice Coffee or tea                        Jell-O                                      Popsicle                                                Coffee or tea                        Coffee or  tea  _____________________________________________________________________                BRUSH YOUR TEETH MORNING OF SURGERY AND RINSE YOUR MOUTH OUT, NO CHEWING GUM CANDY OR MINTS.     Take these medicines the morning of surgery with A SIP OF WATER: none                                You may not have any metal on your body including hair pins and              piercings  Do not wear jewelry, make-up, lotions, powders or perfumes, deodorant             Do not wear nail polish.  Do not shave  48 hours prior to surgery.            Do not bring valuables to the hospital. [place].  Contacts, dentures or bridgework may not be worn into surgery.  Leave suitcase in the car. After surgery it may be brought to your room.                   Please read over the following fact sheets you were given: _____________________________________________________________________             [facility] - Preparing for Surgery Before surgery, you can play an important role.  Because skin is not sterile, your skin needs to be as free of germs as possible.  You can reduce the number of germs on your skin by washing with CHG (chlorahexidine gluconate) soap before surgery.  CHG is an antiseptic cleaner which kills germs and bonds with the skin to continue killing germs even after washing. Please DO NOT use if you have an allergy to CHG or antibacterial soaps.  If your skin becomes reddened/irritated stop using the CHG and inform your nurse when you arrive at Short Stay. Do not  shave (including legs and underarms) for at least 48 hours prior to the first CHG shower.  You may shave your face/neck. Please follow these instructions carefully:  1.  Shower with CHG Soap the night before surgery and the  morning of Surgery.  2.  If you choose to wash your hair, wash your hair first as usual with your  normal  shampoo.  3.  After you shampoo, rinse your hair and  body thoroughly to remove the  shampoo.                           4.  Use CHG as you would any other liquid soap.  You can apply chg directly  to the skin and wash                       Gently with a scrungie or clean washcloth.  5.  Apply the CHG Soap to your body ONLY FROM THE NECK DOWN.   Do not use on face/ open                           Wound or open sores. Avoid contact with eyes, ears mouth and genitals (private parts).                       Wash face,  Genitals (private parts) with your normal soap.             6.  Wash thoroughly, paying special attention to the area where your surgery  will be performed.  7.  Thoroughly rinse your body with warm water from the neck down.  8.  DO NOT shower/wash with your normal soap after using and rinsing off  the CHG Soap.                9.  Pat yourself dry with a clean towel.            10.  Wear clean pajamas.            11.  Place clean sheets on your bed the night of your first shower and do not  sleep with pets. Day of Surgery : Do not apply any lotions/deodorants the morning of surgery.  Please wear clean clothes to the hospital/surgery center.  FAILURE TO FOLLOW THESE INSTRUCTIONS MAY RESULT IN THE CANCELLATION OF YOUR SURGERY PATIENT SIGNATURE_________________________________  NURSE SIGNATURE__________________________________  ________________________________________________________________________

## 2019-01-17 ENCOUNTER — Encounter (HOSPITAL_COMMUNITY): Payer: Self-pay

## 2019-01-17 ENCOUNTER — Encounter (HOSPITAL_COMMUNITY)
Admission: RE | Admit: 2019-01-17 | Discharge: 2019-01-17 | Disposition: A | Discharge status: Home / Self Care | Payer: Medicare HMO | Source: Ambulatory Visit | Attending: General Surgery | Admitting: General Surgery

## 2019-01-17 ENCOUNTER — Other Ambulatory Visit: Payer: Self-pay

## 2019-01-17 DIAGNOSIS — Z01812 Encounter for preprocedural laboratory examination: Secondary | ICD-10-CM

## 2019-01-17 LAB — CBC WITH DIFFERENTIAL/PLATELET
Abs Immature Granulocytes: 0.01 10*3/uL (ref 0.00–0.07)
BASOS PCT: 0 %
Basophils Absolute: 0 10*3/uL (ref 0.0–0.1)
Eosinophils Absolute: 0.2 10*3/uL (ref 0.0–0.5)
Eosinophils Relative: 3 %
HCT: 42.8 % (ref 36.0–46.0)
Hemoglobin: 13.7 g/dL (ref 12.0–15.0)
Immature Granulocytes: 0 %
Lymphocytes Relative: 22 %
Lymphs Abs: 1.3 10*3/uL (ref 0.7–4.0)
MCH: 29.7 pg (ref 26.0–34.0)
MCHC: 32 g/dL (ref 30.0–36.0)
MCV: 92.6 fL (ref 80.0–100.0)
Monocytes Absolute: 0.5 10*3/uL (ref 0.1–1.0)
Monocytes Relative: 8 %
NRBC: 0 % (ref 0.0–0.2)
Neutro Abs: 4 10*3/uL (ref 1.7–7.7)
Neutrophils Relative %: 67 %
Platelets: 268 10*3/uL (ref 150–400)
RBC: 4.62 MIL/uL (ref 3.87–5.11)
RDW: 12.2 % (ref 11.5–15.5)
WBC: 5.9 10*3/uL (ref 4.0–10.5)

## 2019-01-17 LAB — COMPREHENSIVE METABOLIC PANEL
ALT: 13 U/L (ref 0–44)
AST: 18 U/L (ref 15–41)
Albumin: 3.5 g/dL (ref 3.5–5.0)
Alkaline Phosphatase: 53 U/L (ref 38–126)
Anion gap: 6 (ref 5–15)
BUN: 10 mg/dL (ref 8–23)
CO2: 27 mmol/L (ref 22–32)
Calcium: 8.9 mg/dL (ref 8.9–10.3)
Chloride: 101 mmol/L (ref 98–111)
Creatinine, Ser: 0.64 mg/dL (ref 0.44–1.00)
GFR calc Af Amer: >60 mL/min (ref >60)
GFR calc non Af Amer: >60 mL/min (ref >60)
Glucose, Bld: 92 mg/dL (ref 70–99)
POTASSIUM: 5.1 mmol/L (ref 3.5–5.1)
Sodium: 134 mmol/L — ABNORMAL LOW (ref 135–145)
Total Bilirubin: 0.3 mg/dL (ref 0.3–1.2)
Total Protein: 6 g/dL — ABNORMAL LOW (ref 6.5–8.1)

## 2019-01-17 LAB — ABO/RH: ABO/RH(D): A POS

## 2019-01-17 MED ORDER — ENSURE PRE-SURGERY PO LIQD
296.0000 mL | Freq: Once | ORAL | Status: DC
  Filled 2019-01-17: qty 296

## 2019-01-19 MED ORDER — GENTAMICIN SULFATE 40 MG/ML IJ SOLN
210.0000 mg | INTRAVENOUS | Status: AC
  Administered 2019-01-20: 210 mg via INTRAVENOUS
  Filled 2019-01-19: qty 5.25

## 2019-01-19 MED ORDER — CLINDAMYCIN PHOSPHATE 900 MG/50ML IV SOLN: 900.0000 mg | INTRAVENOUS | Status: AC

## 2019-01-20 ENCOUNTER — Encounter (HOSPITAL_COMMUNITY): Payer: Self-pay

## 2019-01-20 ENCOUNTER — Inpatient Hospital Stay (HOSPITAL_COMMUNITY): Payer: Medicare HMO | Admitting: Physician Assistant

## 2019-01-20 ENCOUNTER — Inpatient Hospital Stay (HOSPITAL_COMMUNITY): Payer: Medicare HMO | Admitting: Anesthesiology

## 2019-01-20 ENCOUNTER — Other Ambulatory Visit: Payer: Self-pay

## 2019-01-20 ENCOUNTER — Encounter (HOSPITAL_COMMUNITY)
Admission: RE | Disposition: A | Discharge status: Home / Self Care | Payer: Self-pay | Source: Other Acute Inpatient Hospital | Attending: General Surgery

## 2019-01-20 ENCOUNTER — Inpatient Hospital Stay (HOSPITAL_COMMUNITY)
Admission: RE | Admit: 2019-01-20 | Discharge: 2019-01-24 | DRG: 349 | Disposition: A | Discharge status: Home / Self Care | Payer: Medicare HMO | Source: Other Acute Inpatient Hospital | Attending: General Surgery | Admitting: General Surgery

## 2019-01-20 DIAGNOSIS — R109 Unspecified abdominal pain: Secondary | ICD-10-CM | POA: Diagnosis present

## 2019-01-20 DIAGNOSIS — M1990 Unspecified osteoarthritis, unspecified site: Secondary | ICD-10-CM | POA: Diagnosis present

## 2019-01-20 DIAGNOSIS — I700 Atherosclerosis of aorta: Secondary | ICD-10-CM | POA: Diagnosis present

## 2019-01-20 DIAGNOSIS — K56699 Other intestinal obstruction unspecified as to partial versus complete obstruction: Secondary | ICD-10-CM | POA: Diagnosis present

## 2019-01-20 DIAGNOSIS — Z79899 Other long term (current) drug therapy: Secondary | ICD-10-CM | POA: Diagnosis not present

## 2019-01-20 DIAGNOSIS — I447 Left bundle-branch block, unspecified: Secondary | ICD-10-CM | POA: Diagnosis present

## 2019-01-20 DIAGNOSIS — Z9049 Acquired absence of other specified parts of digestive tract: Secondary | ICD-10-CM

## 2019-01-20 DIAGNOSIS — Z880 Allergy status to penicillin: Secondary | ICD-10-CM

## 2019-01-20 DIAGNOSIS — E785 Hyperlipidemia, unspecified: Secondary | ICD-10-CM | POA: Diagnosis not present

## 2019-01-20 DIAGNOSIS — K5669 Other partial intestinal obstruction: Secondary | ICD-10-CM | POA: Diagnosis not present

## 2019-01-20 DIAGNOSIS — Z888 Allergy status to other drugs, medicaments and biological substances status: Secondary | ICD-10-CM

## 2019-01-20 DIAGNOSIS — Z90710 Acquired absence of both cervix and uterus: Secondary | ICD-10-CM | POA: Diagnosis not present

## 2019-01-20 DIAGNOSIS — K219 Gastro-esophageal reflux disease without esophagitis: Secondary | ICD-10-CM | POA: Diagnosis present

## 2019-01-20 DIAGNOSIS — H9191 Unspecified hearing loss, right ear: Secondary | ICD-10-CM | POA: Diagnosis not present

## 2019-01-20 HISTORY — PX: BOWEL RESECTION: SHX1257

## 2019-01-20 LAB — CBC
HCT: 38.2 % (ref 36.0–46.0)
Hemoglobin: 13 g/dL (ref 12.0–15.0)
MCH: 30.1 pg (ref 26.0–34.0)
MCHC: 34 g/dL (ref 30.0–36.0)
MCV: 88.4 fL (ref 80.0–100.0)
Platelets: 240 10*3/uL (ref 150–400)
RBC: 4.32 MIL/uL (ref 3.87–5.11)
RDW: 11.7 % (ref 11.5–15.5)
WBC: 10.2 10*3/uL (ref 4.0–10.5)
nRBC: 0 % (ref 0.0–0.2)

## 2019-01-20 LAB — TYPE AND SCREEN
ABO/RH(D): A POS
ANTIBODY SCREEN: NEGATIVE

## 2019-01-20 LAB — CREATININE, SERUM
Creatinine, Ser: 0.61 mg/dL (ref 0.44–1.00)
GFR calc non Af Amer: >60 mL/min (ref >60)

## 2019-01-20 SURGERY — EXCISION, SMALL INTESTINE
Anesthesia: General | Site: Abdomen

## 2019-01-20 MED ORDER — ROCURONIUM BROMIDE 10 MG/ML (PF) SYRINGE
PREFILLED_SYRINGE | INTRAVENOUS | Status: DC | PRN
  Administered 2019-01-20: 50 mg via INTRAVENOUS

## 2019-01-20 MED ORDER — DIPHENHYDRAMINE HCL 12.5 MG/5ML PO ELIX: 12.5000 mg | ORAL_SOLUTION | Freq: Four times a day (QID) | ORAL | Status: DC | PRN

## 2019-01-20 MED ORDER — LIDOCAINE 2% (20 MG/ML) 5 ML SYRINGE
INTRAMUSCULAR | Status: DC | PRN
  Administered 2019-01-20: 60 mg via INTRAVENOUS

## 2019-01-20 MED ORDER — SUGAMMADEX SODIUM 200 MG/2ML IV SOLN
INTRAVENOUS | Status: AC
  Filled 2019-01-20: qty 2

## 2019-01-20 MED ORDER — PRAVASTATIN SODIUM 20 MG PO TABS
20.0000 mg | ORAL_TABLET | Freq: Every day | ORAL | Status: DC
  Administered 2019-01-20 – 2019-01-23 (×4): 20 mg via ORAL
  Filled 2019-01-20 (×4): qty 1

## 2019-01-20 MED ORDER — CELECOXIB 200 MG PO CAPS
200.0000 mg | ORAL_CAPSULE | Freq: Two times a day (BID) | ORAL | Status: DC
  Administered 2019-01-20 – 2019-01-22 (×5): 200 mg via ORAL
  Filled 2019-01-20 (×5): qty 1

## 2019-01-20 MED ORDER — HEPARIN SODIUM (PORCINE) 5000 UNIT/ML IJ SOLN
5000.0000 [IU] | Freq: Once | INTRAMUSCULAR | Status: AC
  Administered 2019-01-20: 5000 [IU] via SUBCUTANEOUS
  Filled 2019-01-20: qty 1

## 2019-01-20 MED ORDER — CILIDINIUM-CHLORDIAZEPOXIDE 2.5-5 MG PO CAPS
1.0000 | ORAL_CAPSULE | Freq: Two times a day (BID) | ORAL | Status: DC | PRN
  Filled 2019-01-20: qty 1

## 2019-01-20 MED ORDER — BUPIVACAINE LIPOSOME 1.3 % IJ SUSP
20.0000 mL | Freq: Once | INTRAMUSCULAR | Status: AC
  Administered 2019-01-20: 20 mL
  Filled 2019-01-20: qty 20

## 2019-01-20 MED ORDER — SCOPOLAMINE 1 MG/3DAYS TD PT72
1.0000 | MEDICATED_PATCH | TRANSDERMAL | Status: DC
  Administered 2019-01-20: 1.5 mg via TRANSDERMAL
  Filled 2019-01-20: qty 1

## 2019-01-20 MED ORDER — LIDOCAINE 2% (20 MG/ML) 5 ML SYRINGE
INTRAMUSCULAR | Status: AC
  Filled 2019-01-20: qty 5

## 2019-01-20 MED ORDER — DIMENHYDRINATE 50 MG PO TABS
50.0000 mg | ORAL_TABLET | Freq: Every evening | ORAL | Status: DC | PRN
  Filled 2019-01-20: qty 1

## 2019-01-20 MED ORDER — HYDROCODONE-ACETAMINOPHEN 7.5-325 MG PO TABS: 1.0000 | ORAL_TABLET | Freq: Once | ORAL | Status: DC | PRN

## 2019-01-20 MED ORDER — ONDANSETRON HCL 4 MG/2ML IJ SOLN: 4.0000 mg | Freq: Four times a day (QID) | INTRAMUSCULAR | Status: DC | PRN

## 2019-01-20 MED ORDER — ONDANSETRON HCL 4 MG/2ML IJ SOLN
INTRAMUSCULAR | Status: AC
  Filled 2019-01-20: qty 2

## 2019-01-20 MED ORDER — SUGAMMADEX SODIUM 200 MG/2ML IV SOLN
INTRAVENOUS | Status: DC | PRN
  Administered 2019-01-20: 150 mg via INTRAVENOUS

## 2019-01-20 MED ORDER — PHENYLEPHRINE 40 MCG/ML (10ML) SYRINGE FOR IV PUSH (FOR BLOOD PRESSURE SUPPORT)
PREFILLED_SYRINGE | INTRAVENOUS | Status: DC | PRN
  Administered 2019-01-20: 80 ug via INTRAVENOUS
  Administered 2019-01-20 (×2): 120 ug via INTRAVENOUS

## 2019-01-20 MED ORDER — TRAMADOL HCL 50 MG PO TABS
50.0000 mg | ORAL_TABLET | Freq: Four times a day (QID) | ORAL | Status: DC | PRN
  Administered 2019-01-22: 50 mg via ORAL
  Filled 2019-01-20: qty 1

## 2019-01-20 MED ORDER — ROCURONIUM BROMIDE 100 MG/10ML IV SOLN
INTRAVENOUS | Status: AC
  Filled 2019-01-20: qty 1

## 2019-01-20 MED ORDER — BUPIVACAINE HCL (PF) 0.25 % IJ SOLN
INTRAMUSCULAR | Status: DC | PRN
  Administered 2019-01-20: 30 mL

## 2019-01-20 MED ORDER — ONDANSETRON 4 MG PO TBDP: 4.0000 mg | ORAL_TABLET | Freq: Four times a day (QID) | ORAL | Status: DC | PRN

## 2019-01-20 MED ORDER — LACTATED RINGERS IR SOLN
Status: DC | PRN
  Administered 2019-01-20: 1000 mL

## 2019-01-20 MED ORDER — DIPHENHYDRAMINE HCL 50 MG/ML IJ SOLN: 12.5000 mg | Freq: Four times a day (QID) | INTRAMUSCULAR | Status: DC | PRN

## 2019-01-20 MED ORDER — PROPOFOL 10 MG/ML IV BOLUS
INTRAVENOUS | Status: AC
  Filled 2019-01-20: qty 20

## 2019-01-20 MED ORDER — HYDRALAZINE HCL 20 MG/ML IJ SOLN: 10.0000 mg | INTRAMUSCULAR | Status: DC | PRN

## 2019-01-20 MED ORDER — DEXAMETHASONE SODIUM PHOSPHATE 10 MG/ML IJ SOLN
INTRAMUSCULAR | Status: DC | PRN
  Administered 2019-01-20: 10 mg via INTRAVENOUS

## 2019-01-20 MED ORDER — FENTANYL CITRATE (PF) 250 MCG/5ML IJ SOLN
INTRAMUSCULAR | Status: AC
  Filled 2019-01-20: qty 5

## 2019-01-20 MED ORDER — DEXAMETHASONE SODIUM PHOSPHATE 10 MG/ML IJ SOLN
INTRAMUSCULAR | Status: AC
  Filled 2019-01-20: qty 1

## 2019-01-20 MED ORDER — ONDANSETRON HCL 4 MG/2ML IJ SOLN
INTRAMUSCULAR | Status: DC | PRN
  Administered 2019-01-20: 4 mg via INTRAVENOUS

## 2019-01-20 MED ORDER — FENTANYL CITRATE (PF) 100 MCG/2ML IJ SOLN: 25.0000 ug | INTRAMUSCULAR | Status: DC | PRN

## 2019-01-20 MED ORDER — PROPOFOL 10 MG/ML IV BOLUS
INTRAVENOUS | Status: DC | PRN
  Administered 2019-01-20: 100 mg via INTRAVENOUS

## 2019-01-20 MED ORDER — BUPIVACAINE HCL (PF) 0.25 % IJ SOLN
INTRAMUSCULAR | Status: AC
  Filled 2019-01-20: qty 30

## 2019-01-20 MED ORDER — DEXTROSE-NACL 5-0.45 % IV SOLN
INTRAVENOUS | Status: DC
  Administered 2019-01-20: 14:00:00 via INTRAVENOUS

## 2019-01-20 MED ORDER — SIMETHICONE 80 MG PO CHEW: 40.0000 mg | CHEWABLE_TABLET | Freq: Four times a day (QID) | ORAL | Status: DC | PRN

## 2019-01-20 MED ORDER — CHLORHEXIDINE GLUCONATE CLOTH 2 % EX PADS: 6.0000 | MEDICATED_PAD | Freq: Once | CUTANEOUS | Status: DC

## 2019-01-20 MED ORDER — MORPHINE SULFATE (PF) 4 MG/ML IV SOLN: 2.0000 mg | INTRAVENOUS | Status: DC | PRN

## 2019-01-20 MED ORDER — ACETAMINOPHEN 500 MG PO TABS
1000.0000 mg | ORAL_TABLET | ORAL | Status: AC
  Administered 2019-01-20: 1000 mg via ORAL
  Filled 2019-01-20: qty 2

## 2019-01-20 MED ORDER — LIDOCAINE 2% (20 MG/ML) 5 ML SYRINGE
INTRAMUSCULAR | Status: DC | PRN
  Administered 2019-01-20: 1.5 mg/kg/h via INTRAVENOUS

## 2019-01-20 MED ORDER — PHENYLEPHRINE HCL 10 MG/ML IJ SOLN
INTRAMUSCULAR | Status: AC
  Filled 2019-01-20: qty 1

## 2019-01-20 MED ORDER — HYDROMORPHONE HCL 1 MG/ML IJ SOLN
INTRAMUSCULAR | Status: AC
  Filled 2019-01-20: qty 1

## 2019-01-20 MED ORDER — FENTANYL CITRATE (PF) 250 MCG/5ML IJ SOLN
INTRAMUSCULAR | Status: DC | PRN
  Administered 2019-01-20: 100 ug via INTRAVENOUS
  Administered 2019-01-20 (×3): 50 ug via INTRAVENOUS

## 2019-01-20 MED ORDER — ONDANSETRON HCL 4 MG/2ML IJ SOLN: 4.0000 mg | Freq: Once | INTRAMUSCULAR | Status: DC | PRN

## 2019-01-20 MED ORDER — HYDROCODONE-ACETAMINOPHEN 5-325 MG PO TABS
1.0000 | ORAL_TABLET | ORAL | Status: DC | PRN
  Administered 2019-01-23: 1 via ORAL
  Filled 2019-01-20: qty 1
  Filled 2019-01-20: qty 2

## 2019-01-20 MED ORDER — HYDROMORPHONE HCL 1 MG/ML IJ SOLN
0.2500 mg | INTRAMUSCULAR | Status: DC | PRN
  Administered 2019-01-20 (×4): 0.25 mg via INTRAVENOUS

## 2019-01-20 MED ORDER — 0.9 % SODIUM CHLORIDE (POUR BTL) OPTIME
TOPICAL | Status: DC | PRN
  Administered 2019-01-20: 2000 mL

## 2019-01-20 MED ORDER — ENOXAPARIN SODIUM 40 MG/0.4ML ~~LOC~~ SOLN
40.0000 mg | SUBCUTANEOUS | Status: DC
  Administered 2019-01-21 – 2019-01-24 (×4): 40 mg via SUBCUTANEOUS
  Filled 2019-01-20 (×4): qty 0.4

## 2019-01-20 MED ORDER — VITAMIN B-12 1000 MCG PO TABS
1000.0000 ug | ORAL_TABLET | Freq: Every day | ORAL | Status: DC
  Administered 2019-01-21 – 2019-01-24 (×4): 1000 ug via ORAL
  Filled 2019-01-20 (×4): qty 1

## 2019-01-20 MED ORDER — LACTATED RINGERS IV SOLN
INTRAVENOUS | Status: DC
  Administered 2019-01-20 (×2): via INTRAVENOUS

## 2019-01-20 SURGICAL SUPPLY — 40 items
ADH SKN CLS APL DERMABOND .7 (GAUZE/BANDAGES/DRESSINGS) ×1
CELLS DAT CNTRL 66122 CELL SVR (MISCELLANEOUS) ×1 IMPLANT
COVER SURGICAL LIGHT HANDLE (MISCELLANEOUS) ×2 IMPLANT
COVER WAND RF STERILE (DRAPES) IMPLANT
DECANTER SPIKE VIAL GLASS SM (MISCELLANEOUS) IMPLANT
DERMABOND ADVANCED (GAUZE/BANDAGES/DRESSINGS) ×1
DERMABOND ADVANCED .7 DNX12 (GAUZE/BANDAGES/DRESSINGS) IMPLANT
DRAPE WARM FLUID 44X44 (DRAPE) ×2 IMPLANT
DRSG OPSITE POSTOP 4X6 (GAUZE/BANDAGES/DRESSINGS) ×1 IMPLANT
ELECT REM PT RETURN 15FT ADLT (MISCELLANEOUS) ×2 IMPLANT
GLOVE BIOGEL PI IND STRL 7.0 (GLOVE) ×1 IMPLANT
GLOVE BIOGEL PI INDICATOR 7.0 (GLOVE) ×1
GLOVE SURG SS PI 7.0 STRL IVOR (GLOVE) ×4 IMPLANT
GOWN STRL REUS W/TWL LRG LVL3 (GOWN DISPOSABLE) ×2 IMPLANT
GOWN STRL REUS W/TWL XL LVL3 (GOWN DISPOSABLE) ×4 IMPLANT
IRRIG SUCT STRYKERFLOW 2 WTIP (MISCELLANEOUS)
IRRIGATION SUCT STRKRFLW 2 WTP (MISCELLANEOUS) IMPLANT
KIT BASIN OR (CUSTOM PROCEDURE TRAY) ×2 IMPLANT
LIGASURE IMPACT 36 18CM CVD LR (INSTRUMENTS) ×1 IMPLANT
RELOAD PROXIMATE 75MM BLUE (ENDOMECHANICALS) ×6 IMPLANT
RELOAD STAPLE 75 3.8 BLU REG (ENDOMECHANICALS) IMPLANT
RETRACTOR WND ALEXIS 18 MED (MISCELLANEOUS) IMPLANT
RTRCTR WOUND ALEXIS 18CM MED (MISCELLANEOUS) ×2
SHEARS HARMONIC ACE PLUS 36CM (ENDOMECHANICALS) IMPLANT
SLEEVE XCEL OPT CAN 5 100 (ENDOMECHANICALS) ×2 IMPLANT
STAPLER GUN LINEAR PROX 60 (STAPLE) ×1 IMPLANT
STAPLER PROXIMATE 75MM BLUE (STAPLE) ×2 IMPLANT
SUT MNCRL AB 4-0 PS2 18 (SUTURE) ×2 IMPLANT
SUT SILK 2 0 (SUTURE)
SUT SILK 2 0 SH CR/8 (SUTURE) IMPLANT
SUT SILK 2-0 18XBRD TIE 12 (SUTURE) IMPLANT
SUT SILK 3 0 (SUTURE)
SUT SILK 3 0 SH CR/8 (SUTURE) IMPLANT
SUT SILK 3-0 18XBRD TIE 12 (SUTURE) IMPLANT
TOWEL OR 17X26 10 PK STRL BLUE (TOWEL DISPOSABLE) ×2 IMPLANT
TOWEL OR NON WOVEN STRL DISP B (DISPOSABLE) ×2 IMPLANT
TRAY FOLEY MTR SLVR 16FR STAT (SET/KITS/TRAYS/PACK) IMPLANT
TRAY LAPAROSCOPIC (CUSTOM PROCEDURE TRAY) ×2 IMPLANT
TROCAR BLADELESS OPT 5 100 (ENDOMECHANICALS) ×2 IMPLANT
TROCAR XCEL 12X100 BLDLESS (ENDOMECHANICALS) IMPLANT

## 2019-01-20 NOTE — Anesthesia Preprocedure Evaluation (Signed)
Anesthesia Evaluation  Patient identified by MRN, date of birth, ID band Patient awake    Reviewed: Allergy & Precautions, NPO status , Patient's Chart, lab work & pertinent test results  Airway Mallampati: II  TM Distance: >3 FB Neck ROM: Full    Dental  (+) Teeth Intact, Dental Advisory Given   Pulmonary    breath sounds clear to auscultation       Cardiovascular  Rhythm:Regular Rate:Normal     Neuro/Psych    GI/Hepatic   Endo/Other    Renal/GU      Musculoskeletal   Abdominal   Peds  Hematology   Anesthesia Other Findings   Reproductive/Obstetrics                             Anesthesia Physical Anesthesia Plan  ASA: III  Anesthesia Plan: General   Post-op Pain Management:    Induction: Intravenous  PONV Risk Score and Plan: Ondansetron and Dexamethasone  Airway Management Planned: Oral ETT  Additional Equipment:   Intra-op Plan:   Post-operative Plan: Extubation in OR  Informed Consent: I have reviewed the patients History and Physical, chart, labs and discussed the procedure including the risks, benefits and alternatives for the proposed anesthesia with the patient or authorized representative who has indicated his/her understanding and acceptance.     Dental advisory given  Plan Discussed with: CRNA and Anesthesiologist  Anesthesia Plan Comments: (Chronic LBBB  Plan GA with oral ETT  Roberts Gaudy)        Anesthesia Quick Evaluation

## 2019-01-20 NOTE — H&P (Signed)
[name] is an 78 y.o. female.   Chief Complaint: abdominal pain HPI: 78 yo female with multiple months of abdominal pain. We did a diagnostic laparoscopy with findings of stricture concerning for IBD. She now presents for resection.  Past Medical History:  Diagnosis Date  . Anxiety   . Aortic atherosclerosis ([place])   . GERD (gastroesophageal reflux disease)    Dr. [name]  . H/O abnormal weight loss    lost over 20 lbs in the last year  . 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 History of Bell's palsy 02/2011  . HLD (hyperlipidemia)    Dr. Lia Foyer  . HOH (hard of hearing)    uses hearing aid on left side/complete loss of hearing right side  . IBS (irritable bowel syndrome)   . LBBB (left bundle branch block)   . Migraine   . OA (osteoarthritis)   . Osteopenia 2005  . Ulcer 2003   bleeding stomach ulcer    Past Surgical History:  Procedure Laterality Date  . ABDOMINAL HYSTERECTOMY  1983  . APPENDECTOMY  1983  . CARDIAC CATHETERIZATION  1997   okay, mild changes  . CHOLECYSTECTOMY  1992  . COLONOSCOPY  2003  . DEXA N/A 2005   borderline osteopenia  . DIAGNOSTIC LAPAROSCOPY  11/25/2018  . LAPAROSCOPY N/A 11/25/2018   Procedure: LAPAROSCOPY DIAGNOSTIC;  Surgeon: Kieth Brightly Arta Bruce, MD;  Location: [facility] ORS;  Service: General;  Laterality: N/A;  . TONSILLECTOMY AND ADENOIDECTOMY  1953  . UPPER GASTROINTESTINAL ENDOSCOPY  2003    Family History  Problem Relation Age of Onset  . Heart attack Father 74  . Diabetes Father   . Coronary artery disease Father   . COPD Mother   . Hypertension Mother   . Hypertension Sister   . Pulmonary fibrosis Sister   . Cancer Daughter        breast  . Rheum arthritis Daughter   . Colon cancer Neg Hx   . Esophageal cancer Neg Hx   . Rectal cancer Neg Hx    Social History:  reports that she has never smoked. She has never used smokeless tobacco. She reports that she does not drink alcohol or use drugs.  Allergies:  Allergies   Allergen Reactions  . Clonazepam Swelling and Other (See Comments)    REACTION: Swollen lips, sore mouth  . Penicillins Other (See Comments)    Unrecalled reaction Has patient had a PCN reaction causing immediate rash, facial/tongue/throat swelling, SOB or lightheadedness with hypotension: Unknown Has patient had a PCN reaction causing severe rash involving mucus membranes or skin necrosis: Unknown Has patient had a PCN reaction that required hospitalization: Unknown Has patient had a PCN reaction occurring within the last 10 years: No If all of the above answers are "NO", then may proceed with Cephalosporin use.     Medications Prior to Admission  Medication Sig Dispense Refill  . acetaminophen (TYLENOL) 500 MG tablet Take 1,000 mg by mouth every 6 (six) hours as needed for moderate pain or headache.    . carbamide peroxide (DEBROX) 6.5 % OTIC solution Place 5 drops into the right ear daily as needed (for wax build up).    . dimenhyDRINATE (DRAMAMINE) 50 MG tablet Take 50 mg by mouth at bedtime as needed (for sleep).    . docusate sodium (COLACE) 100 MG capsule Take 100 mg by mouth every evening.     . Potassium Gluconate 550 MG TABS Take 550  mg by mouth 3 (three) times a week.    . pravastatin (PRAVACHOL) 20 MG tablet Take 1 tablet (20 mg total) by mouth daily. (Patient taking differently: Take 20 mg by mouth at bedtime. ) 90 tablet 3  . Probiotic Product (PROBIOTIC PO) Take 1 capsule by mouth daily.    . vitamin B-12 (CYANOCOBALAMIN) 1000 MCG tablet Take 1,000 mcg by mouth daily.    . clidinium-chlordiazePOXIDE (LIBRAX) 5-2.5 MG capsule Take 1 capsule by mouth 2 (two) times daily as needed. (Patient taking differently: Take 1 capsule by mouth 2 (two) times daily as needed (for cramps). ) 45 capsule 2    No results found for this or any previous visit (from the past 48 hour(s)). No results found.  Review of Systems  Constitutional: Negative for chills and fever.  HENT: Negative for  hearing loss.   Eyes: Negative for blurred vision and double vision.  Respiratory: Negative for cough and hemoptysis.   Cardiovascular: Negative for chest pain and palpitations.  Gastrointestinal: Positive for abdominal pain. Negative for nausea and vomiting.  Genitourinary: Negative for dysuria and urgency.  Musculoskeletal: Negative for myalgias and neck pain.  Skin: Negative for itching and rash.  Neurological: Negative for dizziness, tingling and headaches.  Endo/Heme/Allergies: Does not bruise/bleed easily.  Psychiatric/Behavioral: Negative for depression and suicidal ideas.    Blood pressure (!) 145/70, pulse (!) 104, temperature (!) 97.5 F (36.4 C), temperature source Oral, resp. rate 16, height 5' 2"  (1.575 m), weight 41.7 kg, SpO2 100 %. Physical Exam  Vitals reviewed. Constitutional: She is oriented to person, place, and time. She appears well-developed and well-nourished.  HENT:  Head: Normocephalic and atraumatic.  Eyes: Pupils are equal, round, and reactive to light. Conjunctivae and EOM are normal.  Neck: Normal range of motion. Neck supple.  Cardiovascular: Normal rate and regular rhythm.  Respiratory: Effort normal and breath sounds normal.  GI: Soft. Bowel sounds are normal. She exhibits no distension. There is no abdominal tenderness.  Musculoskeletal: Normal range of motion.  Neurological: She is alert and oriented to person, place, and time.  Skin: Skin is warm and dry.  Psychiatric: She has a normal mood and affect. Her behavior is normal.     Assessment/Plan 78 yo female with strictures of distal ileum -lap assisted small bowel resection -enhanced recovery -inpatient admission  Mickeal Skinner, MD 01/20/2019, 7:26 AM

## 2019-01-20 NOTE — Anesthesia Postprocedure Evaluation (Signed)
Anesthesia Post Note  Patient: [name]  Procedure(s) Performed: DIAGNOSTIC LAP WITH SMALL BOWEL RESECTION (N/A Abdomen)     Patient location during evaluation: PACU Anesthesia Type: General Level of consciousness: awake and alert Pain management: pain level controlled Vital Signs Assessment: post-procedure vital signs reviewed and stable Respiratory status: spontaneous breathing, nonlabored ventilation, respiratory function stable and patient connected to nasal cannula oxygen Cardiovascular status: blood pressure returned to baseline and stable Postop Assessment: no apparent nausea or vomiting Anesthetic complications: no    Last Vitals:  Vitals:   01/20/19 1015 01/20/19 1030  BP: (!) 152/77 (!) 156/69  Pulse: 83 71  Resp: 20 12  Temp:    SpO2: 100% 100%    Last Pain:  Vitals:   01/20/19 1015  TempSrc:   PainSc: 3                  Antwonette Feliz COKER

## 2019-01-20 NOTE — Transfer of Care (Signed)
Immediate Anesthesia Transfer of Care Note  Patient: [name]  Procedure(s) Performed: DIAGNOSTIC LAP WITH SMALL BOWEL RESECTION (N/A Abdomen)  Patient Location: PACU  Anesthesia Type:General  Level of Consciousness: awake, alert , oriented and patient cooperative  Airway & Oxygen Therapy: Patient Spontanous Breathing and Patient connected to face mask oxygen  Post-op Assessment: Report given to RN, Post -op Vital signs reviewed and stable and Patient moving all extremities  Post vital signs: Reviewed and stable  Last Vitals:  Vitals Value Taken Time  BP 177/81 01/20/2019  8:57 AM  Temp    Pulse 108 01/20/2019  8:59 AM  Resp 18 01/20/2019  8:59 AM  SpO2 100 % 01/20/2019  8:59 AM  Vitals shown include unvalidated device data.  Last Pain:  Vitals:   01/20/19 0604  TempSrc:   PainSc: 0-No pain         Complications: No apparent anesthesia complications

## 2019-01-20 NOTE — Op Note (Signed)
Preoperative diagnosis: small bowel stricture  Postoperative diagnosis: same   Procedure: diagnostic laparoscopy, small bowel resection with anastomosis  Surgeon: Gurney Maxin, M.D.  Asst: P.J. Toth  Anesthesia: general  Indications for procedure: [name] is a 77 y.o. year old female with symptoms of abdominal pain and difficulty with eating leading to weight loss.  Description of procedure: The patient was brought into the operative suite. Anesthesia was administered with General endotracheal anesthesia. WHO checklist was applied. The patient was then placed in supine position. The area was prepped and draped in the usual sterile fashion.  Next, a transverse incision was made and a 5 mm trocar was used to gain entry into the peritoneal cavity with optical entry technique.  Pneumoperitoneum was applied with a high flow and low pressure.  Laparoscope was reinserted to confirm placement.  On initial evaluation there appeared to be a small serosal tear from entry stenotic appeared full-thickness and did not require repair.  There were no adhesions in the middle portion of the abdomen.  One additional trocar was placed in the left lower abdomen.  Next Exparel Marcaine mix was injected in a T AP space on the left and right.  The omentum was flipped cephalad and distal ileum was examined there were multiple areas of creeping fat and stricturing of the distal ileum was removed retrograde and found an area that appeared normal clamping it with the grasper for marking.  Next, we made a small lower midline incision and placed a wound protector in the area and then extracorporealized the distal ileum.  At this time pneumoperitoneum was removed and all trochars were removed.  There to be a 30 cm spared area of the distal ileum and then there was approximately 70 cm of creeping fat and stricturing of the small intestine with intermittent dilation.  A point of normal ileum was chosen distally a  mesenteric window was made and a 70-minute 5 mm GIA blue load stapler was used to divide the intestine.  Point proximally was then chosen mesenteric window was created in a 75 mm GIA blue load stapler was used to divide the jejunum.  LigaSure was then taken for the mesentery.  Mesentery was thickened omental and so additional 3 oh silks were used for hemostasis in these areas.  Upon examining the proximal end of the jejunum there are additional strictures proximal so an additional 2 cm of jejunum was taken by creating an additional mesenteric window and dividing with 75 mm GIA blue load stapler.  Mesentery was then taken with the LigaSure device.  Next, a side-to-side functional end-to-end anastomosis was created with 75 mm blue load stapler and the mesenteric defect was closed with a 60 mm TA stapler.  The mesenteric window was closed with interrupted 3-0 silks.  Additional 3-0 silks were used as a crotch stitch and to imbricate the portion of the staple line.    The jejunum was then placed back into the abdomen the omentum was put back in place.  The fascia was then closed with a 0 PDS in running fashion.  Incision was closed with a 4-0 Monocryl in subcuticular running fashion.  The trocar sites were closed with 4-0 Monocryl in subcuticular interrupted fashion.  Patient did well from anesthesia was brought to PACU in stable condition.  All counts were correct.  Findings: 3 feet of distal ileum with creeping fat and stricturing with proximal dilation. 1 foot distal ileum spared.  Specimen: ileum stitch marks distal, more proximal ileum  stitch marks distal  Implant: none   Blood loss: 25m  Local anesthesia: 50 ml Exparel:Marcaine Mix  Complications: none  LGurney Maxin M.D. General, Bariatric, & Minimally Invasive Surgery [facility], [facility]

## 2019-01-20 NOTE — Anesthesia Procedure Notes (Signed)
Procedure Name: Intubation Date/Time: 01/20/2019 7:42 AM Performed by: Mitzie Na, CRNA Pre-anesthesia Checklist: Patient identified, Emergency Drugs available, Suction available, Patient being monitored and Timeout performed Patient Re-evaluated:Patient Re-evaluated prior to induction Oxygen Delivery Method: Circle system utilized Preoxygenation: Pre-oxygenation with 100% oxygen Induction Type: IV induction Ventilation: Mask ventilation without difficulty Laryngoscope Size: Mac and 3 Grade View: Grade I Tube type: Oral Tube size: 6.5 mm Number of attempts: 1 Airway Equipment and Method: Stylet Placement Confirmation: ETT inserted through vocal cords under direct vision,  positive ETCO2 and breath sounds checked- equal and bilateral Secured at: 22 cm Tube secured with: Tape Dental Injury: Teeth and Oropharynx as per pre-operative assessment

## 2019-01-21 ENCOUNTER — Encounter (HOSPITAL_COMMUNITY): Payer: Self-pay | Admitting: General Surgery

## 2019-01-21 LAB — COMPREHENSIVE METABOLIC PANEL
ALBUMIN: 3.1 g/dL — AB (ref 3.5–5.0)
ALT: 16 U/L (ref 0–44)
AST: 26 U/L (ref 15–41)
Alkaline Phosphatase: 44 U/L (ref 38–126)
Anion gap: 8 (ref 5–15)
BUN: 5 mg/dL — ABNORMAL LOW (ref 8–23)
CALCIUM: 8.6 mg/dL — AB (ref 8.9–10.3)
CO2: 24 mmol/L (ref 22–32)
Chloride: 95 mmol/L — ABNORMAL LOW (ref 98–111)
Creatinine, Ser: 0.68 mg/dL (ref 0.44–1.00)
GFR calc Af Amer: >60 mL/min (ref >60)
GFR calc non Af Amer: >60 mL/min (ref >60)
Glucose, Bld: 107 mg/dL — ABNORMAL HIGH (ref 70–99)
Potassium: 3.6 mmol/L (ref 3.5–5.1)
SODIUM: 127 mmol/L — AB (ref 135–145)
TOTAL PROTEIN: 5.2 g/dL — AB (ref 6.5–8.1)
Total Bilirubin: 0.7 mg/dL (ref 0.3–1.2)

## 2019-01-21 LAB — CBC
HCT: 35.1 % — ABNORMAL LOW (ref 36.0–46.0)
HEMOGLOBIN: 11.7 g/dL — AB (ref 12.0–15.0)
MCH: 30.4 pg (ref 26.0–34.0)
MCHC: 33.3 g/dL (ref 30.0–36.0)
MCV: 91.2 fL (ref 80.0–100.0)
Platelets: 229 10*3/uL (ref 150–400)
RBC: 3.85 MIL/uL — ABNORMAL LOW (ref 3.87–5.11)
RDW: 11.9 % (ref 11.5–15.5)
WBC: 11 10*3/uL — ABNORMAL HIGH (ref 4.0–10.5)
nRBC: 0 % (ref 0.0–0.2)

## 2019-01-21 NOTE — Progress Notes (Signed)
  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[place])  s/p Procedure(s): DIAGNOSTIC LAP WITH SMALL BOWEL RESECTION 01/20/2019 -advance diet -continue ambulation    LOS: 1 day  Chief Complaint/Subjective: Pain moderate with soreness, ambulated yesterday, no nausea, tolerating liquids  Objective: Vital signs in last 24 hours: Temp:  [97.4 F (36.3 C)-98.2 F (36.8 C)] 98.2 F (36.8 C) (02/14 0625) Pulse Rate:  [68-108] 82 (02/14 0625) Resp:  [10-21] 16 (02/14 0625) BP: (131-178)/(54-85) 131/58 (02/14 0625) SpO2:  [100 %] 100 % (02/14 0625) Last BM Date: 01/19/19  Intake/Output from previous day: 02/13 0701 - 02/14 0700 In: 3375.8 [P.O.:780; I.V.:2495.8; IV Piggyback:100] Out: 875 [Urine:850; Blood:25] Intake/Output this shift: No intake/output data recorded.  Lungs: nonlabored breathing  Cardiovascular: RRR  Abd: soft, ATTP, incisions c/d/i  Extremities: no edema  Neuro: AOx4  Lab Results: CBC  Recent Labs    01/20/19 1142 01/21/19 0430  WBC 10.2 11.0*  HGB 13.0 11.7*  HCT 38.2 35.1*  PLT 240 229   BMET Recent Labs    01/20/19 1142 01/21/19 0430  NA  --  127*  K  --  3.6  CL  --  95*  CO2  --  24  GLUCOSE  --  107*  BUN  --  5*  CREATININE 0.61 0.68  CALCIUM  --  8.6*   PT/INR No results for input(s): LABPROT, INR in the last 72 hours. ABG No results for input(s): PHART, HCO3 in the last 72 hours.  Invalid input(s): PCO2, PO2  Studies/Results:  Anti-infectives: Anti-infectives (From admission, onward)   Start     Dose/Rate Route Frequency Ordered Stop   01/20/19 0600  gentamicin (GARAMYCIN) 210 mg in dextrose 5 % 100 mL IVPB     210 mg 105.3 mL/hr over 60 Minutes Intravenous On call to O.R. 01/19/19 8786 01/20/19 0744   01/19/19 0915  clindamycin (CLEOCIN) IVPB 900 mg     900 mg 100 mL/hr over 30 Minutes Intravenous On call to O.R. 01/19/19 0905 01/20/19 0559       Medications: Scheduled Meds: . celecoxib  200 mg Oral BID  . enoxaparin (LOVENOX) injection  40 mg Subcutaneous Q24H  . pravastatin  20 mg Oral QHS  . vitamin B-12  1,000 mcg Oral Daily   Continuous Infusions: . dextrose 5 % and 0.45% NaCl 50 mL/hr at 01/20/19 1400  . lactated ringers 50 mL/hr at 01/20/19 0605   PRN Meds:.clidinium-chlordiazePOXIDE, dimenhyDRINATE, diphenhydrAMINE **OR** diphenhydrAMINE, hydrALAZINE, HYDROcodone-acetaminophen, morphine injection, ondansetron **OR** ondansetron (ZOFRAN) IV, simethicone, traMADol  Mickeal Skinner, MD [facility]

## 2019-01-22 MED ORDER — CALCIUM POLYCARBOPHIL 625 MG PO TABS
625.0000 mg | ORAL_TABLET | Freq: Two times a day (BID) | ORAL | Status: DC
  Administered 2019-01-22 – 2019-01-24 (×5): 625 mg via ORAL
  Filled 2019-01-22 (×5): qty 1

## 2019-01-22 MED ORDER — SODIUM CHLORIDE 0.9 % IV SOLN
INTRAVENOUS | Status: DC
  Administered 2019-01-22: 11:00:00 via INTRAVENOUS

## 2019-01-22 NOTE — Progress Notes (Signed)
  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 Add fiber supplement. May need antidiarrheals as well. Recheck labs tomorrow.   LOS: 2 days  Chief Complaint/Subjective: Tolerating regular diet but has baseline issue with food sticking in esophagus, did not eat much. Has had significant number/volume of diarrhea with occasional blood.   Objective: Vital signs in last 24 hours: Temp:  [97.6 F (36.4 C)-97.7 F (36.5 C)] 97.7 F (36.5 C) (02/15 0609) Pulse Rate:  [82-86] 86 (02/15 0609) Resp:  [20] 20 (02/15 0609) BP: (126-174)/(74-88) 126/87 (02/15 0609) SpO2:  [100 %] 100 % (02/15 0609) Weight:  [40.7 kg] 40.7 kg (02/15 0609) Last BM Date: 01/19/19  Intake/Output from previous day: 02/14 0701 - 02/15 0700 In: 960 [P.O.:960] Out: 501 [Urine:500; Stool:1] Intake/Output this shift: No intake/output data recorded.  Lungs: nonlabored breathing  Cardiovascular: RRR  Abd: soft, ATTP, incisions c/d/i  Extremities: no edema  Neuro: AOx4  Lab Results: CBC  Recent Labs    01/20/19 1142 01/21/19 0430  WBC 10.2 11.0*  HGB 13.0 11.7*  HCT 38.2 35.1*  PLT 240 229   BMET Recent Labs    01/20/19 1142 01/21/19 0430  NA  --  127*  K  --  3.6  CL  --  95*  CO2  --  24  GLUCOSE  --  107*  BUN  --  5*  CREATININE 0.61 0.68  CALCIUM  --  8.6*   PT/INR No results for input(s): LABPROT, INR in the last 72 hours. ABG No results for input(s): PHART, HCO3 in the last 72 hours.  Invalid input(s): PCO2, PO2  Studies/Results:  Anti-infectives: Anti-infectives (From admission, onward)   Start     Dose/Rate Route Frequency Ordered Stop   01/20/19 0600   gentamicin (GARAMYCIN) 210 mg in dextrose 5 % 100 mL IVPB     210 mg 105.3 mL/hr over 60 Minutes Intravenous On call to O.R. 01/19/19 3212 01/20/19 0744   01/19/19 0915  clindamycin (CLEOCIN) IVPB 900 mg     900 mg 100 mL/hr over 30 Minutes Intravenous On call to O.R. 01/19/19 0905 01/20/19 0559      Medications: Scheduled Meds: . celecoxib  200 mg Oral BID  . enoxaparin (LOVENOX) injection  40 mg Subcutaneous Q24H  . polycarbophil  625 mg Oral BID  . pravastatin  20 mg Oral QHS  . vitamin B-12  1,000 mcg Oral Daily   Continuous Infusions: . sodium chloride     PRN Meds:.clidinium-chlordiazePOXIDE, dimenhyDRINATE, diphenhydrAMINE **OR** diphenhydrAMINE, hydrALAZINE, HYDROcodone-acetaminophen, morphine injection, ondansetron **OR** ondansetron (ZOFRAN) IV, simethicone, traMADol  Clovis Riley, MD [facility]

## 2019-01-23 LAB — BASIC METABOLIC PANEL
Anion gap: 7 (ref 5–15)
BUN: <5 mg/dL — ABNORMAL LOW (ref 8–23)
CO2: 24 mmol/L (ref 22–32)
Calcium: 8.1 mg/dL — ABNORMAL LOW (ref 8.9–10.3)
Chloride: 105 mmol/L (ref 98–111)
Creatinine, Ser: 0.63 mg/dL (ref 0.44–1.00)
GFR calc Af Amer: >60 mL/min (ref >60)
Glucose, Bld: 92 mg/dL (ref 70–99)
Potassium: 3.4 mmol/L — ABNORMAL LOW (ref 3.5–5.1)
Sodium: 136 mmol/L (ref 135–145)

## 2019-01-23 LAB — CBC
HCT: 30.5 % — ABNORMAL LOW (ref 36.0–46.0)
Hemoglobin: 9.8 g/dL — ABNORMAL LOW (ref 12.0–15.0)
MCH: 29.5 pg (ref 26.0–34.0)
MCHC: 32.1 g/dL (ref 30.0–36.0)
MCV: 91.9 fL (ref 80.0–100.0)
Platelets: 203 10*3/uL (ref 150–400)
RBC: 3.32 MIL/uL — ABNORMAL LOW (ref 3.87–5.11)
RDW: 12.5 % (ref 11.5–15.5)
WBC: 5 10*3/uL (ref 4.0–10.5)
nRBC: 0 % (ref 0.0–0.2)

## 2019-01-23 LAB — MAGNESIUM: Magnesium: 1.7 mg/dL (ref 1.7–2.4)

## 2019-01-23 MED ORDER — LOPERAMIDE HCL 2 MG PO CAPS: 2.0000 mg | ORAL_CAPSULE | Freq: Three times a day (TID) | ORAL | Status: DC | PRN

## 2019-01-23 MED ORDER — POTASSIUM CHLORIDE 20 MEQ PO PACK
40.0000 meq | PACK | Freq: Two times a day (BID) | ORAL | Status: AC
  Administered 2019-01-23 (×2): 40 meq via ORAL
  Filled 2019-01-23 (×2): qty 2

## 2019-01-23 MED ORDER — MAGNESIUM SULFATE 2 GM/50ML IV SOLN
2.0000 g | Freq: Once | INTRAVENOUS | Status: AC
  Administered 2019-01-23: 2 g via INTRAVENOUS
  Filled 2019-01-23: qty 50

## 2019-01-23 NOTE — Progress Notes (Signed)
  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 Will add antidiarrheals as well. Will SL IVF's and make sure she can keep up with fluid losses -recheck labs in AM   LOS: 3 days  Chief Complaint/Subjective: Tolerating regular diet but has baseline issue with food sticking in esophagus, did not eat much. Has had significant number/volume of diarrhea but no more blood.   Objective: Vital signs in last 24 hours: Temp:  [97.2 F (36.2 C)-97.8 F (36.6 C)] 97.8 F (36.6 C) (02/16 0725) Pulse Rate:  [74-77] 77 (02/16 0725) Resp:  [16-18] 18 (02/16 0725) BP: (129-150)/(57-81) 130/57 (02/16 0725) SpO2:  [100 %] 100 % (02/16 0725) Last BM Date: 01/22/19  Intake/Output from previous day: 02/15 0701 - 02/16 0700 In: 1511.2 [P.O.:780; I.V.:731.2] Out: 300 [Urine:300] Intake/Output this shift: No intake/output data recorded.  Lungs: nonlabored breathing  Cardiovascular: RRR  Abd: soft, ATTP, incisions c/d/i  Extremities: no edema  Neuro: AOx4  Lab Results: CBC  Recent Labs    01/21/19 0430 01/23/19 0430  WBC 11.0* 5.0  HGB 11.7* 9.8*  HCT 35.1* 30.5*  PLT 229 203   BMET Recent Labs    01/21/19 0430 01/23/19 0430  NA 127* 136  K 3.6 3.4*  CL 95* 105  CO2 24 24  GLUCOSE 107* 92  BUN 5* <5*  CREATININE 0.68 0.63  CALCIUM 8.6* 8.1*   PT/INR No results for input(s): LABPROT, INR in the last 72 hours. ABG No results for input(s): PHART, HCO3 in the last 72 hours.  Invalid input(s): PCO2, PO2  Studies/Results:  Anti-infectives: Anti-infectives (From admission, onward)   Start     Dose/Rate Route Frequency Ordered Stop   01/20/19 0600  gentamicin (GARAMYCIN) 210 mg in dextrose 5 % 100 mL  IVPB     210 mg 105.3 mL/hr over 60 Minutes Intravenous On call to O.R. 01/19/19 2993 01/20/19 0744   01/19/19 0915  clindamycin (CLEOCIN) IVPB 900 mg     900 mg 100 mL/hr over 30 Minutes Intravenous On call to O.R. 01/19/19 0905 01/20/19 0559      Medications: Scheduled Meds: . enoxaparin (LOVENOX) injection  40 mg Subcutaneous Q24H  . polycarbophil  625 mg Oral BID  . pravastatin  20 mg Oral QHS  . vitamin B-12  1,000 mcg Oral Daily   Continuous Infusions:  PRN Meds:.clidinium-chlordiazePOXIDE, dimenhyDRINATE, diphenhydrAMINE **OR** diphenhydrAMINE, hydrALAZINE, HYDROcodone-acetaminophen, loperamide, morphine injection, ondansetron **OR** ondansetron (ZOFRAN) IV, simethicone, traMADol  Rosario Adie, MD [facility]

## 2019-01-24 LAB — BASIC METABOLIC PANEL
Anion gap: 5 (ref 5–15)
BUN: <5 mg/dL — ABNORMAL LOW (ref 8–23)
CO2: 24 mmol/L (ref 22–32)
Calcium: 8.2 mg/dL — ABNORMAL LOW (ref 8.9–10.3)
Chloride: 105 mmol/L (ref 98–111)
Creatinine, Ser: 0.6 mg/dL (ref 0.44–1.00)
GFR calc Af Amer: >60 mL/min (ref >60)
GFR calc non Af Amer: >60 mL/min (ref >60)
Glucose, Bld: 97 mg/dL (ref 70–99)
Potassium: 3.9 mmol/L (ref 3.5–5.1)
Sodium: 134 mmol/L — ABNORMAL LOW (ref 135–145)

## 2019-01-24 LAB — CBC
HEMATOCRIT: 33.9 % — AB (ref 36.0–46.0)
Hemoglobin: 10.8 g/dL — ABNORMAL LOW (ref 12.0–15.0)
MCH: 29.8 pg (ref 26.0–34.0)
MCHC: 31.9 g/dL (ref 30.0–36.0)
MCV: 93.4 fL (ref 80.0–100.0)
Platelets: 263 10*3/uL (ref 150–400)
RBC: 3.63 MIL/uL — ABNORMAL LOW (ref 3.87–5.11)
RDW: 12.6 % (ref 11.5–15.5)
WBC: 6.9 10*3/uL (ref 4.0–10.5)
nRBC: 0 % (ref 0.0–0.2)

## 2019-01-24 MED ORDER — CALCIUM POLYCARBOPHIL 625 MG PO TABS: 625.0000 mg | ORAL_TABLET | Freq: Two times a day (BID) | ORAL | 0 refills | Status: DC

## 2019-01-24 MED ORDER — LOPERAMIDE HCL 2 MG PO CAPS: 2.0000 mg | ORAL_CAPSULE | Freq: Three times a day (TID) | ORAL | 0 refills | Status: DC | PRN

## 2019-01-24 NOTE — Progress Notes (Signed)
Discharge instructions given to pt and all questions were answered. Pt taken down via wheelchair and was picked up by her husband.

## 2019-01-24 NOTE — Discharge Summary (Signed)
Physician Discharge Summary  Patient ID: [name] MRN: [id number] DOB/AGE: [birth year] 78 y.o.  Admit date: 01/20/2019 Discharge date: 01/24/2019  Admission Diagnoses:  Discharge Diagnoses:  Active Problems:   Stricture intestinal [facility])   Discharged Condition: good  Hospital Course: 78 yo female underwent small bowel resection. Post op she didi well with return of bowel function POD 2. She tolerated a diet. She ambulated. She had multiple loose bowel movements and was watched to ensure improvement and started on fiber and imodium. She was discharged home POD 4.  Consults: None  Significant Diagnostic Studies:  CBC    Component Value Date/Time   WBC 6.9 01/24/2019 0359   RBC 3.63 (L) 01/24/2019 0359   HGB 10.8 (L) 01/24/2019 0359   HCT 33.9 (L) 01/24/2019 0359   PLT 263 01/24/2019 0359   MCV 93.4 01/24/2019 0359   MCH 29.8 01/24/2019 0359   MCHC 31.9 01/24/2019 0359   RDW 12.6 01/24/2019 0359   LYMPHSABS 1.3 01/17/2019 1117   MONOABS 0.5 01/17/2019 1117   EOSABS 0.2 01/17/2019 1117   BASOSABS 0.0 01/17/2019 1117     Treatments: surgery small bowel resection  Discharge Exam: Blood pressure 120/68, pulse 90, temperature (!) 97.5 F (36.4 C), temperature source Oral, resp. rate 16, height 5' 2"  (1.575 m), weight 40.7 kg, SpO2 99 %. General appearance: alert and cooperative Head: Normocephalic, without obvious abnormality, atraumatic GI: soft, non-tender; bowel sounds normal; no masses,  no organomegaly and incision c/d/i  Disposition: Discharge disposition: 01-Home or Self Care       Discharge Instructions    Call MD for:  difficulty breathing, headache or visual disturbances   Complete by:  As directed    Call MD for:  extreme fatigue   Complete by:  As directed    Call MD for:  persistant dizziness or light-headedness   Complete by:  As directed    Call MD for:  persistant nausea and vomiting   Complete by:  As directed    Call MD for:  temperature  >100.4   Complete by:  As directed    Diet - low sodium heart healthy   Complete by:  As directed    Increase activity slowly   Complete by:  As directed      Allergies as of 01/24/2019      Reactions   Clonazepam Swelling, Other (See Comments)   REACTION: Swollen lips, sore mouth   Penicillins Other (See Comments)   Unrecalled reaction Has patient had a PCN reaction causing immediate rash, facial/tongue/throat swelling, SOB or lightheadedness with hypotension: Unknown Has patient had a PCN reaction causing severe rash involving mucus membranes or skin necrosis: Unknown Has patient had a PCN reaction that required hospitalization: Unknown Has patient had a PCN reaction occurring within the last 10 years: No If all of the above answers are "NO", then may proceed with Cephalosporin use.      Medication List    TAKE these medications   acetaminophen 500 MG tablet Commonly known as:  TYLENOL Take 1,000 mg by mouth every 6 (six) hours as needed for moderate pain or headache.   carbamide peroxide 6.5 % OTIC solution Commonly known as:  DEBROX Place 5 drops into the right ear daily as needed (for wax build up).   clidinium-chlordiazePOXIDE 5-2.5 MG capsule Commonly known as:  LIBRAX Take 1 capsule by mouth 2 (two) times daily as needed. What changed:  reasons to take this   dimenhyDRINATE 50 MG tablet Commonly  known as:  DRAMAMINE Take 50 mg by mouth at bedtime as needed (for sleep).   docusate sodium 100 MG capsule Commonly known as:  COLACE Take 100 mg by mouth every evening.   loperamide 2 MG capsule Commonly known as:  IMODIUM Take 1-2 capsules (2-4 mg total) by mouth every 8 (eight) hours as needed for diarrhea or loose stools (Use if >2 BM every 8 hours).   polycarbophil 625 MG tablet Commonly known as:  FIBERCON Take 1 tablet (625 mg total) by mouth 2 (two) times daily.   Potassium Gluconate 550 MG Tabs Take 550 mg by mouth 3 (three) times a week.   pravastatin  20 MG tablet Commonly known as:  PRAVACHOL Take 1 tablet (20 mg total) by mouth daily. What changed:  when to take this   PROBIOTIC PO Take 1 capsule by mouth daily.   vitamin B-12 1000 MCG tablet Commonly known as:  CYANOCOBALAMIN Take 1,000 mcg by mouth daily.        Signed: Arta Bruce Shaliah Wann 01/24/2019, 10:08 AM

## 2019-03-11 ENCOUNTER — Ambulatory Visit: Payer: Medicare HMO | Admitting: Gastroenterology

## 2019-03-22 ENCOUNTER — Telehealth: Payer: Self-pay | Admitting: Gastroenterology

## 2019-03-22 NOTE — Telephone Encounter (Signed)
Pt would like to know if she can change medication. She is currently on LIBRAX. Would like to speak to someone.

## 2019-03-22 NOTE — Telephone Encounter (Signed)
Dr Silvio Pate, do I need to try and set her up for a VV?

## 2019-03-22 NOTE — Telephone Encounter (Signed)
Pt states the librax is very expensive. She wanted to know if Dr. Loletha Carrow would prescribe the generic for librium. Reports she has anxiety/tremors/not sleeping well and she looked the drug up and it sounds like it would help her. Pt instructed to call her PCP for these issues.

## 2019-03-29 NOTE — Telephone Encounter (Signed)
Patient called the office today to follow up on her My Chart message that was sent last week.   Patient requested a call back PHONE- [phone]

## 2019-03-30 NOTE — Telephone Encounter (Signed)
Please contact her to set up virtual visit to discuss her medication request. thanks

## 2019-03-31 ENCOUNTER — Ambulatory Visit (INDEPENDENT_AMBULATORY_CARE_PROVIDER_SITE_OTHER): Payer: Medicare HMO | Admitting: Internal Medicine

## 2019-03-31 ENCOUNTER — Encounter: Payer: Self-pay | Admitting: Internal Medicine

## 2019-03-31 VITALS — BP 131/73 | HR 88 | Wt 97.0 lb

## 2019-03-31 DIAGNOSIS — K50012 Crohn's disease of small intestine with intestinal obstruction: Secondary | ICD-10-CM | POA: Diagnosis not present

## 2019-03-31 DIAGNOSIS — K5090 Crohn's disease, unspecified, without complications: Secondary | ICD-10-CM | POA: Insufficient documentation

## 2019-03-31 DIAGNOSIS — R251 Tremor, unspecified: Secondary | ICD-10-CM | POA: Diagnosis not present

## 2019-03-31 DIAGNOSIS — E441 Mild protein-calorie malnutrition: Secondary | ICD-10-CM

## 2019-03-31 DIAGNOSIS — F39 Unspecified mood [affective] disorder: Secondary | ICD-10-CM | POA: Diagnosis not present

## 2019-03-31 DIAGNOSIS — R69 Illness, unspecified: Secondary | ICD-10-CM | POA: Diagnosis not present

## 2019-03-31 NOTE — Assessment & Plan Note (Signed)
Has gained back some of her lost weight since her surgery

## 2019-03-31 NOTE — Progress Notes (Signed)
Subjective:    Patient ID: [name], female    DOB: 1941/03/12, 78 y.o.   MRN: [id number]  HPI Virtual visit due to tremor and anxiety Identification done Discussed billing and she gave consent She is in her home and I am in my office Husband is there with her---helps a little (due to her hearing issues despite working on her hearing aides)  She feels a tremendous improvement since her surgery "It is wonderful not to have the physical pain" Appetite is better Has gained back some of her weight--but still not back to her normal  Had clindium/chlordiazepoxide from Dr Loletha Carrow Originally used when she was in great pain and couldn't rest---it didn't help the pain but let her relax (the sedative effect) Used very infrequently from last August till her surgery--mostly to help her sleep It was very pricey---but hasn't even refilled it once  Does note some anxiety This is what will keep her up at times Intermittent "nervousness inside"---will get tremor mostly in left hand She has also used 1/2 of dramamine---this also helped her rest some Using these most nights now No apparent AM sedation (unless she took the whole dramamine---not with the 1/2 dose)  She notes some nervous feelings every day This is new for her Has used chlorazepate in the past  Doesn't feel her handwriting is bad Walks fairly normally Doesn't feel stiff  Current Outpatient Medications on File Prior to Visit  Medication Sig Dispense Refill  . acetaminophen (TYLENOL) 500 MG tablet Take 1,000 mg by mouth every 6 (six) hours as needed for moderate pain or headache.    . carbamide peroxide (DEBROX) 6.5 % OTIC solution Place 5 drops into the right ear daily as needed (for wax build up).    . dimenhyDRINATE (DRAMAMINE) 50 MG tablet Take 50 mg by mouth at bedtime as needed (for sleep).    . docusate sodium (COLACE) 100 MG capsule Take 100 mg by mouth every evening.     . loperamide (IMODIUM) 2 MG capsule Take 1-2  capsules (2-4 mg total) by mouth every 8 (eight) hours as needed for diarrhea or loose stools (Use if >2 BM every 8 hours). 30 capsule 0  . Multiple Vitamin (MULTIVITAMIN) tablet Take 1 tablet by mouth daily.    . polycarbophil (FIBERCON) 625 MG tablet Take 1 tablet (625 mg total) by mouth 2 (two) times daily. 60 tablet 0  . Potassium Gluconate 550 MG TABS Take 550 mg by mouth 3 (three) times a week.    . pravastatin (PRAVACHOL) 20 MG tablet Take 1 tablet (20 mg total) by mouth daily. (Patient taking differently: Take 20 mg by mouth at bedtime. ) 90 tablet 3  . Probiotic Product (PROBIOTIC PO) Take 1 capsule by mouth daily.    . vitamin B-12 (CYANOCOBALAMIN) 1000 MCG tablet Take 1,000 mcg by mouth daily.    . clidinium-chlordiazePOXIDE (LIBRAX) 5-2.5 MG capsule Take 1 capsule by mouth 2 (two) times daily as needed. (Patient not taking: Reported on 03/31/2019) 45 capsule 2   No current facility-administered medications on file prior to visit.     Allergies  Allergen Reactions  . Clonazepam Swelling and Other (See Comments)    REACTION: Swollen lips, sore mouth  . Penicillins Other (See Comments)    Unrecalled reaction Has patient had a PCN reaction causing immediate rash, facial/tongue/throat swelling, SOB or lightheadedness with hypotension: Unknown Has patient had a PCN reaction causing severe rash involving mucus membranes or skin necrosis: Unknown Has patient  had a PCN reaction that required hospitalization: Unknown Has patient had a PCN reaction occurring within the last 10 years: No If all of the above answers are "NO", then may proceed with Cephalosporin use.     Past Medical History:  Diagnosis Date  . Anxiety   . Aortic atherosclerosis ([place])   . GERD (gastroesophageal reflux disease)    Dr. [name]  . H/O abnormal weight loss    lost over 20 lbs in the last year  . 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 History of Bell's palsy 02/2011  . HLD (hyperlipidemia)    Dr.  Lia Foyer  . HOH (hard of hearing)    uses hearing aid on left side/complete loss of hearing right side  . IBS (irritable bowel syndrome)   . LBBB (left bundle branch block)   . Migraine   . OA (osteoarthritis)   . Osteopenia 2005  . Ulcer 2003   bleeding stomach ulcer    Past Surgical History:  Procedure Laterality Date  . ABDOMINAL HYSTERECTOMY  1983  . APPENDECTOMY  1983  . BOWEL RESECTION N/A 01/20/2019   Procedure: DIAGNOSTIC LAP WITH SMALL BOWEL RESECTION;  Surgeon: Kieth Brightly, Arta Bruce, MD;  Location: [facility];  Service: General;  Laterality: N/A;  . [place]   okay, mild changes  . CHOLECYSTECTOMY  1992  . COLONOSCOPY  2003  . DEXA N/A 2005   borderline osteopenia  . DIAGNOSTIC LAPAROSCOPY  11/25/2018  . LAPAROSCOPY N/A 11/25/2018   Procedure: LAPAROSCOPY DIAGNOSTIC;  Surgeon: Kieth Brightly Arta Bruce, MD;  Location: [facility];  Service: General;  Laterality: N/A;  . TONSILLECTOMY AND ADENOIDECTOMY  1953  . UPPER GASTROINTESTINAL ENDOSCOPY  2003    Family History  Problem Relation Age of Onset  . Heart attack Father 34  . Diabetes Father   . Coronary artery disease Father   . COPD Mother   . Hypertension Mother   . Hypertension Sister   . Pulmonary fibrosis Sister   . Cancer Daughter        breast  . Rheum arthritis Daughter   . Colon cancer Neg Hx   . Esophageal cancer Neg Hx   . Rectal cancer Neg Hx     Social History   Socioeconomic History  . Marital status: Married    Spouse name: Jori Moll  . Number of children: 2  . Years of education: Not on file  . Highest education level: Not on file  Occupational History  . Occupation: Retired Medical sales representative and retired from [employer]  . Financial resource strain: Not on file  . Food insecurity:    Worry: Not on file    Inability: Not on file  . Transportation needs:    Medical: Not on file    Non-medical: Not on file  Tobacco Use  . Smoking status: Never Smoker  . Smokeless tobacco:  Never Used  Substance and Sexual Activity  . Alcohol use: No  . Drug use: No  . Sexual activity: Not on file  Lifestyle  . Physical activity:    Days per week: Not on file    Minutes per session: Not on file  . Stress: Not on file  Relationships  . Social connections:    Talks on phone: Not on file    Gets together: Not on file    Attends religious service: Not on file    Active member of club or organization: Not on file  Attends meetings of clubs or organizations: Not on file    Relationship status: Not on file  . Intimate partner violence:    Fear of current or ex partner: Not on file    Emotionally abused: Not on file    Physically abused: Not on file    Forced sexual activity: Not on file  Other Topics Concern  . Not on file  Social History Narrative   Has living will   Husband, then daughters, are [place] health care POA   Would accept resuscitation attempts but no prolonged artifical ventilation   No prolonged tube feeds if cognitively impaired   Review of Systems No N/V Bowels are moving okay--she is used to such chronic problems and now they are soft and brownish color (not black) Has imodium just in case ---hasn't used much    Objective:   Physical Exam  Constitutional: She appears well-developed. No distress.  Respiratory: Effort normal. No respiratory distress.  Neurological:  Very slight resting tremor in left hand (barely perceptible) Handwriting is fairly normal Normal gait No clear bradykinesia  Psychiatric:  Mildly anxious No depression           Assessment & Plan:

## 2019-03-31 NOTE — Assessment & Plan Note (Signed)
Has some anxiety Mood overall better with relief of her pain after resection of damaged/stenotic small intestine Mostly trouble sleeping Discussed concerns of using meds Probably safest alternative is the dramamine she is taking now--but limiting the frequency----she will try to cut it to every other night right away and see how she does Might consider SSRI in low dose if ongoing anxiety in the day persists (she is hoping things will settle down)

## 2019-03-31 NOTE — Assessment & Plan Note (Signed)
Very mild Doesn't have features that suggest Parkinson's ---so reassured Will follow over time and reevaluate at August planned yearly visit (and she will let me know if it worsens in the meantime)

## 2019-03-31 NOTE — Assessment & Plan Note (Signed)
Is better since the surgery Will follow with Dr Loletha Carrow to decide if more Rx needed over time

## 2019-04-05 DIAGNOSIS — I8311 Varicose veins of right lower extremity with inflammation: Secondary | ICD-10-CM | POA: Diagnosis not present

## 2019-04-05 DIAGNOSIS — L900 Lichen sclerosus et atrophicus: Secondary | ICD-10-CM | POA: Diagnosis not present

## 2019-04-05 DIAGNOSIS — L812 Freckles: Secondary | ICD-10-CM | POA: Diagnosis not present

## 2019-04-05 DIAGNOSIS — D1800 Hemangioma unspecified site: Secondary | ICD-10-CM | POA: Diagnosis not present

## 2019-04-05 DIAGNOSIS — Z1283 Encounter for screening for malignant neoplasm of skin: Secondary | ICD-10-CM | POA: Diagnosis not present

## 2019-04-05 DIAGNOSIS — L821 Other seborrheic keratosis: Secondary | ICD-10-CM | POA: Diagnosis not present

## 2019-04-05 DIAGNOSIS — D2239 Melanocytic nevi of other parts of face: Secondary | ICD-10-CM | POA: Diagnosis not present

## 2019-04-05 DIAGNOSIS — D2260 Melanocytic nevi of unspecified upper limb, including shoulder: Secondary | ICD-10-CM | POA: Diagnosis not present

## 2019-04-05 DIAGNOSIS — D225 Melanocytic nevi of trunk: Secondary | ICD-10-CM | POA: Diagnosis not present

## 2019-04-11 DIAGNOSIS — G4700 Insomnia, unspecified: Secondary | ICD-10-CM | POA: Diagnosis not present

## 2019-04-11 DIAGNOSIS — R197 Diarrhea, unspecified: Secondary | ICD-10-CM | POA: Diagnosis not present

## 2019-04-11 DIAGNOSIS — K5090 Crohn's disease, unspecified, without complications: Secondary | ICD-10-CM | POA: Diagnosis not present

## 2019-04-11 DIAGNOSIS — Z8249 Family history of ischemic heart disease and other diseases of the circulatory system: Secondary | ICD-10-CM | POA: Diagnosis not present

## 2019-04-11 DIAGNOSIS — Z836 Family history of other diseases of the respiratory system: Secondary | ICD-10-CM | POA: Diagnosis not present

## 2019-04-11 DIAGNOSIS — E785 Hyperlipidemia, unspecified: Secondary | ICD-10-CM | POA: Diagnosis not present

## 2019-04-25 DIAGNOSIS — R69 Illness, unspecified: Secondary | ICD-10-CM | POA: Diagnosis not present

## 2019-05-16 ENCOUNTER — Other Ambulatory Visit: Payer: Self-pay | Admitting: Internal Medicine

## 2019-06-30 ENCOUNTER — Other Ambulatory Visit: Payer: Self-pay

## 2019-06-30 DIAGNOSIS — Z20822 Contact with and (suspected) exposure to covid-19: Secondary | ICD-10-CM

## 2019-07-03 LAB — NOVEL CORONAVIRUS, NAA: SARS-CoV-2, NAA: NOT DETECTED

## 2019-07-08 ENCOUNTER — Encounter: Payer: Medicare HMO | Admitting: Internal Medicine

## 2019-07-13 ENCOUNTER — Other Ambulatory Visit: Payer: Self-pay

## 2019-07-13 ENCOUNTER — Ambulatory Visit (INDEPENDENT_AMBULATORY_CARE_PROVIDER_SITE_OTHER): Payer: Medicare HMO | Admitting: Internal Medicine

## 2019-07-13 ENCOUNTER — Encounter: Payer: Self-pay | Admitting: Internal Medicine

## 2019-07-13 VITALS — BP 136/82 | HR 89 | Temp 98.5°F | Ht 62.25 in | Wt 102.0 lb

## 2019-07-13 DIAGNOSIS — Z7189 Other specified counseling: Secondary | ICD-10-CM

## 2019-07-13 DIAGNOSIS — R413 Other amnesia: Secondary | ICD-10-CM | POA: Diagnosis not present

## 2019-07-13 DIAGNOSIS — I700 Atherosclerosis of aorta: Secondary | ICD-10-CM | POA: Diagnosis not present

## 2019-07-13 DIAGNOSIS — Z0000 Encounter for general adult medical examination without abnormal findings: Secondary | ICD-10-CM | POA: Diagnosis not present

## 2019-07-13 DIAGNOSIS — R251 Tremor, unspecified: Secondary | ICD-10-CM | POA: Diagnosis not present

## 2019-07-13 DIAGNOSIS — E441 Mild protein-calorie malnutrition: Secondary | ICD-10-CM | POA: Diagnosis not present

## 2019-07-13 DIAGNOSIS — G3184 Mild cognitive impairment, so stated: Secondary | ICD-10-CM | POA: Insufficient documentation

## 2019-07-13 DIAGNOSIS — K50919 Crohn's disease, unspecified, with unspecified complications: Secondary | ICD-10-CM | POA: Diagnosis not present

## 2019-07-13 LAB — CBC
HCT: 42.2 % (ref 36.0–46.0)
Hemoglobin: 13.7 g/dL (ref 12.0–15.0)
MCHC: 32.4 g/dL (ref 30.0–36.0)
MCV: 89.8 fl (ref 78.0–100.0)
Platelets: 239 10*3/uL (ref 150.0–400.0)
RBC: 4.7 Mil/uL (ref 3.87–5.11)
RDW: 15 % (ref 11.5–15.5)
WBC: 6 10*3/uL (ref 4.0–10.5)

## 2019-07-13 LAB — COMPREHENSIVE METABOLIC PANEL
ALT: 12 U/L (ref 0–35)
AST: 17 U/L (ref 0–37)
Albumin: 4.4 g/dL (ref 3.5–5.2)
Alkaline Phosphatase: 64 U/L (ref 39–117)
BUN: 12 mg/dL (ref 6–23)
CO2: 27 mEq/L (ref 19–32)
Calcium: 9.8 mg/dL (ref 8.4–10.5)
Chloride: 103 mEq/L (ref 96–112)
Creatinine, Ser: 0.77 mg/dL (ref 0.40–1.20)
GFR: 72.63 mL/min (ref >60.00)
Glucose, Bld: 95 mg/dL (ref 70–99)
Potassium: 4.8 mEq/L (ref 3.5–5.1)
Sodium: 137 mEq/L (ref 135–145)
Total Bilirubin: 0.4 mg/dL (ref 0.2–1.2)
Total Protein: 7 g/dL (ref 6.0–8.3)

## 2019-07-13 LAB — VITAMIN B12: Vitamin B-12: 425 pg/mL (ref 211–911)

## 2019-07-13 LAB — T4, FREE: Free T4: 0.9 ng/dL (ref 0.60–1.60)

## 2019-07-13 NOTE — Assessment & Plan Note (Signed)
This seems to be controlled now No Rx for now

## 2019-07-13 NOTE — Progress Notes (Signed)
Subjective:    Patient ID: [name], female    DOB: 09-14-1941, 78 y.o.   MRN: [id number]  HPI Here for Medicare wellness and follow up of chronic health conditions Reviewed form and advanced directives Reviewed other doctors No alcohol or tobacco Trying to walk regularly Vision okay Hearing remains poor No falls Not depressed or anhedonic Independent with instrumental ADLs Some concerns about her memory  Still has some tremors Feels it in left > right hands Also feels it in her chest---especially in the AM No change in handwriting of note No change in gait Seemed to progress after second surgery  Still gets some anxiety--but is better overall Mostly just recovering from being sick, surgery, etc  Also notes issues with short term memory She forgets tasks, etc that she has planned Relies on lists Wonders about restarting her MVI  Intestinal issues are quiet No current symptoms Bowels regular--no blood No abdominal pain  Known aortic atherosclerosis Discussed this Unclear if she might be having a problem with the statin No chest pain No palpitations No SOB No dizziness or syncope  Current Outpatient Medications on File Prior to Visit  Medication Sig Dispense Refill   acetaminophen (TYLENOL) 500 MG tablet Take 1,000 mg by mouth every 6 (six) hours as needed for moderate pain or headache.     carbamide peroxide (DEBROX) 6.5 % OTIC solution Place 5 drops into the right ear daily as needed (for wax build up).     dimenhyDRINATE (DRAMAMINE) 50 MG tablet Take 50 mg by mouth at bedtime as needed (for sleep).     loperamide (IMODIUM) 2 MG capsule Take 1-2 capsules (2-4 mg total) by mouth every 8 (eight) hours as needed for diarrhea or loose stools (Use if >2 BM every 8 hours). 30 capsule 0   polycarbophil (FIBERCON) 625 MG tablet Take 1 tablet (625 mg total) by mouth 2 (two) times daily. 60 tablet 0   Potassium Gluconate 550 MG TABS Take 550 mg by mouth 3  (three) times a week.     pravastatin (PRAVACHOL) 20 MG tablet Take 1 tablet (20 mg total) by mouth at bedtime. 90 tablet 3   Probiotic Product (PROBIOTIC PO) Take 1 capsule by mouth daily.     vitamin B-12 (CYANOCOBALAMIN) 1000 MCG tablet Take 1,000 mcg by mouth daily.     No current facility-administered medications on file prior to visit.     Allergies  Allergen Reactions   Clonazepam Swelling and Other (See Comments)    REACTION: Swollen lips, sore mouth   Penicillins Other (See Comments)    Unrecalled reaction Has patient had a PCN reaction causing immediate rash, facial/tongue/throat swelling, SOB or lightheadedness with hypotension: Unknown Has patient had a PCN reaction causing severe rash involving mucus membranes or skin necrosis: Unknown Has patient had a PCN reaction that required hospitalization: Unknown Has patient had a PCN reaction occurring within the last 10 years: No If all of the above answers are "NO", then may proceed with Cephalosporin use.     Past Medical History:  Diagnosis Date   Anxiety    Aortic atherosclerosis (HCC)    GERD (gastroesophageal reflux disease)    Dr. 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 Theodoro Kalata (hard of hearing)  uses hearing aid on left side/complete loss of hearing right side   IBS (irritable bowel syndrome)    LBBB (left bundle branch block)    Migraine    OA (osteoarthritis)    Osteopenia 2005   Ulcer 2003   bleeding stomach ulcer    Past Surgical History:  Procedure Laterality Date   ABDOMINAL HYSTERECTOMY  [place] N/A 01/20/2019   Procedure: DIAGNOSTIC LAP WITH SMALL BOWEL RESECTION;  Surgeon: Kieth Brightly, Arta Bruce, MD;  Location: [facility];  Service: General;  Laterality: N/A;   CARDIAC CATHETERIZATION  1997   okay,  mild changes   CHOLECYSTECTOMY  1992   COLONOSCOPY  2003   DEXA N/A 2005   borderline osteopenia   DIAGNOSTIC LAPAROSCOPY  11/25/2018   LAPAROSCOPY N/A 11/25/2018   Procedure: LAPAROSCOPY DIAGNOSTIC;  Surgeon: Mickeal Skinner, MD;  Location: [facility];  Service: General;  Laterality: N/A;   TONSILLECTOMY AND ADENOIDECTOMY  1953   UPPER GASTROINTESTINAL ENDOSCOPY  2003    Family History  Problem Relation Age of Onset   Heart attack Father 44   Diabetes Father    Coronary artery disease Father    COPD Mother    Hypertension Mother    Hypertension Sister    Pulmonary fibrosis Sister    Cancer Daughter        breast   Rheum arthritis Daughter    Colon cancer Neg Hx    Esophageal cancer Neg Hx    Rectal cancer Neg Hx     Social History   Socioeconomic History   Marital status: Married    Spouse name: Jori Moll   Number of children: 2   Years of education: Not on file   Highest education level: Not on file  Occupational History   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, broadcasting/film/video strain: Not on file   Food insecurity    Worry: Not on file    Inability: Not on Lexicographer needs    Medical: Not on file    Non-medical: Not on file  Tobacco Use   Smoking status: Never Smoker   Smokeless tobacco: Never Used  Substance and Sexual Activity   Alcohol use: No   Drug use: No   Sexual activity: Not on file  Lifestyle   Physical activity    Days per week: Not on file    Minutes per session: Not on file   Stress: Not on file  Relationships   Social connections    Talks on phone: Not on file    Gets together: Not on file    Attends religious service: Not on file    Active member of club or organization: Not on file    Attends meetings of clubs or organizations: Not on file    Relationship status: Not on file   Intimate partner violence    Fear of current or ex partner: Not on file     Emotionally abused: Not on file    Physically abused: Not on file    Forced sexual activity: Not on file  Other Topics Concern   Not on file  Social History Narrative   Has living will   Husband, then daughters, are [place] health care POA   Would accept resuscitation attempts but no prolonged artifical ventilation   No prolonged tube feeds if cognitively impaired   Review of Systems Hasn't been using the dramamine more  than every 2 weeks or so--if she doesn't sleep for a couple of nights Appetite is fair Weight is up some Wears seat belt Teeth okay--sees dentist Keeps up with derm----mostly for lichen sclerosis.  Occasional back ache. No sig joint pains No heartburn or dysphagia (unless she is not careful about chewing carefully)     Objective:   Physical Exam  Constitutional: She is oriented to person, place, and time. She appears well-developed. No distress.  HENT:  Mouth/Throat: Oropharynx is clear and moist. No oropharyngeal exudate.  Neck: No thyromegaly present.  Cardiovascular: Normal rate, regular rhythm, normal heart sounds and intact distal pulses. Exam reveals no gallop.  No murmur heard. Respiratory: Effort normal and breath sounds normal. No respiratory distress. She has no wheezes. She has no rales.  GI: Soft. There is no abdominal tenderness.  Musculoskeletal:        General: No tenderness or edema.  Lymphadenopathy:    She has no cervical adenopathy.  Neurological: She is alert and oriented to person, place, and time.  President--- "[name], Barack Abbe [place] [employer]" [phone] [id number] Recall 3/3  Mild resting tremor in left hand No bradykinesia Normal gait Normal handwriting  Skin: No rash noted. No erythema.  Psychiatric: She has a normal mood and affect. Her behavior is normal.           Assessment & Plan:

## 2019-07-13 NOTE — Assessment & Plan Note (Signed)
Improved Close to her desired weight again

## 2019-07-13 NOTE — Assessment & Plan Note (Signed)
Radiologic diagnosis Has been on statin for primary prevention--but will try off due to memory issues

## 2019-07-13 NOTE — Assessment & Plan Note (Signed)
See social history 

## 2019-07-13 NOTE — Assessment & Plan Note (Signed)
Nothing to suggest Parkinson's ---but could be very early No Rx for now Does have some vague history in the family of tremor

## 2019-07-13 NOTE — Progress Notes (Signed)
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 Wearing a hearing aid in the left ear.  Vision Screening Comments: January 2020

## 2019-07-13 NOTE — Assessment & Plan Note (Signed)
I have personally reviewed the Medicare Annual Wellness questionnaire and have noted 1. The patient's medical and social history 2. Their use of alcohol, tobacco or illicit drugs 3. Their current medications and supplements 4. The patient's functional ability including ADL's, fall risks, home safety risks and hearing or visual             impairment. 5. Diet and physical activities 6. Evidence for depression or mood disorders  The patients weight, height, BMI and visual acuity have been recorded in the chart I have made referrals, counseling and provided education to the patient based review of the above and I have provided the pt with a written personalized care plan for preventive services.  I have provided you with a copy of your personalized plan for preventive services. Please take the time to review along with your updated medication list.  Mammogram due next year Done with colonoscopies unless symptoms Flu vaccine in fall Discussed exercise

## 2019-07-13 NOTE — Assessment & Plan Note (Signed)
Unclear significance Will check labs Stop statin just in case

## 2019-07-27 ENCOUNTER — Encounter: Payer: Self-pay | Admitting: Family Medicine

## 2019-07-27 ENCOUNTER — Ambulatory Visit (INDEPENDENT_AMBULATORY_CARE_PROVIDER_SITE_OTHER): Payer: Medicare HMO | Admitting: Family Medicine

## 2019-07-27 ENCOUNTER — Other Ambulatory Visit: Payer: Self-pay

## 2019-07-27 VITALS — BP 150/80 | HR 90 | Temp 98.5°F | Ht 62.25 in | Wt 103.6 lb

## 2019-07-27 DIAGNOSIS — N3000 Acute cystitis without hematuria: Secondary | ICD-10-CM | POA: Diagnosis not present

## 2019-07-27 DIAGNOSIS — R300 Dysuria: Secondary | ICD-10-CM | POA: Diagnosis not present

## 2019-07-27 LAB — POC URINALSYSI DIPSTICK (AUTOMATED)
Bilirubin, UA: NEGATIVE
Blood, UA: 200
Glucose, UA: NEGATIVE
Ketones, UA: NEGATIVE
Nitrite, UA: NEGATIVE
Protein, UA: POSITIVE — AB
Spec Grav, UA: 1.015 (ref 1.010–1.025)
Urobilinogen, UA: 0.2 E.U./dL
pH, UA: 6.5 (ref 5.0–8.0)

## 2019-07-27 MED ORDER — SULFAMETHOXAZOLE-TRIMETHOPRIM 800-160 MG PO TABS: 1.0000 | ORAL_TABLET | Freq: Two times a day (BID) | ORAL | 0 refills | Status: DC

## 2019-07-27 NOTE — Patient Instructions (Addendum)
Drink lots of water  Take bactrim ds as directed  Stay out of the sun with this medication  If symptoms suddenly worsen let us know   We will call you when the culture comes back   Update if not starting to improve in a week or if worsening   Urinary Tract Infection, Adult  A urinary tract infection (UTI) is an infection of any part of the urinary tract. The urinary tract includes the kidneys, ureters, bladder, and urethra. These organs make, store, and get rid of urine in the body. Your health care provider may use other names to describe the infection. An upper UTI affects the ureters and kidneys (pyelonephritis). A lower UTI affects the bladder (cystitis) and urethra (urethritis). What are the causes? Most urinary tract infections are caused by bacteria in your genital area, around the entrance to your urinary tract (urethra). These bacteria grow and cause inflammation of your urinary tract. What increases the risk? You are more likely to develop this condition if:  You have a urinary catheter that stays in place (indwelling).  You are not able to control when you urinate or have a bowel movement (you have incontinence).  You are female and you: ? Use a spermicide or diaphragm for birth control. ? Have low estrogen levels. ? Are pregnant.  You have certain genes that increase your risk (genetics).  You are sexually active.  You take antibiotic medicines.  You have a condition that causes your flow of urine to slow down, such as: ? An enlarged prostate, if you are female. ? Blockage in your urethra (stricture). ? A kidney stone. ? A nerve condition that affects your bladder control (neurogenic bladder). ? Not getting enough to drink, or not urinating often.  You have certain medical conditions, such as: ? Diabetes. ? A weak disease-fighting system (immunesystem). ? Sickle cell disease. ? Gout. ? Spinal cord injury. What are the signs or symptoms? Symptoms of this  condition include:  Needing to urinate right away (urgently).  Frequent urination or passing small amounts of urine frequently.  Pain or burning with urination.  Blood in the urine.  Urine that smells bad or unusual.  Trouble urinating.  Cloudy urine.  Vaginal discharge, if you are female.  Pain in the abdomen or the lower back. You may also have:  Vomiting or a decreased appetite.  Confusion.  Irritability or tiredness.  A fever.  Diarrhea. The first symptom in older adults may be confusion. In some cases, they may not have any symptoms until the infection has worsened. How is this diagnosed? This condition is diagnosed based on your medical history and a physical exam. You may also have other tests, including:  Urine tests.  Blood tests.  Tests for sexually transmitted infections (STIs). If you have had more than one UTI, a cystoscopy or imaging studies may be done to determine the cause of the infections. How is this treated? Treatment for this condition includes:  Antibiotic medicine.  Over-the-counter medicines to treat discomfort.  Drinking enough water to stay hydrated. If you have frequent infections or have other conditions such as a kidney stone, you may need to see a health care provider who specializes in the urinary tract (urologist). In rare cases, urinary tract infections can cause sepsis. Sepsis is a life-threatening condition that occurs when the body responds to an infection. Sepsis is treated in the hospital with IV antibiotics, fluids, and other medicines. Follow these instructions at home:  Medicines  Take  over-the-counter and prescription medicines only as told by your health care provider.  If you were prescribed an antibiotic medicine, take it as told by your health care provider. Do not stop using the antibiotic even if you start to feel better. General instructions  Make sure you: ? Empty your bladder often and completely. Do not  hold urine for long periods of time. ? Empty your bladder after sex. ? Wipe from front to back after a bowel movement if you are female. Use each tissue one time when you wipe.  Drink enough fluid to keep your urine pale yellow.  Keep all follow-up visits as told by your health care provider. This is important. Contact a health care provider if:  Your symptoms do not get better after 1-2 days.  Your symptoms go away and then return. Get help right away if you have:  Severe pain in your back or your lower abdomen.  A fever.  Nausea or vomiting. Summary  A urinary tract infection (UTI) is an infection of any part of the urinary tract, which includes the kidneys, ureters, bladder, and urethra.  Most urinary tract infections are caused by bacteria in your genital area, around the entrance to your urinary tract (urethra).  Treatment for this condition often includes antibiotic medicines.  If you were prescribed an antibiotic medicine, take it as told by your health care provider. Do not stop using the antibiotic even if you start to feel better.  Keep all follow-up visits as told by your health care provider. This is important. This information is not intended to replace advice given to you by your health care provider. Make sure you discuss any questions you have with your health care provider. Document Released: 09/03/2005 Document Revised: 11/11/2018 Document Reviewed: 06/03/2018 Elsevier Patient Education  2020 [employer].

## 2019-07-27 NOTE — Progress Notes (Signed)
Subjective:    Patient ID: [name], female    DOB: November 14, 1941, 78 y.o.   MRN: [id number]  HPI Here for uti symptoms   78 yo pt of Dr Silvio Pate   Symptoms started last night  Pressure over bladder  Then urge to urinate -it was hard to start  Then she had burning to urinate and sharp pains This repeated all night   No visible blood in urine but it is cloudy  No odor that she notices  Has frequency/urgency   No fever  Did feel cold  No nausea   No new back pain   UA: Results for orders placed or performed in visit on 07/27/19  POCT Urinalysis Dipstick (Automated)  Result Value Ref Range   Color, UA Amber    Clarity, UA Cloudy    Glucose, UA Negative Negative   Bilirubin, UA Negative    Ketones, UA Negative    Spec Grav, UA 1.015 1.010 - 1.025   Blood, UA 200 Ery/uL    pH, UA 6.5 5.0 - 8.0   Protein, UA Positive (A) Negative   Urobilinogen, UA 0.2 0.2 or 1.0 E.U./dL   Nitrite, UA Negative    Leukocytes, UA Large (3+) (A) Negative    She is not prone to uti that she knows of   Patient Active Problem List   Diagnosis Date Noted  . Acute cystitis 07/27/2019  . Memory loss 07/13/2019  . Crohn's disease ([place]) 03/31/2019  . Tremor 03/31/2019  . Malnutrition of mild degree ([place]) 05/22/2018  . Aortic atherosclerosis ([place])   . Advance directive discussed with patient 06/05/2016  . Vitamin B12 deficiency 10/15/2012  . Migraine syndrome 04/16/2012  . Routine general medical examination at a health care facility 04/16/2011  . ACTINIC KERATOSIS 04/02/2010  . OSTEOARTHRITIS 07/20/2007  . Hyperlipemia 07/08/2007  . GERD 07/08/2007  . IBS 07/08/2007  . OSTEOPENIA 07/08/2007   Past Medical History:  Diagnosis Date  . Anxiety   . Aortic atherosclerosis ([place])   . GERD (gastroesophageal reflux disease)    Dr. [name]  . H/O abnormal weight loss    lost over 20 lbs in the last year  . 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 History of Bell's palsy 02/2011  .  HLD (hyperlipidemia)    Dr. Lia Foyer  . HOH (hard of hearing)    uses hearing aid on left side/complete loss of hearing right side  . IBS (irritable bowel syndrome)   . LBBB (left bundle branch block)   . Migraine   . OA (osteoarthritis)   . Osteopenia 2005  . Ulcer 2003   bleeding stomach ulcer   Past Surgical History:  Procedure Laterality Date  . ABDOMINAL HYSTERECTOMY  1983  . APPENDECTOMY  1983  . BOWEL RESECTION N/A 01/20/2019   Procedure: DIAGNOSTIC LAP WITH SMALL BOWEL RESECTION;  Surgeon: Kieth Brightly, Arta Bruce, MD;  Location: [facility];  Service: General;  Laterality: N/A;  . [place]   okay, mild changes  . CHOLECYSTECTOMY  1992  . COLONOSCOPY  2003  . DEXA N/A 2005   borderline osteopenia  . DIAGNOSTIC LAPAROSCOPY  11/25/2018  . LAPAROSCOPY N/A 11/25/2018   Procedure: LAPAROSCOPY DIAGNOSTIC;  Surgeon: Kieth Brightly Arta Bruce, MD;  Location: [facility];  Service: General;  Laterality: N/A;  . TONSILLECTOMY AND ADENOIDECTOMY  1953  . UPPER GASTROINTESTINAL ENDOSCOPY  2003   Social History   Tobacco Use  . Smoking status: Never  Smoker  . Smokeless tobacco: Never Used  Substance Use Topics  . Alcohol use: No  . Drug use: No   Family History  Problem Relation Age of Onset  . Heart attack Father 78  . Diabetes Father   . Coronary artery disease Father   . COPD Mother   . Hypertension Mother   . Hypertension Sister   . Pulmonary fibrosis Sister   . Cancer Daughter        breast  . Rheum arthritis Daughter   . Colon cancer Neg Hx   . Esophageal cancer Neg Hx   . Rectal cancer Neg Hx    Allergies  Allergen Reactions  . Clonazepam Swelling and Other (See Comments)    REACTION: Swollen lips, sore mouth  . Penicillins Other (See Comments)    Unrecalled reaction Has patient had a PCN reaction causing immediate rash, facial/tongue/throat swelling, SOB or lightheadedness with hypotension: Unknown Has patient had a PCN reaction causing severe rash  involving mucus membranes or skin necrosis: Unknown Has patient had a PCN reaction that required hospitalization: Unknown Has patient had a PCN reaction occurring within the last 10 years: No If all of the above answers are "NO", then may proceed with Cephalosporin use.    Current Outpatient Medications on File Prior to Visit  Medication Sig Dispense Refill  . acetaminophen (TYLENOL) 500 MG tablet Take 1,000 mg by mouth every 6 (six) hours as needed for moderate pain or headache.    . carbamide peroxide (DEBROX) 6.5 % OTIC solution Place 5 drops into the right ear daily as needed (for wax build up).    . dimenhyDRINATE (DRAMAMINE) 50 MG tablet Take 50 mg by mouth at bedtime as needed (for sleep).    Marland Kitchen loperamide (IMODIUM) 2 MG capsule Take 1-2 capsules (2-4 mg total) by mouth every 8 (eight) hours as needed for diarrhea or loose stools (Use if >2 BM every 8 hours). 30 capsule 0  . polycarbophil (FIBERCON) 625 MG tablet Take 1 tablet (625 mg total) by mouth 2 (two) times daily. 60 tablet 0  . Potassium Gluconate 550 MG TABS Take 550 mg by mouth 3 (three) times a week.    . Probiotic Product (PROBIOTIC PO) Take 1 capsule by mouth daily.    . vitamin B-12 (CYANOCOBALAMIN) 1000 MCG tablet Take 1,000 mcg by mouth daily.     No current facility-administered medications on file prior to visit.     Review of Systems  Constitutional: Positive for fatigue. Negative for activity change, appetite change and fever.  HENT: Negative for congestion and sore throat.   Eyes: Negative for itching and visual disturbance.  Respiratory: Negative for cough and shortness of breath.   Cardiovascular: Negative for leg swelling.  Gastrointestinal: Negative for abdominal distention, abdominal pain, constipation, diarrhea and nausea.  Endocrine: Negative for cold intolerance and polydipsia.  Genitourinary: Positive for dysuria, frequency and urgency. Negative for difficulty urinating, flank pain and hematuria.   Musculoskeletal: Negative for myalgias.  Skin: Negative for rash.  Allergic/Immunologic: Negative for immunocompromised state.  Neurological: Negative for dizziness and weakness.  Hematological: Negative for adenopathy.       Objective:   Physical Exam Constitutional:      General: She is not in acute distress.    Appearance: Normal appearance. She is well-developed and normal weight. She is not ill-appearing.  HENT:     Head: Normocephalic and atraumatic.  Eyes:     General: No scleral icterus.    Conjunctiva/sclera: Conjunctivae  normal.     Pupils: Pupils are equal, round, and reactive to light.  Neck:     Musculoskeletal: Normal range of motion and neck supple.  Cardiovascular:     Rate and Rhythm: Normal rate and regular rhythm.     Heart sounds: Normal heart sounds.  Pulmonary:     Effort: Pulmonary effort is normal. No respiratory distress.     Breath sounds: Normal breath sounds. No wheezing or rales.  Abdominal:     General: Bowel sounds are normal. There is no distension.     Palpations: Abdomen is soft.     Tenderness: There is abdominal tenderness. There is no rebound.     Comments: No cva tenderness  Mild suprapubic tenderness  Lymphadenopathy:     Cervical: No cervical adenopathy.  Skin:    Findings: No rash.  Neurological:     Mental Status: She is alert.  Psychiatric:        Mood and Affect: Mood normal.           Assessment & Plan:   Problem List Items Addressed This Visit      Genitourinary   Acute cystitis - Primary    Uncomplicated in pt with pcn allergy  tx with bactrim ds for 5 d  Enc water intake Handout given  inst to call if symptoms worsen Urine cx pending       Relevant Orders   Urine Culture    Other Visit Diagnoses    Dysuria       Relevant Orders   POCT Urinalysis Dipstick (Automated) (Completed)

## 2019-07-27 NOTE — Assessment & Plan Note (Signed)
Uncomplicated in pt with pcn allergy  tx with bactrim ds for 5 d  Enc water intake Handout given  inst to call if symptoms worsen Urine cx pending

## 2019-07-29 ENCOUNTER — Other Ambulatory Visit: Payer: Self-pay | Admitting: Family Medicine

## 2019-07-29 LAB — URINE CULTURE
MICRO NUMBER:: 788686
SPECIMEN QUALITY:: ADEQUATE

## 2019-07-29 MED ORDER — NITROFURANTOIN MONOHYD MACRO 100 MG PO CAPS: 100.0000 mg | ORAL_CAPSULE | Freq: Two times a day (BID) | ORAL | 0 refills | Status: DC

## 2019-07-29 NOTE — Telephone Encounter (Signed)
Please tell pt that her uti is resistant to the bactrim I gave her  I will pend px for macrobid to send to her preferred pharmacy-please send Tell her to stop the bactrim Please let us know if no clinical improvement

## 2019-07-29 NOTE — Telephone Encounter (Signed)
Patient advised and RX sent to [facility] in [place]

## 2019-08-11 ENCOUNTER — Other Ambulatory Visit: Payer: Self-pay

## 2019-08-11 ENCOUNTER — Ambulatory Visit (INDEPENDENT_AMBULATORY_CARE_PROVIDER_SITE_OTHER): Payer: Medicare HMO

## 2019-08-11 DIAGNOSIS — Z23 Encounter for immunization: Secondary | ICD-10-CM | POA: Diagnosis not present

## 2019-09-10 DIAGNOSIS — L900 Lichen sclerosus et atrophicus: Secondary | ICD-10-CM | POA: Diagnosis not present

## 2019-10-04 DIAGNOSIS — L900 Lichen sclerosus et atrophicus: Secondary | ICD-10-CM | POA: Diagnosis not present

## 2019-10-27 DIAGNOSIS — R69 Illness, unspecified: Secondary | ICD-10-CM | POA: Diagnosis not present

## 2019-11-24 DIAGNOSIS — R69 Illness, unspecified: Secondary | ICD-10-CM | POA: Diagnosis not present

## 2020-02-06 ENCOUNTER — Encounter: Payer: Self-pay | Admitting: Family Medicine

## 2020-02-06 ENCOUNTER — Other Ambulatory Visit: Payer: Self-pay

## 2020-02-06 ENCOUNTER — Ambulatory Visit (INDEPENDENT_AMBULATORY_CARE_PROVIDER_SITE_OTHER): Payer: Medicare HMO | Admitting: Family Medicine

## 2020-02-06 DIAGNOSIS — M791 Myalgia, unspecified site: Secondary | ICD-10-CM

## 2020-02-06 MED ORDER — METHOCARBAMOL 500 MG PO TABS: 500.0000 mg | ORAL_TABLET | Freq: Three times a day (TID) | ORAL | 1 refills | Status: DC | PRN

## 2020-02-06 MED ORDER — CALCIUM POLYCARBOPHIL 625 MG PO TABS: 625.0000 mg | ORAL_TABLET | Freq: Two times a day (BID) | ORAL | Status: DC | PRN

## 2020-02-06 NOTE — Patient Instructions (Signed)
Keep using heat as needed and try robaxin.  Gently stretch and update [place] as needed.  Take care.  Glad to see you.

## 2020-02-06 NOTE — Progress Notes (Signed)
This visit occurred during the SARS-CoV-2 public health emergency.  Safety protocols were in place, including screening questions prior to the visit, additional usage of staff PPE, and extensive cleaning of exam room while observing appropriate contact time as indicated for disinfecting solutions.  Several weeks of R shoulder/trapezius pain.  Unclear trigger.  Off an on pain/discomfort. Recently puffy in the area.  No bruising, no redness. No L sided sx.  She has some occ neck soreness.  No arm pain.  She is L handed.  No radicular pain.  No FCNAVD.  Used heating pad with some relief, temporarily.    Meds, vitals, and allergies reviewed.   ROS: Per HPI unless specifically indicated in ROS section   GEN: nad, alert and oriented HEENT: ncat NECK: supple w/o LA CV: rrr.  no murmur PULM: ctab, no inc wob EXT: no edema SKIN: Well-perfused Normal shoulder range of motion without arm drop.  Normal internal and external rotation.  She does have right sided trapezius spasm noted without bruising.

## 2020-02-08 DIAGNOSIS — M791 Myalgia, unspecified site: Secondary | ICD-10-CM | POA: Insufficient documentation

## 2020-02-08 NOTE — Assessment & Plan Note (Signed)
Discussed options.  No emergent findings.  No need for imaging at this point Keep using heat as needed and she can try robaxin.  Gently stretch and update [place] as needed.  She agrees with plan.

## 2020-02-17 ENCOUNTER — Other Ambulatory Visit: Payer: Self-pay | Admitting: Internal Medicine

## 2020-02-17 DIAGNOSIS — Z1231 Encounter for screening mammogram for malignant neoplasm of breast: Secondary | ICD-10-CM

## 2020-02-21 DIAGNOSIS — Z8249 Family history of ischemic heart disease and other diseases of the circulatory system: Secondary | ICD-10-CM | POA: Diagnosis not present

## 2020-02-21 DIAGNOSIS — Z803 Family history of malignant neoplasm of breast: Secondary | ICD-10-CM | POA: Diagnosis not present

## 2020-02-21 DIAGNOSIS — I739 Peripheral vascular disease, unspecified: Secondary | ICD-10-CM | POA: Diagnosis not present

## 2020-02-21 DIAGNOSIS — Z008 Encounter for other general examination: Secondary | ICD-10-CM | POA: Diagnosis not present

## 2020-03-14 ENCOUNTER — Ambulatory Visit
Admission: RE | Admit: 2020-03-14 | Discharge: 2020-03-14 | Disposition: A | Discharge status: Home / Self Care | Payer: Medicare HMO | Source: Ambulatory Visit | Attending: Internal Medicine | Admitting: Internal Medicine

## 2020-03-14 ENCOUNTER — Other Ambulatory Visit: Payer: Self-pay

## 2020-03-14 DIAGNOSIS — Z1231 Encounter for screening mammogram for malignant neoplasm of breast: Secondary | ICD-10-CM | POA: Diagnosis not present

## 2020-03-14 IMAGING — MG DIGITAL SCREENING BILAT W/ TOMO W/ CAD
8 series · 8 of 24 positions shown · non-contrast
Comparison: Previous exam(s).

CLINICAL DATA: Screening.

EXAM:
DIGITAL SCREENING BILATERAL MAMMOGRAM WITH TOMO AND CAD

[R MLO synth-2D]
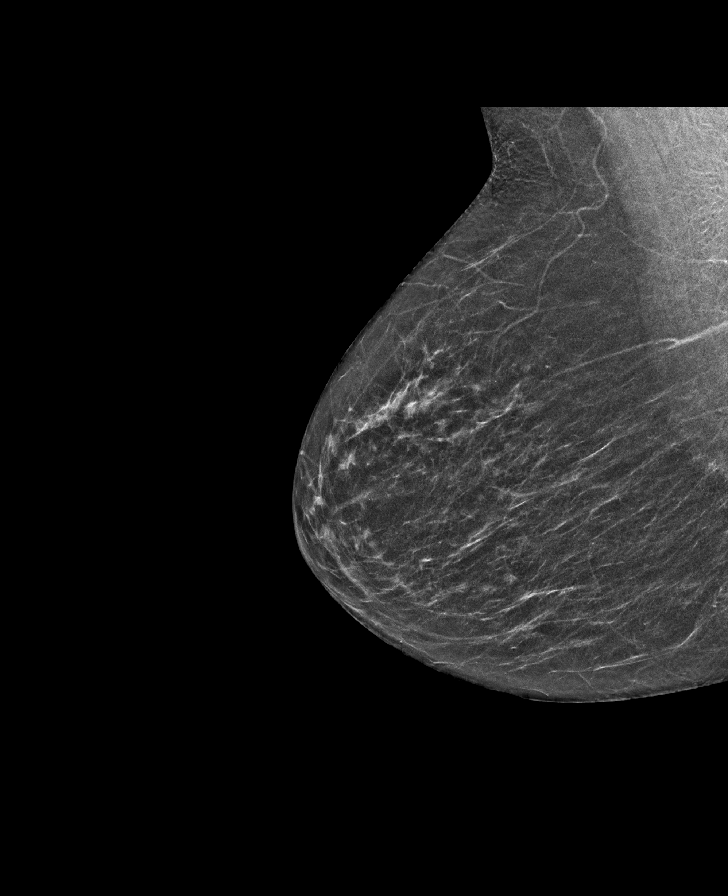

[L CC synth-2D]
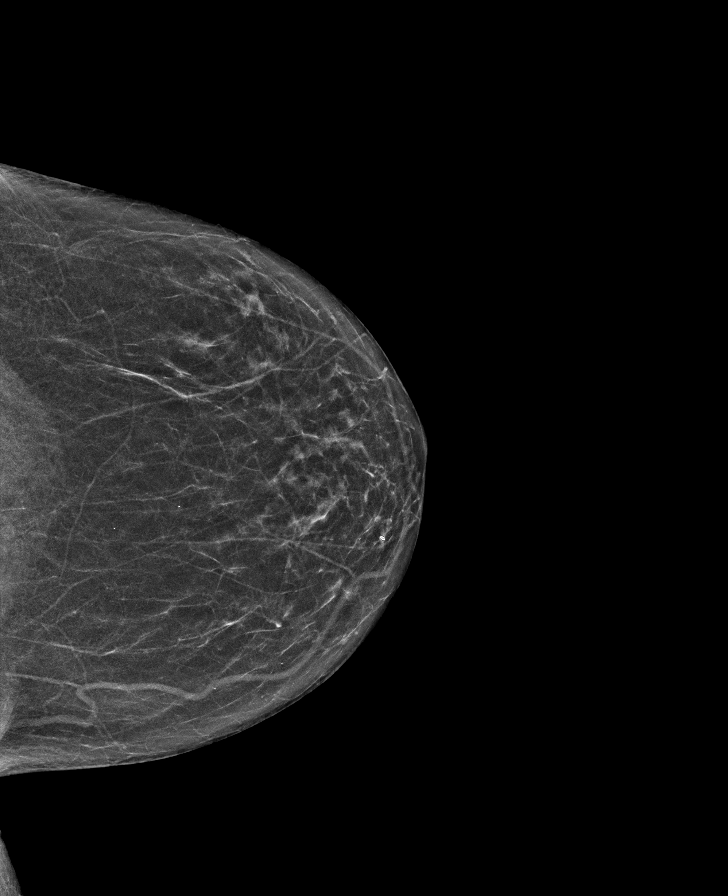

[R CC synth-2D]
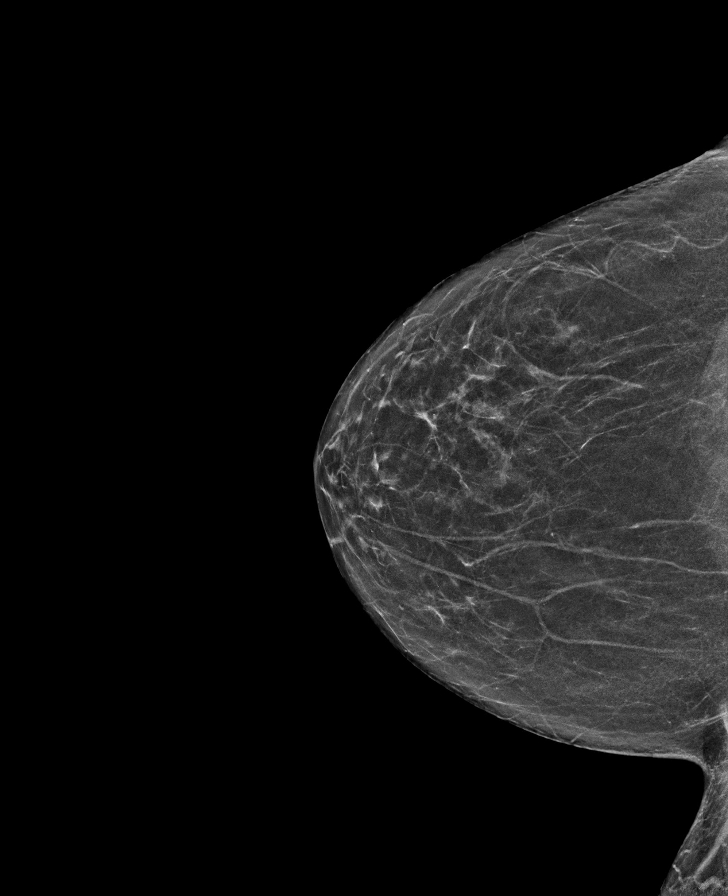

[L MLO synth-2D]
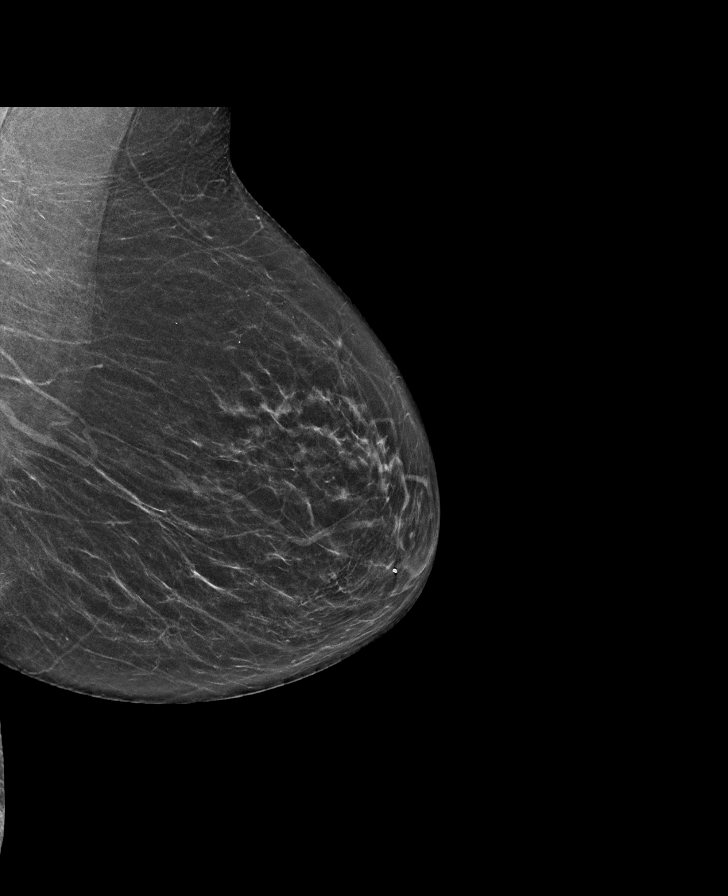

[R CC tomo · tomo slice 28/55.0]
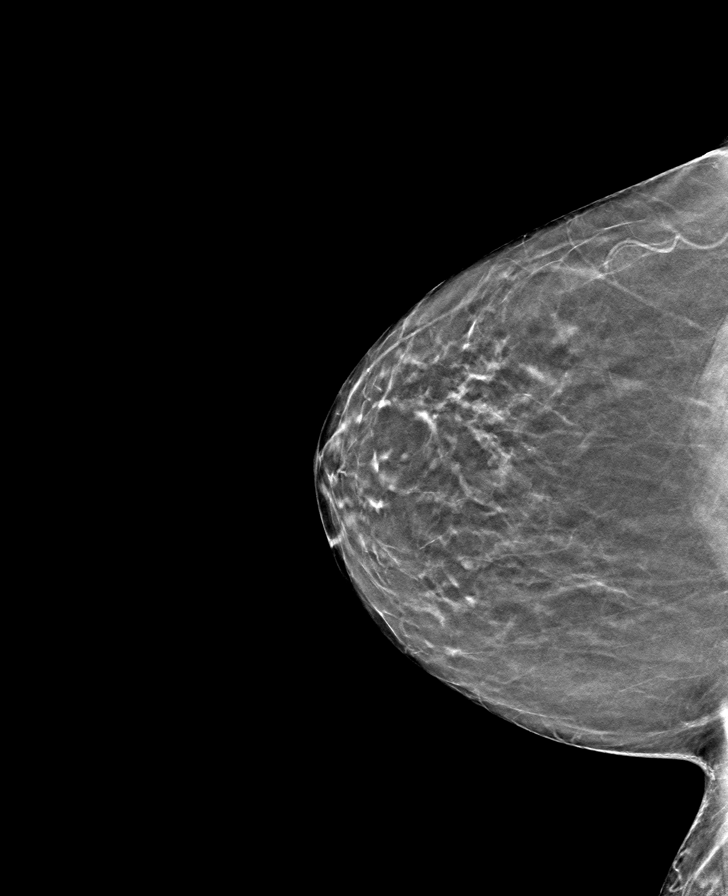

[R MLO tomo · tomo slice 27/53.0]
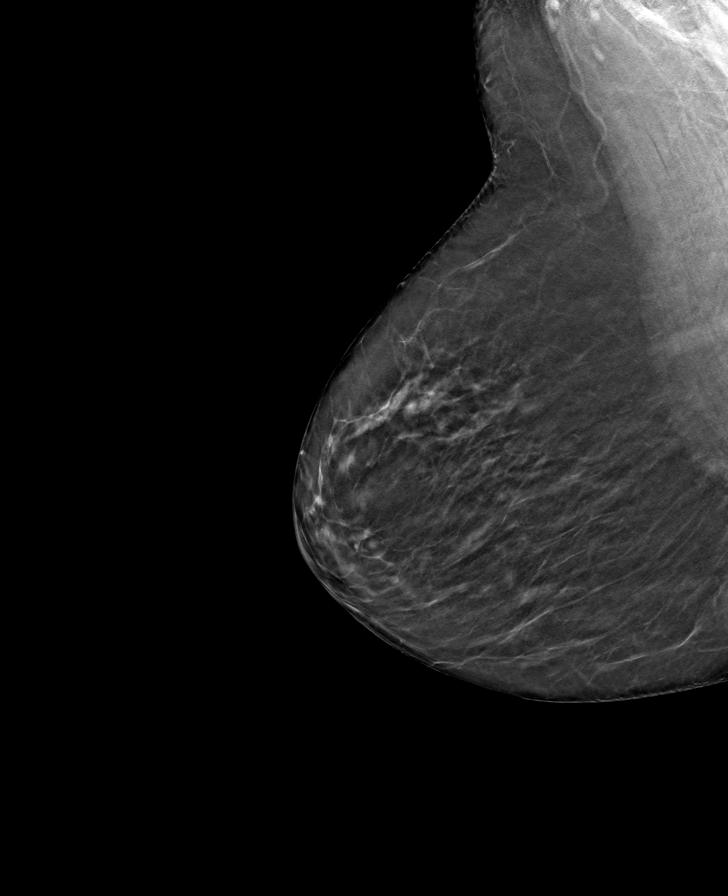

[L CC tomo · tomo slice 27/53.0]
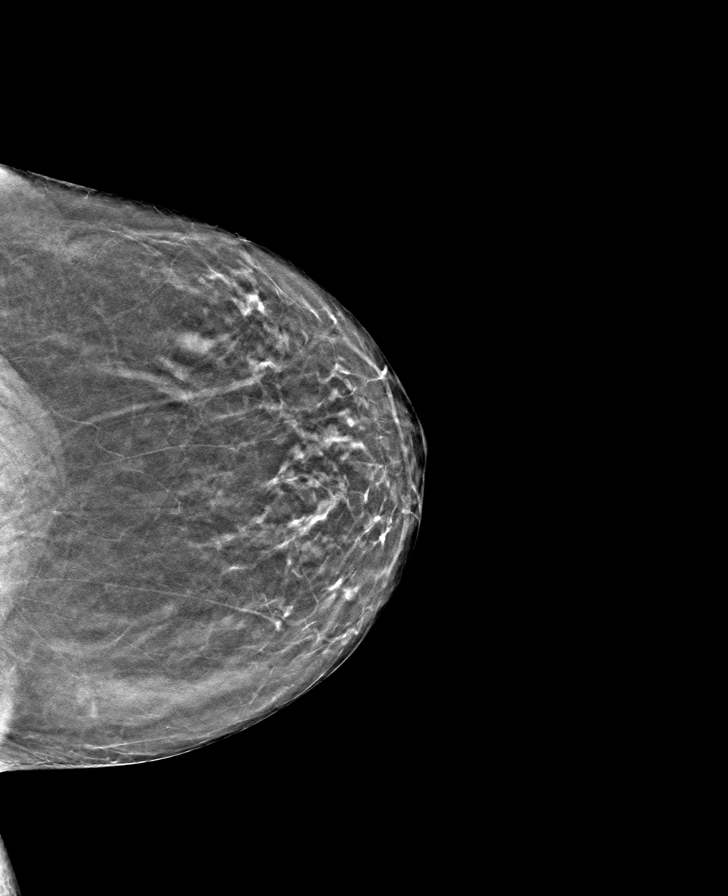

[L MLO tomo · tomo slice 29/58.0]
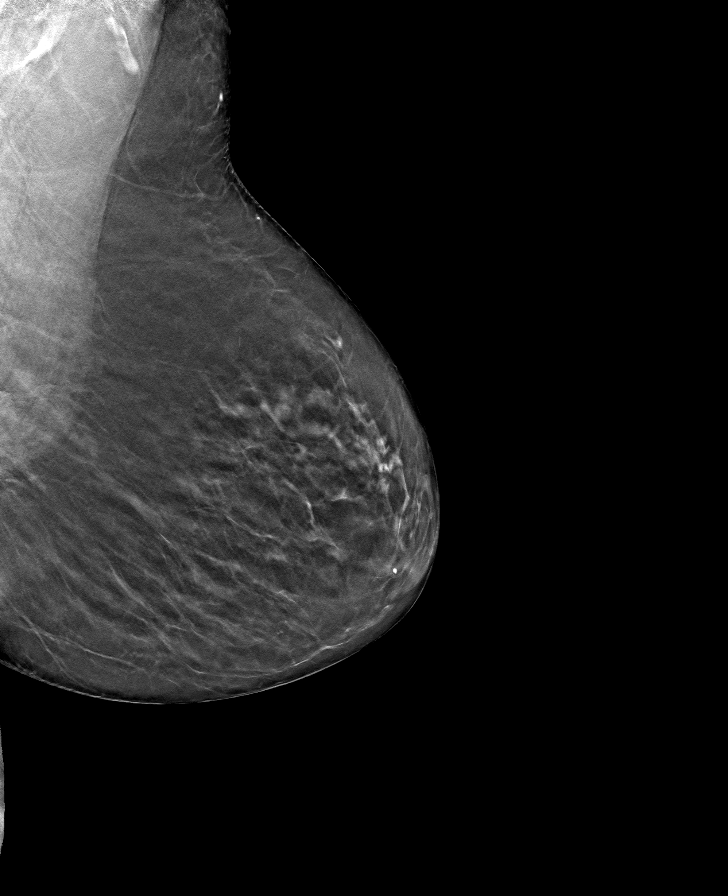

[8 of 24 positions shown; findings below may reference images not displayed]

ACR Breast Density Category b: There are scattered areas of
fibroglandular density.
FINDINGS: There are no findings suspicious for malignancy. Images were
processed with CAD.
IMPRESSION: No mammographic evidence of malignancy. A result letter of this
screening mammogram will be mailed directly to the patient.

RECOMMENDATION:
Screening mammogram in one year. (Code:[TQ])

BI-RADS CATEGORY  1: Negative.

## 2020-04-03 ENCOUNTER — Other Ambulatory Visit: Payer: Self-pay

## 2020-04-03 ENCOUNTER — Ambulatory Visit (INDEPENDENT_AMBULATORY_CARE_PROVIDER_SITE_OTHER): Payer: Medicare HMO | Admitting: Dermatology

## 2020-04-03 DIAGNOSIS — L900 Lichen sclerosus et atrophicus: Secondary | ICD-10-CM | POA: Diagnosis not present

## 2020-04-03 NOTE — Patient Instructions (Signed)
Topical steroids (such as triamcinolone, fluocinolone, fluocinonide, mometasone, clobetasol, halobetasol, betamethasone, hydrocortisone) can cause thinning and lightening of the skin if they are used for too long in the same area. Your physician has selected the right strength medicine for your problem and area affected on the body. Please use your medication only as directed by your physician to prevent side effects.   Marland Kitchen

## 2020-04-03 NOTE — Progress Notes (Signed)
   Follow-Up Visit   Subjective  [name] is a 79 y.o. female who presents for the following: Follow-up.  Patient here today for 6 month follow up. She is being treated for LS & A of the vaginal area. Patient controlled using TMC 0.1% ointment 3 times a week. No itching, pain, or burning.  The following portions of the chart were reviewed this encounter and updated as appropriate:     Review of Systems:  No other skin or systemic complaints except as noted in HPI or Assessment and Plan.  Objective  Well appearing patient in no apparent distress; mood and affect are within normal limits.  A focused examination was performed including vaginal area. Relevant physical exam findings are noted in the Assessment and Plan.  Objective  Pubic: Erythema at inferior labia minora and inferior introitus, mild perirectal erythema (patient not applying topical steroid in this location)   Assessment & Plan  Lichen sclerosus et atrophicus Pubic  Controlled Continue TMC 0.1% ointment 3 times weekly to affected vaginal area, increasing with flares.  Return in about 6 months (around 10/03/2020) for LS & A.  Graciella Belton, RMA, am acting as scribe for Brendolyn Patty, MD .  Documentation: I have reviewed the above documentation for accuracy and completeness, and I agree with the above.  Brendolyn Patty, MD

## 2020-04-10 DIAGNOSIS — H25043 Posterior subcapsular polar age-related cataract, bilateral: Secondary | ICD-10-CM | POA: Diagnosis not present

## 2020-05-03 DIAGNOSIS — R69 Illness, unspecified: Secondary | ICD-10-CM | POA: Diagnosis not present

## 2020-06-04 DIAGNOSIS — R69 Illness, unspecified: Secondary | ICD-10-CM | POA: Diagnosis not present

## 2020-07-17 ENCOUNTER — Other Ambulatory Visit: Payer: Self-pay

## 2020-07-17 ENCOUNTER — Encounter: Payer: Self-pay | Admitting: Internal Medicine

## 2020-07-17 ENCOUNTER — Ambulatory Visit (INDEPENDENT_AMBULATORY_CARE_PROVIDER_SITE_OTHER): Payer: Medicare HMO | Admitting: Internal Medicine

## 2020-07-17 VITALS — BP 140/86 | HR 94 | Temp 97.6°F | Ht 62.0 in | Wt 118.0 lb

## 2020-07-17 DIAGNOSIS — K5000 Crohn's disease of small intestine without complications: Secondary | ICD-10-CM | POA: Diagnosis not present

## 2020-07-17 DIAGNOSIS — Z7189 Other specified counseling: Secondary | ICD-10-CM | POA: Diagnosis not present

## 2020-07-17 DIAGNOSIS — Z0000 Encounter for general adult medical examination without abnormal findings: Secondary | ICD-10-CM

## 2020-07-17 DIAGNOSIS — I700 Atherosclerosis of aorta: Secondary | ICD-10-CM

## 2020-07-17 DIAGNOSIS — R413 Other amnesia: Secondary | ICD-10-CM | POA: Diagnosis not present

## 2020-07-17 DIAGNOSIS — K219 Gastro-esophageal reflux disease without esophagitis: Secondary | ICD-10-CM | POA: Diagnosis not present

## 2020-07-17 DIAGNOSIS — R69 Illness, unspecified: Secondary | ICD-10-CM | POA: Diagnosis not present

## 2020-07-17 DIAGNOSIS — F39 Unspecified mood [affective] disorder: Secondary | ICD-10-CM

## 2020-07-17 LAB — CBC
HCT: 40.8 % (ref 36.0–46.0)
Hemoglobin: 13.7 g/dL (ref 12.0–15.0)
MCHC: 33.5 g/dL (ref 30.0–36.0)
MCV: 90.8 fl (ref 78.0–100.0)
Platelets: 236 10*3/uL (ref 150.0–400.0)
RBC: 4.5 Mil/uL (ref 3.87–5.11)
RDW: 13.8 % (ref 11.5–15.5)
WBC: 6.3 10*3/uL (ref 4.0–10.5)

## 2020-07-17 LAB — COMPREHENSIVE METABOLIC PANEL
ALT: 13 U/L (ref 0–35)
AST: 18 U/L (ref 0–37)
Albumin: 4.2 g/dL (ref 3.5–5.2)
Alkaline Phosphatase: 68 U/L (ref 39–117)
BUN: 8 mg/dL (ref 6–23)
CO2: 28 mEq/L (ref 19–32)
Calcium: 9.3 mg/dL (ref 8.4–10.5)
Chloride: 101 mEq/L (ref 96–112)
Creatinine, Ser: 0.81 mg/dL (ref 0.40–1.20)
GFR: 68.33 mL/min (ref >60.00)
Glucose, Bld: 97 mg/dL (ref 70–99)
Potassium: 4.8 mEq/L (ref 3.5–5.1)
Sodium: 138 mEq/L (ref 135–145)
Total Bilirubin: 0.4 mg/dL (ref 0.2–1.2)
Total Protein: 6.4 g/dL (ref 6.0–8.3)

## 2020-07-17 LAB — VITAMIN B12: Vitamin B-12: 469 pg/mL (ref 211–911)

## 2020-07-17 LAB — T4, FREE: Free T4: 1.12 ng/dL (ref 0.60–1.60)

## 2020-07-17 NOTE — Assessment & Plan Note (Signed)
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

## 2020-07-17 NOTE — Progress Notes (Signed)
Subjective:    Patient ID: [name], female    DOB: August 05, 1941, 79 y.o.   MRN: [id number]  HPI Here for Medicare wellness visit and follow up of chronic health conditions This visit occurred during the SARS-CoV-2 public health emergency.  Safety protocols were in place, including screening questions prior to the visit, additional usage of staff PPE, and extensive cleaning of exam room while observing appropriate contact time as indicated for disinfecting solutions.   Reviewed form and advanced directives Reviewed other doctors No alcohol or tobacco Tries to walk ---but not often. 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 No falls Independent with instrumental ADLs  Still gets anxiety--doesn't limit her Now focused about COVID issues (did have COVID vaccine)  Teary eyed at times---like thinking about kids Not clearly depressed Not anhedonic  Ongoing short term memory loss No clear progression No obvious functional decline (may forget about turning oven on though) Stopping the statin didn't make any difference  Reviewed the aortic atherosclerosis Not excited about going back on statin  Crohn's seems quiet Surgery removed active component Can eat anything---but may have rectal urgency with some food (uses imodium prn) No blood in stool---still soft or loose  Current Outpatient Medications on File Prior to Visit  Medication Sig Dispense Refill  . acetaminophen (TYLENOL) 500 MG tablet Take 1,000 mg by mouth every 6 (six) hours as needed for moderate pain or headache.    . carbamide peroxide (DEBROX) 6.5 % OTIC solution Place 5 drops into the right ear daily as needed (for wax build up).    . dimenhyDRINATE (DRAMAMINE) 50 MG tablet Take 50 mg by mouth at bedtime as needed (for sleep).    Marland Kitchen loperamide (IMODIUM) 2 MG capsule Take 1-2 capsules (2-4 mg total) by mouth every 8  (eight) hours as needed for diarrhea or loose stools (Use if >2 BM every 8 hours). 30 capsule 0  . methocarbamol (ROBAXIN) 500 MG tablet Take 1 tablet (500 mg total) by mouth every 8 (eight) hours as needed for muscle spasms. 30 tablet 1  . Multiple Vitamin (MULTIVITAMIN) tablet Take 1 tablet by mouth daily.    . polycarbophil (FIBERCON) 625 MG tablet Take 1 tablet (625 mg total) by mouth 2 (two) times daily as needed.    . Potassium Gluconate 550 MG TABS Take 550 mg by mouth 3 (three) times a week.    . Probiotic Product (PROBIOTIC PO) Take 1 capsule by mouth daily.    . vitamin B-12 (CYANOCOBALAMIN) 1000 MCG tablet Take 1,000 mcg by mouth daily.     No current facility-administered medications on file prior to visit.    Allergies  Allergen Reactions  . Clonazepam Swelling and Other (See Comments)    REACTION: Swollen lips, sore mouth  . Penicillins Other (See Comments)    Unrecalled reaction Has patient had a PCN reaction causing immediate rash, facial/tongue/throat swelling, SOB or lightheadedness with hypotension: Unknown Has patient had a PCN reaction causing severe rash involving mucus membranes or skin necrosis: Unknown Has patient had a PCN reaction that required hospitalization: Unknown Has patient had a PCN reaction occurring within the last 10 years: No If all of the above answers are "NO", then may proceed with Cephalosporin use.     Past Medical History:  Diagnosis Date  . Anxiety   . Aortic atherosclerosis ([place])   . GERD (gastroesophageal reflux disease)  Dr. [name]  . H/O abnormal weight loss    lost over 20 lbs in the last year  . 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 History of Bell's palsy 02/2011  . HLD (hyperlipidemia)    Dr. Lia Foyer  . HOH (hard of hearing)    uses hearing aid on left side/complete loss of hearing right side  . IBS (irritable bowel syndrome)   . LBBB (left bundle branch block)   . Migraine   . OA (osteoarthritis)   . Osteopenia  2005  . Ulcer 2003   bleeding stomach ulcer    Past Surgical History:  Procedure Laterality Date  . ABDOMINAL HYSTERECTOMY  1983  . APPENDECTOMY  1983  . BOWEL RESECTION N/A 01/20/2019   Procedure: DIAGNOSTIC LAP WITH SMALL BOWEL RESECTION;  Surgeon: Kieth Brightly, Arta Bruce, MD;  Location: [facility];  Service: General;  Laterality: N/A;  . [place]   okay, mild changes  . CHOLECYSTECTOMY  1992  . COLONOSCOPY  2003  . DEXA N/A 2005   borderline osteopenia  . DIAGNOSTIC LAPAROSCOPY  11/25/2018  . LAPAROSCOPY N/A 11/25/2018   Procedure: LAPAROSCOPY DIAGNOSTIC;  Surgeon: Kieth Brightly Arta Bruce, MD;  Location: [facility];  Service: General;  Laterality: N/A;  . TONSILLECTOMY AND ADENOIDECTOMY  1953  . UPPER GASTROINTESTINAL ENDOSCOPY  2003    Family History  Problem Relation Age of Onset  . Heart attack Father 54  . Diabetes Father   . Coronary artery disease Father   . COPD Mother   . Hypertension Mother   . Hypertension Sister   . Pulmonary fibrosis Sister   . Cancer Daughter        breast  . Rheum arthritis Daughter   . Colon cancer Neg Hx   . Esophageal cancer Neg Hx   . Rectal cancer Neg Hx     Social History   Socioeconomic History  . Marital status: Married    Spouse name: Jori Moll  . Number of children: 2  . Years of education: Not on file  . Highest education level: Not on file  Occupational History  . Occupation: Retired Medical sales representative and retired from [employer]  . Smoking status: Never Smoker  . Smokeless tobacco: Never Used  Vaping Use  . Vaping Use: Never used  Substance and Sexual Activity  . Alcohol use: No  . Drug use: No  . Sexual activity: Not on file  Other Topics Concern  . Not on file  Social History Narrative   Has living will   Husband, then daughters, are [place] health care POA   Would accept resuscitation attempts but no prolonged artifical ventilation   No prolonged tube feeds if cognitively impaired   Social  Determinants of Health   Financial Resource Strain:   . Difficulty of Paying Living Expenses:   Food Insecurity:   . Worried About Charity fundraiser in the Last Year:   . Arboriculturist in the Last Year:   Transportation Needs:   . Film/video editor (Medical):   Marland Kitchen Lack of Transportation (Non-Medical):   Physical Activity:   . Days of Exercise per Week:   . Minutes of Exercise per Session:   Stress:   . Feeling of Stress :   Social Connections:   . Frequency of Communication with Friends and Family:   . Frequency of Social Gatherings with Friends and Family:   . Attends Religious Services:   .  Active Member of Clubs or Organizations:   . Attends Archivist Meetings:   Marland Kitchen Marital Status:   Intimate Partner Violence:   . Fear of Current or Ex-Partner:   . Emotionally Abused:   Marland Kitchen Physically Abused:   . Sexually Abused:    Review of Systems Appetite is good Weight back up again Sleeps fairly well Wears seat belt Teeth are okay----keeps up with dentist Yearly skin check--no issues now No regular heartburn. Some swallowing issues if she eats too fast No hematuria or dysuria. No incontinence No chest pain or SOB Occasional mild dizziness--no syncope    Objective:   Physical Exam Constitutional:      Appearance: Normal appearance.  HENT:     Mouth/Throat:     Comments: No oral lesions Eyes:     Conjunctiva/sclera: Conjunctivae normal.     Pupils: Pupils are equal, round, and reactive to light.  Cardiovascular:     Rate and Rhythm: Normal rate and regular rhythm.     Heart sounds: No murmur heard.  No gallop.      Comments: Faint pedal pulses Pulmonary:     Effort: Pulmonary effort is normal.     Breath sounds: Normal breath sounds. No wheezing or rales.  Abdominal:     Palpations: Abdomen is soft.     Tenderness: There is no abdominal tenderness.  Musculoskeletal:     Cervical back: Neck supple.     Comments: Trace right ankle edema None on left   Lymphadenopathy:     Cervical: No cervical adenopathy.  Skin:    Findings: No rash.  Neurological:     Mental Status: She is alert and oriented to person, place, and time.     Comments: President--- "Waymond Cera, Obama" [phone] [id number] Recall 3/3  Psychiatric:        Mood and Affect: Mood normal.        Behavior: Behavior normal.            Assessment & Plan:

## 2020-07-17 NOTE — Assessment & Plan Note (Signed)
No regular heartburn but occ dysphagia Discussed starting omeprazole if regular symptoms

## 2020-07-17 NOTE — Patient Instructions (Signed)
If you have more trouble swallowing, you should start a daily omeprazole 57m on an empty stomach (you can get this over the counter).

## 2020-07-17 NOTE — Assessment & Plan Note (Signed)
Seems to be quiet now No Rx

## 2020-07-17 NOTE — Assessment & Plan Note (Signed)
I have personally reviewed the Medicare Annual Wellness questionnaire and have noted 1. The patient's medical and social history 2. Their use of alcohol, tobacco or illicit drugs 3. Their current medications and supplements 4. The patient's functional ability including ADL's, fall risks, home safety risks and hearing or visual             impairment. 5. Diet and physical activities 6. Evidence for depression or mood disorders  The patients weight, height, BMI and visual acuity have been recorded in the chart I have made referrals, counseling and provided education to the patient based review of the above and I have provided the pt with a written personalized care plan for preventive services.  I have provided you with a copy of your personalized plan for preventive services. Please take the time to review along with your updated medication list.  Consider one last mammogram in 2 years Done with colons Discussed exercise Flu vaccine in the fall

## 2020-07-17 NOTE — Assessment & Plan Note (Signed)
Mild on imaging Off the statin now---due to memory issues

## 2020-07-17 NOTE — Assessment & Plan Note (Signed)
See social history 

## 2020-07-17 NOTE — Assessment & Plan Note (Signed)
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

## 2020-09-19 ENCOUNTER — Other Ambulatory Visit: Payer: Self-pay

## 2020-09-19 ENCOUNTER — Ambulatory Visit (INDEPENDENT_AMBULATORY_CARE_PROVIDER_SITE_OTHER): Payer: Medicare HMO

## 2020-09-19 DIAGNOSIS — Z23 Encounter for immunization: Secondary | ICD-10-CM | POA: Diagnosis not present

## 2020-10-02 ENCOUNTER — Ambulatory Visit: Payer: Medicare HMO | Admitting: Dermatology

## 2020-10-02 ENCOUNTER — Other Ambulatory Visit: Payer: Self-pay

## 2020-10-02 DIAGNOSIS — L900 Lichen sclerosus et atrophicus: Secondary | ICD-10-CM

## 2020-10-02 NOTE — Progress Notes (Signed)
   Follow-Up Visit   Subjective  [name] is a 79 y.o. female who presents for the following: Follow-up (Patient here today for 6 month LS et A follow up. ).  She is using TMC 0.1% ointment three times weekly and feels like she is well controlled. No burning, no pain. Occasional irritation that is relieved with TMC but nothing constant.   The following portions of the chart were reviewed this encounter and updated as appropriate:      Review of Systems:  No other skin or systemic complaints except as noted in HPI or Assessment and Plan.  Objective  Well appearing patient in no apparent distress; mood and affect are within normal limits.  A focused examination was performed including groin. Relevant physical exam findings are noted in the Assessment and Plan.  Objective  Vaginal: Mild erythema inferior introitus and labia minora, no erosions or purpura   Assessment & Plan  Lichen sclerosus et atrophicus Vaginal  Chronic- controlled  Cont TMC 0.1% ointment three times weekly increasing to daily with flares as needed.    Return in about 6 months (around 04/02/2021) for LS et A follow up.  Graciella Belton, RMA, am acting as scribe for Brendolyn Patty, MD . Documentation: I have reviewed the above documentation for accuracy and completeness, and I agree with the above.  Brendolyn Patty MD

## 2020-10-22 ENCOUNTER — Ambulatory Visit (INDEPENDENT_AMBULATORY_CARE_PROVIDER_SITE_OTHER): Payer: Medicare HMO | Admitting: Internal Medicine

## 2020-10-22 ENCOUNTER — Encounter: Payer: Self-pay | Admitting: Internal Medicine

## 2020-10-22 ENCOUNTER — Other Ambulatory Visit: Payer: Self-pay

## 2020-10-22 ENCOUNTER — Telehealth: Payer: Self-pay

## 2020-10-22 VITALS — BP 138/86 | HR 75 | Temp 97.8°F | Wt 117.0 lb

## 2020-10-22 DIAGNOSIS — L255 Unspecified contact dermatitis due to plants, except food: Secondary | ICD-10-CM | POA: Diagnosis not present

## 2020-10-22 MED ORDER — TRIAMCINOLONE ACETONIDE 0.1 % EX CREA: 1.0000 "application " | TOPICAL_CREAM | Freq: Two times a day (BID) | CUTANEOUS | 1 refills | Status: DC | PRN

## 2020-10-22 MED ORDER — PREDNISONE 20 MG PO TABS: 40.0000 mg | ORAL_TABLET | Freq: Every day | ORAL | 0 refills | Status: DC

## 2020-10-22 NOTE — Assessment & Plan Note (Signed)
Probably approaching the peak of her symptoms Will give prednisone course Topical steroid prn Continue the fexofenadine

## 2020-10-22 NOTE — Telephone Encounter (Signed)
I cannot answer that question for her as to whether he would giver her a shot in the office or to a medication by mouth. What does she mean by she is taking course of common treatment?   Spoke to pt. She is using calamine lotion, apple cider vinegar, and other home remedies. She said she could wait to be seen tomorrow if needed.

## 2020-10-22 NOTE — Telephone Encounter (Signed)
[facility] Night - Client Nonclinical Telephone Record AccessNurse Client [facility] Night - Client Client Site Section Physician Viviana Simpler- MD Contact Type Call Who Is Calling Patient / Member / Family / Caregiver Caller Name [facility] Phone Number [phone] Patient Name [name] Patient DOB 01/04/2041 Call Type Message Only Information Provided Reason for Call Request to Schedule Office Appointment Initial Comment Has poison oak and is spreading around her neck and up on to her chin. OTC is not working. Wants to make appt, pls call back, office is not answering after opening. Disp. Time Disposition Final User 10/22/2020 8:06:53 AM General Information Provided Yes Donato Heinz Call Closed By: Donato Heinz Transaction Date/Time: 10/22/2020 8:04:21 AM (ET)

## 2020-10-22 NOTE — Progress Notes (Signed)
Subjective:    Patient ID: [name], female    DOB: 1941-07-29, 79 y.o.   MRN: [id number]  HPI Here due to poison oak This visit occurred during the SARS-CoV-2 public health emergency.  Safety protocols were in place, including screening questions prior to the visit, additional usage of staff PPE, and extensive cleaning of exam room while observing appropriate contact time as indicated for disinfecting solutions.   Thinks she was exposed 5 days ago--when working in her beds First noticed patch of rash on neck the next day Then blisters in arms and spreading---face, chin, etc  Very itchy Some pain----by where chin is  Tried calamine, epsom salt compress, apple cider vinegar----may soothe sllightly Using fexofenadine daily as well  Current Outpatient Medications on File Prior to Visit  Medication Sig Dispense Refill  . acetaminophen (TYLENOL) 500 MG tablet Take 1,000 mg by mouth every 6 (six) hours as needed for moderate pain or headache.    . carbamide peroxide (DEBROX) 6.5 % OTIC solution Place 5 drops into the right ear daily as needed (for wax build up).    . dimenhyDRINATE (DRAMAMINE) 50 MG tablet Take 50 mg by mouth at bedtime as needed (for sleep).    Marland Kitchen loperamide (IMODIUM) 2 MG capsule Take 1-2 capsules (2-4 mg total) by mouth every 8 (eight) hours as needed for diarrhea or loose stools (Use if >2 BM every 8 hours). 30 capsule 0  . methocarbamol (ROBAXIN) 500 MG tablet Take 1 tablet (500 mg total) by mouth every 8 (eight) hours as needed for muscle spasms. 30 tablet 1  . Multiple Vitamin (MULTIVITAMIN) tablet Take 1 tablet by mouth daily.    . polycarbophil (FIBERCON) 625 MG tablet Take 1 tablet (625 mg total) by mouth 2 (two) times daily as needed.    . Potassium Gluconate 550 MG TABS Take 550 mg by mouth 3 (three) times a week.    . Probiotic Product (PROBIOTIC PO) Take 1 capsule by mouth daily.    . vitamin B-12 (CYANOCOBALAMIN) 1000 MCG tablet Take 1,000 mcg by mouth  daily.     No current facility-administered medications on file prior to visit.    Allergies  Allergen Reactions  . Clonazepam Swelling and Other (See Comments)    REACTION: Swollen lips, sore mouth  . Penicillins Other (See Comments)    Unrecalled reaction Has patient had a PCN reaction causing immediate rash, facial/tongue/throat swelling, SOB or lightheadedness with hypotension: Unknown Has patient had a PCN reaction causing severe rash involving mucus membranes or skin necrosis: Unknown Has patient had a PCN reaction that required hospitalization: Unknown Has patient had a PCN reaction occurring within the last 10 years: No If all of the above answers are "NO", then may proceed with Cephalosporin use.     Past Medical History:  Diagnosis Date  . Anxiety   . Aortic atherosclerosis ([place])   . GERD (gastroesophageal reflux disease)    Dr. [name]  . H/O abnormal weight loss    lost over 20 lbs in the last year  . 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 History of Bell's palsy 02/2011  . HLD (hyperlipidemia)    Dr. Lia Foyer  . HOH (hard of hearing)    uses hearing aid on left side/complete loss of hearing right side  . IBS (irritable bowel syndrome)   . LBBB (left bundle branch block)   . Migraine   . OA (osteoarthritis)   . Osteopenia 2005  .  Ulcer 2003   bleeding stomach ulcer    Past Surgical History:  Procedure Laterality Date  . ABDOMINAL HYSTERECTOMY  1983  . APPENDECTOMY  1983  . BOWEL RESECTION N/A 01/20/2019   Procedure: DIAGNOSTIC LAP WITH SMALL BOWEL RESECTION;  Surgeon: Kieth Brightly, Arta Bruce, MD;  Location: [facility];  Service: General;  Laterality: N/A;  . [place]   okay, mild changes  . CHOLECYSTECTOMY  1992  . COLONOSCOPY  2003  . DEXA N/A 2005   borderline osteopenia  . DIAGNOSTIC LAPAROSCOPY  11/25/2018  . LAPAROSCOPY N/A 11/25/2018   Procedure: LAPAROSCOPY DIAGNOSTIC;  Surgeon: Kieth Brightly Arta Bruce, MD;  Location: [facility];   Service: General;  Laterality: N/A;  . TONSILLECTOMY AND ADENOIDECTOMY  1953  . UPPER GASTROINTESTINAL ENDOSCOPY  2003    Family History  Problem Relation Age of Onset  . Heart attack Father 54  . Diabetes Father   . Coronary artery disease Father   . COPD Mother   . Hypertension Mother   . Hypertension Sister   . Pulmonary fibrosis Sister   . Cancer Daughter        breast  . Rheum arthritis Daughter   . Colon cancer Neg Hx   . Esophageal cancer Neg Hx   . Rectal cancer Neg Hx     Social History   Socioeconomic History  . Marital status: Married    Spouse name: Jori Moll  . Number of children: 2  . Years of education: Not on file  . Highest education level: Not on file  Occupational History  . Occupation: Retired Medical sales representative and retired from [employer]  . Smoking status: Never Smoker  . Smokeless tobacco: Never Used  Vaping Use  . Vaping Use: Never used  Substance and Sexual Activity  . Alcohol use: No  . Drug use: No  . Sexual activity: Not on file  Other Topics Concern  . Not on file  Social History Narrative   Has living will   Husband, then daughters, are [place] health care POA   Would accept resuscitation attempts but no prolonged artifical ventilation   No prolonged tube feeds if cognitively impaired   Social Determinants of Health   Financial Resource Strain:   . Difficulty of Paying Living Expenses: Not on file  Food Insecurity:   . Worried About Charity fundraiser in the Last Year: Not on file  . Ran Out of Food in the Last Year: Not on file  Transportation Needs:   . Lack of Transportation (Medical): Not on file  . Lack of Transportation (Non-Medical): Not on file  Physical Activity:   . Days of Exercise per Week: Not on file  . Minutes of Exercise per Session: Not on file  Stress:   . Feeling of Stress : Not on file  Social Connections:   . Frequency of Communication with Friends and Family: Not on file  . Frequency of Social Gatherings  with Friends and Family: Not on file  . Attends Religious Services: Not on file  . Active Member of Clubs or Organizations: Not on file  . Attends Archivist Meetings: Not on file  . Marital Status: Not on file  Intimate Partner Violence:   . Fear of Current or Ex-Partner: Not on file  . Emotionally Abused: Not on file  . Physically Abused: Not on file  . Sexually Abused: Not on file   Review of Systems  No fever No throat swelling  Objective:   Physical Exam Constitutional:      Appearance: Normal appearance.  Skin:    Comments: Slightly raised red confluent rash on left neck Scattered lesions on chin and both forearms---slightly vesicular on arms  Neurological:     Mental Status: She is alert.            Assessment & Plan:

## 2020-10-22 NOTE — Telephone Encounter (Signed)
Okay to add her at 4:15PM

## 2020-10-22 NOTE — Telephone Encounter (Signed)
Called patient to schedule appointment. Pt stated main reason for call was to see if able to get shot to assist with poison oak. Pt is taking course of common treatment and wanted to see if she can have shot or continue with care. Declined appointment at time. Waiting for call if able to receive injection.

## 2020-10-22 NOTE — Telephone Encounter (Signed)
Spoke to pt. Made appt at 415 today.

## 2021-01-14 MED ORDER — PROPRANOLOL HCL ER 80 MG PO CP24: 80.0000 mg | ORAL_CAPSULE | Freq: Every day | ORAL | 11 refills | Status: DC

## 2021-01-31 DIAGNOSIS — G43909 Migraine, unspecified, not intractable, without status migrainosus: Secondary | ICD-10-CM

## 2021-02-14 DIAGNOSIS — G43909 Migraine, unspecified, not intractable, without status migrainosus: Secondary | ICD-10-CM | POA: Diagnosis not present

## 2021-02-14 DIAGNOSIS — M1990 Unspecified osteoarthritis, unspecified site: Secondary | ICD-10-CM | POA: Diagnosis not present

## 2021-02-14 DIAGNOSIS — R030 Elevated blood-pressure reading, without diagnosis of hypertension: Secondary | ICD-10-CM | POA: Diagnosis not present

## 2021-02-14 DIAGNOSIS — Z008 Encounter for other general examination: Secondary | ICD-10-CM | POA: Diagnosis not present

## 2021-02-14 DIAGNOSIS — I739 Peripheral vascular disease, unspecified: Secondary | ICD-10-CM | POA: Diagnosis not present

## 2021-02-14 DIAGNOSIS — Z833 Family history of diabetes mellitus: Secondary | ICD-10-CM | POA: Diagnosis not present

## 2021-02-14 DIAGNOSIS — K219 Gastro-esophageal reflux disease without esophagitis: Secondary | ICD-10-CM | POA: Diagnosis not present

## 2021-03-05 DIAGNOSIS — G43719 Chronic migraine without aura, intractable, without status migrainosus: Secondary | ICD-10-CM | POA: Diagnosis not present

## 2021-03-05 DIAGNOSIS — Z049 Encounter for examination and observation for unspecified reason: Secondary | ICD-10-CM | POA: Diagnosis not present

## 2021-03-06 ENCOUNTER — Other Ambulatory Visit: Payer: Self-pay | Admitting: Specialist

## 2021-03-06 DIAGNOSIS — R413 Other amnesia: Secondary | ICD-10-CM

## 2021-03-06 DIAGNOSIS — G4452 New daily persistent headache (NDPH): Secondary | ICD-10-CM

## 2021-03-06 DIAGNOSIS — G43711 Chronic migraine without aura, intractable, with status migrainosus: Secondary | ICD-10-CM

## 2021-03-12 ENCOUNTER — Ambulatory Visit
Admission: RE | Admit: 2021-03-12 | Discharge: 2021-03-12 | Disposition: A | Discharge status: Home / Self Care | Payer: Medicare HMO | Source: Ambulatory Visit | Attending: Specialist | Admitting: Specialist

## 2021-03-12 ENCOUNTER — Other Ambulatory Visit: Payer: Self-pay

## 2021-03-12 DIAGNOSIS — H9190 Unspecified hearing loss, unspecified ear: Secondary | ICD-10-CM | POA: Diagnosis not present

## 2021-03-12 DIAGNOSIS — R251 Tremor, unspecified: Secondary | ICD-10-CM | POA: Diagnosis not present

## 2021-03-12 DIAGNOSIS — G43711 Chronic migraine without aura, intractable, with status migrainosus: Secondary | ICD-10-CM

## 2021-03-12 DIAGNOSIS — G4452 New daily persistent headache (NDPH): Secondary | ICD-10-CM

## 2021-03-12 DIAGNOSIS — R531 Weakness: Secondary | ICD-10-CM | POA: Diagnosis not present

## 2021-03-12 DIAGNOSIS — R200 Anesthesia of skin: Secondary | ICD-10-CM | POA: Diagnosis not present

## 2021-03-12 DIAGNOSIS — R519 Headache, unspecified: Secondary | ICD-10-CM | POA: Diagnosis not present

## 2021-03-12 DIAGNOSIS — R410 Disorientation, unspecified: Secondary | ICD-10-CM | POA: Diagnosis not present

## 2021-03-12 DIAGNOSIS — R413 Other amnesia: Secondary | ICD-10-CM

## 2021-03-12 IMAGING — MR MR HEAD WO/W CM
13 series · 48 of 48 positions shown · IV contrast (multihance)
Comparison: Head CT without contrast [DATE] and earlier.

CLINICAL DATA: 79-year-old female with intermittent major headaches
for multiple years on the right side of the face, above the eye and
radiating to the occiput. Tremor, confusion, hearing loss, right
side numbness and weakness.

EXAM:
MRI HEAD WITHOUT AND WITH CONTRAST
TECHNIQUE: Multiplanar, multiecho pulse sequences of the brain and surrounding
structures were obtained without and with intravenous contrast.
CONTRAST:  11mL MULTIHANCE GADOBENATE DIMEGLUMINE 529 MG/ML IV SOLN

[Series 5: T1 · sagittal · 4.0mm · 0.75mm/px · 2 of 31 slices shown (1 of 3)]
[im 1/31]
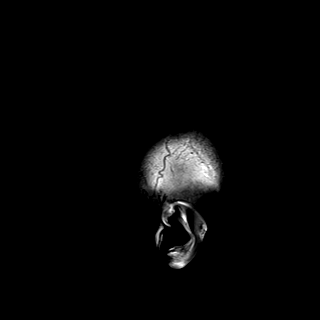
[im 31/31]
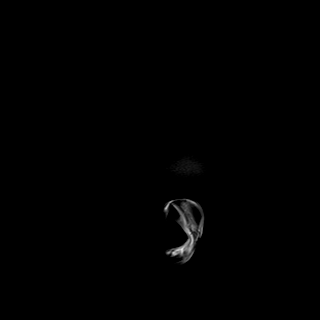

[Series 6: DWI · axial · 3.0mm · 0.94mm/px · z∈[-55,+87]mm · 8 of 168 slices shown (1 of 3)]
[im 1/168]
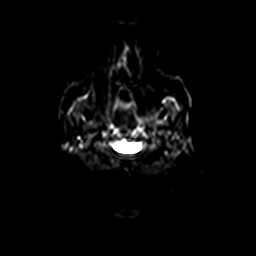
[im 24/168]
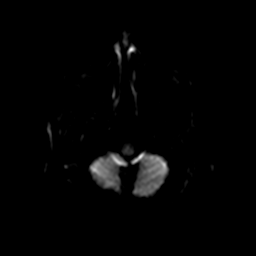
[im 48/168]
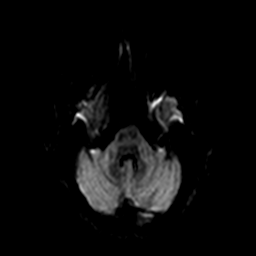
[im 72/168]
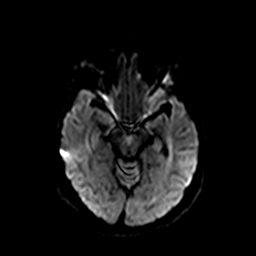
[im 96/168]
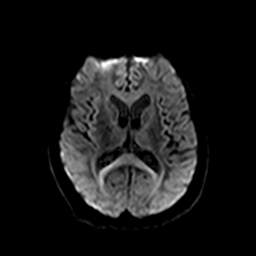
[im 120/168]
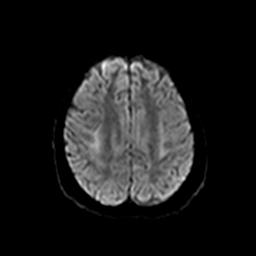
[im 144/168]
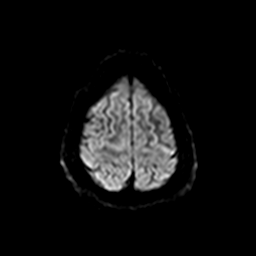
[im 168/168]
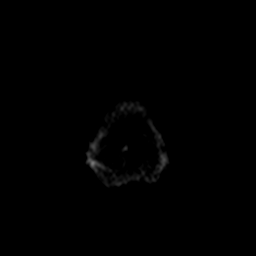

[Series 7: ax dwi_tracew · axial · 3.0mm · 0.94mm/px · z∈[-55,+87]mm · 4 of 84 slices shown]
[im 1/84]
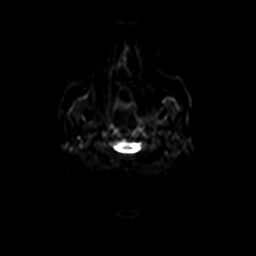
[im 28/84]
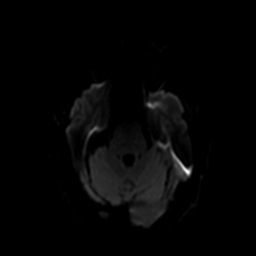
[im 56/84]
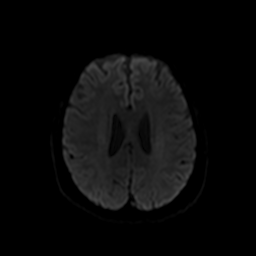
[im 84/84]
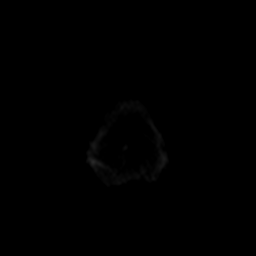

[Series 8: ax dwi_adc · axial · 3.0mm · 0.94mm/px · z∈[-55,+87]mm · 2 of 42 slices shown]
[im 1/42]
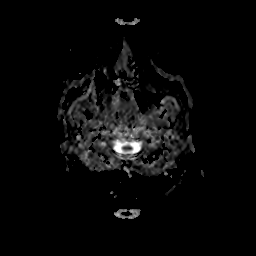
[im 42/42]
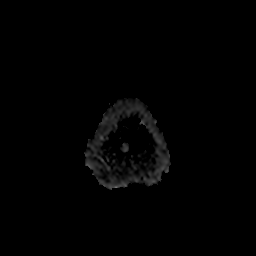

[Series 9: DWI · coronal · 5.0mm · 1.44mm/px · 3 of 64 slices shown (2 of 3)]
[im 1/64]
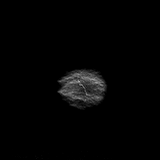
[im 32/64]
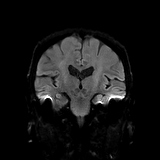
[im 64/64]
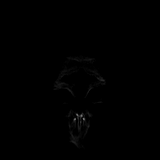

[Series 10: DWI · coronal · 5.0mm · 1.44mm/px · 2 of 32 slices shown (3 of 3)]
[im 1/32]
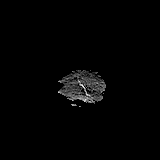
[im 32/32]
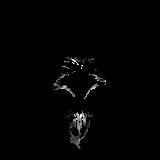

[Series 11: T2 · axial · 4.0mm · 0.36mm/px · 1 of 29 slices shown]
[im 1/29]
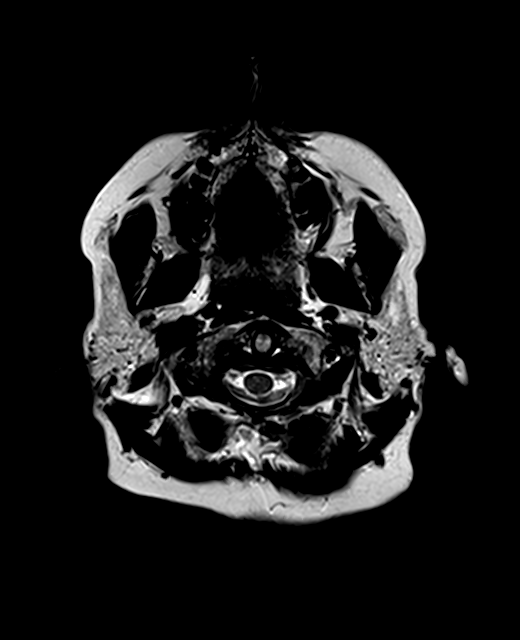

[Series 12: FLAIR · axial · 3.0mm · 0.72mm/px · 1 of 26 slices shown]
[im 1/26]
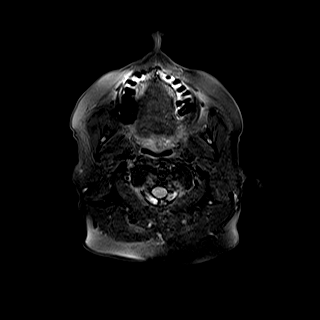

[Series 14: swi_images · axial · 1.5mm · 0.90mm/px · z∈[-52,+86]mm · 5 of 96 slices shown]
[im 1/96]
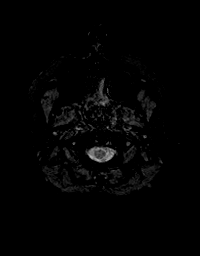
[im 24/96]
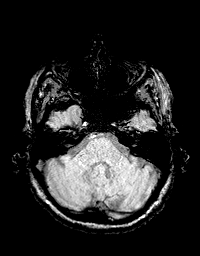
[im 48/96]
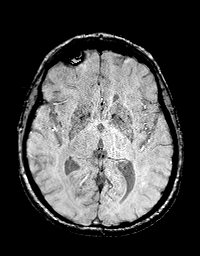
[im 72/96]
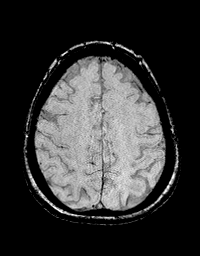
[im 96/96]
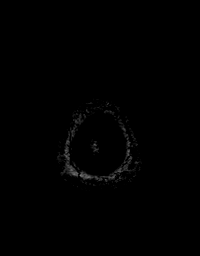

[Series 15: T1 · axial · 1.0mm · 0.94mm/px · z∈[-59,+95]mm · 8 of 160 slices shown (2 of 3)]
[im 1/160]
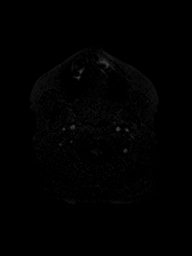
[im 23/160]
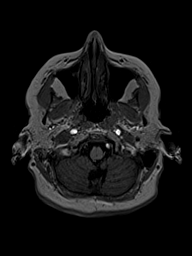
[im 46/160]
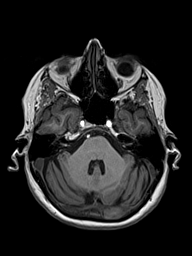
[im 69/160]
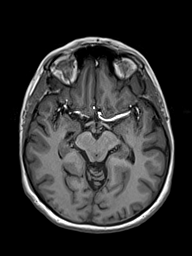
[im 91/160]
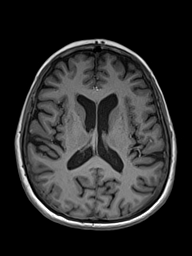
[im 114/160]
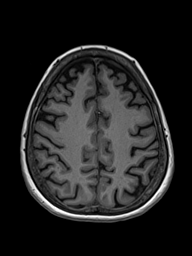
[im 137/160]
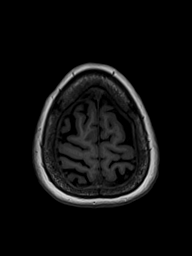
[im 160/160]
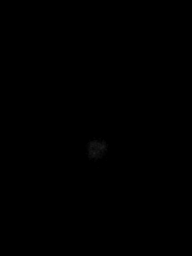

[Series 16: T2 post-contrast · coronal · 4.0mm · 0.36mm/px · 2 of 34 slices shown]
[im 1/34]
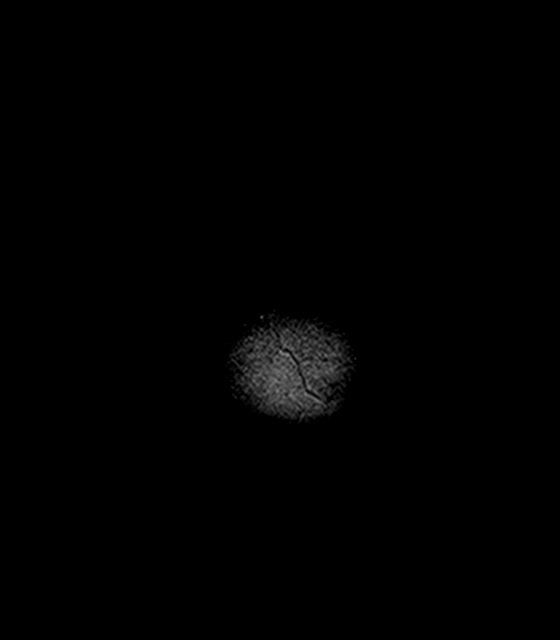
[im 34/34]
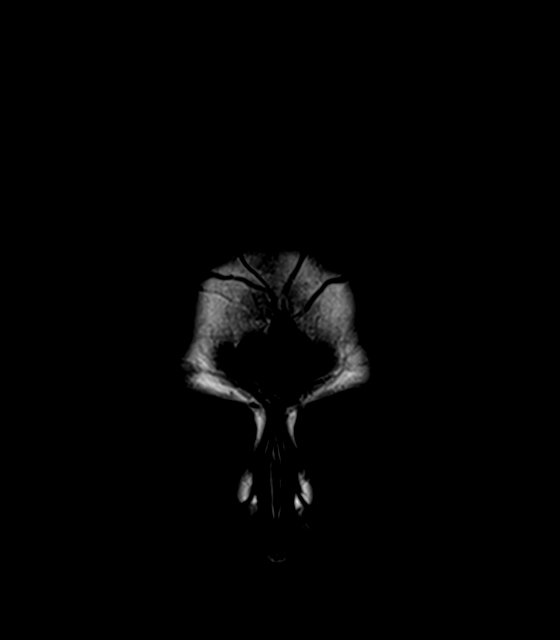

[Series 17: T1 · axial · 1.0mm · 0.94mm/px · z∈[-59,+95]mm · 8 of 160 slices shown (3 of 3)]
[im 1/160]
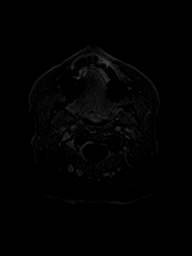
[im 23/160]
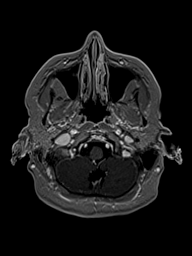
[im 46/160]
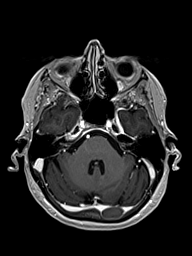
[im 69/160]
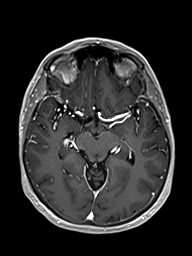
[im 91/160]
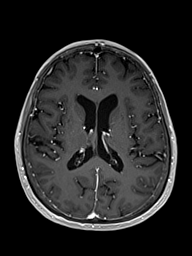
[im 114/160]
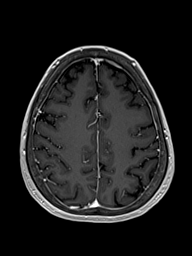
[im 137/160]
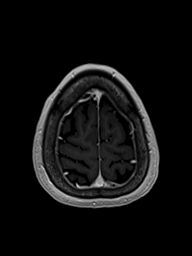
[im 160/160]
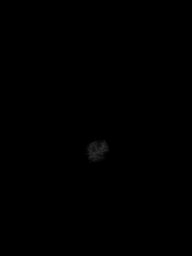

[Series 18: T1 post-contrast · coronal · 4.0mm · 0.72mm/px · 2 of 34 slices shown]
[im 1/34]
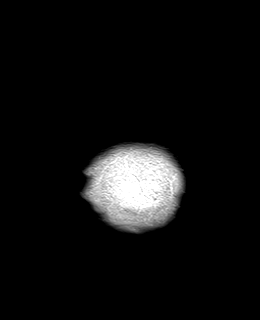
[im 34/34]
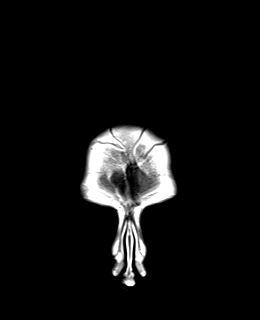

[48 of 48 positions shown; findings below may reference images not displayed]

FINDINGS: Brain: Cerebral volume is within normal limits for age. No
restricted diffusion to suggest acute infarction. No midline shift,
mass effect, evidence of mass lesion, ventriculomegaly, extra-axial
collection or acute intracranial hemorrhage. Cervicomedullary
junction and pituitary are within normal limits.

Gray and white matter signal is within normal limits for age
throughout the brain. No cortical encephalomalacia. No chronic
cerebral blood products. Deep gray matter nuclei, brainstem and
cerebellum are normal for age.

No abnormal enhancement identified.  No dural thickening.

Vascular: Major intracranial vascular flow voids are preserved. The
major dural venous sinuses are enhancing and appear patent.

Skull and upper cervical spine: Normal for age visible cervical
spine, bone marrow signal.

Sinuses/Orbits: Normal orbits. Only trace paranasal sinus mucosal
thickening.

Other: Mastoids are clear. Visible internal auditory structures
appear normal. Visible scalp and face appear negative.
IMPRESSION: Normal for age MRI appearance of the brain.

## 2021-03-12 MED ORDER — GADOBENATE DIMEGLUMINE 529 MG/ML IV SOLN
11.0000 mL | Freq: Once | INTRAVENOUS | Status: AC | PRN
  Administered 2021-03-12: 11 mL via INTRAVENOUS

## 2021-03-20 DIAGNOSIS — M791 Myalgia, unspecified site: Secondary | ICD-10-CM | POA: Diagnosis not present

## 2021-03-20 DIAGNOSIS — Z79899 Other long term (current) drug therapy: Secondary | ICD-10-CM | POA: Diagnosis not present

## 2021-03-20 DIAGNOSIS — M542 Cervicalgia: Secondary | ICD-10-CM | POA: Diagnosis not present

## 2021-03-20 DIAGNOSIS — G518 Other disorders of facial nerve: Secondary | ICD-10-CM | POA: Diagnosis not present

## 2021-03-20 DIAGNOSIS — G43719 Chronic migraine without aura, intractable, without status migrainosus: Secondary | ICD-10-CM | POA: Diagnosis not present

## 2021-03-28 ENCOUNTER — Other Ambulatory Visit: Payer: Self-pay

## 2021-03-28 ENCOUNTER — Ambulatory Visit
Admission: RE | Admit: 2021-03-28 | Discharge: 2021-03-28 | Disposition: A | Discharge status: Home / Self Care | Payer: Medicare HMO | Source: Ambulatory Visit | Attending: Emergency Medicine | Admitting: Emergency Medicine

## 2021-03-28 VITALS — BP 155/76 | HR 95 | Temp 98.9°F | Resp 18 | Ht 62.0 in | Wt 117.0 lb

## 2021-03-28 DIAGNOSIS — Z1152 Encounter for screening for COVID-19: Secondary | ICD-10-CM | POA: Insufficient documentation

## 2021-03-28 DIAGNOSIS — J069 Acute upper respiratory infection, unspecified: Secondary | ICD-10-CM | POA: Diagnosis not present

## 2021-03-28 LAB — POCT RAPID STREP A (OFFICE): Rapid Strep A Screen: NEGATIVE

## 2021-03-28 NOTE — ED Triage Notes (Signed)
Patient in office with daughter c/o cough,congestion,hoarseness and low grade fever x 5d  UGA:[id number] and  Mucinex  Denies: none

## 2021-03-28 NOTE — Discharge Instructions (Signed)
Your rapid strep test is negative.  A throat culture is pending; we will call you if it is positive requiring treatment.    Your COVID and Influenza tests are pending.  You should self quarantine until the test results are back.    Follow-up with your primary care provider if your symptoms are not improving.

## 2021-03-28 NOTE — ED Provider Notes (Signed)
[name]    CSN: [id number] Arrival date & time: 03/28/21  1045      History   Chief Complaint Chief Complaint  Patient presents with  . Cough  . Sore Throat  . Nasal Congestion    HPI [name] is a 80 y.o. female.   Patient presents with sore throat, laryngitis, earache, headache, low-grade fever, cough, nausea for 6 days.  Tmax 100.8.  Her symptoms started 2 days after receiving the COVID booster.  Treatment attempted at home with Mucinex and Robitussin.  She denies rash, shortness of breath, vomiting, diarrhea, or other symptoms.  Her medical history includes left bundle branch block, atherosclerosis, GERD, IBS, Crohn's disease, osteoarthritis, osteopenia, migraine headaches, memory loss, anxiety, hard of hearing.  The history is provided by the patient and medical records.    Past Medical History:  Diagnosis Date  . Anxiety   . Aortic atherosclerosis ([place])   . GERD (gastroesophageal reflux disease)    Dr. [name]  . H/O abnormal weight loss    lost over 20 lbs in the last year  . 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 History of Bell's palsy 02/2011  . HLD (hyperlipidemia)    Dr. Lia Foyer  . HOH (hard of hearing)    uses hearing aid on left side/complete loss of hearing right side  . IBS (irritable bowel syndrome)   . LBBB (left bundle branch block)   . Migraine   . OA (osteoarthritis)   . Osteopenia 2005  . Ulcer 2003   bleeding stomach ulcer    Patient Active Problem List   Diagnosis Date Noted  . Contact dermatitis due to plant 10/22/2020  . Memory loss 07/13/2019  . Crohn's disease ([place]) 03/31/2019  . Tremor 03/31/2019  . Aortic atherosclerosis ([employer])   . Advance directive discussed with patient 06/05/2016  . Vitamin B12 deficiency 10/15/2012  . Migraine syndrome 04/16/2012  . Routine general medical examination at a health care facility 04/16/2011  . OSTEOARTHRITIS 07/20/2007  . Hyperlipemia 07/08/2007  . Mood disorder ([place])  07/08/2007  . GERD 07/08/2007  . IBS 07/08/2007  . OSTEOPENIA 07/08/2007    Past Surgical History:  Procedure Laterality Date  . ABDOMINAL HYSTERECTOMY  1983  . APPENDECTOMY  1983  . BOWEL RESECTION N/A 01/20/2019   Procedure: DIAGNOSTIC LAP WITH SMALL BOWEL RESECTION;  Surgeon: Kieth Brightly, Arta Bruce, MD;  Location: [facility];  Service: General;  Laterality: N/A;  . [place]   okay, mild changes  . CHOLECYSTECTOMY  1992  . COLONOSCOPY  2003  . DEXA N/A 2005   borderline osteopenia  . DIAGNOSTIC LAPAROSCOPY  11/25/2018  . LAPAROSCOPY N/A 11/25/2018   Procedure: LAPAROSCOPY DIAGNOSTIC;  Surgeon: Kieth Brightly Arta Bruce, MD;  Location: [facility];  Service: General;  Laterality: N/A;  . TONSILLECTOMY AND ADENOIDECTOMY  1953  . UPPER GASTROINTESTINAL ENDOSCOPY  2003    OB History   No obstetric history on file.      Home Medications    Prior to Admission medications   Medication Sig Start Date End Date Taking? Authorizing Provider  baclofen (LIORESAL) 10 MG tablet Take 10 mg by mouth 3 (three) times daily.   Yes [provider]  zonisamide (ZONEGRAN) 100 MG capsule Take 100 mg by mouth daily.   Yes [provider]  acetaminophen (TYLENOL) 500 MG tablet Take 1,000 mg by mouth every 6 (six) hours as needed for moderate pain or headache.  [provider]  carbamide peroxide (DEBROX) 6.5 % OTIC solution Place 5 drops into the right ear daily as needed (for wax build up).    [provider]  dimenhyDRINATE (DRAMAMINE) 50 MG tablet Take 50 mg by mouth at bedtime as needed (for sleep).    [provider]  loperamide (IMODIUM) 2 MG capsule Take 1-2 capsules (2-4 mg total) by mouth every 8 (eight) hours as needed for diarrhea or loose stools (Use if >2 BM every 8 hours). 01/24/19   Kinsinger, Arta Bruce, MD  methocarbamol (ROBAXIN) 500 MG tablet Take 1 tablet (500 mg total) by mouth every 8 (eight) hours as needed for muscle spasms.  02/06/20   Tonia Ghent, MD  Multiple Vitamin (MULTIVITAMIN) tablet Take 1 tablet by mouth daily.    [provider]  polycarbophil (FIBERCON) 625 MG tablet Take 1 tablet (625 mg total) by mouth 2 (two) times daily as needed. 02/06/20   Tonia Ghent, MD  Potassium Gluconate 550 MG TABS Take 550 mg by mouth 3 (three) times a week.    [provider]  predniSONE (DELTASONE) 20 MG tablet Take 2 tablets (40 mg total) by mouth daily. For 4 days, then 1 tab daily for 4 days 10/22/20   Venia Carbon, MD  Probiotic Product (PROBIOTIC PO) Take 1 capsule by mouth daily.    [provider]  propranolol ER (INDERAL LA) 80 MG 24 hr capsule Take 1 capsule (80 mg total) by mouth daily. 01/14/21   Venia Carbon, MD  triamcinolone (KENALOG) 0.1 % Apply 1 application topically 2 (two) times daily as needed. 10/22/20   Venia Carbon, MD  vitamin B-12 (CYANOCOBALAMIN) 1000 MCG tablet Take 1,000 mcg by mouth daily.    [provider]    Family History Family History  Problem Relation Age of Onset  . Heart attack Father 66  . Diabetes Father   . Coronary artery disease Father   . COPD Mother   . Hypertension Mother   . Hypertension Sister   . Pulmonary fibrosis Sister   . Cancer Daughter        breast  . Rheum arthritis Daughter   . Colon cancer Neg Hx   . Esophageal cancer Neg Hx   . Rectal cancer Neg Hx     Social History Social History   Tobacco Use  . Smoking status: Never Smoker  . Smokeless tobacco: Never Used  Vaping Use  . Vaping Use: Never used  Substance Use Topics  . Alcohol use: No  . Drug use: No     Allergies   Clonazepam and Penicillins   Review of Systems Review of Systems  Constitutional: Positive for fever. Negative for chills.  HENT: Positive for ear pain, sore throat and voice change.   Eyes: Negative for pain and visual disturbance.  Respiratory: Positive for cough. Negative for shortness of breath.    Cardiovascular: Negative for chest pain and palpitations.  Gastrointestinal: Positive for nausea. Negative for abdominal pain, diarrhea and vomiting.  Genitourinary: Negative for dysuria and hematuria.  Musculoskeletal: Negative for arthralgias and back pain.  Skin: Negative for color change and rash.  Neurological: Positive for headaches. Negative for seizures and syncope.  All other systems reviewed and are negative.    Physical Exam Triage Vital Signs ED Triage Vitals  Enc Vitals Group     BP      Pulse      Resp      Temp  Temp src      SpO2      Weight      Height      Head Circumference      Peak Flow      Pain Score      Pain Loc      Pain Edu?      Excl. in [place]?    No data found.  Updated Vital Signs BP (!) 155/76 (BP Location: Left Arm)   Pulse 95   Temp 98.9 F (37.2 C) (Oral)   Resp 18   Ht 5' 2"  (1.575 m)   Wt 117 lb (53.1 kg)   SpO2 98%   BMI 21.40 kg/m   Visual Acuity Right Eye Distance:   Left Eye Distance:   Bilateral Distance:    Right Eye Near:   Left Eye Near:    Bilateral Near:     Physical Exam Vitals and nursing note reviewed.  Constitutional:      General: She is not in acute distress.    Appearance: She is well-developed. She is not ill-appearing.  HENT:     Head: Normocephalic and atraumatic.     Right Ear: Tympanic membrane normal.     Left Ear: Tympanic membrane normal.     Nose: Nose normal.     Mouth/Throat:     Mouth: Mucous membranes are moist.     Pharynx: Oropharynx is clear.     Comments: Clear postnasal drip. Eyes:     Conjunctiva/sclera: Conjunctivae normal.  Cardiovascular:     Rate and Rhythm: Normal rate and regular rhythm.     Heart sounds: Normal heart sounds.  Pulmonary:     Effort: Pulmonary effort is normal. No respiratory distress.     Breath sounds: Normal breath sounds.  Abdominal:     General: Bowel sounds are normal.     Palpations: Abdomen is soft.     Tenderness: There is no abdominal  tenderness. There is no guarding or rebound.  Musculoskeletal:     Cervical back: Neck supple.  Skin:    General: Skin is warm and dry.     Findings: No rash.  Neurological:     Mental Status: She is alert and oriented to person, place, and time.     Gait: Gait normal.  Psychiatric:        Mood and Affect: Mood normal.        Behavior: Behavior normal.      UC Treatments / Results  Labs (all labs ordered are listed, but only abnormal results are displayed) Labs Reviewed  COVID-19, FLU A+B NAA  CULTURE, GROUP A STREP [facility])  POCT RAPID STREP A (OFFICE)    EKG   Radiology No results found.  Procedures Procedures (including critical care time)  Medications Ordered in UC Medications - No data to display  Initial Impression / Assessment and Plan / UC Course  I have reviewed the triage vital signs and the nursing notes.  Pertinent labs & imaging results that were available during my care of the patient were reviewed by me and considered in my medical decision making (see chart for details).   Viral URI with cough.  Patient is well-appearing and her exam is reassuring.  Rapid strep negative; culture pending.  Influenza and COVID pending.  Instructed patient to self quarantine until the test results are back.  Instructed her to continue symptomatic treatment.  Discussed that she should follow up with PCP if her symptoms are not improving.  Patient agrees to plan of care.    Final Clinical Impressions(s) / UC Diagnoses   Final diagnoses:  Encounter for screening for COVID-19  Viral URI with cough     Discharge Instructions     Your rapid strep test is negative.  A throat culture is pending; we will call you if it is positive requiring treatment.    Your COVID and Influenza tests are pending.  You should self quarantine until the test results are back.    Follow-up with your primary care provider if your symptoms are not improving.          ED Prescriptions     None     PDMP not reviewed this encounter.   Sharion Balloon, NP 03/28/21 1141

## 2021-03-29 LAB — COVID-19, FLU A+B NAA
Influenza A, NAA: NOT DETECTED
Influenza B, NAA: NOT DETECTED
SARS-CoV-2, NAA: NOT DETECTED

## 2021-03-31 LAB — CULTURE, GROUP A STREP (THRC)

## 2021-04-02 ENCOUNTER — Ambulatory Visit: Payer: Medicare HMO | Admitting: Dermatology

## 2021-04-03 DIAGNOSIS — M791 Myalgia, unspecified site: Secondary | ICD-10-CM | POA: Diagnosis not present

## 2021-04-03 DIAGNOSIS — G43719 Chronic migraine without aura, intractable, without status migrainosus: Secondary | ICD-10-CM | POA: Diagnosis not present

## 2021-04-03 DIAGNOSIS — M542 Cervicalgia: Secondary | ICD-10-CM | POA: Diagnosis not present

## 2021-04-03 DIAGNOSIS — G518 Other disorders of facial nerve: Secondary | ICD-10-CM | POA: Diagnosis not present

## 2021-04-15 DIAGNOSIS — H2513 Age-related nuclear cataract, bilateral: Secondary | ICD-10-CM | POA: Diagnosis not present

## 2021-04-16 DIAGNOSIS — G43719 Chronic migraine without aura, intractable, without status migrainosus: Secondary | ICD-10-CM | POA: Diagnosis not present

## 2021-04-16 DIAGNOSIS — M542 Cervicalgia: Secondary | ICD-10-CM | POA: Diagnosis not present

## 2021-04-16 DIAGNOSIS — G518 Other disorders of facial nerve: Secondary | ICD-10-CM | POA: Diagnosis not present

## 2021-04-16 DIAGNOSIS — M791 Myalgia, unspecified site: Secondary | ICD-10-CM | POA: Diagnosis not present

## 2021-04-29 ENCOUNTER — Other Ambulatory Visit: Payer: Self-pay

## 2021-04-29 ENCOUNTER — Ambulatory Visit: Payer: Medicare HMO | Admitting: Dermatology

## 2021-04-29 DIAGNOSIS — L820 Inflamed seborrheic keratosis: Secondary | ICD-10-CM

## 2021-04-29 DIAGNOSIS — L900 Lichen sclerosus et atrophicus: Secondary | ICD-10-CM | POA: Diagnosis not present

## 2021-04-29 DIAGNOSIS — L821 Other seborrheic keratosis: Secondary | ICD-10-CM | POA: Diagnosis not present

## 2021-04-29 MED ORDER — TRIAMCINOLONE ACETONIDE 0.1 % EX CREA: TOPICAL_CREAM | CUTANEOUS | 1 refills | Status: DC

## 2021-04-29 NOTE — Progress Notes (Signed)
   Follow-Up Visit   Subjective  [name] is a 80 y.o. female who presents for the following: Follow-up (Patient here for 6 month follow-up Lichen Sclerosis of the vaginal area. She is controlled using TMC 0.1% Ointment 3x/week.) and Spot (Left zygoma. Hit recently with brush and area bled a little.). TMC controls itch, burn vaginal area.    The following portions of the chart were reviewed this encounter and updated as appropriate:       Review of Systems:  No other skin or systemic complaints except as noted in HPI or Assessment and Plan.  Objective  Well appearing patient in no apparent distress; mood and affect are within normal limits.  A focused examination was performed including face, vaginal area. Relevant physical exam findings are noted in the Assessment and Plan.  Objective  Vaginal Area: Erythema inferior introitus and labia minora. No erosions or purpura  Objective  Right Zygoma x 1: Erythematous keratotic or waxy stuck-on papule    Assessment & Plan  Lichen sclerosus et atrophicus Vaginal Area  Chronic - Controlled  Cont TMC 0.1% ointment three times weekly increasing to daily with flares as needed.   Lichen sclerosus is a chronic inflammatory condition that is NOT sexually transmitted. It requires regular monitoring and treatment to minimize inflammation to reduce risk of scarring. There is also a risk of cancer in the area which is very low if inflammation is well controlled. Regular checks of the area are recommended. Please call if you notice any new or changing spots within this area.   Inflamed seborrheic keratosis Right Zygoma x 1  Prior to procedure, discussed risks of blister formation, small wound, skin dyspigmentation, or rare scar following cryotherapy.    Destruction of lesion - Right Zygoma x 1  Destruction method: cryotherapy   Informed consent: discussed and consent obtained   Lesion destroyed using liquid nitrogen: Yes   Region  frozen until ice ball extended beyond lesion: Yes   Outcome: patient tolerated procedure well with no complications   Post-procedure details: wound care instructions given    Seborrheic Keratoses - Stuck-on, waxy, tan-brown papules and/or plaques  - Benign-appearing - Discussed benign etiology and prognosis. - Observe - Call for any changes   Return in about 6 months (around 10/30/2021) for LS et A.   I, Jamesetta Orleans, CMA, am acting as scribe for Brendolyn Patty, MD .  Documentation: I have reviewed the above documentation for accuracy and completeness, and I agree with the above.  Brendolyn Patty MD

## 2021-04-29 NOTE — Patient Instructions (Addendum)
Cryotherapy Aftercare  . Wash gently with soap and water everyday.   Marland Kitchen Apply Vaseline and Band-Aid daily until healed.    Topical steroids (such as triamcinolone, fluocinolone, fluocinonide, mometasone, clobetasol, halobetasol, betamethasone, hydrocortisone) can cause thinning and lightening of the skin if they are used for too long in the same area. Your physician has selected the right strength medicine for your problem and area affected on the body. Please use your medication only as directed by your physician to prevent side effects.   If you have any questions or concerns for your doctor, please call our main line at [phone] and press option 4 to reach your doctor's medical assistant. If no one answers, please leave a voicemail as directed and we will return your call as soon as possible. Messages left after 4 pm will be answered the following business day.   You may also send [place] a message via [place]. We typically respond to MyChart messages within 1-2 business days.  For prescription refills, please ask your pharmacy to contact our office. Our fax number is [phone].  If you have an urgent issue when the clinic is closed that cannot wait until the next business day, you can page your doctor at the number below.    Please note that while we do our best to be available for urgent issues outside of office hours, we are not available 24/7.   If you have an urgent issue and are unable to reach [place], you may choose to seek medical care at your doctor's office, retail clinic, urgent care center, or emergency room.  If you have a medical emergency, please immediately call 911 or go to the emergency department.  Pager Numbers  - Dr. Nehemiah Massed: [phone]  - Dr. Laurence Ferrari: [phone]  - Dr. Nicole Kindred: [phone]  In the event of inclement weather, please call our main line at [phone] for an update on the status of any delays or closures.  Dermatology Medication Tips: Please  keep the boxes that topical medications come in in order to help keep track of the instructions about where and how to use these. Pharmacies typically print the medication instructions only on the boxes and not directly on the medication tubes.   If your medication is too expensive, please contact our office at [phone] option 4 or send [place] a message through [place].   We are unable to tell what your co-pay for medications will be in advance as this is different depending on your insurance coverage. However, we may be able to find a substitute medication at lower cost or fill out paperwork to get insurance to cover a needed medication.   If a prior authorization is required to get your medication covered by your insurance company, please allow [place] 1-2 business days to complete this process.  Drug prices often vary depending on where the prescription is filled and some pharmacies may offer cheaper prices.  The website www.goodrx.com contains coupons for medications through different pharmacies. The prices here do not account for what the cost may be with help from insurance (it may be cheaper with your insurance), but the website can give you the price if you did not use any insurance.  - You can print the associated coupon and take it with your prescription to the pharmacy.  - You may also stop by our office during regular business hours and pick up a GoodRx coupon card.  - If you need your prescription sent electronically to a different pharmacy, notify our  office through [facility] or by phone at [phone] option 4.

## 2021-05-01 ENCOUNTER — Other Ambulatory Visit: Payer: Self-pay

## 2021-05-01 DIAGNOSIS — L900 Lichen sclerosus et atrophicus: Secondary | ICD-10-CM

## 2021-05-01 MED ORDER — TRIAMCINOLONE ACETONIDE 0.1 % EX OINT: TOPICAL_OINTMENT | CUTANEOUS | 1 refills | Status: DC

## 2021-05-01 NOTE — Progress Notes (Signed)
Pt needed TMC ointment instead of cream

## 2021-05-07 DIAGNOSIS — G518 Other disorders of facial nerve: Secondary | ICD-10-CM | POA: Diagnosis not present

## 2021-05-07 DIAGNOSIS — M542 Cervicalgia: Secondary | ICD-10-CM | POA: Diagnosis not present

## 2021-05-07 DIAGNOSIS — M791 Myalgia, unspecified site: Secondary | ICD-10-CM | POA: Diagnosis not present

## 2021-05-07 DIAGNOSIS — G43719 Chronic migraine without aura, intractable, without status migrainosus: Secondary | ICD-10-CM | POA: Diagnosis not present

## 2021-05-21 DIAGNOSIS — M542 Cervicalgia: Secondary | ICD-10-CM | POA: Diagnosis not present

## 2021-05-21 DIAGNOSIS — M791 Myalgia, unspecified site: Secondary | ICD-10-CM | POA: Diagnosis not present

## 2021-05-21 DIAGNOSIS — G518 Other disorders of facial nerve: Secondary | ICD-10-CM | POA: Diagnosis not present

## 2021-05-21 DIAGNOSIS — G43719 Chronic migraine without aura, intractable, without status migrainosus: Secondary | ICD-10-CM | POA: Diagnosis not present

## 2021-06-05 DIAGNOSIS — M791 Myalgia, unspecified site: Secondary | ICD-10-CM | POA: Diagnosis not present

## 2021-06-05 DIAGNOSIS — G518 Other disorders of facial nerve: Secondary | ICD-10-CM | POA: Diagnosis not present

## 2021-06-05 DIAGNOSIS — G43719 Chronic migraine without aura, intractable, without status migrainosus: Secondary | ICD-10-CM | POA: Diagnosis not present

## 2021-06-05 DIAGNOSIS — M542 Cervicalgia: Secondary | ICD-10-CM | POA: Diagnosis not present

## 2021-07-18 DIAGNOSIS — G43719 Chronic migraine without aura, intractable, without status migrainosus: Secondary | ICD-10-CM | POA: Diagnosis not present

## 2021-07-23 ENCOUNTER — Other Ambulatory Visit: Payer: Self-pay

## 2021-07-23 ENCOUNTER — Ambulatory Visit (INDEPENDENT_AMBULATORY_CARE_PROVIDER_SITE_OTHER): Payer: Medicare HMO | Admitting: Internal Medicine

## 2021-07-23 ENCOUNTER — Encounter: Payer: Self-pay | Admitting: Internal Medicine

## 2021-07-23 VITALS — BP 120/80 | HR 78 | Temp 98.1°F | Ht 62.0 in | Wt 116.0 lb

## 2021-07-23 DIAGNOSIS — E785 Hyperlipidemia, unspecified: Secondary | ICD-10-CM

## 2021-07-23 DIAGNOSIS — G3184 Mild cognitive impairment, so stated: Secondary | ICD-10-CM

## 2021-07-23 DIAGNOSIS — G43909 Migraine, unspecified, not intractable, without status migrainosus: Secondary | ICD-10-CM

## 2021-07-23 DIAGNOSIS — F39 Unspecified mood [affective] disorder: Secondary | ICD-10-CM

## 2021-07-23 DIAGNOSIS — Z7189 Other specified counseling: Secondary | ICD-10-CM

## 2021-07-23 DIAGNOSIS — E538 Deficiency of other specified B group vitamins: Secondary | ICD-10-CM

## 2021-07-23 DIAGNOSIS — K5080 Crohn's disease of both small and large intestine without complications: Secondary | ICD-10-CM | POA: Diagnosis not present

## 2021-07-23 DIAGNOSIS — I700 Atherosclerosis of aorta: Secondary | ICD-10-CM

## 2021-07-23 DIAGNOSIS — Z0000 Encounter for general adult medical examination without abnormal findings: Secondary | ICD-10-CM | POA: Diagnosis not present

## 2021-07-23 DIAGNOSIS — R69 Illness, unspecified: Secondary | ICD-10-CM | POA: Diagnosis not present

## 2021-07-23 LAB — CBC
HCT: 42.9 % (ref 36.0–46.0)
Hemoglobin: 14.4 g/dL (ref 12.0–15.0)
MCHC: 33.5 g/dL (ref 30.0–36.0)
MCV: 91.6 fl (ref 78.0–100.0)
Platelets: 203 10*3/uL (ref 150.0–400.0)
RBC: 4.68 Mil/uL (ref 3.87–5.11)
RDW: 13.1 % (ref 11.5–15.5)
WBC: 5.4 10*3/uL (ref 4.0–10.5)

## 2021-07-23 LAB — COMPREHENSIVE METABOLIC PANEL
ALT: 12 U/L (ref 0–35)
AST: 17 U/L (ref 0–37)
Albumin: 4.1 g/dL (ref 3.5–5.2)
Alkaline Phosphatase: 70 U/L (ref 39–117)
BUN: 9 mg/dL (ref 6–23)
CO2: 24 mEq/L (ref 19–32)
Calcium: 9.2 mg/dL (ref 8.4–10.5)
Chloride: 106 mEq/L (ref 96–112)
Creatinine, Ser: 0.86 mg/dL (ref 0.40–1.20)
GFR: 64.29 mL/min (ref >60.00)
Glucose, Bld: 94 mg/dL (ref 70–99)
Potassium: 4.1 mEq/L (ref 3.5–5.1)
Sodium: 139 mEq/L (ref 135–145)
Total Bilirubin: 0.5 mg/dL (ref 0.2–1.2)
Total Protein: 6.5 g/dL (ref 6.0–8.3)

## 2021-07-23 LAB — VITAMIN B12: Vitamin B-12: 433 pg/mL (ref 211–911)

## 2021-07-23 LAB — LIPID PANEL
Cholesterol: 221 mg/dL — ABNORMAL HIGH (ref 0–200)
HDL: 55.4 mg/dL (ref >39.00)
NonHDL: 166.05
Total CHOL/HDL Ratio: 4
Triglycerides: 218 mg/dL — ABNORMAL HIGH (ref 0.0–149.0)
VLDL: 43.6 mg/dL — ABNORMAL HIGH (ref 0.0–40.0)

## 2021-07-23 LAB — LDL CHOLESTEROL, DIRECT: Direct LDL: 150 mg/dL

## 2021-07-23 NOTE — Assessment & Plan Note (Signed)
Will recheck levels on daily B12

## 2021-07-23 NOTE — Assessment & Plan Note (Signed)
See social history 

## 2021-07-23 NOTE — Assessment & Plan Note (Signed)
Stable mild memory issues No action needed

## 2021-07-23 NOTE — Assessment & Plan Note (Signed)
No statin due to apparent cognitive side effects

## 2021-07-23 NOTE — Assessment & Plan Note (Signed)
Mild dysthymia persists No MDD Will hold off on meds Discussed exercise and social engagement

## 2021-07-23 NOTE — Assessment & Plan Note (Signed)
Mostly controlled with daily zonisamide Uses the baclofen rarely to abort headaches

## 2021-07-23 NOTE — Assessment & Plan Note (Signed)
On imaging No statin now due to trouble with them

## 2021-07-23 NOTE — Assessment & Plan Note (Signed)
Has been mostly quiet since surgery No Rx

## 2021-07-23 NOTE — Assessment & Plan Note (Signed)
I have personally reviewed the Medicare Annual Wellness questionnaire and have noted 1. The patient's medical and social history 2. Their use of alcohol, tobacco or illicit drugs 3. Their current medications and supplements 4. The patient's functional ability including ADL's, fall risks, home safety risks and hearing or visual             impairment. 5. Diet and physical activities 6. Evidence for depression or mood disorders  The patients weight, height, BMI and visual acuity have been recorded in the chart I have made referrals, counseling and provided education to the patient based review of the above and I have provided the pt with a written personalized care plan for preventive services.  I have provided you with a copy of your personalized plan for preventive services. Please take the time to review along with your updated medication list.  Done with colonoscopies for screening Will get one last mammogram next spring Bivalent COVID vaccine at some point Flu vaccine soon Discussed exercise---restart walking and going to gym

## 2021-07-23 NOTE — Progress Notes (Signed)
Hearing Screening - Comments:: Has a hearing aid. Wearing it today. Vision Screening - Comments:: May 2022

## 2021-07-23 NOTE — Progress Notes (Signed)
Subjective:    Patient ID: [name], female    DOB: 10-04-1941, 80 y.o.   MRN: [id number]  HPI Here for Medicare wellness visit and follow up of chronic health conditions This visit occurred during the SARS-CoV-2 public health emergency.  Safety protocols were in place, including screening questions prior to the visit, additional usage of staff PPE, and extensive cleaning of exam room while observing appropriate contact time as indicated for disinfecting solutions.   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 Some help from right hearing aide---still trouble with word recognition No falls Mild persistent mood issues Independent with instrumental ADLs  Still has some cough from viral infection back in April Seen at urgent care COVID was negative No SOB  Still has some memory issues No obvious worsening No functional changes  Continues on zonisamide Only rarely gets mild headaches on this  No abdominal pain No blood in stool No Crohn's symptoms Does tend to get constipated---occ takes fiber laxative which helps  Known atherosclerosis on imaging Was on statin in past--might have affected her memory  Mood has been okay Still has days when she feels "down"---relates to COVID pandemic and being "scared to go anywhere" They have started to go out to eat some No sig anxiety  Current Outpatient Medications on File Prior to Visit  Medication Sig Dispense Refill   acetaminophen (TYLENOL) 500 MG tablet Take 1,000 mg by mouth every 6 (six) hours as needed for moderate pain or headache.     baclofen (LIORESAL) 10 MG tablet Take 10 mg by mouth daily as needed. For headache     carbamide peroxide (DEBROX) 6.5 % OTIC solution Place 5 drops into the right ear daily as needed (for wax build up).     Multiple Vitamin (MULTIVITAMIN) tablet Take 1 tablet  by mouth daily.     polycarbophil (FIBERCON) 625 MG tablet Take 1 tablet (625 mg total) by mouth 2 (two) times daily as needed.     Potassium Gluconate 550 MG TABS Take 550 mg by mouth 3 (three) times a week.     Probiotic Product (PROBIOTIC PO) Take 1 capsule by mouth daily.     triamcinolone ointment (KENALOG) 0.1 % Apply to affected area 3x/week, increasing to daily with flares. 45 g 1   vitamin B-12 (CYANOCOBALAMIN) 1000 MCG tablet Take 1,000 mcg by mouth daily.     zonisamide (ZONEGRAN) 100 MG capsule Take 100 mg by mouth daily.     No current facility-administered medications on file prior to visit.    Allergies  Allergen Reactions   Clonazepam Swelling and Other (See Comments)    REACTION: Swollen lips, sore mouth   Penicillins Other (See Comments)    Unrecalled reaction Has patient had a PCN reaction causing immediate rash, facial/tongue/throat swelling, SOB or lightheadedness with hypotension: Unknown Has patient had a PCN reaction causing severe rash involving mucus membranes or skin necrosis: Unknown Has patient had a PCN reaction that required hospitalization: Unknown Has patient had a PCN reaction occurring within the last 10 years: No If all of the above answers are "NO", then may proceed with Cephalosporin use.     Past Medical History:  Diagnosis Date   Anxiety    Aortic atherosclerosis (HCC)    GERD (gastroesophageal reflux disease)    Dr. [name]   H/O abnormal weight loss    lost over 20  lbs in the last year   Hard of hearing    deaf in Right ear   History of Bell's palsy 02/2011   HLD (hyperlipidemia)    Dr. Theodoro Kalata (hard of hearing)    uses hearing aid on left side/complete loss of hearing right side   IBS (irritable bowel syndrome)    LBBB (left bundle branch block)    Migraine    OA (osteoarthritis)    Osteopenia 2005   Ulcer 2003   bleeding stomach ulcer    Past Surgical History:  Procedure Laterality Date   [place] N/A 01/20/2019   Procedure: DIAGNOSTIC LAP WITH SMALL BOWEL RESECTION;  Surgeon: Mickeal Skinner, MD;  Location: [facility];  Service: General;  Laterality: N/A;   CARDIAC CATHETERIZATION  1997   okay, mild changes   CHOLECYSTECTOMY  1992   COLONOSCOPY  2003   DEXA N/A 2005   borderline osteopenia   DIAGNOSTIC LAPAROSCOPY  11/25/2018   LAPAROSCOPY N/A 11/25/2018   Procedure: LAPAROSCOPY DIAGNOSTIC;  Surgeon: Mickeal Skinner, MD;  Location: [facility];  Service: General;  Laterality: N/A;   TONSILLECTOMY AND ADENOIDECTOMY  1953   UPPER GASTROINTESTINAL ENDOSCOPY  2003    Family History  Problem Relation Age of Onset   Heart attack Father 55   Diabetes Father    Coronary artery disease Father    COPD Mother    Hypertension Mother    Hypertension Sister    Pulmonary fibrosis Sister    Cancer Daughter        breast   Rheum arthritis Daughter    Colon cancer Neg Hx    Esophageal cancer Neg Hx    Rectal cancer Neg Hx     Social History   Socioeconomic History   Marital status: Married    Spouse name: Jori Moll   Number of children: 2   Years of education: Not on file   Highest education level: Not on file  Occupational History   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  Tobacco Use   Smoking status: Never   Smokeless tobacco: Never  Vaping Use   Vaping Use: Never used  Substance and Sexual Activity   Alcohol use: No   Drug use: No   Sexual activity: Not on file  Other Topics Concern   Not on file  Social History Narrative   Has living will   Husband, then daughters, are [place] health care POA   Would accept resuscitation attempts but no prolonged artifical ventilation   No prolonged tube feeds if cognitively impaired   Social Determinants of Health   Financial Resource Strain: Not on file  Food Insecurity: Not on file  Transportation Needs: Not on file  Physical Activity: Not on file  Stress: Not on file   Social Connections: Not on file  Intimate Partner Violence: Not on file   Review of Systems Appetite is never big--but doing okay Weight is stable Sleeps okay Wears seat belt Teeth are okay---keeps up with dentist No suspicious skin lesions---sees derm No heartburn. Will have rare dysphagia with big pills Bowels variable---no blood No dysuria or hematuria No back or joint pains No chest pain or SOB No dizziness or syncope     Objective:   Physical Exam Constitutional:      Appearance: Normal appearance.  HENT:     Mouth/Throat:     Comments: No lesions Eyes:  Conjunctiva/sclera: Conjunctivae normal.     Pupils: Pupils are equal, round, and reactive to light.  Cardiovascular:     Rate and Rhythm: Normal rate and regular rhythm.     Pulses: Normal pulses.     Heart sounds: No murmur heard.   No gallop.  Pulmonary:     Effort: Pulmonary effort is normal.     Breath sounds: Normal breath sounds. No wheezing or rales.  Abdominal:     Palpations: Abdomen is soft.     Tenderness: There is no abdominal tenderness.  Musculoskeletal:     Cervical back: Neck supple.  Lymphadenopathy:     Cervical: No cervical adenopathy.  Skin:    General: Skin is warm.     Findings: No rash.  Neurological:     Mental Status: She is alert and oriented to person, place, and time.     Comments: President---"Biden, Trump, ?" [id number] Recall 3/3  Psychiatric:        Mood and Affect: Mood normal.        Behavior: Behavior normal.           Assessment & Plan:

## 2021-10-21 DIAGNOSIS — G43719 Chronic migraine without aura, intractable, without status migrainosus: Secondary | ICD-10-CM | POA: Diagnosis not present

## 2021-11-05 ENCOUNTER — Other Ambulatory Visit: Payer: Self-pay

## 2021-11-05 ENCOUNTER — Ambulatory Visit: Payer: Medicare HMO | Admitting: Dermatology

## 2021-11-05 DIAGNOSIS — L821 Other seborrheic keratosis: Secondary | ICD-10-CM | POA: Diagnosis not present

## 2021-11-05 DIAGNOSIS — L900 Lichen sclerosus et atrophicus: Secondary | ICD-10-CM | POA: Diagnosis not present

## 2021-11-05 DIAGNOSIS — L820 Inflamed seborrheic keratosis: Secondary | ICD-10-CM | POA: Diagnosis not present

## 2021-11-05 NOTE — Progress Notes (Signed)
   Follow-Up Visit   Subjective  [name] is a 80 y.o. female who presents for the following: Follow-up (Patient here today for 6 month follow up. Patient reports a rough spot at right temple at hairline she would like checked.Marland Kitchen ). It gets irritated when combing hair.  Patient reports LS&A symptoms controlled with TMC 3x/wk.  The following portions of the chart were reviewed this encounter and updated as appropriate:      Review of Systems: No other skin or systemic complaints except as noted in HPI or Assessment and Plan.   Objective  Well appearing patient in no apparent distress; mood and affect are within normal limits.  A focused examination was performed including pubic area and face. Relevant physical exam findings are noted in the Assessment and Plan.  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    Lichen sclerosus is a chronic inflammatory condition that is NOT sexually transmitted. It requires regular monitoring and treatment to minimize inflammation to reduce risk of scarring. There is also a risk of cancer in the area which is very low if inflammation is well controlled. Regular checks of the area are recommended. Please call if you notice any new or changing spots within this area.   Related Medications triamcinolone ointment (KENALOG) 0.1 % Apply to affected area 3x/week, increasing to daily with flares.  Inflamed seborrheic keratosis right temporal forehead  Destruction of lesion - right temporal forehead  Destruction method: cryotherapy   Informed consent: discussed and consent obtained   Lesion destroyed using liquid nitrogen: Yes    Region frozen until ice ball extended beyond lesion: Yes   Outcome: patient tolerated procedure well with no complications   Post-procedure details: wound care instructions given   Additional details:  Prior to procedure, discussed risks of blister formation, small wound, skin dyspigmentation, or rare scar following cryotherapy. Recommend Vaseline ointment to treated areas while healing.   Seborrheic Keratoses - Stuck-on, waxy, tan-brown papules and/or plaques  - Benign-appearing - Discussed benign etiology and prognosis. - Observe - Call for any changes   Return for 6 months follow up on LS et a. I, Ruthell Rummage, CMA, am acting as scribe for Brendolyn Patty, MD.  Documentation: I have reviewed the above documentation for accuracy and completeness, and I agree with the above.  Brendolyn Patty MD

## 2021-11-05 NOTE — Patient Instructions (Addendum)
Topical steroids (such as triamcinolone, fluocinolone, fluocinonide, mometasone, clobetasol, halobetasol, betamethasone, hydrocortisone) can cause thinning and lightening of the skin if they are used for too long in the same area. Your physician has selected the right strength medicine for your problem and area affected on the body. Please use your medication only as directed by your physician to prevent side effects.    Cryotherapy Aftercare  Wash gently with soap and water everyday.   Apply Vaseline and Band-Aid daily until healed.    Seborrheic Keratosis  What causes seborrheic keratoses? Seborrheic keratoses are harmless, common skin growths that first appear during adult life.  As time goes by, more growths appear.  Some people may develop a large number of them.  Seborrheic keratoses appear on both covered and uncovered body parts.  They are not caused by sunlight.  The tendency to develop seborrheic keratoses can be inherited.  They vary in color from skin-colored to gray, brown, or even black.  They can be either smooth or have a rough, warty surface.   Seborrheic keratoses are superficial and look as if they were stuck on the skin.  Under the microscope this type of keratosis looks like layers upon layers of skin.  That is why at times the top layer may seem to fall off, but the rest of the growth remains and re-grows.    Treatment Seborrheic keratoses do not need to be treated, but can easily be removed in the office.  Seborrheic keratoses often cause symptoms when they rub on clothing or jewelry.  Lesions can be in the way of shaving.  If they become inflamed, they can cause itching, soreness, or burning.  Removal of a seborrheic keratosis can be accomplished by freezing, burning, or surgery. If any spot bleeds, scabs, or grows rapidly, please return to have it checked, as these can be an indication of a skin cancer.   If You Need Anything After Your Visit  If you have any questions  or concerns for your doctor, please call our main line at [phone] and press option 4 to reach your doctor's medical assistant. If no one answers, please leave a voicemail as directed and we will return your call as soon as possible. Messages left after 4 pm will be answered the following business day.   You may also send [place] a message via [place]. We typically respond to MyChart messages within 1-2 business days.  For prescription refills, please ask your pharmacy to contact our office. Our fax number is [phone].  If you have an urgent issue when the clinic is closed that cannot wait until the next business day, you can page your doctor at the number below.    Please note that while we do our best to be available for urgent issues outside of office hours, we are not available 24/7.   If you have an urgent issue and are unable to reach [place], you may choose to seek medical care at your doctor's office, retail clinic, urgent care center, or emergency room.  If you have a medical emergency, please immediately call 911 or go to the emergency department.  Pager Numbers  - Dr. Nehemiah Massed: [phone]  - Dr. Laurence Ferrari: [phone]  - Dr. Nicole Kindred: [phone]  In the event of inclement weather, please call our main line at [phone] for an update on the status of any delays or closures.  Dermatology Medication Tips: Please keep the boxes that topical medications come in in order to help keep track of  the instructions about where and how to use these. Pharmacies typically print the medication instructions only on the boxes and not directly on the medication tubes.   If your medication is too expensive, please contact our office at [phone] option 4 or send [place] a message through [place].   We are unable to tell what your co-pay for medications will be in advance as this is different depending on your insurance coverage. However, we may be able to find a substitute medication at lower cost  or fill out paperwork to get insurance to cover a needed medication.   If a prior authorization is required to get your medication covered by your insurance company, please allow [place] 1-2 business days to complete this process.  Drug prices often vary depending on where the prescription is filled and some pharmacies may offer cheaper prices.  The website www.goodrx.com contains coupons for medications through different pharmacies. The prices here do not account for what the cost may be with help from insurance (it may be cheaper with your insurance), but the website can give you the price if you did not use any insurance.  - You can print the associated coupon and take it with your prescription to the pharmacy.  - You may also stop by our office during regular business hours and pick up a GoodRx coupon card.  - If you need your prescription sent electronically to a different pharmacy, notify our office through [facility] or by phone at [phone] option 4.     Si Usted Necesita Algo Despus de Su Visita  Tambin puede enviarnos un mensaje a travs de Pharmacist, community. Por lo general respondemos a los mensajes de MyChart en el transcurso de 1 a 2 das hbiles.  Para renovar recetas, por favor pida a su farmacia que se ponga en contacto con nuestra oficina. Harland Dingwall de fax es [place] [phone].  Si tiene un asunto urgente cuando la clnica est cerrada y que no puede esperar hasta el siguiente da hbil, puede llamar/localizar a su doctor(a) al nmero que aparece a continuacin.   Por favor, tenga en cuenta que aunque hacemos todo lo posible para estar disponibles para asuntos urgentes fuera del horario de [place], no estamos disponibles las 24 horas del da, los 7 das de la [place].   Si tiene un problema urgente y no puede comunicarse con nosotros, puede optar por buscar atencin mdica  en el consultorio de su doctor(a), en una clnica privada, en un centro de atencin urgente o en una  sala de emergencias.  Si tiene Engineering geologist, por favor llame inmediatamente al 911 o vaya a la sala de emergencias.  Nmeros de bper  - Dr. Nehemiah Massed: [phone]  - Dra. Moye: [phone]  - Dra. Nicole Kindred: [phone]  En caso de inclemencias del [place], por favor llame a Johnsie Kindred principal al [phone] para una actualizacin sobre el [place] de cualquier retraso o cierre.  Consejos para la medicacin en dermatologa: Por favor, guarde las cajas en las que vienen los medicamentos de uso tpico para ayudarle a seguir las instrucciones sobre dnde y cmo usarlos. Las farmacias generalmente imprimen las instrucciones del medicamento slo en las cajas y no directamente en los tubos del [place].   Si su medicamento es muy caro, por favor, pngase en contacto con Zigmund Daniel llamando al [phone] y presione la opcin 4 o envenos un mensaje a travs de Pharmacist, community.   No podemos decirle cul ser su copago por los medicamentos por adelantado  ya que esto es diferente dependiendo de la cobertura de su seguro. Sin embargo, es posible que podamos encontrar un medicamento sustituto a Electrical engineer un formulario para que el seguro cubra el medicamento que se considera necesario.   Si se requiere una autorizacin previa para que su compaa de seguros [place] su medicamento, por favor permtanos de 1 a 2 das hbiles para completar este proceso.  Los precios de los medicamentos varan con frecuencia dependiendo del Environmental consultant de dnde se surte la receta y alguna farmacias pueden ofrecer precios ms baratos.  El sitio web www.goodrx.com tiene cupones para medicamentos de Airline pilot. Los precios aqu no tienen en cuenta lo que podra costar con la ayuda del seguro (puede ser ms barato con su seguro), pero el sitio web puede darle el precio si no utiliz Research scientist (physical sciences).  - Puede imprimir el cupn correspondiente y llevarlo con su receta a la farmacia.  - Tambin puede  pasar por nuestra oficina durante el horario de atencin regular y Charity fundraiser una tarjeta de cupones de GoodRx.  - Si necesita que su receta se enve electrnicamente a una farmacia diferente, informe a nuestra oficina a travs de MyChart de [place] o por telfono llamando al [phone] y presione la opcin 4.

## 2021-11-21 ENCOUNTER — Telehealth: Payer: Medicare HMO | Admitting: Family Medicine

## 2021-11-21 ENCOUNTER — Telehealth (INDEPENDENT_AMBULATORY_CARE_PROVIDER_SITE_OTHER): Payer: Medicare HMO | Admitting: Family Medicine

## 2021-11-21 ENCOUNTER — Other Ambulatory Visit: Payer: Self-pay

## 2021-11-21 ENCOUNTER — Encounter: Payer: Self-pay | Admitting: Family Medicine

## 2021-11-21 VITALS — Temp 98.2°F | Wt 115.0 lb

## 2021-11-21 DIAGNOSIS — R059 Cough, unspecified: Secondary | ICD-10-CM | POA: Diagnosis not present

## 2021-11-21 DIAGNOSIS — R0981 Nasal congestion: Secondary | ICD-10-CM | POA: Diagnosis not present

## 2021-11-21 MED ORDER — BENZONATATE 100 MG PO CAPS: ORAL_CAPSULE | ORAL | 0 refills | Status: DC

## 2021-11-21 NOTE — Patient Instructions (Addendum)
°  HOME CARE TIPS:  -COVID19 testing information: ForwardDrop.tn  Most pharmacies also offer testing and home test kits. If the Covid19 test is positive, it is in the 1st 5 days of symptoms and you desire antiviral treatment, please contact a [place] or schedule a follow up virtual visit through your primary care office or through the [employer].  Other test to treat options: ConnectRV.is?click_[email or web address]=alert  -I sent the medication(s) we discussed to your pharmacy: Meds ordered this encounter  Medications   benzonatate (TESSALON PERLES) 100 MG capsule    Sig: 1-2 capsules up to twice daily as needed for cough.    Dispense:  40 capsule    Refill:  0     -can use tylenol if needed for fevers, aches and pains per instructions  -can use nasal saline a few times per day if you have nasal congestion  -stay hydrated, drink plenty of fluids and eat small healthy meals - avoid dairy  -can take 1000 IU (79mg) Vit D3 and 100-500 mg of Vit C daily per instructions  -If the Covid test is positive, check out the [facility] for more information on home care, transmission and treatment for COVID19  -follow up with your doctor in 2-3 days unless improving and feeling better  -stay home while sick, except to seek medical care. If you have COVID19, you will likely be contagious for 7-10 days. Flu or Influenza is likely contagious for about 7 days. Other respiratory viral infections remain contagious for 5-10+ days depending on the virus and many other factors. Wear a good mask that fits snugly (such as N95 or KN95) if around others to reduce the risk of transmission.  It was nice to meet you today, and I really hope you are feeling better soon. I help [name] out with telemedicine visits on Tuesdays and Thursdays and am happy to help if you need a follow up virtual visit on those days. Otherwise, if you have any concerns or  questions following this visit please schedule a follow up visit with your Primary Care doctor or seek care at a local urgent care clinic to avoid delays in care.    Seek in person care or schedule a follow up video visit promptly if your symptoms worsen, new concerns arise or you are not improving with treatment. Call 911 and/or seek emergency care if your symptoms are severe or life threatening.

## 2021-11-21 NOTE — Progress Notes (Signed)
Virtual Visit via Video Note  I connected with [name]  on 11/21/21 at  1:20 PM EST by a video enabled telemedicine application and verified that I am speaking with the correct person using two identifiers.  Location patient: home, [place] Location provider:work or home office Persons participating in the virtual visit: patient, provider  I discussed the limitations of evaluation and management by telemedicine and the availability of in person appointments. The patient expressed understanding and agreed to proceed.   HPI:  Acute telemedicine visit for cough and congestion: -Onset: 4-5 days ago -had a negative covid test -Symptoms include:cough, nasal congestion, sore throat, eyes a little irritated and watery -Denies:fever, CP, SOB, NVD, inability to eat/drink/get out of bed -Has tried: Robitussin -Pertinent past medical history: see below -Pertinent medication allergies:  Allergies  Allergen Reactions   Clonazepam Swelling and Other (See Comments)    REACTION: Swollen lips, sore mouth   Penicillins Other (See Comments)    Unrecalled reaction Has patient had a PCN reaction causing immediate rash, facial/tongue/throat swelling, SOB or lightheadedness with hypotension: Unknown Has patient had a PCN reaction causing severe rash involving mucus membranes or skin necrosis: Unknown Has patient had a PCN reaction that required hospitalization: Unknown Has patient had a PCN reaction occurring within the last 10 years: No If all of the above answers are "NO", then may proceed with Cephalosporin use.   -COVID-19 vaccine status:  Immunization History  Administered Date(s) Administered   Fluad Quad(high Dose 65+) 08/11/2019, 09/19/2020   Influenza Split 08/27/2012   Influenza Whole 10/15/2007, 09/26/2009, 10/01/2010   Influenza,inj,Quad PF,6+ Mos 09/24/2015, 09/24/2016, 09/24/2017, 09/09/2018   Influenza-Unspecified 09/21/2014   PFIZER(Purple Top)SARS-COV-2 Vaccination 12/23/2019, 01/13/2020,  09/11/2020, 03/20/2021   Pneumococcal Conjugate-13 06/04/2015   Pneumococcal Polysaccharide-23 08/04/2008   Pneumococcal-Unspecified 02/20/2011   Td 12/08/2001, 04/16/2012   Varicella 02/20/2011    ROS: See pertinent positives and negatives per HPI.  Past Medical History:  Diagnosis Date   Anxiety    Aortic atherosclerosis ([place])    GERD (gastroesophageal reflux disease)    Dr. 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 Theodoro Kalata (hard of hearing)    uses hearing aid on left side/complete loss of hearing right side   IBS (irritable bowel syndrome)    LBBB (left bundle branch block)    Migraine    OA (osteoarthritis)    Osteopenia 2005   Ulcer 2003   bleeding stomach ulcer    Past Surgical History:  Procedure Laterality Date   [place] N/A 01/20/2019   Procedure: DIAGNOSTIC LAP WITH SMALL BOWEL RESECTION;  Surgeon: Kieth Brightly Arta Bruce, MD;  Location: [facility];  Service: General;  Laterality: N/A;   [place]   okay, mild changes   [place]  2003   DEXA N/A 2005   borderline osteopenia   DIAGNOSTIC LAPAROSCOPY  11/25/2018   LAPAROSCOPY N/A 11/25/2018   Procedure: LAPAROSCOPY DIAGNOSTIC;  Surgeon: Mickeal Skinner, MD;  Location: [facility];  Service: General;  Laterality: N/A;   [place] ENDOSCOPY  2003     Current Outpatient Medications:    baclofen (LIORESAL) 10 MG tablet, Take  10 mg by mouth daily as needed. For headache, Disp: , Rfl:    benzonatate (TESSALON PERLES) 100 MG capsule, 1-2 capsules up to twice daily as needed for cough., Disp: 40 capsule, Rfl: 0   carbamide peroxide (DEBROX) 6.5 % OTIC solution, Place 5 drops into the right ear daily as needed (for wax  build up)., Disp: , Rfl:    Multiple Vitamin (MULTIVITAMIN) tablet, Take 1 tablet by mouth daily., Disp: , Rfl:    polycarbophil (FIBERCON) 625 MG tablet, Take 1 tablet (625 mg total) by mouth 2 (two) times daily as needed., Disp: , Rfl:    Potassium Gluconate 550 MG TABS, Take 550 mg by mouth 3 (three) times a week., Disp: , Rfl:    Probiotic Product (PROBIOTIC PO), Take 1 capsule by mouth daily., Disp: , Rfl:    triamcinolone ointment (KENALOG) 0.1 %, Apply to affected area 3x/week, increasing to daily with flares., Disp: 45 g, Rfl: 1   vitamin B-12 (CYANOCOBALAMIN) 1000 MCG tablet, Take 1,000 mcg by mouth daily., Disp: , Rfl:    zonisamide (ZONEGRAN) 100 MG capsule, Take 100 mg by mouth daily., Disp: , Rfl:   EXAM:  VITALS per patient if applicable: denies fever on her check this morning; O2 99  GENERAL: alert, oriented, appears well and in no acute distress  HEENT: atraumatic, conjunttiva clear, no obvious abnormalities on inspection of external nose and ears  NECK: normal movements of the head and neck  LUNGS: on inspection no signs of respiratory distress, breathing rate appears normal, no obvious gross SOB, gasping or wheezing  CV: no obvious cyanosis  MS: moves all visible extremities without noticeable abnormality  PSYCH/NEURO: pleasant and cooperative, no obvious depression or anxiety, speech and thought processing grossly intact  ASSESSMENT AND PLAN:  Discussed the following assessment and plan:  Nasal congestion  Cough, unspecified type  -we discussed possible serious and likely etiologies, options for evaluation and workup, limitations of telemedicine visit vs in person visit, treatment, treatment risks and precautions. Pt is agreeable to treatment via telemedicine at this moment. Likely adenovirus given the eye symptoms as this has been circulating in the community vs other. She has opted for tessalon for cough, nasal saline and other symptomatic care measures per pt  instructions. She agrees to test once more for covid.  Advised to seek prompt VV or in person care if worsening, new symptoms arise, or if is not improving with treatment. Discussed options for inperson care if PCP office not available. Did let this patient know that I do telemedicine on Tuesdays and Thursdays for [place]. Advised to schedule follow up visit with PCP or [facility] if any further questions or concerns to avoid delays in care.   I discussed the assessment and treatment plan with the patient. The patient was provided an opportunity to ask questions and all were answered. The patient agreed with the plan and demonstrated an understanding of the instructions.     Lucretia Kern, DO

## 2021-11-29 ENCOUNTER — Telehealth: Payer: Self-pay | Admitting: Internal Medicine

## 2021-11-29 MED ORDER — AMOXICILLIN 500 MG PO TABS: 1000.0000 mg | ORAL_TABLET | Freq: Two times a day (BID) | ORAL | 0 refills | Status: AC

## 2021-11-29 MED ORDER — CLORAZEPATE DIPOTASSIUM 7.5 MG PO TABS: 7.5000 mg | ORAL_TABLET | Freq: Two times a day (BID) | ORAL | 0 refills | Status: DC | PRN

## 2021-11-29 NOTE — Telephone Encounter (Signed)
Pt daughter called asking if something for cough can be called in for pt. Pt daughter states that any of the medication is working. Pt daughter also would like something called in to calm pt down. Please advise.

## 2021-11-29 NOTE — Telephone Encounter (Signed)
She did a virtual visit 11-21-21. Has been taking benzonatate and robitussin and muciex which have not been helping. Also using saline sinus spray. Uses [facility] [place] says she is very anxious due to illness. Thinks she needs something to calm her. Said she took something in the past.

## 2021-11-29 NOTE — Telephone Encounter (Signed)
Spoke to daughter.

## 2021-11-29 NOTE — Telephone Encounter (Signed)
Please let her know that I sent an antibiotic for what may be a sinus infection now---and a few of the clorazepate tranquilizer that she used to use in the past

## 2021-11-29 NOTE — Telephone Encounter (Signed)
Left message on vm to get more information.

## 2021-12-17 ENCOUNTER — Ambulatory Visit (INDEPENDENT_AMBULATORY_CARE_PROVIDER_SITE_OTHER): Payer: Medicare HMO | Admitting: Internal Medicine

## 2021-12-17 ENCOUNTER — Other Ambulatory Visit: Payer: Self-pay

## 2021-12-17 ENCOUNTER — Encounter: Payer: Self-pay | Admitting: Internal Medicine

## 2021-12-17 DIAGNOSIS — R059 Cough, unspecified: Secondary | ICD-10-CM | POA: Insufficient documentation

## 2021-12-17 DIAGNOSIS — F39 Unspecified mood [affective] disorder: Secondary | ICD-10-CM | POA: Diagnosis not present

## 2021-12-17 DIAGNOSIS — M940 Chondrocostal junction syndrome [Tietze]: Secondary | ICD-10-CM | POA: Insufficient documentation

## 2021-12-17 DIAGNOSIS — R69 Illness, unspecified: Secondary | ICD-10-CM | POA: Diagnosis not present

## 2021-12-17 DIAGNOSIS — G3184 Mild cognitive impairment, so stated: Secondary | ICD-10-CM

## 2021-12-17 MED ORDER — HYDROCODONE BIT-HOMATROP MBR 5-1.5 MG/5ML PO SOLN: 5.0000 mL | Freq: Every evening | ORAL | 0 refills | Status: DC | PRN

## 2021-12-17 MED ORDER — DONEPEZIL HCL 5 MG PO TABS: 5.0000 mg | ORAL_TABLET | Freq: Every day | ORAL | 3 refills | Status: DC

## 2021-12-17 NOTE — Progress Notes (Signed)
Subjective:    Patient ID: [name], female    DOB: [birth year], 81 y.o.   MRN: [id number]  HPI Here with daughter due to follow up on respiratory infection and anxiety  Seems better since the antibiotic about 3 weeks ago Head and chest congestion had started 12/8 Drainage and coughing stuff up No fever Did virtual visit--had benzonatate but they didn't help Then I prescribed the antibiotic on 12/23 3 negative COVID tests Now finally feeling better---but persistent cough  Now notices pain in left chest---goes directly into the back. Mostly in the afternoon (though pleurisy) Tried ibuprofen daily---helped some Not provoked by cough No SOB now--no pain with deep breaths  Had become a "major emotional wreck" Was supposed to be hosting Christmas This is better Has used a few of the clorazepate (like 5-6)--no more than one a day No depression Gets anxious due to ongoing memory problems  Current Outpatient Medications on File Prior to Visit  Medication Sig Dispense Refill   baclofen (LIORESAL) 10 MG tablet Take 10 mg by mouth daily as needed. For headache     carbamide peroxide (DEBROX) 6.5 % OTIC solution Place 5 drops into the right ear daily as needed (for wax build up).     clorazepate (TRANXENE) 7.5 MG tablet Take 1 tablet (7.5 mg total) by mouth 2 (two) times daily as needed for anxiety. 20 tablet 0   Multiple Vitamin (MULTIVITAMIN) tablet Take 1 tablet by mouth daily.     polycarbophil (FIBERCON) 625 MG tablet Take 1 tablet (625 mg total) by mouth 2 (two) times daily as needed.     Potassium Gluconate 550 MG TABS Take 550 mg by mouth 3 (three) times a week.     Probiotic Product (PROBIOTIC PO) Take 1 capsule by mouth daily.     triamcinolone ointment (KENALOG) 0.1 % Apply to affected area 3x/week, increasing to daily with flares. 45 g 1   vitamin B-12 (CYANOCOBALAMIN) 1000 MCG tablet Take 1,000 mcg by mouth daily.     zonisamide (ZONEGRAN) 100 MG capsule Take 100 mg by  mouth daily.     No current facility-administered medications on file prior to visit.    Allergies  Allergen Reactions   Clonazepam Swelling and Other (See Comments)    REACTION: Swollen lips, sore mouth   Penicillins Other (See Comments)    Unrecalled reaction Has patient had a PCN reaction causing immediate rash, facial/tongue/throat swelling, SOB or lightheadedness with hypotension: Unknown Has patient had a PCN reaction causing severe rash involving mucus membranes or skin necrosis: Unknown Has patient had a PCN reaction that required hospitalization: Unknown Has patient had a PCN reaction occurring within the last 10 years: No If all of the above answers are "NO", then may proceed with Cephalosporin use.     Past Medical History:  Diagnosis Date   Anxiety    Aortic atherosclerosis ([facility])    GERD (gastroesophageal reflux disease)    Dr. 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 Theodoro Kalata (hard of hearing)    uses hearing aid on left side/complete loss of hearing right side   IBS (irritable bowel syndrome)    LBBB (left bundle branch block)    Migraine    OA (osteoarthritis)    Osteopenia  2005   Ulcer 2003   bleeding stomach ulcer    Past Surgical History:  Procedure Laterality Date   ABDOMINAL HYSTERECTOMY  1983   APPENDECTOMY  1983   BOWEL RESECTION N/A 01/20/2019   Procedure: DIAGNOSTIC LAP WITH SMALL BOWEL RESECTION;  Surgeon: Kieth Brightly, Arta Bruce, MD;  Location: [facility];  Service: General;  Laterality: N/A;   CARDIAC CATHETERIZATION  1997   okay, mild changes   CHOLECYSTECTOMY  1992   COLONOSCOPY  2003   DEXA N/A 2005   borderline osteopenia   DIAGNOSTIC LAPAROSCOPY  11/25/2018   LAPAROSCOPY N/A 11/25/2018   Procedure: LAPAROSCOPY DIAGNOSTIC;  Surgeon: Mickeal Skinner, MD;  Location: [facility];  Service: General;   Laterality: N/A;   TONSILLECTOMY AND ADENOIDECTOMY  1953   UPPER GASTROINTESTINAL ENDOSCOPY  2003    Family History  Problem Relation Age of Onset   Heart attack Father 1   Diabetes Father    Coronary artery disease Father    COPD Mother    Hypertension Mother    Hypertension Sister    Pulmonary fibrosis Sister    Cancer Daughter        breast   Rheum arthritis Daughter    Colon cancer Neg Hx    Esophageal cancer Neg Hx    Rectal cancer Neg Hx     Social History   Socioeconomic History   Marital status: Married    Spouse name: Jori Moll   Number of children: 2   Years of education: Not on file   Highest education level: Not on file  Occupational History   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  Tobacco Use   Smoking status: Never   Smokeless tobacco: Never  Vaping Use   Vaping Use: Never used  Substance and Sexual Activity   Alcohol use: No   Drug use: No   Sexual activity: Not on file  Other Topics Concern   Not on file  Social History Narrative   Has living will   Husband, then daughters, are [facility] health care POA   Would accept resuscitation attempts but no prolonged artifical ventilation   No prolonged tube feeds if cognitively impaired   Social Determinants of Health   Financial Resource Strain: Not on file  Food Insecurity: Not on file  Transportation Needs: Not on file  Physical Activity: Not on file  Stress: Not on file  Social Connections: Not on file  Intimate Partner Violence: Not on file   Review of Systems No heartburn or dysphagia Sleep has been affected by the cough    Objective:   Physical Exam Constitutional:      Appearance: Normal appearance.  Cardiovascular:     Rate and Rhythm: Normal rate and regular rhythm.     Heart sounds: Normal heart sounds. No murmur heard.   No friction rub. No gallop.  Pulmonary:     Effort: Pulmonary effort is normal.     Breath sounds: Normal breath sounds. No wheezing or rales.      Comments: Tenderness over costochondral junction on left ~T5-6 Musculoskeletal:     Cervical back: Neck supple.  Lymphadenopathy:     Cervical: No cervical adenopathy.  Neurological:     Mental Status: She is alert.  Psychiatric:     Comments: anxious           Assessment & Plan:

## 2021-12-17 NOTE — Assessment & Plan Note (Signed)
Reassured--not heart or lungs Discussed using diclofenac gel tid on that

## 2021-12-17 NOTE — Assessment & Plan Note (Signed)
Some worse---though no clear functional decline Discussed trial of donepezil 33m.  Reviewed potential GI side effects She wants to start

## 2021-12-17 NOTE — Assessment & Plan Note (Signed)
Seems to be post infectious Will give hydrocodone syrup just to help her sleep

## 2021-12-17 NOTE — Assessment & Plan Note (Signed)
Ongoing anxiety--mostly related to memory issues Has the clorazepate 7.92m for occasional prn use

## 2022-01-15 ENCOUNTER — Encounter: Payer: Self-pay | Admitting: *Deleted

## 2022-01-15 ENCOUNTER — Emergency Department: Payer: Medicare HMO

## 2022-01-15 ENCOUNTER — Observation Stay
Admission: EM | Admit: 2022-01-15 | Discharge: 2022-01-16 | Disposition: A | Discharge status: Home / Self Care | Payer: Medicare HMO | Attending: Internal Medicine | Admitting: Internal Medicine

## 2022-01-15 ENCOUNTER — Other Ambulatory Visit: Payer: Self-pay

## 2022-01-15 DIAGNOSIS — Z79899 Other long term (current) drug therapy: Secondary | ICD-10-CM | POA: Diagnosis not present

## 2022-01-15 DIAGNOSIS — M79601 Pain in right arm: Secondary | ICD-10-CM

## 2022-01-15 DIAGNOSIS — Z20822 Contact with and (suspected) exposure to covid-19: Secondary | ICD-10-CM | POA: Insufficient documentation

## 2022-01-15 DIAGNOSIS — Q283 Other malformations of cerebral vessels: Secondary | ICD-10-CM | POA: Diagnosis not present

## 2022-01-15 DIAGNOSIS — I2510 Atherosclerotic heart disease of native coronary artery without angina pectoris: Secondary | ICD-10-CM | POA: Diagnosis not present

## 2022-01-15 DIAGNOSIS — I10 Essential (primary) hypertension: Secondary | ICD-10-CM | POA: Insufficient documentation

## 2022-01-15 DIAGNOSIS — Z9049 Acquired absence of other specified parts of digestive tract: Secondary | ICD-10-CM | POA: Insufficient documentation

## 2022-01-15 DIAGNOSIS — R200 Anesthesia of skin: Secondary | ICD-10-CM | POA: Diagnosis not present

## 2022-01-15 DIAGNOSIS — J3489 Other specified disorders of nose and nasal sinuses: Secondary | ICD-10-CM | POA: Diagnosis not present

## 2022-01-15 DIAGNOSIS — I700 Atherosclerosis of aorta: Secondary | ICD-10-CM | POA: Diagnosis not present

## 2022-01-15 DIAGNOSIS — G459 Transient cerebral ischemic attack, unspecified: Principal | ICD-10-CM | POA: Insufficient documentation

## 2022-01-15 DIAGNOSIS — R079 Chest pain, unspecified: Secondary | ICD-10-CM | POA: Diagnosis not present

## 2022-01-15 DIAGNOSIS — R519 Headache, unspecified: Secondary | ICD-10-CM | POA: Diagnosis not present

## 2022-01-15 DIAGNOSIS — E041 Nontoxic single thyroid nodule: Secondary | ICD-10-CM | POA: Diagnosis not present

## 2022-01-15 DIAGNOSIS — Z8673 Personal history of transient ischemic attack (TIA), and cerebral infarction without residual deficits: Secondary | ICD-10-CM | POA: Diagnosis present

## 2022-01-15 DIAGNOSIS — R29818 Other symptoms and signs involving the nervous system: Secondary | ICD-10-CM | POA: Diagnosis not present

## 2022-01-15 DIAGNOSIS — R202 Paresthesia of skin: Secondary | ICD-10-CM | POA: Diagnosis not present

## 2022-01-15 DIAGNOSIS — K7689 Other specified diseases of liver: Secondary | ICD-10-CM | POA: Diagnosis not present

## 2022-01-15 DIAGNOSIS — K6389 Other specified diseases of intestine: Secondary | ICD-10-CM | POA: Diagnosis not present

## 2022-01-15 LAB — DIFFERENTIAL
Abs Immature Granulocytes: 0.01 10*3/uL (ref 0.00–0.07)
Basophils Absolute: 0 10*3/uL (ref 0.0–0.1)
Basophils Relative: 1 %
Eosinophils Absolute: 0.2 10*3/uL (ref 0.0–0.5)
Eosinophils Relative: 4 %
Immature Granulocytes: 0 %
Lymphocytes Relative: 33 %
Lymphs Abs: 1.7 10*3/uL (ref 0.7–4.0)
Monocytes Absolute: 0.5 10*3/uL (ref 0.1–1.0)
Monocytes Relative: 9 %
Neutro Abs: 2.8 10*3/uL (ref 1.7–7.7)
Neutrophils Relative %: 53 %

## 2022-01-15 LAB — CBC
HCT: 41.7 % (ref 36.0–46.0)
Hemoglobin: 14 g/dL (ref 12.0–15.0)
MCH: 31.1 pg (ref 26.0–34.0)
MCHC: 33.6 g/dL (ref 30.0–36.0)
MCV: 92.7 fL (ref 80.0–100.0)
Platelets: 178 10*3/uL (ref 150–400)
RBC: 4.5 MIL/uL (ref 3.87–5.11)
RDW: 12.4 % (ref 11.5–15.5)
WBC: 5.2 10*3/uL (ref 4.0–10.5)
nRBC: 0 % (ref 0.0–0.2)

## 2022-01-15 LAB — PROTIME-INR
INR: 0.9 (ref 0.8–1.2)
Prothrombin Time: 12.1 seconds (ref 11.4–15.2)

## 2022-01-15 LAB — CK: Total CK: 100 U/L (ref 38–234)

## 2022-01-15 LAB — APTT: aPTT: 26 seconds (ref 24–36)

## 2022-01-15 IMAGING — DX DG CHEST 1V PORT
1 series · 1 of 1 positions shown · non-contrast
Comparison: None.

CLINICAL DATA: Hypertension, right arm pain, back pain

EXAM:
PORTABLE CHEST 1 VIEW

[chest ap]
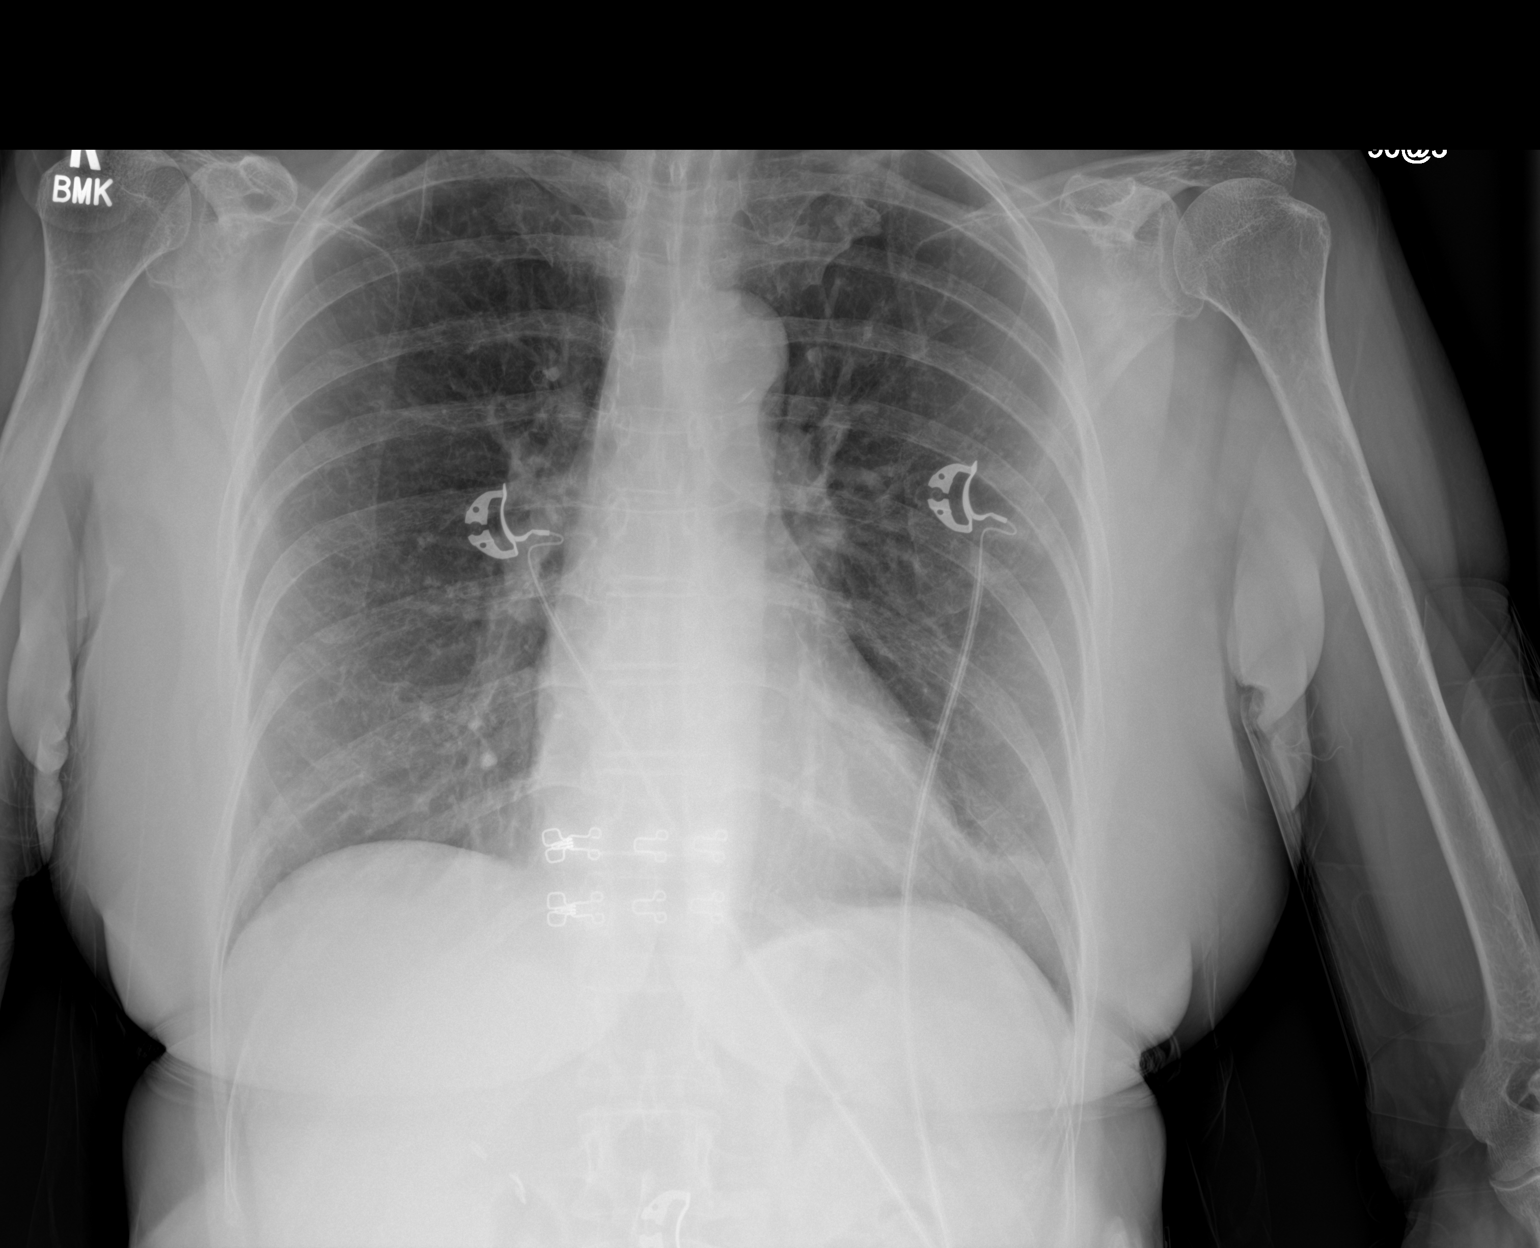

[1 of 1 positions shown; findings below may reference images not displayed]

FINDINGS: The heart size and mediastinal contours are within normal limits.
Both lungs are clear. The visualized skeletal structures are
unremarkable.
IMPRESSION: No active disease.

## 2022-01-15 IMAGING — CT CT HEAD W/O CM
4 series · 17 of 47 positions shown, 19 images · non-contrast
Comparison: [DATE]

CLINICAL DATA: Right arm numbness. Neuro deficit, acute, stroke
suspected



[Series 2: head wo · axial · 0.41mm/px · z∈[-154,-44]mm · 7 of 30 slices shown, 9 images]
[im 4/30  brain]
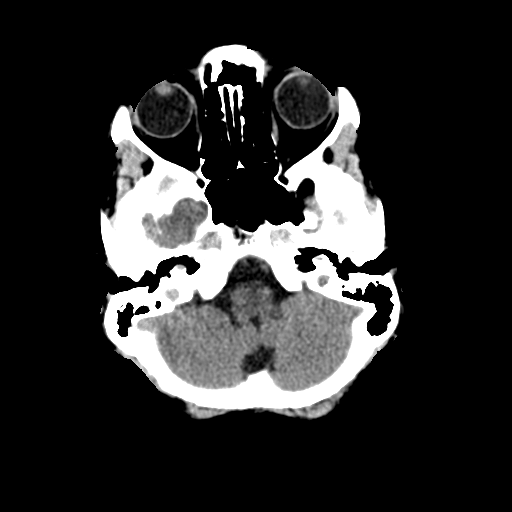
[im 4/30  bone]
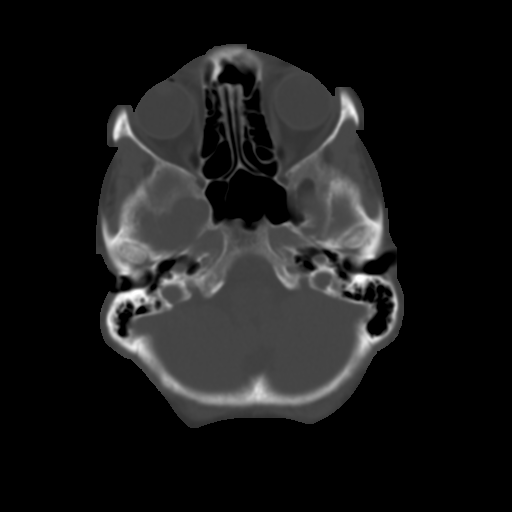
[im 8/30  brain]
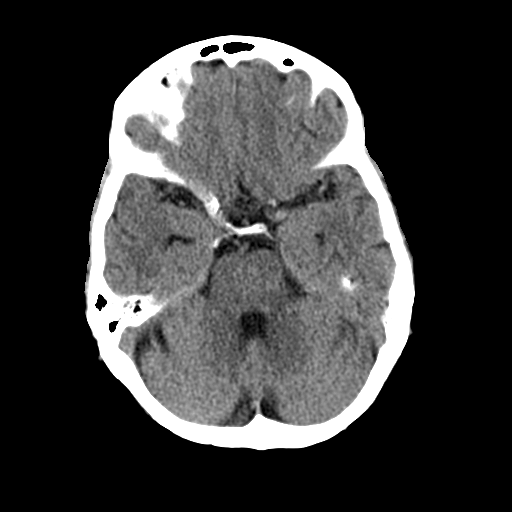
[im 11/30  brain]
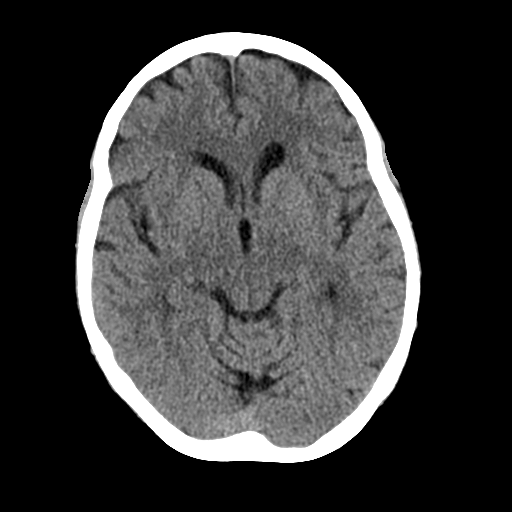
[im 15/30  brain]
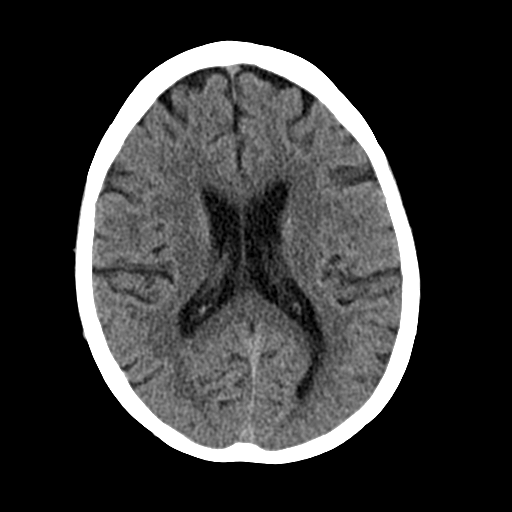
[im 19/30  brain]
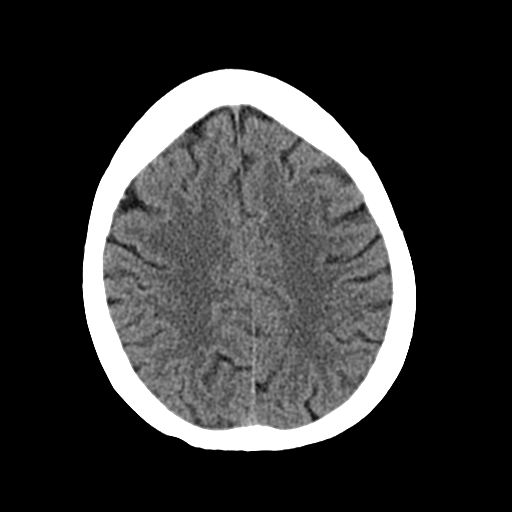
[im 19/30  bone]
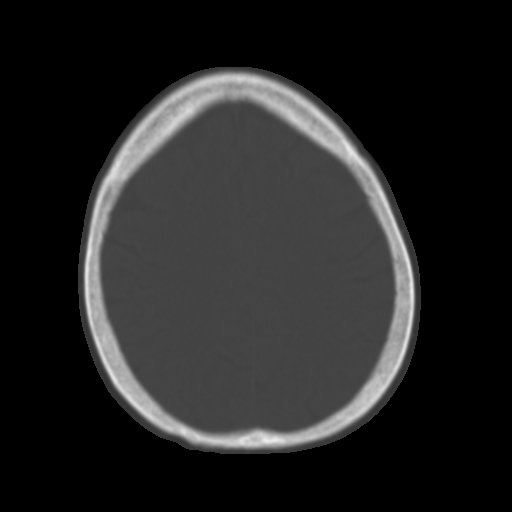
[im 22/30  brain]
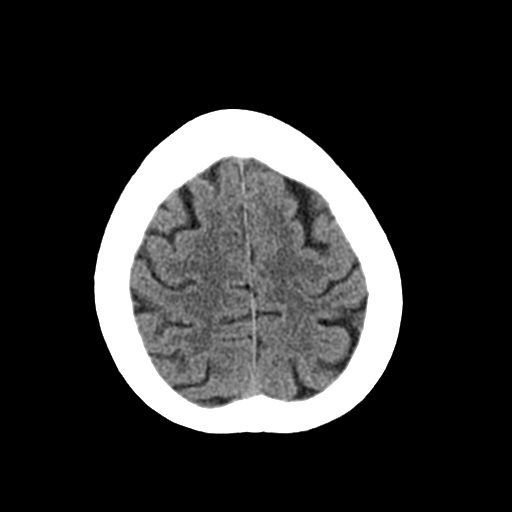
[im 26/30  brain]
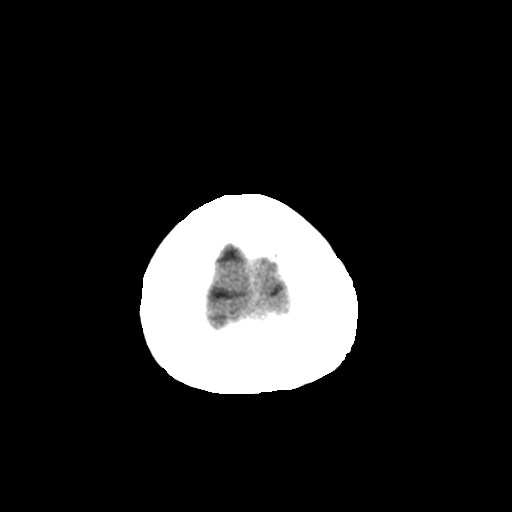

[Series 3: head bone · axial · 0.41mm/px · z∈[-155,-105]mm · 4 of 73 slices shown]
[im 8/73  bone]
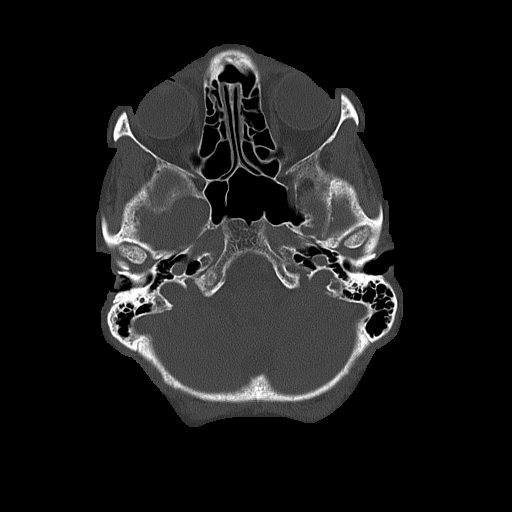
[im 15/73  bone]
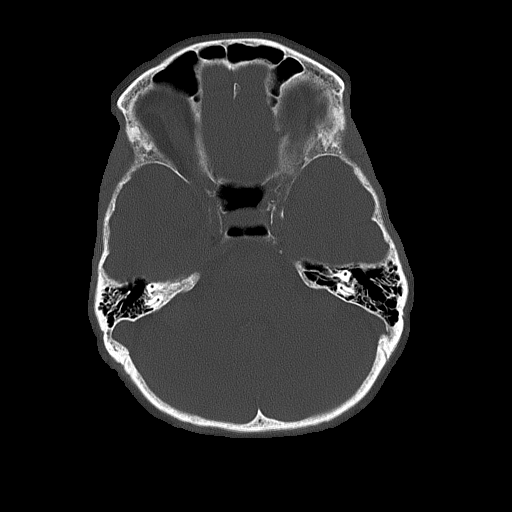
[im 22/73  bone]
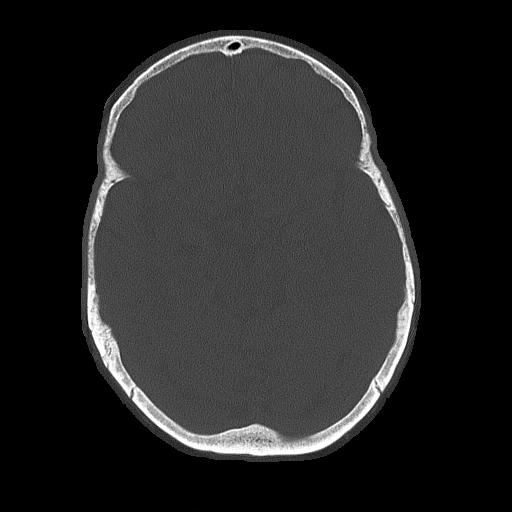
[im 33/73  bone]
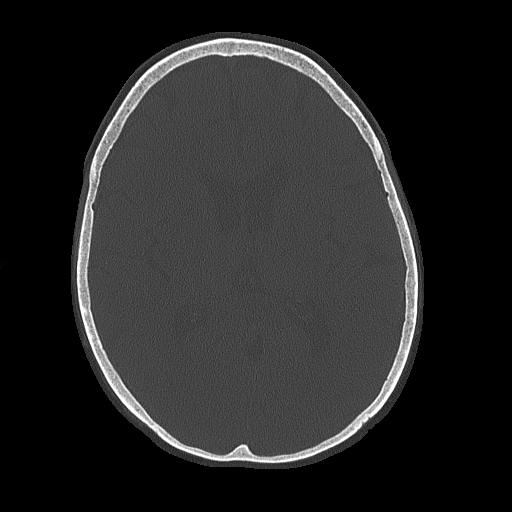

[Series 4: coronal soft tissue · coronal · 0.30mm/px · 3 of 62 slices shown]
[im 21/62  brain]
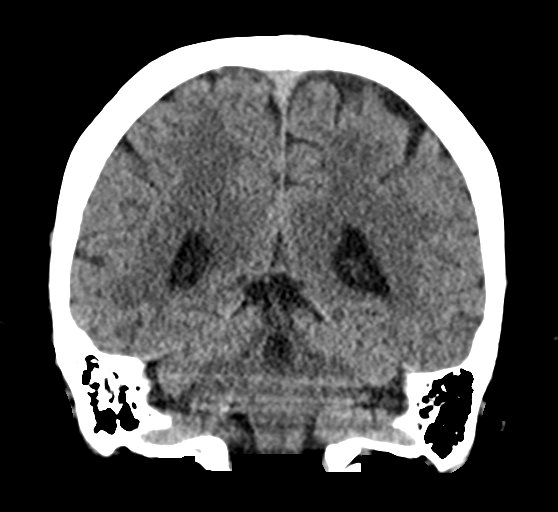
[im 28/62  brain]
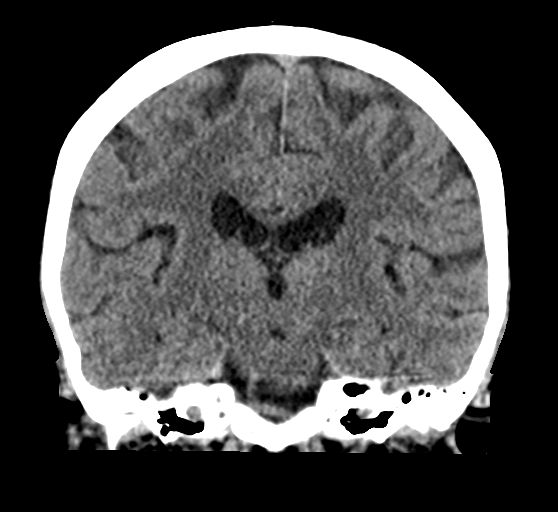
[im 34/62  brain]
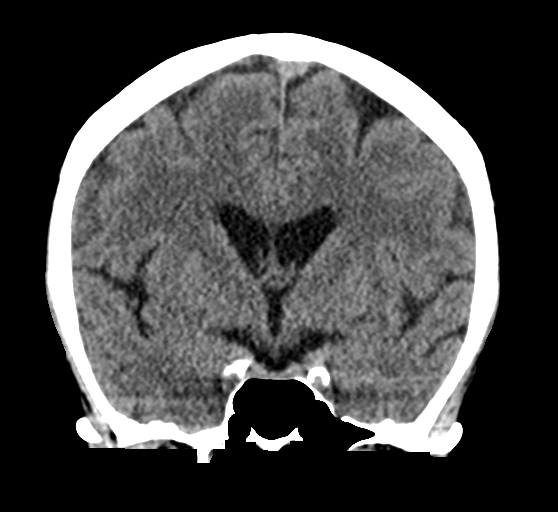

[Series 5: sagittal soft tissue · sagittal · 0.30mm/px · 3 of 52 slices shown]
[im 18/52  brain]
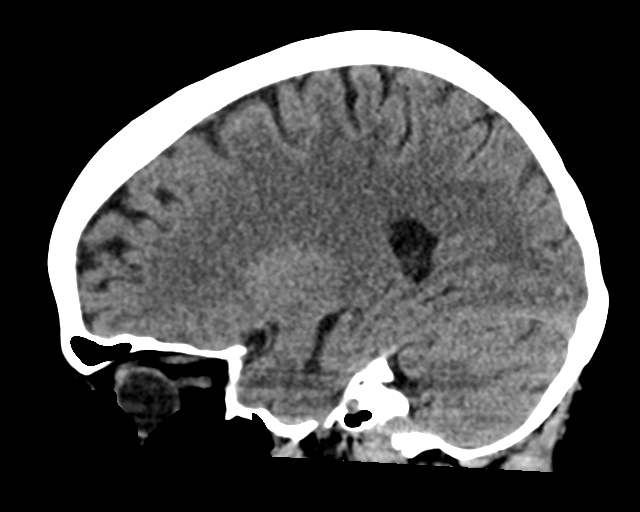
[im 26/52  brain]
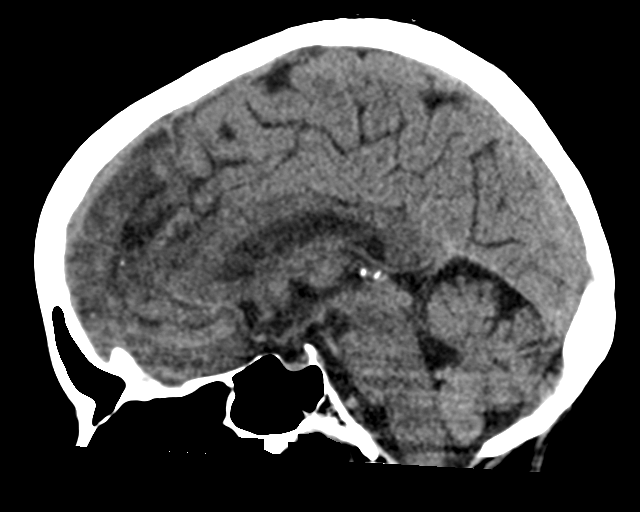
[im 35/52  brain]
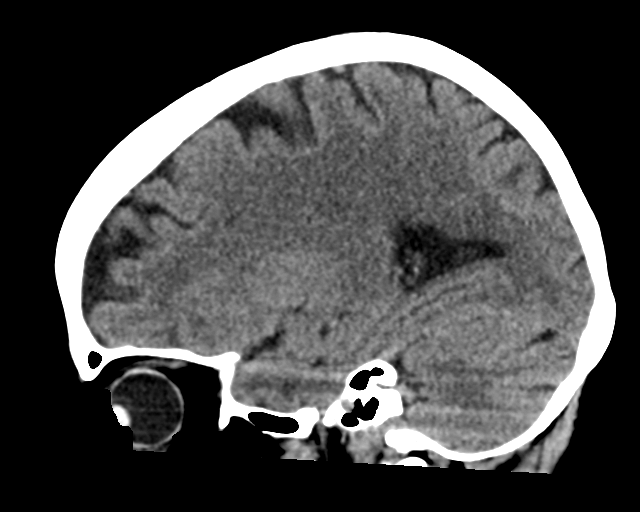

[17 of 47 positions shown; findings below may reference images not displayed]

FINDINGS: Brain: No acute intracranial abnormality. Specifically, no
hemorrhage, hydrocephalus, mass lesion, acute infarction, or
significant intracranial injury.

Vascular: No hyperdense vessel or unexpected calcification.

Skull: No acute calvarial abnormality.

Sinuses/Orbits: No acute findings

Other: None
IMPRESSION: Normal study.

## 2022-01-15 MED ORDER — LABETALOL HCL 5 MG/ML IV SOLN
10.0000 mg | Freq: Once | INTRAVENOUS | Status: AC
  Administered 2022-01-15: 10 mg via INTRAVENOUS
  Filled 2022-01-15: qty 4

## 2022-01-15 NOTE — ED Notes (Signed)
Patient transported to CT 

## 2022-01-15 NOTE — ED Provider Notes (Signed)
[facility] Provider Note    Event Date/Time   First MD Initiated Contact with Patient 01/15/22 2259     (approximate)   History   Extremity Weakness   HPI  [name] is a 81 y.o. female with history of aortic atherosclerosis, hyperlipidemia, Crohn's disease, left bundle branch block who presents to the emergency department with her daughters for concerns for right upper extremity pain, cramping and numbness that started tonight while watching television around 7:15 PM.  She states cramping came on suddenly and states the pain was so severe she began to cry.  She went to the bathroom to run hot water over her hand but this did not relieve her symptoms.  She states her arm felt numb from the forearm down.  Symptoms lasted about 30 minutes and had resolved but she is still having intermittent pain in the right arm and in the left upper back.  No chest pain or abdominal pain.  No headache.  She is extremely hypertensive here and denies history of hypertension and is not on any medications for blood pressure.  No head injury.  Not on blood thinners.  No other numbness or focal weakness.  She denies any recent medication changes.  No vomiting but has chronic diarrhea which is unchanged.  Has never had similar symptoms.  Denies any vision or speech changes.  She states the only thing that she did different today than normal was increased exertion while doing yard work with her husband.   History provided by patient and daughters.    Past Medical History:  Diagnosis Date   Anxiety    Aortic atherosclerosis ([place])    GERD (gastroesophageal reflux disease)    Dr. 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 Theodoro Kalata (hard of hearing)    uses hearing aid on left side/complete loss of hearing right side   IBS (irritable bowel  syndrome)    LBBB (left bundle branch block)    Migraine    OA (osteoarthritis)    Osteopenia 2005   Ulcer 2003   bleeding stomach ulcer    Past Surgical History:  Procedure Laterality Date   [place] N/A 01/20/2019   Procedure: DIAGNOSTIC LAP WITH SMALL BOWEL RESECTION;  Surgeon: Kieth Brightly Arta Bruce, MD;  Location: [facility];  Service: General;  Laterality: N/A;   [place]   okay, mild changes   [place]  2003   DEXA N/A 2005   borderline osteopenia   DIAGNOSTIC LAPAROSCOPY  11/25/2018   LAPAROSCOPY N/A 11/25/2018   Procedure: LAPAROSCOPY DIAGNOSTIC;  Surgeon: Mickeal Skinner, MD;  Location: [facility];  Service: General;  Laterality: N/A;   [place] ENDOSCOPY  2003    MEDICATIONS:  Prior to Admission medications   Medication Sig Start Date End Date Taking? Authorizing Provider  baclofen (LIORESAL) 10 MG tablet Take 10 mg by mouth daily as needed. For headache    [provider]  carbamide peroxide (DEBROX) 6.5 % OTIC solution Place 5 drops into the right ear daily as needed (for wax build up).    [provider]  clorazepate (TRANXENE) 7.5 MG tablet Take 1 tablet (7.5 mg total) by mouth 2 (two) times daily as needed for anxiety. 11/29/21   Venia Carbon, MD  donepezil (ARICEPT) 5 MG tablet Take 1 tablet (5 mg total) by mouth at bedtime. 12/17/21   Venia Carbon, MD  HYDROcodone bit-homatropine (HYCODAN) 5-1.5 MG/5ML syrup Take 5 mLs by mouth at bedtime as needed for cough. 12/17/21   Venia Carbon, MD  Multiple Vitamin (MULTIVITAMIN) tablet Take 1 tablet by mouth daily.    [provider]  polycarbophil (FIBERCON) 625 MG tablet Take 1 tablet (625 mg total) by mouth 2 (two) times daily as needed. 02/06/20   Tonia Ghent, MD  Potassium Gluconate 550 MG TABS Take 550 mg by mouth 3  (three) times a week.    [provider]  Probiotic Product (PROBIOTIC PO) Take 1 capsule by mouth daily.    [provider]  triamcinolone ointment (KENALOG) 0.1 % Apply to affected area 3x/week, increasing to daily with flares. 05/01/21   Brendolyn Patty, MD  vitamin B-12 (CYANOCOBALAMIN) 1000 MCG tablet Take 1,000 mcg by mouth daily.    [provider]  zonisamide (ZONEGRAN) 100 MG capsule Take 100 mg by mouth daily.    [provider]    Physical Exam   Triage Vital Signs: ED Triage Vitals  Enc Vitals Group     BP 01/15/22 2250 (!) 226/96     Pulse Rate 01/15/22 2250 78     Resp 01/15/22 2250 18     Temp 01/15/22 2250 97.8 F (36.6 C)     Temp Source 01/15/22 2250 Oral     SpO2 01/15/22 2250 97 %     Weight 01/15/22 2251 120 lb (54.4 kg)     Height 01/15/22 2251 5' 2"  (1.575 m)     Head Circumference --      Peak Flow --      Pain Score 01/15/22 2251 3     Pain Loc --      Pain Edu? --      Excl. in [place]? --     Most recent vital signs: Vitals:   01/16/22 0603 01/16/22 0630  BP: (!) 155/74 139/71  Pulse: (!) 52 63  Resp: 18 17  Temp:    SpO2: 98% 99%    CONSTITUTIONAL: Alert and oriented and responds appropriately to questions. Well-appearing; well-nourished, elderly, hard of hearing HEAD: Normocephalic, atraumatic EYES: Conjunctivae clear, pupils appear equal, sclera nonicteric ENT: normal nose; moist mucous membranes NECK: Supple, normal ROM CARD: RRR; S1 and S2 appreciated; no murmurs, no clicks, no rubs, no gallops RESP: Normal chest excursion without splinting or tachypnea; breath sounds clear and equal bilaterally; no wheezes, no rhonchi, no rales, no hypoxia or respiratory distress, speaking full sentences ABD/GI: Normal bowel sounds; non-distended; soft, non-tender, no rebound, no guarding, no peritoneal signs BACK: The back appears normal and is nontender to palpation EXT: Normal ROM in all joints; no deformity noted, no  edema; no cyanosis, 2+ radial pulses bilaterally SKIN: Normal color for age and race; warm; no rash on exposed skin NEURO: Moves all extremities equally, normal speech, global weakness on exam in all 4 extremities but no asymmetry noted, no drift, sensation to light touch intact diffusely, cranial nerves II through XII intact PSYCH: The patient's mood and manner are appropriate.   ED Results / Procedures / Treatments   LABS: (all labs ordered are listed, but only abnormal results are displayed) Labs Reviewed  COMPREHENSIVE METABOLIC PANEL - Abnormal; Notable for the following components:      Result Value   Glucose, Bld 105 (*)    Calcium 8.8 (*)    All other components within normal limits  RESP PANEL BY RT-PCR (FLU A&B, COVID) ARPGX2  PROTIME-INR  APTT  CBC  DIFFERENTIAL  MAGNESIUM  CK  URINALYSIS, ROUTINE W REFLEX MICROSCOPIC  TROPONIN I (HIGH SENSITIVITY)  TROPONIN I (HIGH SENSITIVITY)     EKG:  EKG Interpretation  Date/Time:  Wednesday January 15 2022 22:52:45 EST Ventricular Rate:  76 PR Interval:  158 QRS Duration: 126 QT Interval:  398 QTC Calculation: 447 R Axis:   43 Text Interpretation: Normal sinus rhythm Left bundle branch block Abnormal ECG When compared with ECG of 22-Nov-2018 12:54, Left bundle branch block is now Present Minimal criteria for Septal infarct are no longer Present Confirmed by Pryor Curia [phone]) on 01/15/2022 11:27:58 PM         RADIOLOGY: My personal review and interpretation of imaging: CT scan showed no dissection, LVO, intracranial hemorrhage.  Chest x-ray clear.  I have personally reviewed all radiology reports.   CT ANGIO HEAD NECK W WO CM  Result Date: 01/16/2022 CLINICAL DATA:  Initial evaluation for neuro deficit, stroke suspected. EXAM: CT ANGIOGRAPHY HEAD AND NECK TECHNIQUE: Multidetector CT imaging of the head and neck was performed using the standard protocol during bolus administration of intravenous contrast.  Multiplanar CT image reconstructions and MIPs were obtained to evaluate the vascular anatomy. Carotid stenosis measurements (when applicable) are obtained utilizing NASCET criteria, using the distal internal carotid diameter as the denominator. RADIATION DOSE REDUCTION: This exam was performed according to the departmental dose-optimization program which includes automated exposure control, adjustment of the mA and/or kV according to patient size and/or use of iterative reconstruction technique. CONTRAST:  116m OMNIPAQUE IOHEXOL 350 MG/ML SOLN COMPARISON:  Prior CT from 01/15/2022. FINDINGS: CTA NECK FINDINGS Aortic arch: Visualized aortic arch normal in caliber with normal 3 vessel morphology. No stenosis about the origin the great vessels. Right carotid system: Right common and internal carotid arteries patent without significant stenosis or dissection. Multifocal irregularity involving the mid-distal cervical right ICA suggestive of possible FMD. Left carotid system: Left common and internal carotid arteries patent without stenosis or dissection. 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 Vertebral arteries: Both vertebral arteries arise from the subclavian arteries. No proximal subclavian artery stenosis. Both vertebral arteries widely patent without stenosis, dissection or occlusion. Skeleton: No discrete or worrisome osseous lesions. Other neck: No other acute soft tissue abnormality within the neck. Tiny 3 mm left thyroid nodule noted, of doubtful significance given size and patient age, no follow-up imaging recommended (ref: J Am Coll Radiol. 2015 Feb;12(2): 143-50). Upper chest: Visualized upper chest demonstrates no acute finding. Review of the MIP images confirms the above findings CTA HEAD FINDINGS Anterior circulation: Both internal carotid arteries widely patent to the termini without stenosis. 4 mm outpouching arising from the cavernous left ICA  consistent with an aneurysm (series 8, image 249). A1 segments patent bilaterally. Right A1 hypoplastic. Normal anterior communicating artery complex. Anterior cerebral arteries patent without stenosis. No M1 stenosis or occlusion. Normal MCA bifurcations. Distal MCA branches perfused and symmetric. Posterior circulation: Left vertebral artery dominant and widely patent to the vertebrobasilar junction. Right vertebral artery largely terminates in PICA, although a tiny branch ascending towards the vertebrobasilar junction as well. Both PICA patent. Basilar patent to its distal aspect without stenosis.  Superior cerebellar arteries patent bilaterally. Left PCA supplied via the basilar. Fetal type origin right PCA. Both PCAs well perfused or distal aspects. Venous sinuses: Patent allowing for timing the contrast bolus. Anatomic variants: As above. Review of the MIP images confirms the above findings IMPRESSION: 1. Negative CTA for large vessel occlusion or other acute vascular abnormality. No hemodynamically significant or correctable stenosis. 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 3. Multifocal irregularity involving the mid-distal cervical ICAs bilaterally, suggesting mild FMD. Electronically Signed   By: Jeannine Boga M.D.   On: 01/16/2022 01:50   CT HEAD WO CONTRAST  Result Date: 01/15/2022 CLINICAL DATA:  Right arm numbness. Neuro deficit, acute, stroke suspected EXAM: CT HEAD WITHOUT CONTRAST TECHNIQUE: Contiguous axial images were obtained from the base of the skull through the vertex without intravenous contrast. RADIATION DOSE REDUCTION: This exam was performed according to the departmental dose-optimization program which includes automated exposure control, adjustment of the mA and/or kV according to patient size and/or use of iterative reconstruction technique. COMPARISON:  02/09/2016 FINDINGS: Brain: No acute intracranial abnormality. Specifically, no hemorrhage, hydrocephalus, mass lesion, acute  infarction, or significant intracranial injury. Vascular: No hyperdense vessel or unexpected calcification. Skull: No acute calvarial abnormality. Sinuses/Orbits: No acute findings Other: None IMPRESSION: Normal study. Electronically Signed   By: Rolm Baptise M.D.   On: 01/15/2022 23:36   DG Chest Portable 1 View  Result Date: 01/15/2022 CLINICAL DATA:  Hypertension, right arm pain, back pain EXAM: PORTABLE CHEST 1 VIEW COMPARISON:  None. FINDINGS: The heart size and mediastinal contours are within normal limits. Both lungs are clear. The visualized skeletal structures are unremarkable. IMPRESSION: No active disease. Electronically Signed   By: Fidela Salisbury M.D.   On: 01/15/2022 23:40   CT Angio Chest/Abd/Pel for Dissection W and/or Wo Contrast  Result Date: 01/16/2022 CLINICAL DATA:  Acute aortic syndrome suspected. Right arm numbness and pain tonight. Headache. Chest pain. EXAM: CT ANGIOGRAPHY CHEST, ABDOMEN AND PELVIS TECHNIQUE: Non-contrast CT of the chest was initially obtained. Multidetector CT imaging through the chest, abdomen and pelvis was performed using the standard protocol during bolus administration of intravenous contrast. Multiplanar reconstructed images and MIPs were obtained and reviewed to evaluate the vascular anatomy. RADIATION DOSE REDUCTION: This exam was performed according to the departmental dose-optimization program which includes automated exposure control, adjustment of the mA and/or kV according to patient size and/or use of iterative reconstruction technique. CONTRAST:  160m OMNIPAQUE IOHEXOL 350 MG/ML SOLN COMPARISON:  CT abdomen and pelvis 08/19/2018 FINDINGS: CTA CHEST FINDINGS Cardiovascular: Unenhanced images of the chest demonstrate calcification in the aorta and coronary arteries. No evidence of intramural hematoma. Images obtained during the arterial phase after contrast administration demonstrate normal caliber thoracic aorta with wide patency. No evidence of aortic  dissection. Great vessel origins are patent. Normal heart size. No pericardial effusions. Moderately good opacification of the central pulmonary arteries without evidence of significant pulmonary embolus. Mediastinum/Nodes: Thyroid gland is unremarkable. Esophagus is decompressed. Scattered lymph nodes are not pathologically enlarged, likely reactive. Lungs/Pleura: Lungs are clear. No pleural effusions. No pneumothorax. Musculoskeletal: No chest wall abnormality. No acute or significant osseous findings. Review of the MIP images confirms the above findings. CTA ABDOMEN AND PELVIS FINDINGS VASCULAR Aorta: Normal caliber aorta without aneurysm, dissection, vasculitis or significant stenosis. Scattered aortic calcification and noncalcific plaque formation. Celiac: Patent without evidence of aneurysm, dissection, vasculitis or significant stenosis. SMA: Patent without evidence of aneurysm, dissection, vasculitis or significant stenosis. Renals: Both renal arteries are patent without  evidence of aneurysm, dissection, vasculitis, fibromuscular dysplasia or significant stenosis. IMA: Patent without evidence of aneurysm, dissection, vasculitis or significant stenosis. Inflow: Patent without evidence of aneurysm, dissection, vasculitis or significant stenosis. Veins: No obvious venous abnormality within the limitations of this arterial phase study. Review of the MIP images confirms the above findings. NON-VASCULAR Hepatobiliary: Scattered liver cysts with benign appearance. No solid nodules. Surgical absence of the gallbladder. Bile ducts are not dilated. Pancreas: Unremarkable. No pancreatic ductal dilatation or surrounding inflammatory changes. Spleen: Normal in size without focal abnormality. Adrenals/Urinary Tract: Adrenal glands are unremarkable. Kidneys are normal, without renal calculi, focal lesion, or hydronephrosis. Bladder is unremarkable. Stomach/Bowel: Stomach, small bowel, and colon are not abnormally  distended. Scattered stool in the colon. Appendix is not identified. Sigmoid colonic anastomosis. Lymphatic: No significant lymphadenopathy. Reproductive: Status post hysterectomy. No adnexal masses. Other: No abdominal wall hernia or abnormality. No abdominopelvic ascites. Musculoskeletal: No acute or significant osseous findings. Review of the MIP images confirms the above findings. IMPRESSION: 1. No evidence of aneurysm, dissection, or significant stenosis of the thoracic or abdominal aorta or major branch vessels. 2. No evidence of central pulmonary embolus. 3. No evidence of active pulmonary disease. 4. Aortic atherosclerosis. 5. No acute process demonstrated in the abdomen or pelvis. Electronically Signed   By: Lucienne Capers M.D.   On: 01/16/2022 01:07     PROCEDURES:  Critical Care performed: Yes, see critical care procedure note(s)   CRITICAL CARE Performed by: Cyril Mourning Crystian Frith   Total critical care time: 45 minutes  Critical care time was exclusive of separately billable procedures and treating other patients.  Critical care was necessary to treat or prevent imminent or life-threatening deterioration.  Critical care was time spent personally by me on the following activities: development of treatment plan with patient and/or surrogate as well as nursing, discussions with consultants, evaluation of patient's response to treatment, examination of patient, obtaining history from patient or surrogate, ordering and performing treatments and interventions, ordering and review of laboratory studies, ordering and review of radiographic studies, pulse oximetry and re-evaluation of patient's condition.   Marland Kitchen1-3 Lead EKG Interpretation Performed by: Jalisia Puchalski, Delice Bison, DO Authorized by: Railee Bonillas, Delice Bison, DO     Interpretation: normal     ECG rate:  79   ECG rate assessment: normal     Rhythm: sinus rhythm     Ectopy: none     Conduction: normal      IMPRESSION / MDM / ASSESSMENT AND PLAN /  ED COURSE  I reviewed the triage vital signs and the nursing notes.    Patient here with complaints of right arm pain with cramping, numbness and left upper back pain with extreme hypertension.  The patient is on the cardiac monitor to evaluate for evidence of arrhythmia and/or significant heart rate changes.   DIFFERENTIAL DIAGNOSIS (includes but not limited to):   CVA, TIA, intracranial hemorrhage, dissection, ACS, hypertensive urgency/emergency, electrolyte derangement, rhabdomyolysis, myositis, musculoskeletal pain from strain/sprain/spasm   PLAN: We will obtain labs including CBC, electrolytes, troponin, CK.  Patient's symptoms concerning for CVA but NIH stroke scale now is 0 and she would not be a tPA candidate.  We will start with a CT of her head.  If creatinine is normal, I feel she will need a CTA of her head and neck as well as a CT dissection study.  Will give labetalol for her hypertension but at this time allow for permissive hypertension until I have imaging back.   MEDICATIONS  GIVEN IN ED: Medications  labetalol (NORMODYNE) injection 10 mg (10 mg Intravenous Given 01/15/22 2344)  iohexol (OMNIPAQUE) 350 MG/ML injection 125 mL (125 mLs Intravenous Contrast Given 01/16/22 0052)     ED COURSE: Patient's labs show normal hemoglobin, normal electrolytes, troponin negative x2, normal CK, COVID and flu negative.  CT scans reviewed by myself and radiologist showed no dissection or large vessel occlusion.  She does have a small 4 mm left ICA aneurysm.  Blood pressures have improved with IV hydralazine.  I am concerned that this was hypertensive urgency/emergency versus TIA.  We will order an MRI of her brain and admit to the hospitalist.   CONSULTS:  5:58 AM  Consulted and discussed patient's case with hospitalist, Dr. Sidney Ace.  I have recommended admission and consulting physician agrees and will place admission orders.  Patient (and family if present) agree with this plan.   I reviewed  all nursing notes, vitals, pertinent previous records.  All labs, EKGs, imaging ordered have been independently reviewed and interpreted by myself.    OUTSIDE RECORDS REVIEWED: Reviewed patient's podiatry note from 08/05/2016.         FINAL CLINICAL IMPRESSION(S) / ED DIAGNOSES   Final diagnoses:  Right arm pain  Right arm numbness  Hypertension, unspecified type     Rx / DC Orders   ED Discharge Orders     None        Note:  This document was prepared using Dragon voice recognition software and may include unintentional dictation errors.   Maresa Morash, Delice Bison, DO 01/16/22 [phone]

## 2022-01-15 NOTE — ED Triage Notes (Signed)
Pt reports right arm numbness and pain at [place] while watching tv.  Pt has a headache.  Hx bells palsy and migraine h/a  pt alert speech clear. Pt also has chest pain.

## 2022-01-15 NOTE — ED Notes (Signed)
Pt updated on need for urine sample. Pt denies any need to go at this time. Will let me know when they are able to do so.

## 2022-01-15 NOTE — ED Notes (Signed)
X-ray at bedside

## 2022-01-16 ENCOUNTER — Observation Stay: Payer: Medicare HMO

## 2022-01-16 ENCOUNTER — Emergency Department: Payer: Medicare HMO

## 2022-01-16 ENCOUNTER — Encounter: Payer: Self-pay | Admitting: Radiology

## 2022-01-16 ENCOUNTER — Observation Stay (HOSPITAL_BASED_OUTPATIENT_CLINIC_OR_DEPARTMENT_OTHER)
Admit: 2022-01-16 | Discharge: 2022-01-16 | Disposition: A | Discharge status: Home / Self Care | Payer: Medicare HMO | Attending: Internal Medicine | Admitting: Internal Medicine

## 2022-01-16 DIAGNOSIS — G459 Transient cerebral ischemic attack, unspecified: Principal | ICD-10-CM

## 2022-01-16 DIAGNOSIS — K6389 Other specified diseases of intestine: Secondary | ICD-10-CM | POA: Diagnosis not present

## 2022-01-16 DIAGNOSIS — K7689 Other specified diseases of liver: Secondary | ICD-10-CM | POA: Diagnosis not present

## 2022-01-16 DIAGNOSIS — Q283 Other malformations of cerebral vessels: Secondary | ICD-10-CM | POA: Diagnosis not present

## 2022-01-16 DIAGNOSIS — R29818 Other symptoms and signs involving the nervous system: Secondary | ICD-10-CM | POA: Diagnosis not present

## 2022-01-16 DIAGNOSIS — E041 Nontoxic single thyroid nodule: Secondary | ICD-10-CM | POA: Diagnosis not present

## 2022-01-16 DIAGNOSIS — M79601 Pain in right arm: Secondary | ICD-10-CM | POA: Diagnosis not present

## 2022-01-16 DIAGNOSIS — I700 Atherosclerosis of aorta: Secondary | ICD-10-CM | POA: Diagnosis not present

## 2022-01-16 DIAGNOSIS — R519 Headache, unspecified: Secondary | ICD-10-CM | POA: Diagnosis not present

## 2022-01-16 DIAGNOSIS — Z8673 Personal history of transient ischemic attack (TIA), and cerebral infarction without residual deficits: Secondary | ICD-10-CM | POA: Diagnosis present

## 2022-01-16 DIAGNOSIS — I2510 Atherosclerotic heart disease of native coronary artery without angina pectoris: Secondary | ICD-10-CM | POA: Diagnosis not present

## 2022-01-16 DIAGNOSIS — R200 Anesthesia of skin: Secondary | ICD-10-CM | POA: Diagnosis not present

## 2022-01-16 DIAGNOSIS — J3489 Other specified disorders of nose and nasal sinuses: Secondary | ICD-10-CM | POA: Diagnosis not present

## 2022-01-16 LAB — POTASSIUM: Potassium: 3.8 mmol/L (ref 3.5–5.1)

## 2022-01-16 LAB — URINALYSIS, ROUTINE W REFLEX MICROSCOPIC
Bilirubin Urine: NEGATIVE
Glucose, UA: NEGATIVE mg/dL
Hgb urine dipstick: NEGATIVE
Ketones, ur: NEGATIVE mg/dL
Leukocytes,Ua: NEGATIVE
Nitrite: NEGATIVE
Protein, ur: NEGATIVE mg/dL
Specific Gravity, Urine: 1.033 — ABNORMAL HIGH (ref 1.005–1.030)
pH: 8 (ref 5.0–8.0)

## 2022-01-16 LAB — ECHOCARDIOGRAM COMPLETE
AR max vel: 1.66 cm2
AV Area VTI: 1.53 cm2
AV Area mean vel: 1.64 cm2
AV Mean grad: 5 mmHg
AV Peak grad: 8 mmHg
Ao pk vel: 1.41 m/s
Area-P 1/2: 4.74 cm2
Height: 62 in
MV VTI: 1.79 cm2
S' Lateral: 2.02 cm
Weight: 1920 oz

## 2022-01-16 LAB — LIPID PANEL
Cholesterol: 225 mg/dL — ABNORMAL HIGH (ref 0–200)
HDL: 55 mg/dL (ref >40)
LDL Cholesterol: 146 mg/dL — ABNORMAL HIGH (ref 0–99)
Total CHOL/HDL Ratio: 4.1 RATIO
Triglycerides: 121 mg/dL (ref <150)
VLDL: 24 mg/dL (ref 0–40)

## 2022-01-16 LAB — COMPREHENSIVE METABOLIC PANEL
ALT: 17 U/L (ref 0–44)
AST: 23 U/L (ref 15–41)
Albumin: 4.3 g/dL (ref 3.5–5.0)
Alkaline Phosphatase: 70 U/L (ref 38–126)
Anion gap: 6 (ref 5–15)
BUN: 10 mg/dL (ref 8–23)
CO2: 24 mmol/L (ref 22–32)
Calcium: 8.8 mg/dL — ABNORMAL LOW (ref 8.9–10.3)
Chloride: 105 mmol/L (ref 98–111)
Creatinine, Ser: 0.76 mg/dL (ref 0.44–1.00)
GFR, Estimated: >60 mL/min (ref >60)
Glucose, Bld: 105 mg/dL — ABNORMAL HIGH (ref 70–99)
Potassium: 3.5 mmol/L (ref 3.5–5.1)
Sodium: 135 mmol/L (ref 135–145)
Total Bilirubin: 0.5 mg/dL (ref 0.3–1.2)
Total Protein: 7.1 g/dL (ref 6.5–8.1)

## 2022-01-16 LAB — TROPONIN I (HIGH SENSITIVITY)
Troponin I (High Sensitivity): 7 ng/L (ref <18)
Troponin I (High Sensitivity): 10 ng/L (ref <18)

## 2022-01-16 LAB — MAGNESIUM: Magnesium: 2 mg/dL (ref 1.7–2.4)

## 2022-01-16 LAB — RESP PANEL BY RT-PCR (FLU A&B, COVID) ARPGX2
Influenza A by PCR: NEGATIVE
Influenza B by PCR: NEGATIVE
SARS Coronavirus 2 by RT PCR: NEGATIVE

## 2022-01-16 LAB — HEMOGLOBIN A1C
Hgb A1c MFr Bld: 5.2 % (ref 4.8–5.6)
Mean Plasma Glucose: 102.54 mg/dL

## 2022-01-16 IMAGING — CT CT ANGIO HEAD-NECK (W OR W/O PERF)
3 of 7 series · 15 of 47 positions shown · IV contrast (APPLIED)
Comparison: Prior CT from [DATE].

CLINICAL DATA: Initial evaluation for neuro deficit, stroke
suspected.

EXAM:
CT ANGIOGRAPHY HEAD AND NECK
TECHNIQUE: Multidetector CT imaging of the head and neck was performed using
the standard protocol during bolus administration of intravenous
contrast. Multiplanar CT image reconstructions and MIPs were
obtained to evaluate the vascular anatomy. Carotid stenosis
measurements (when applicable) are obtained utilizing NASCET
criteria, using the distal internal carotid diameter as the
denominator.

[Series 8: ax thin · axial · 0.51mm/px · z∈[-392,-101]mm · 9 of 343 slices shown]
[im 21/343  brain]
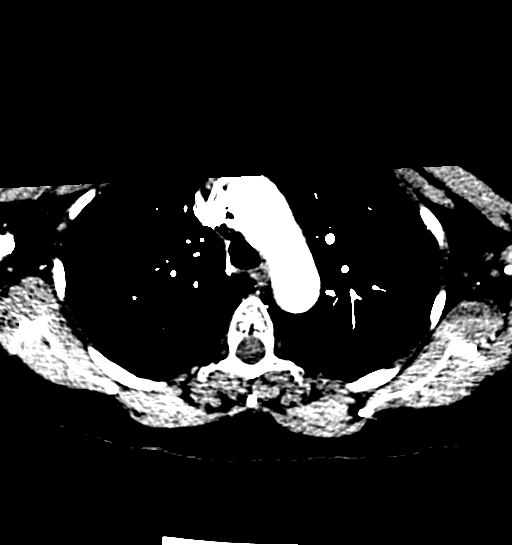
[im 61/343  bone]
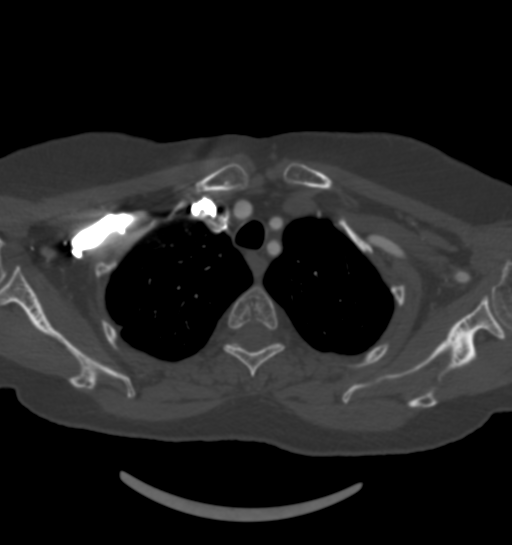
[im 101/343  brain]
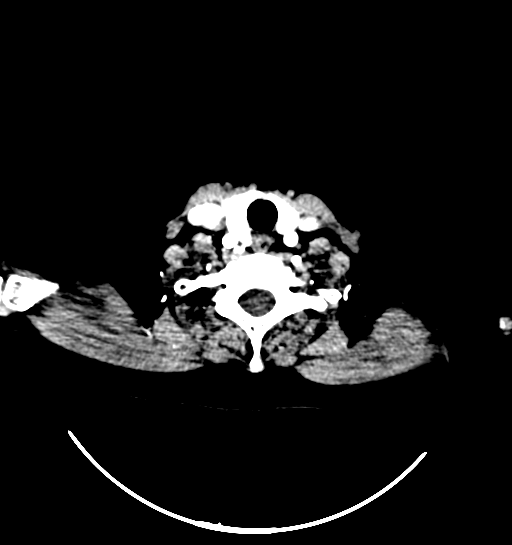
[im 141/343  bone]
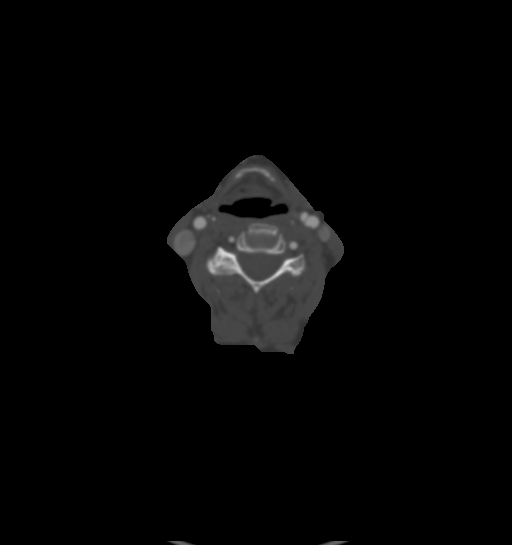
[im 182/343  brain]
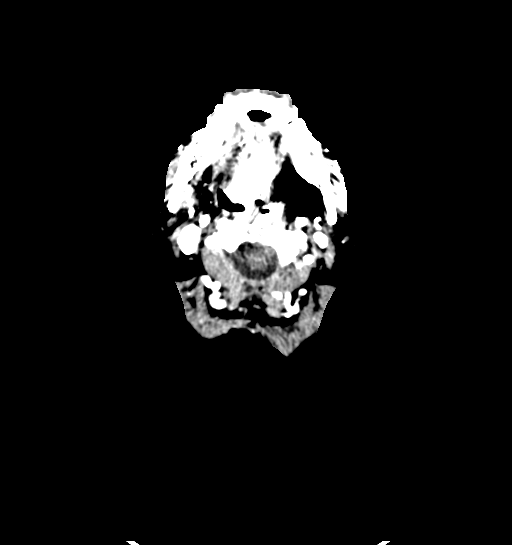
[im 202/343  bone]
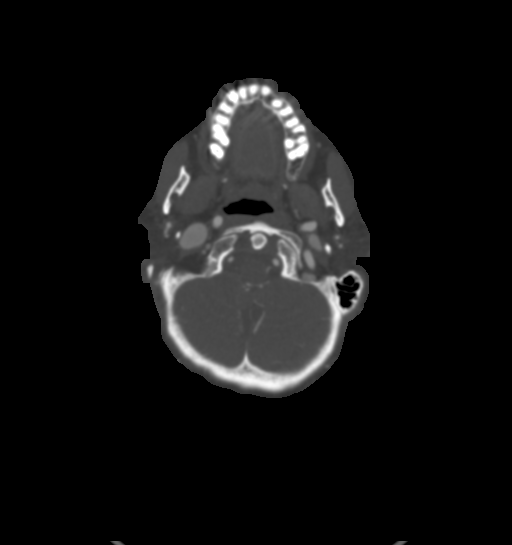
[im 242/343  brain]
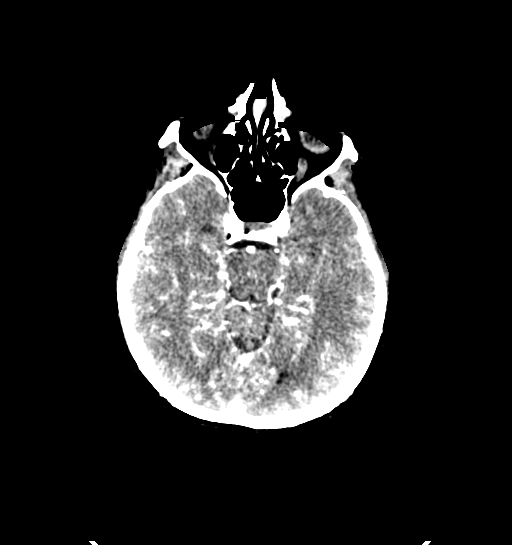
[im 282/343  bone]
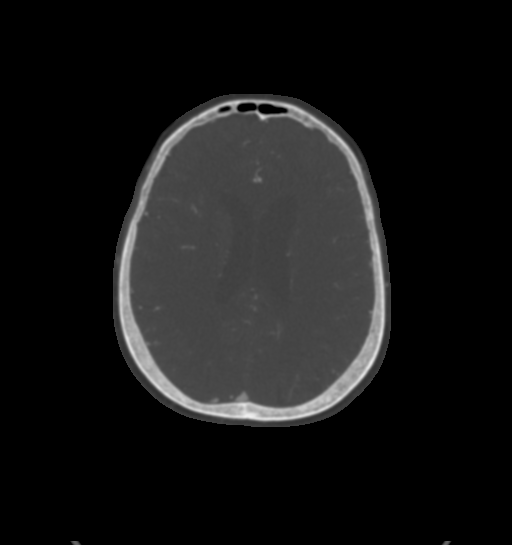
[im 322/343  brain]
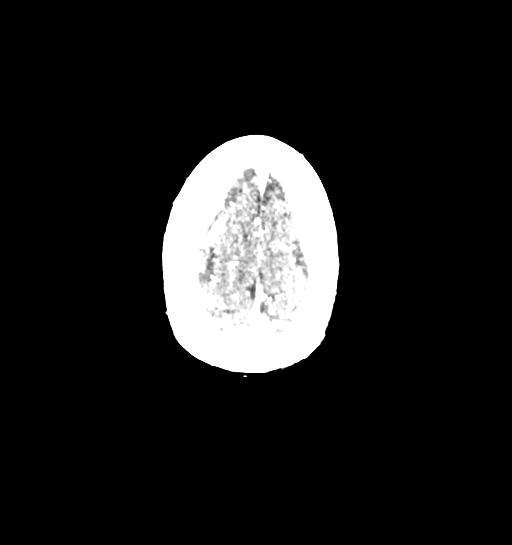

[Series 9: coronal thin · coronal · 0.51mm/px · 3 of 218 slices shown]
[im 63/218  brain]
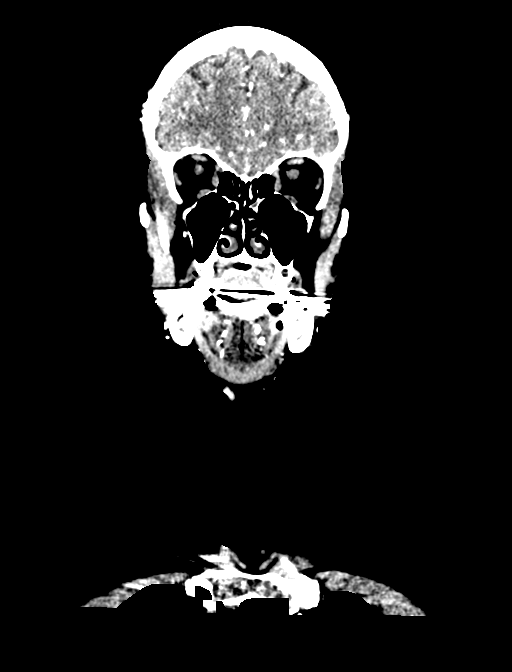
[im 94/218  brain]
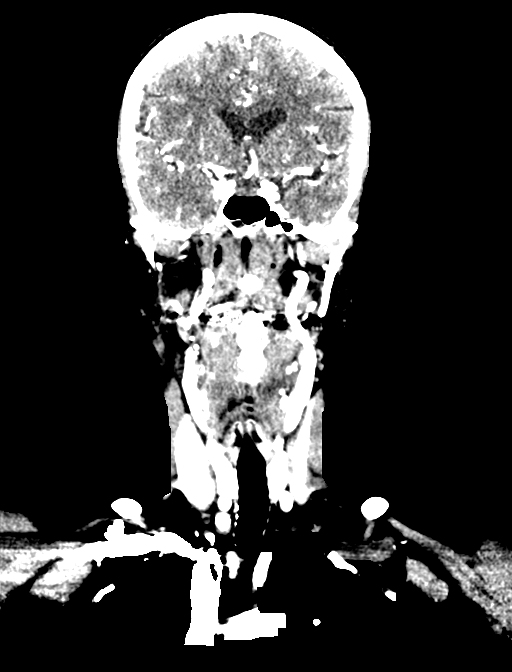
[im 125/218  brain]
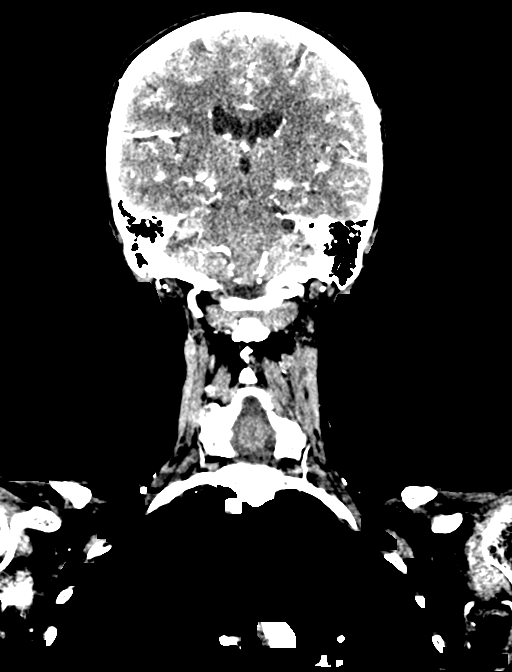

[Series 10: sagittal thin · sagittal · 0.45mm/px · 3 of 219 slices shown]
[im 44/219  brain]
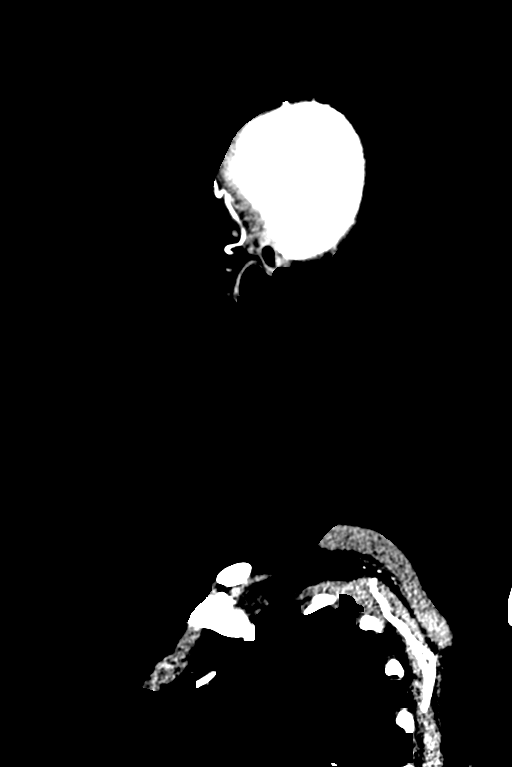
[im 88/219  brain]
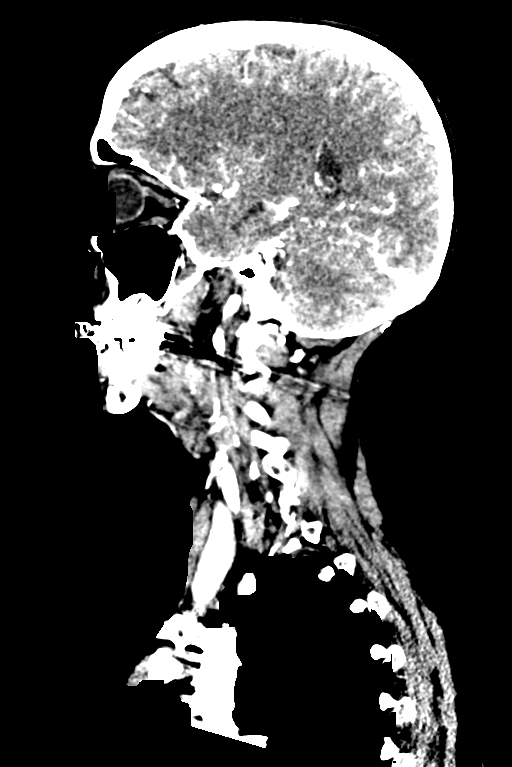
[im 131/219  brain]
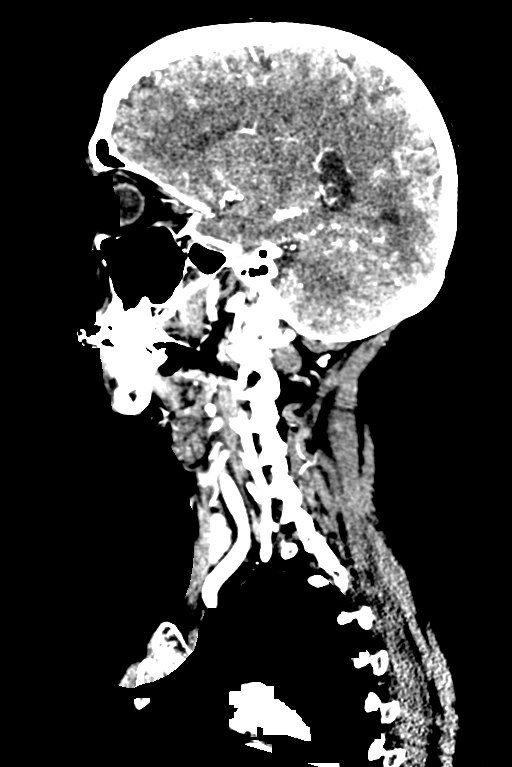

[15 of 47 positions shown; findings below may reference images not displayed]

RADIATION DOSE REDUCTION: This exam was performed according to the
departmental dose-optimization program which includes automated
exposure control, adjustment of the mA and/or kV according to
patient size and/or use of iterative reconstruction technique.

CONTRAST:  125mL OMNIPAQUE IOHEXOL 350 MG/ML SOLN
FINDINGS: CTA NECK FINDINGS

Aortic arch: Visualized aortic arch normal in caliber with normal 3
vessel morphology. No stenosis about the origin the great vessels.

Right carotid system: Right common and internal carotid arteries
patent without significant stenosis or dissection. Multifocal
irregularity involving the mid-distal cervical right ICA suggestive
of possible FMD.

Left carotid system: Left common and internal carotid arteries
patent without stenosis or dissection. Mild irregularity about the
mid-distal cervical left ICA suggestive of possible mild FMD.

Vertebral arteries: Both vertebral arteries arise from the
subclavian arteries. No proximal subclavian artery stenosis. Both
vertebral arteries widely patent without stenosis, dissection or
occlusion.

Skeleton: No discrete or worrisome osseous lesions.

Other neck: No other acute soft tissue abnormality within the neck.
Tiny 3 mm left thyroid nodule noted, of doubtful significance given
size and patient age, no follow-up imaging recommended (ref: [HOSPITAL]. [DATE]): 143-50).

Upper chest: Visualized upper chest demonstrates no acute finding.

Review of the MIP images confirms the above findings

CTA HEAD FINDINGS

Anterior circulation: Both internal carotid arteries widely patent
to the termini without stenosis. 4 mm outpouching arising from the
cavernous left ICA consistent with an aneurysm (series 8, image
249). A1 segments patent bilaterally. Right A1 hypoplastic. Normal
anterior communicating artery complex. Anterior cerebral arteries
patent without stenosis. No M1 stenosis or occlusion. Normal MCA
bifurcations. Distal MCA branches perfused and symmetric.

Posterior circulation: Left vertebral artery dominant and widely
patent to the vertebrobasilar junction. Right vertebral artery
largely terminates in PICA, although a tiny branch ascending towards
the vertebrobasilar junction as well. Both PICA patent. Basilar
patent to its distal aspect without stenosis. Superior cerebellar
arteries patent bilaterally. Left PCA supplied via the basilar.
Fetal type origin right PCA. Both PCAs well perfused or distal
aspects.

Venous sinuses: Patent allowing for timing the contrast bolus.

Anatomic variants: As above.

Review of the MIP images confirms the above findings
IMPRESSION: 1. Negative CTA for large vessel occlusion or other acute vascular
abnormality. No hemodynamically significant or correctable stenosis.
2. 4 mm cavernous left ICA aneurysm.
3. Multifocal irregularity involving the mid-distal cervical ICAs
bilaterally, suggesting mild FMD.

## 2022-01-16 IMAGING — CT CT ANGIO CHEST-ABD-PELV FOR DISSECTION W/ AND WO/W CM
3 of 10 series · 11 of 46 positions shown, 17 images · IV contrast (APPLIED)
Comparison: CT abdomen and pelvis [DATE]

CLINICAL DATA: Acute aortic syndrome suspected. Right arm numbness
and pain tonight. Headache. Chest pain.

EXAM:
CT ANGIOGRAPHY CHEST, ABDOMEN AND PELVIS
TECHNIQUE: Non-contrast CT of the chest was initially obtained.

[Series 3: axial st · axial · 0.71mm/px · z∈[-530,-484]mm · 2 of 162 slices shown]
[im 24/162  soft-tissue]
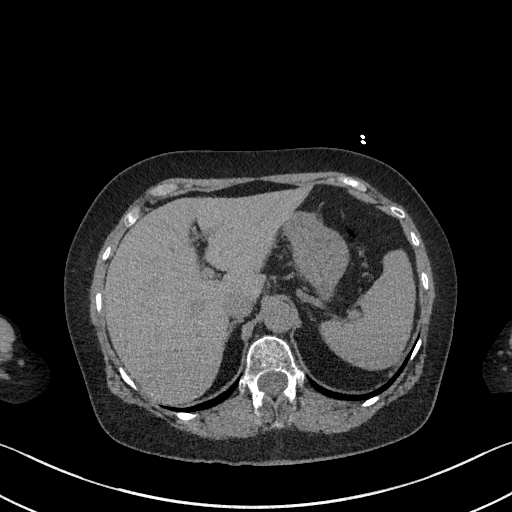
[im 47/162  soft-tissue]
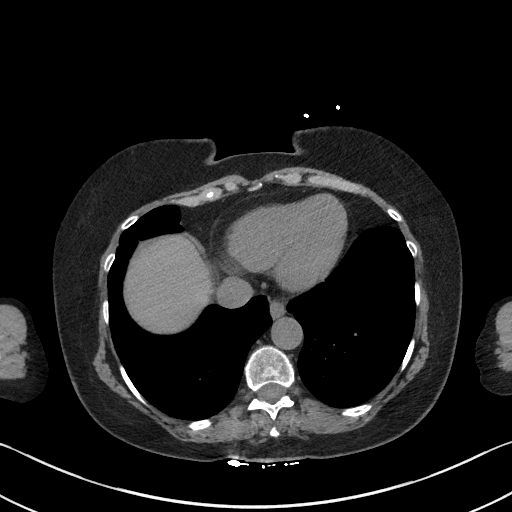

[Series 6: coronal · coronal · 0.63mm/px · 2 of 114 slices shown, 3 images]
[im 38/114  soft-tissue]
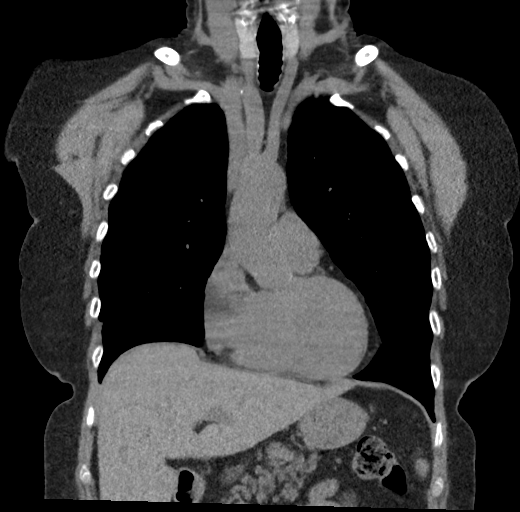
[im 38/114  bone]
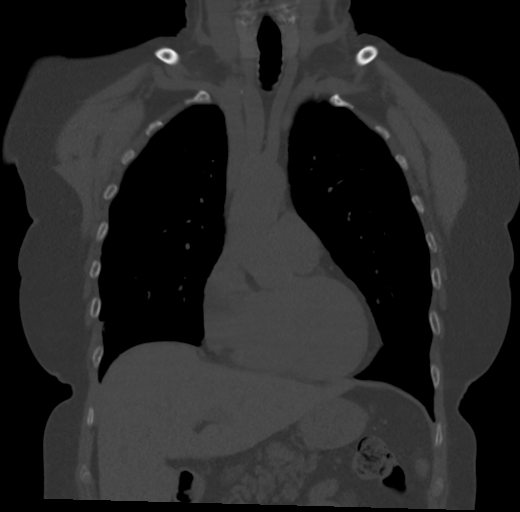
[im 76/114  soft-tissue]
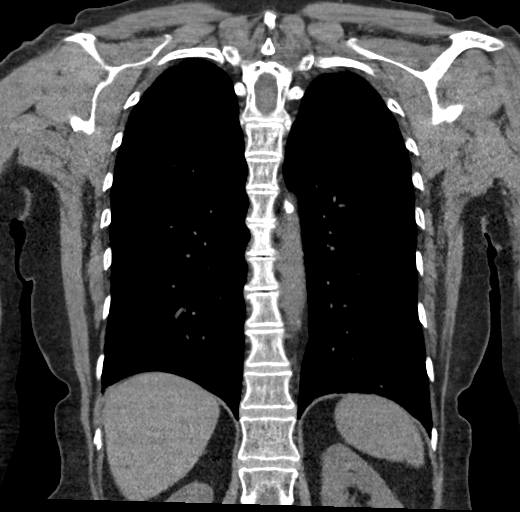

[Series 8: axial arterial · axial · arterial · 0.77mm/px · z∈[-807,-342]mm · 7 of 207 slices shown, 12 images]
[im 26/207  soft-tissue]
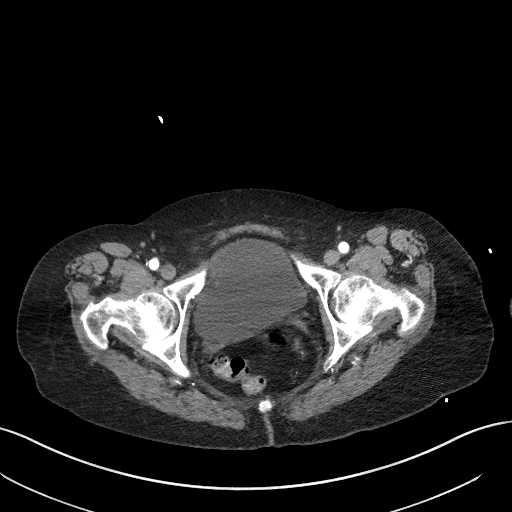
[im 26/207  bone]
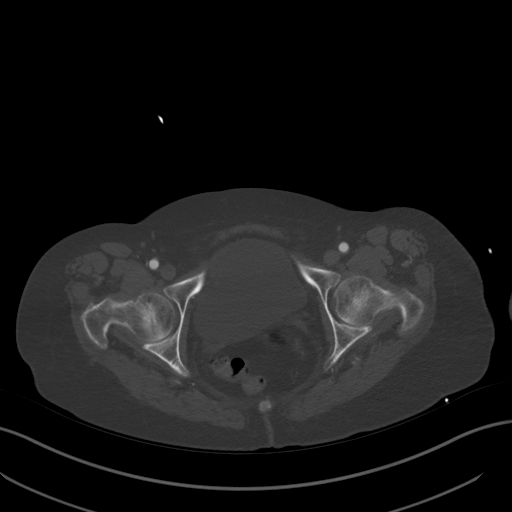
[im 52/207  soft-tissue]
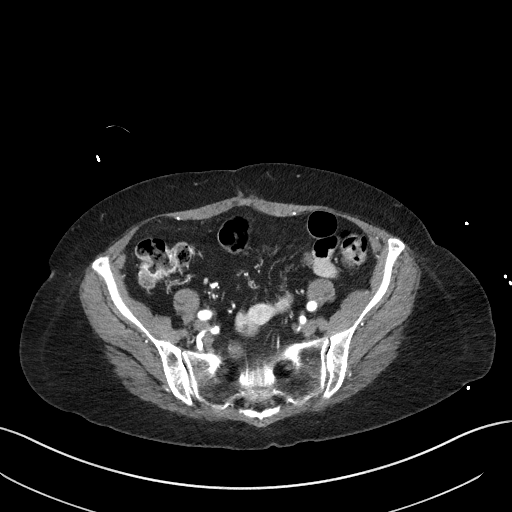
[im 78/207  soft-tissue]
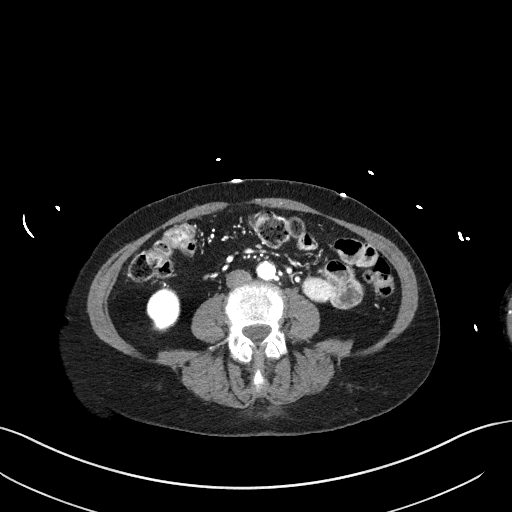
[im 104/207  soft-tissue]
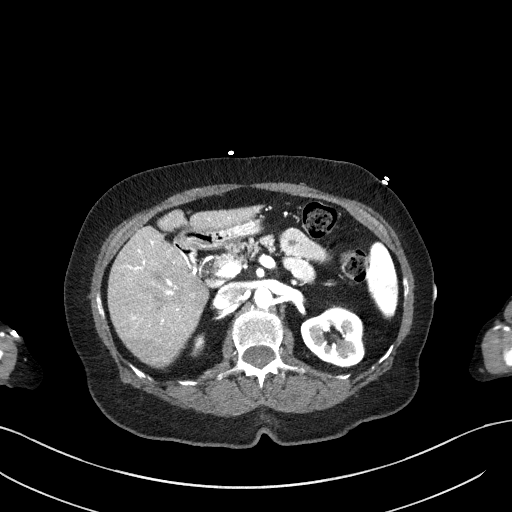
[im 104/207  lung]
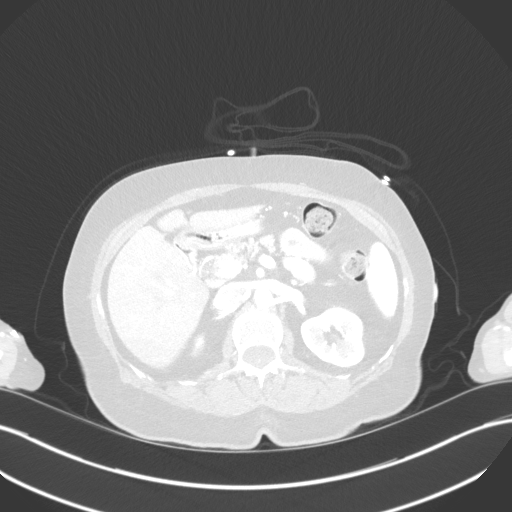
[im 129/207  soft-tissue]
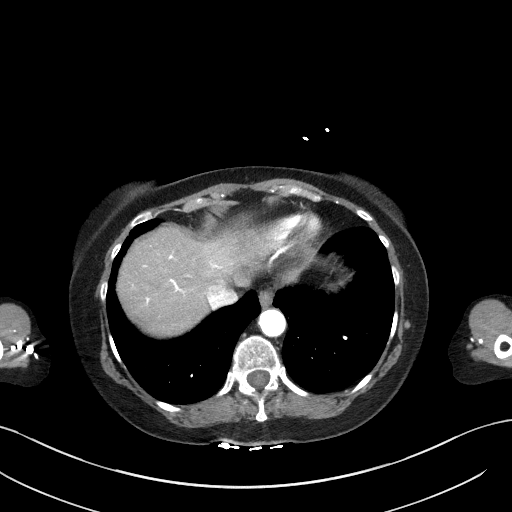
[im 129/207  lung]
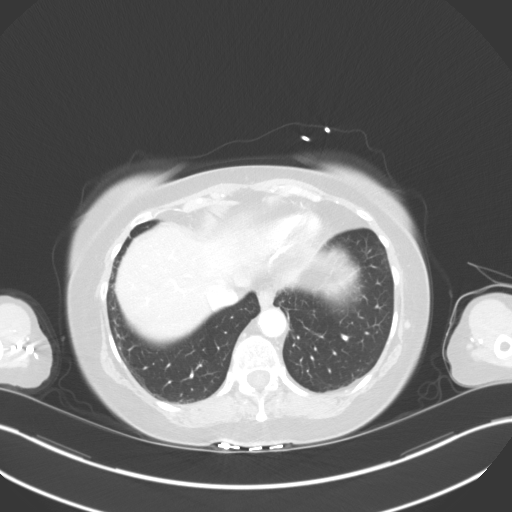
[im 155/207  soft-tissue]
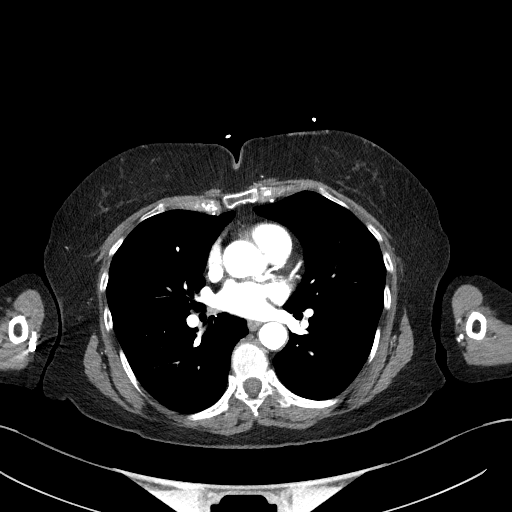
[im 155/207  lung]
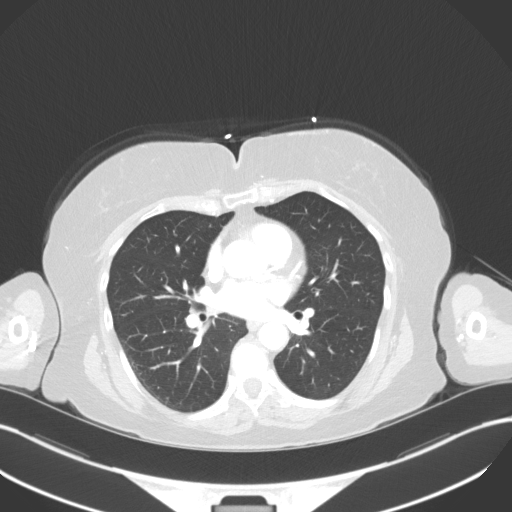
[im 181/207  soft-tissue]
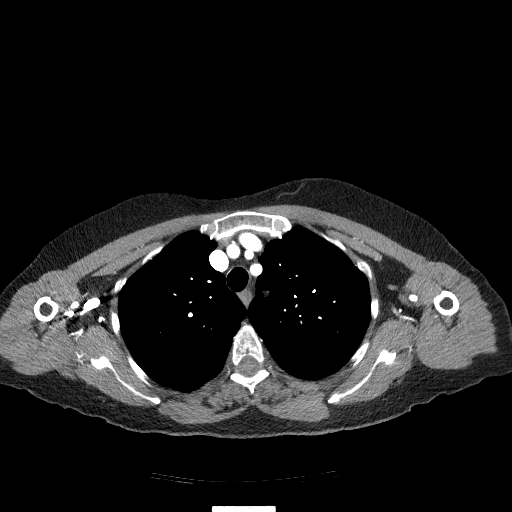
[im 181/207  lung]
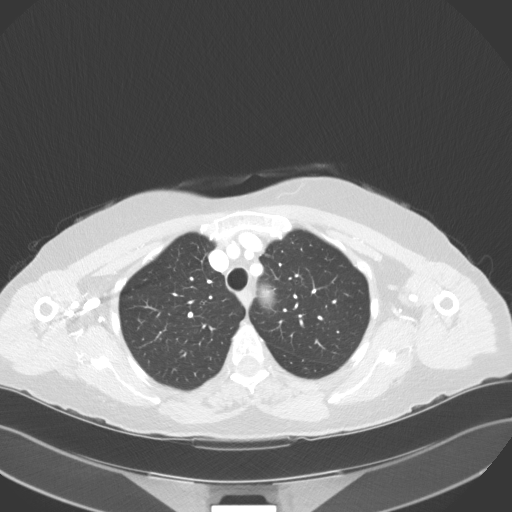

[11 of 46 positions shown; findings below may reference images not displayed]

Multidetector CT imaging through the chest, abdomen and pelvis was
performed using the standard protocol during bolus administration of
intravenous contrast. Multiplanar reconstructed images and MIPs were
obtained and reviewed to evaluate the vascular anatomy.

RADIATION DOSE REDUCTION: This exam was performed according to the
departmental dose-optimization program which includes automated
exposure control, adjustment of the mA and/or kV according to
patient size and/or use of iterative reconstruction technique.

CONTRAST:  125mL OMNIPAQUE IOHEXOL 350 MG/ML SOLN
FINDINGS: CTA CHEST FINDINGS

Cardiovascular: Unenhanced images of the chest demonstrate
calcification in the aorta and coronary arteries. No evidence of
intramural hematoma.

Images obtained during the arterial phase after contrast
administration demonstrate normal caliber thoracic aorta with wide
patency. No evidence of aortic dissection. Great vessel origins are
patent. Normal heart size. No pericardial effusions. Moderately good
opacification of the central pulmonary arteries without evidence of
significant pulmonary embolus.

Mediastinum/Nodes: Thyroid gland is unremarkable. Esophagus is
decompressed. Scattered lymph nodes are not pathologically enlarged,
likely reactive.

Lungs/Pleura: Lungs are clear. No pleural effusions. No
pneumothorax.

Musculoskeletal: No chest wall abnormality. No acute or significant
osseous findings.

Review of the MIP images confirms the above findings.

CTA ABDOMEN AND PELVIS FINDINGS

VASCULAR

Aorta: Normal caliber aorta without aneurysm, dissection, vasculitis
or significant stenosis. Scattered aortic calcification and
noncalcific plaque formation.

Celiac: Patent without evidence of aneurysm, dissection, vasculitis
or significant stenosis.

SMA: Patent without evidence of aneurysm, dissection, vasculitis or
significant stenosis.

Renals: Both renal arteries are patent without evidence of aneurysm,
dissection, vasculitis, fibromuscular dysplasia or significant
stenosis.

IMA: Patent without evidence of aneurysm, dissection, vasculitis or
significant stenosis.

Inflow: Patent without evidence of aneurysm, dissection, vasculitis
or significant stenosis.

Veins: No obvious venous abnormality within the limitations of this
arterial phase study.

Review of the MIP images confirms the above findings.

NON-VASCULAR

Hepatobiliary: Scattered liver cysts with benign appearance. No
solid nodules. Surgical absence of the gallbladder. Bile ducts are
not dilated.

Pancreas: Unremarkable. No pancreatic ductal dilatation or
surrounding inflammatory changes.

Spleen: Normal in size without focal abnormality.

Adrenals/Urinary Tract: Adrenal glands are unremarkable. Kidneys are
normal, without renal calculi, focal lesion, or hydronephrosis.
Bladder is unremarkable.

Stomach/Bowel: Stomach, small bowel, and colon are not abnormally
distended. Scattered stool in the colon. Appendix is not identified.
Sigmoid colonic anastomosis.

Lymphatic: No significant lymphadenopathy.

Reproductive: Status post hysterectomy. No adnexal masses.

Other: No abdominal wall hernia or abnormality. No abdominopelvic
ascites.

Musculoskeletal: No acute or significant osseous findings.

Review of the MIP images confirms the above findings.
IMPRESSION: 1. No evidence of aneurysm, dissection, or significant stenosis of
the thoracic or abdominal aorta or major branch vessels.
2. No evidence of central pulmonary embolus.
3. No evidence of active pulmonary disease.
4. Aortic atherosclerosis.
5. No acute process demonstrated in the abdomen or pelvis.

## 2022-01-16 IMAGING — MR MR HEAD W/O CM
13 series · 48 of 48 positions shown · non-contrast
Comparison: CT angiogram head/neck [DATE]. Head CT [DATE].
Brain MRI [DATE].

CLINICAL DATA: Transient ischemic attack. Additional history
provided: Right arm numbness and pain. Headache. History of Bell's
palsy and migraine. Chest pain.

EXAM:
MRI HEAD WITHOUT CONTRAST
TECHNIQUE: Multiplanar, multiecho pulse sequences of the brain and surrounding
structures were obtained without intravenous contrast.

[Series 5: ax dwi_tracew · axial · 3.0mm · 0.65mm/px · z∈[+20,+174]mm · 3 of 48 slices shown]
[im 1/48]
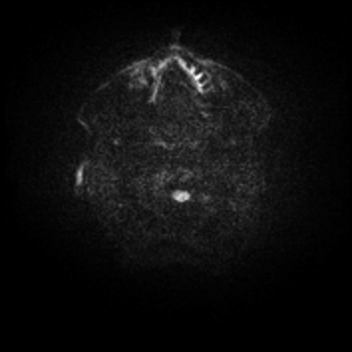
[im 24/48]
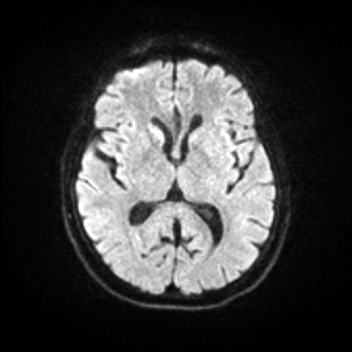
[im 48/48]
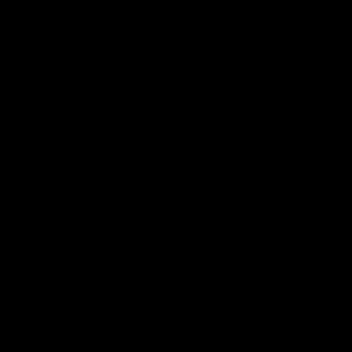

[Series 6: ax dwi_adc · axial · 3.0mm · 0.65mm/px · z∈[+20,+164]mm · 3 of 45 slices shown]
[im 1/45]
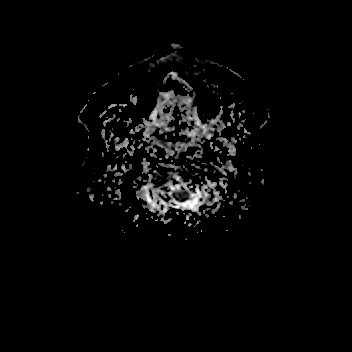
[im 23/45]
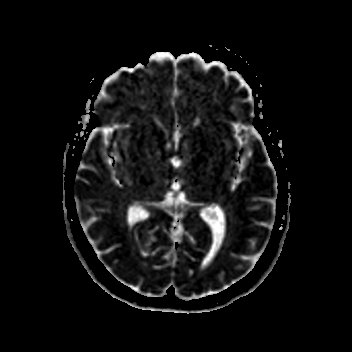
[im 45/45]
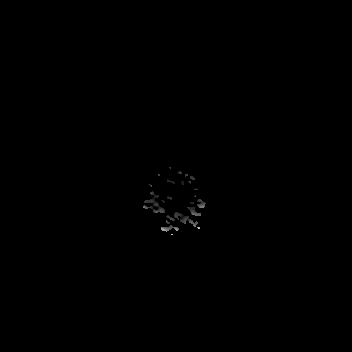

[Series 7: cor dwi_tracew · coronal · 5.0mm · 0.65mm/px · 3 of 36 slices shown]
[im 1/36]
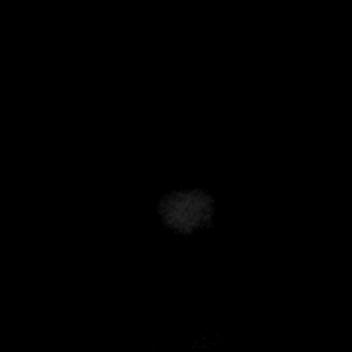
[im 18/36]
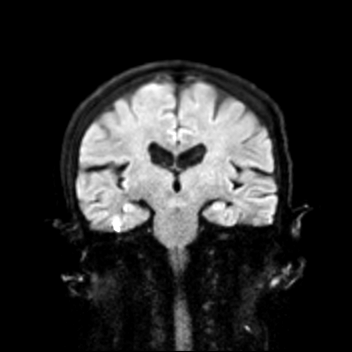
[im 36/36]
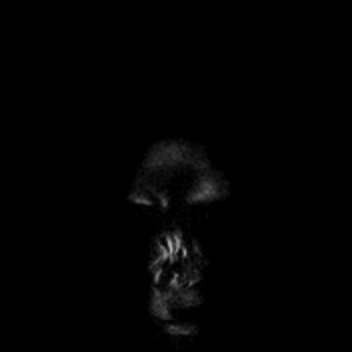

[Series 8: cor dwi_adc · coronal · 5.0mm · 0.65mm/px · 2 of 35 slices shown]
[im 1/35]
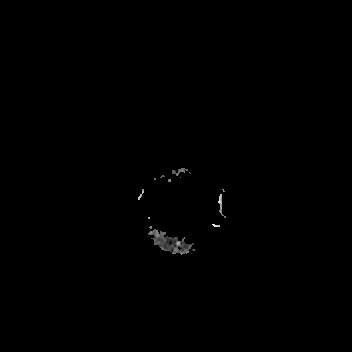
[im 35/35]
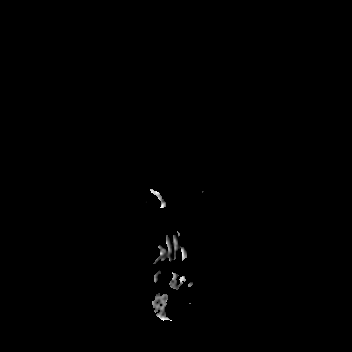

[Series 9: T1 · sagittal · 5.0mm · 0.62mm/px · 1 of 19 slices shown (1 of 2)]
[im 1/19]
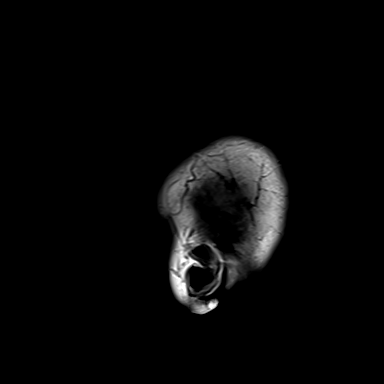

[Series 10: T2 · axial · 5.0mm · 0.53mm/px · z∈[+25,+167]mm · 2 of 25 slices shown (1 of 2)]
[im 1/25]
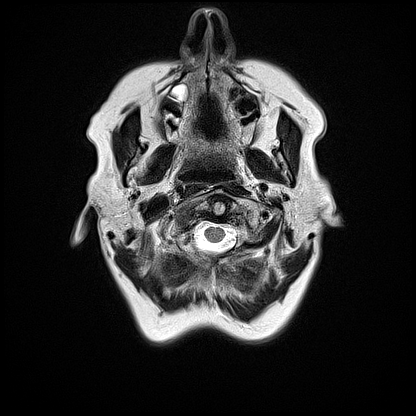
[im 25/25]
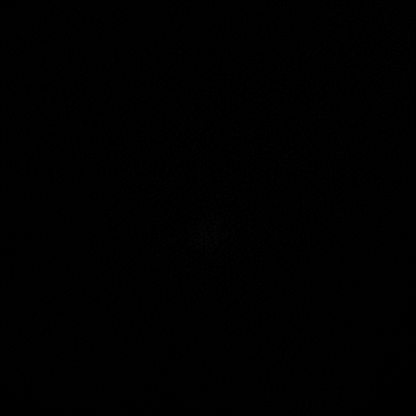

[Series 11: mag_images · axial · 3.0mm · 0.90mm/px · z∈[+9,+184]mm · 4 of 60 slices shown]
[im 1/60]
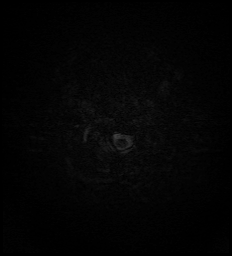
[im 20/60]
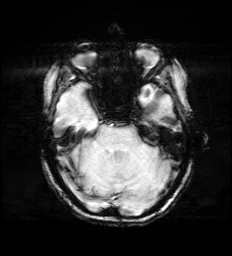
[im 40/60]
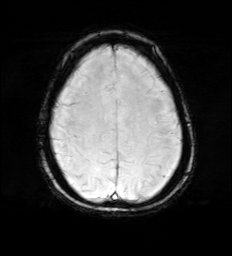
[im 60/60]
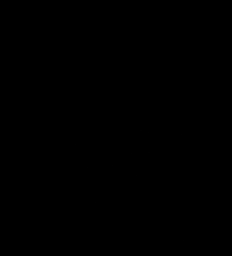

[Series 12: pha_images · axial · 3.0mm · 0.90mm/px · z∈[+12,+181]mm · 4 of 55 slices shown]
[im 1/55]
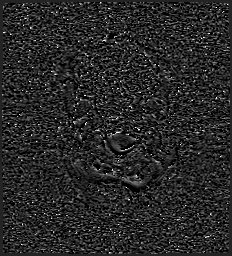
[im 19/55]
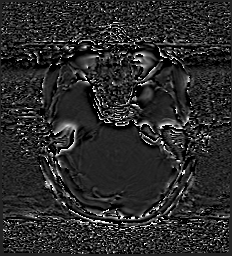
[im 37/55]
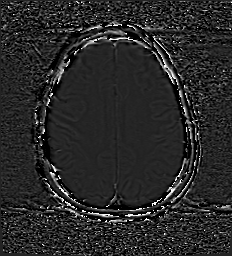
[im 55/55]
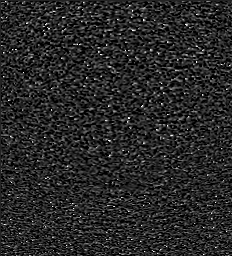

[Series 13: swi_images · axial · 3.0mm · 0.90mm/px · z∈[+9,+184]mm · 4 of 60 slices shown]
[im 1/60]
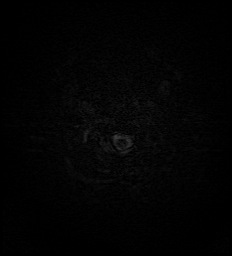
[im 20/60]
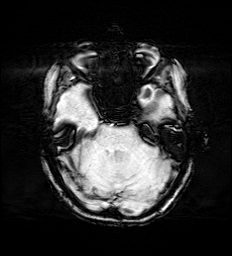
[im 40/60]
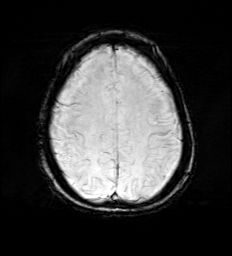
[im 60/60]
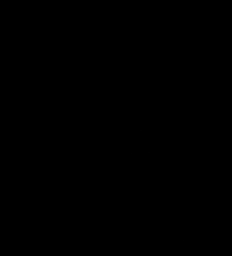

[Series 14: mip_images(sw) · axial · 24.0mm · 0.90mm/px · z∈[+20,+174]mm · 4 of 53 slices shown]
[im 1/53]
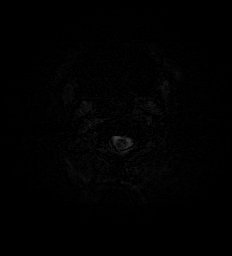
[im 18/53]
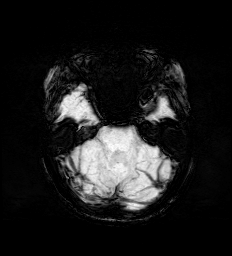
[im 35/53]
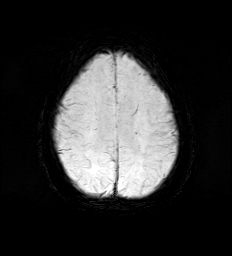
[im 53/53]
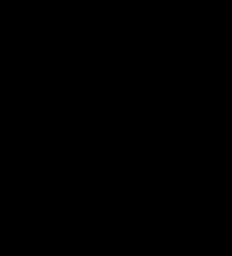

[Series 15: FLAIR · axial · 3.0mm · 0.53mm/px · z∈[+16,+176]mm · 4 of 55 slices shown]
[im 1/55]
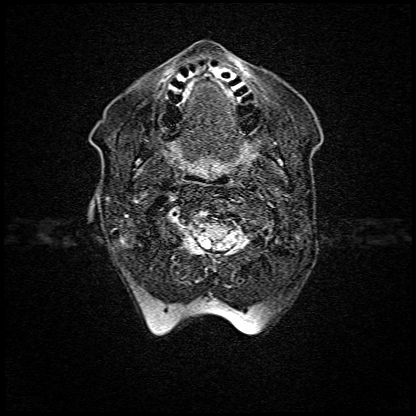
[im 19/55]
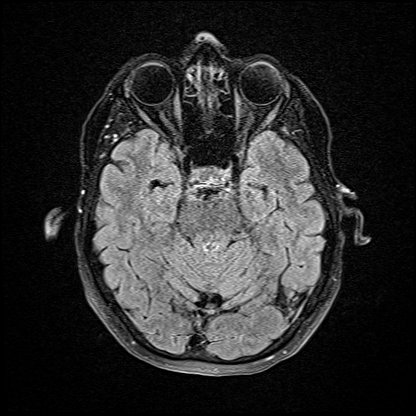
[im 37/55]
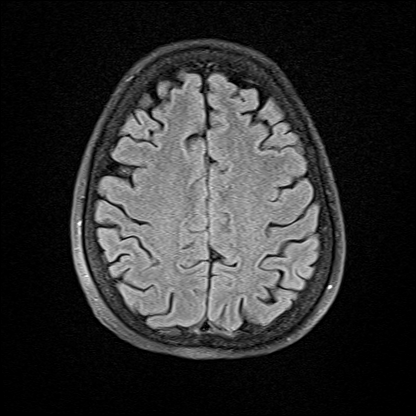
[im 55/55]
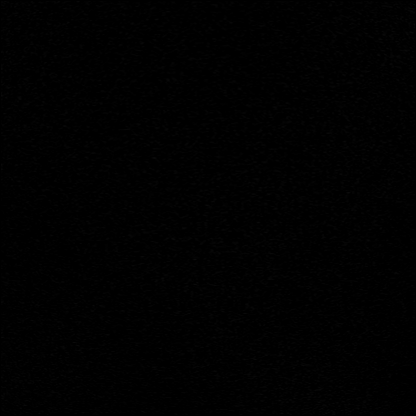

[Series 16: T1 · axial · 1.0mm · 0.98mm/px · z∈[+12,+183]mm · 12 of 170 slices shown (2 of 2)]
[im 1/170]
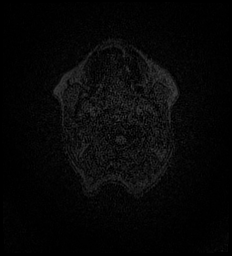
[im 16/170]
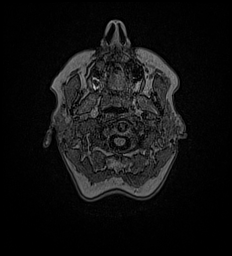
[im 31/170]
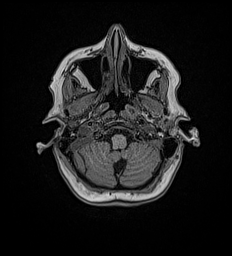
[im 47/170]
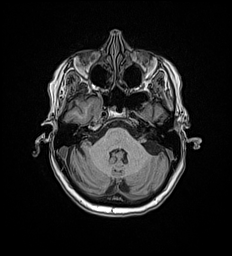
[im 62/170]
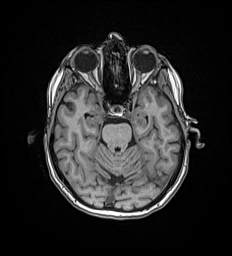
[im 77/170]
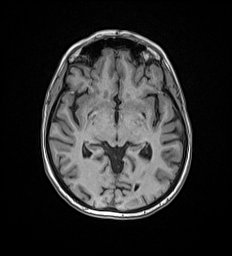
[im 93/170]
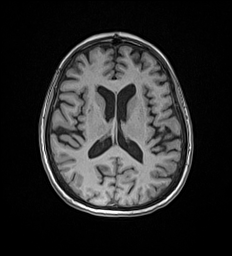
[im 108/170]
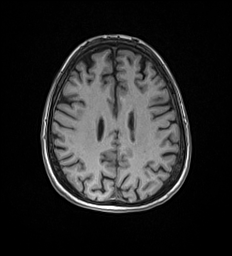
[im 123/170]
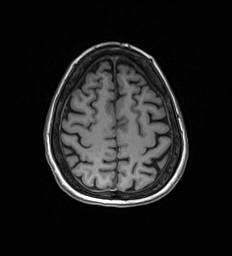
[im 139/170]
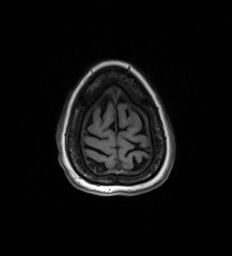
[im 154/170]
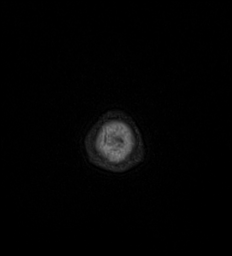
[im 170/170]
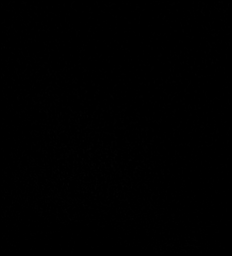

[Series 17: T2 · coronal · 5.0mm · 0.57mm/px · 2 of 29 slices shown (2 of 2)]
[im 1/29]
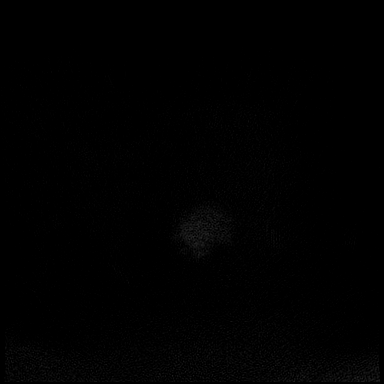
[im 29/29]
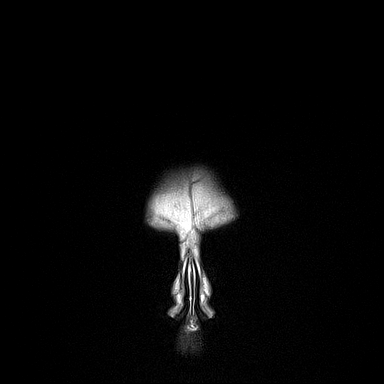

[48 of 48 positions shown; findings below may reference images not displayed]

FINDINGS: Brain:

Cerebral volume appears normal for age.

Minimal multifocal T2 FLAIR hyperintense signal abnormality within
the cerebral white matter, nonspecific but compatible with chronic
small vessel ischemic disease.

There is no acute infarct.

No evidence of an intracranial mass.

No chronic intracranial blood products.

No extra-axial fluid collection.

No midline shift.

Vascular: Maintained flow voids within the proximal large arterial
vessels.

Skull and upper cervical spine: No focal suspicious marrow lesion.

Sinuses/Orbits: Visualized orbits show no acute finding. Small
mucous retention cyst within the right maxillary sinus. Trace
mucosal thickening within the bilateral ethmoid sinuses.

Other: Small-volume fluid within the right mastoid air cells.
IMPRESSION: No evidence of acute intracranial abnormality.

Minimal chronic small-vessel ischemic changes within the cerebral
white matter, stable from the brain MRI of [DATE].

Mild paranasal sinus disease, as described.

Small-volume fluid within the right mastoid air cells.

## 2022-01-16 MED ORDER — ASPIRIN EC 81 MG PO TBEC
81.0000 mg | DELAYED_RELEASE_TABLET | Freq: Every day | ORAL | Status: DC
  Administered 2022-01-16: 81 mg via ORAL
  Filled 2022-01-16: qty 1

## 2022-01-16 MED ORDER — IOHEXOL 350 MG/ML SOLN
125.0000 mL | Freq: Once | INTRAVENOUS | Status: AC | PRN
  Administered 2022-01-16: 125 mL via INTRAVENOUS

## 2022-01-16 MED ORDER — ASPIRIN 81 MG PO TBEC: 81.0000 mg | DELAYED_RELEASE_TABLET | Freq: Every day | ORAL | 11 refills | Status: AC

## 2022-01-16 MED ORDER — HYDRALAZINE HCL 50 MG PO TABS
25.0000 mg | ORAL_TABLET | Freq: Four times a day (QID) | ORAL | Status: DC | PRN
  Administered 2022-01-16: 25 mg via ORAL
  Filled 2022-01-16: qty 1

## 2022-01-16 MED ORDER — ROSUVASTATIN CALCIUM 20 MG PO TABS: 20.0000 mg | ORAL_TABLET | Freq: Every day | ORAL | 1 refills | Status: DC

## 2022-01-16 MED ORDER — DONEPEZIL HCL 5 MG PO TABS: 5.0000 mg | ORAL_TABLET | Freq: Every day | ORAL | Status: DC

## 2022-01-16 MED ORDER — LOSARTAN POTASSIUM 25 MG PO TABS: 25.0000 mg | ORAL_TABLET | Freq: Every day | ORAL | 1 refills | Status: DC

## 2022-01-16 MED ORDER — LOSARTAN POTASSIUM 50 MG PO TABS
25.0000 mg | ORAL_TABLET | Freq: Every day | ORAL | Status: DC
  Administered 2022-01-16: 25 mg via ORAL
  Filled 2022-01-16: qty 1

## 2022-01-16 MED ORDER — ROSUVASTATIN CALCIUM 20 MG PO TABS
20.0000 mg | ORAL_TABLET | Freq: Every day | ORAL | Status: DC
  Administered 2022-01-16: 20 mg via ORAL
  Filled 2022-01-16: qty 1

## 2022-01-16 NOTE — ED Notes (Signed)
Pt resting comfortably at this time. NAD noted. Normal rise and fall of chest. VSS. Call bell in reach.

## 2022-01-16 NOTE — Progress Notes (Signed)
Patient c/o leg cramps and being extremely uncomfortable. Recliner provided as patient says this helps her at home. MD made aware of cramps.

## 2022-01-16 NOTE — H&P (Signed)
History and Physical    Patient: [name]:[id number] DOB: Dec 10, 1940 DOA: 01/15/2022 DOS: the patient was seen and examined on 01/16/2022 PCP: Venia Carbon, MD  Patient coming from: Home  Chief Complaint:  Chief Complaint  Patient presents with   Extremity Weakness    HPI: [name] is a 81 y.o. female with medical history significant of IBS, migraines, gastric ulcers, GERD and anxiety was came to ED with her daughter for concern of elevated blood pressure. According to daughter who was at patient's bedside at the time of interview, patient developed some right upper extremity cramping and numbness around 7 PM last night.  Patient do have history of getting extremity cramping, more on lower extremities than upper but these pain was more severe than her normal cramps.  Symptoms lasted for about 30 minutes and then resolved.  Patient started checking her blood pressure after that incidence and noticed that it was slowly creeping up.  She denies any headache, change in vision, chest pain or shortness of breath.  Patient was not on any antihypertensives at home and frequently checks her blood pressure and mostly they remain in 601U to 932T systolic. Her blood pressure reached up to 557 systolic when she decided to come to ED.  In the ED it was 322 systolic.  Patient denies any recent change in appetite or bowel habits.  No recent illnesses.  No urinary symptoms.  Patient underwent stroke work-up with negative imaging which include CT head, MRI and CTA of head and chest and they were negative for any large vessel occlusion or dissection.  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  Labs were mostly within normal limit.  Lipid profile with elevated total cholesterol and LDH of 146 with a goal of 70.  Review of Systems: As mentioned in the history of present illness. All other systems reviewed and are negative. Past Medical History:  Diagnosis Date   Anxiety    Aortic  atherosclerosis ([place])    GERD (gastroesophageal reflux disease)    Dr. 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 Theodoro Kalata (hard of hearing)    uses hearing aid on left side/complete loss of hearing right side   IBS (irritable bowel syndrome)    LBBB (left bundle branch block)    Migraine    OA (osteoarthritis)    Osteopenia 2005   Ulcer 2003   bleeding stomach ulcer   Past Surgical History:  Procedure Laterality Date   [place] N/A 01/20/2019   Procedure: DIAGNOSTIC LAP WITH SMALL BOWEL RESECTION;  Surgeon: Kieth Brightly Arta Bruce, MD;  Location: [facility];  Service: General;  Laterality: N/A;   [place]   okay, mild changes   [place]  2003   DEXA N/A 2005   borderline osteopenia   DIAGNOSTIC LAPAROSCOPY  11/25/2018   LAPAROSCOPY N/A 11/25/2018   Procedure: LAPAROSCOPY DIAGNOSTIC;  Surgeon: Mickeal Skinner, MD;  Location: [facility];  Service: General;  Laterality: N/A;   [place] ENDOSCOPY  2003   Social History:  reports that she has never smoked. She has never used  smokeless tobacco. She reports that she does not drink alcohol and does not use drugs.  Allergies  Allergen Reactions   Clonazepam Swelling and Other (See Comments)    REACTION: Swollen lips, sore mouth   Penicillins Other (See Comments)    Unrecalled reaction Has patient had a PCN reaction causing immediate rash, facial/tongue/throat swelling, SOB or lightheadedness with hypotension: Unknown Has patient had a PCN reaction causing severe rash involving mucus membranes or skin necrosis: Unknown Has patient had a PCN reaction that required hospitalization: Unknown Has patient had a PCN reaction occurring within the  last 10 years: No If all of the above answers are "NO", then may proceed with Cephalosporin use.     Family History  Problem Relation Age of Onset   Heart attack Father 35   Diabetes Father    Coronary artery disease Father    COPD Mother    Hypertension Mother    Hypertension Sister    Pulmonary fibrosis Sister    Cancer Daughter        breast   Rheum arthritis Daughter    Colon cancer Neg Hx    Esophageal cancer Neg Hx    Rectal cancer Neg Hx     Prior to Admission medications   Medication Sig Start Date End Date Taking? Authorizing Provider  baclofen (LIORESAL) 10 MG tablet Take 10 mg by mouth daily as needed. For headache    [provider]  carbamide peroxide (DEBROX) 6.5 % OTIC solution Place 5 drops into the right ear daily as needed (for wax build up).    [provider]  clorazepate (TRANXENE) 7.5 MG tablet Take 1 tablet (7.5 mg total) by mouth 2 (two) times daily as needed for anxiety. 11/29/21   Venia Carbon, MD  donepezil (ARICEPT) 5 MG tablet Take 1 tablet (5 mg total) by mouth at bedtime. 12/17/21   Venia Carbon, MD  HYDROcodone bit-homatropine (HYCODAN) 5-1.5 MG/5ML syrup Take 5 mLs by mouth at bedtime as needed for cough. 12/17/21   Venia Carbon, MD  Multiple Vitamin (MULTIVITAMIN) tablet Take 1 tablet by mouth daily.    [provider]  polycarbophil (FIBERCON) 625 MG tablet Take 1 tablet (625 mg total) by mouth 2 (two) times daily as needed. 02/06/20   Tonia Ghent, MD  Potassium Gluconate 550 MG TABS Take 550 mg by mouth 3 (three) times a week.    [provider]  Probiotic Product (PROBIOTIC PO) Take 1 capsule by mouth daily.    [provider]  triamcinolone ointment (KENALOG) 0.1 % Apply to affected area 3x/week, increasing to daily with flares. 05/01/21   Brendolyn Patty, MD  vitamin B-12 (CYANOCOBALAMIN) 1000 MCG tablet Take 1,000 mcg by mouth daily.    [provider]  zonisamide (ZONEGRAN)  100 MG capsule Take 100 mg by mouth daily.    [provider]    Physical Exam: Vitals:   01/16/22 0545 01/16/22 0603 01/16/22 0630 01/16/22 0700  BP:  (!) 155/74 139/71 130/61  Pulse: 65 (!) 52 63 63  Resp: (!) 21 18 17  (!) 22  Temp:      TempSrc:      SpO2: 99% 98% 99% 97%  Weight:      Height:       Vitals:   01/16/22 1000 01/16/22 1205 01/16/22 1243 01/16/22 1300  BP: (!) 168/62 (!) 164/63 (!) 169/60 (!) 175/69  Pulse: 75 73 69 61  Resp: 16 14 15  18  Temp:      TempSrc:      SpO2: 100% 100% 91% 98%  Weight:      Height:       General: Vital signs reviewed.  Patient is well-developed and well-nourished, in no acute distress and cooperative with exam.  Head: Normocephalic and atraumatic. Eyes: EOMI, conjunctivae normal, no scleral icterus.  Neck: Supple, trachea midline, normal ROM, no JVD, masses, thyromegaly, or carotid bruit present.  Cardiovascular: RRR, S1 normal, S2 normal, no murmurs, gallops, or rubs. Pulmonary/Chest: Clear to auscultation bilaterally, no wheezes, rales, or rhonchi. Abdominal: Soft, non-tender, non-distended, BS +,  Extremities: No lower extremity edema bilaterally,  pulses symmetric and intact bilaterally. No cyanosis or clubbing. Neurological: A&O x3, Strength is normal and symmetric bilaterally, cranial nerve II-XII are grossly intact, no focal motor deficit, sensory intact to light touch bilaterally.  Skin: Warm, dry and intact. No rashes or erythema. Psychiatric: Normal mood and affect. speech and behavior is normal. Cognition and memory are normal.   Data Reviewed: Notes from PCP and prior providers were reviewed.  Crohn's disease was mentioned in her problem list but there was no formal diagnosis, irritable bowel was mentioned in GI note. Labs and imaging were reviewed .  Assessment and Plan:  Hypertension/hypertensive urgency.  No prior diagnosis.  Blood pressure remained elevated after getting labetalol although with some  improvement.  Symptoms were concerning of hypertensive urgency.  As all imaging were within normal limit. -Add low-dose losartan -As needed hydralazine  TIA.  TIA cannot be ruled out because of negative imaging. -Complete CVA work-up -Start her on low-dose aspirin and statin based on lipid profile and concerning symptoms.  Anxiety. -Continue home meds  Advance Care Planning:   Code Status: Prior full  Consults: None  Family Communication: Discussed with daughter and husband at bedside  Severity of Illness: The appropriate patient status for this patient is OBSERVATION. Observation status is judged to be reasonable and necessary in order to provide the required intensity of service to ensure the patient's safety. The patient's presenting symptoms, physical exam findings, and initial radiographic and laboratory data in the context of their medical condition is felt to place them at decreased risk for further clinical deterioration. Furthermore, it is anticipated that the patient will be medically stable for discharge from the hospital within 2 midnights of admission.   Author: Lorella Nimrod, MD 01/16/2022 7:49 AM  For on call review [email or web address].

## 2022-01-16 NOTE — Progress Notes (Signed)
*  PRELIMINARY RESULTS* Echocardiogram 2D Echocardiogram has been performed.  [name] [place] 01/16/2022, 12:38 PM

## 2022-01-16 NOTE — Progress Notes (Signed)
Lab notified of pending collections.

## 2022-01-16 NOTE — Discharge Summary (Signed)
Physician Discharge Summary   Patient: [name] MRN: [id number] DOB: 1940/12/21  Admit date:     01/15/2022  Discharge date: 01/16/22  Discharge Physician: Lorella Nimrod   PCP: Venia Carbon, MD   Recommendations at discharge:  1.  Follow-up with primary care provider within a week  Discharge Diagnoses: Principal Problem:   TIA (transient ischemic attack)   Hospital Course: NETTA FODGE is a 81 y.o. female with medical history significant of IBS, migraines, gastric ulcers, GERD and anxiety was came to ED with her daughter for concern of elevated blood pressure. According to daughter who was at patient's bedside at the time of interview, patient developed some right upper extremity cramping and numbness around 7 PM last night.  Patient do have history of getting extremity cramping, more on lower extremities than upper but these pain was more severe than her normal cramps.  Symptoms lasted for about 30 minutes and then resolved.  Patient started checking her blood pressure after that incidence and noticed that it was slowly creeping up.  She denies any headache, change in vision, chest pain or shortness of breath.  Patient was not on any antihypertensives at home and frequently checks her blood pressure and mostly they remain in 431V to 400Q systolic. Her blood pressure reached up to 676 systolic when she decided to come to ED.  In the ED it was 195 systolic. Patient received labetalol with some improvement in blood pressure.  She was started on losartan at 25 mg daily and also received one-time dose of hydralazine as blood pressure started creeping up again.  Patient underwent stroke work-up with negative imaging which include CT head, MRI and CTA of head and chest and they were negative for any large vessel occlusion or dissection.  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  Labs were mostly within normal limit.  Lipid profile with elevated total cholesterol and LDH of 146 with  a goal of 70.  She was started on low-dose aspirin and Crestor.  Echocardiogram within normal limit.  Patient needs a close follow-up with PCP for better control of his blood pressure and titration of antihypertensives.   Consultants: None Procedures performed: None Disposition: Home Diet recommendation:  Discharge Diet Orders (From admission, onward)     Start     Ordered   01/16/22 0000  Diet - low sodium heart healthy        01/16/22 1620           Cardiac diet  DISCHARGE MEDICATION: Allergies as of 01/16/2022       Reactions   Clonazepam Swelling, Other (See Comments)   REACTION: Swollen lips, sore mouth   Penicillins Other (See Comments)   Unrecalled reaction Has patient had a PCN reaction causing immediate rash, facial/tongue/throat swelling, SOB or lightheadedness with hypotension: Unknown Has patient had a PCN reaction causing severe rash involving mucus membranes or skin necrosis: Unknown Has patient had a PCN reaction that required hospitalization: Unknown Has patient had a PCN reaction occurring within the last 10 years: No If all of the above answers are "NO", then may proceed with Cephalosporin use.        Medication List     STOP taking these medications    polycarbophil 625 MG tablet Commonly known as: FIBERCON   triamcinolone ointment 0.1 % Commonly known as: KENALOG       TAKE these medications    aspirin 81 MG EC tablet Take 1 tablet (81  mg total) by mouth daily. Swallow whole. Start taking on: January 17, 2022   baclofen 10 MG tablet Commonly known as: LIORESAL Take 10 mg by mouth daily as needed. For headache   carbamide peroxide 6.5 % OTIC solution Commonly known as: DEBROX Place 5 drops into the right ear daily as needed (for wax build up).   clorazepate 7.5 MG tablet Commonly known as: TRANXENE Take 1 tablet (7.5 mg total) by mouth 2 (two) times daily as needed for anxiety.   donepezil 5 MG tablet Commonly known as:  ARICEPT Take 1 tablet (5 mg total) by mouth at bedtime.   HYDROcodone bit-homatropine 5-1.5 MG/5ML syrup Commonly known as: HYCODAN Take 5 mLs by mouth at bedtime as needed for cough.   losartan 25 MG tablet Commonly known as: COZAAR Take 1 tablet (25 mg total) by mouth daily. Start taking on: January 17, 2022   multivitamin tablet Take 1 tablet by mouth daily.   Potassium Gluconate 550 MG Tabs Take 550 mg by mouth 3 (three) times a week.   PROBIOTIC PO Take 1 capsule by mouth daily.   rosuvastatin 20 MG tablet Commonly known as: CRESTOR Take 1 tablet (20 mg total) by mouth daily. Start taking on: January 17, 2022   vitamin B-12 1000 MCG tablet Commonly known as: CYANOCOBALAMIN Take 1,000 mcg by mouth daily.   zonisamide 100 MG capsule Commonly known as: ZONEGRAN Take 100 mg by mouth daily.         Discharge Exam: Filed Weights   01/15/22 2251  Weight: 54.4 kg   General.     In no acute distress. Pulmonary.  Lungs clear bilaterally, normal respiratory effort. CV.  Regular rate and rhythm, no JVD, rub or murmur. Abdomen.  Soft, nontender, nondistended, BS positive. CNS.  Alert and oriented x3.  No focal neurologic deficit. Extremities.  No edema, no cyanosis, pulses intact and symmetrical. Psychiatry.  Judgment and insight appears normal.   Condition at discharge: stable  The results of significant diagnostics from this hospitalization (including imaging, microbiology, ancillary and laboratory) are listed below for reference.   Imaging Studies: CT ANGIO HEAD NECK W WO CM  Result Date: 01/16/2022 CLINICAL DATA:  Initial evaluation for neuro deficit, stroke suspected. EXAM: CT ANGIOGRAPHY HEAD AND NECK TECHNIQUE: Multidetector CT imaging of the head and neck was performed using the standard protocol during bolus administration of intravenous contrast. Multiplanar CT image reconstructions and MIPs were obtained to evaluate the vascular anatomy. Carotid stenosis  measurements (when applicable) are obtained utilizing NASCET criteria, using the distal internal carotid diameter as the denominator. RADIATION DOSE REDUCTION: This exam was performed according to the departmental dose-optimization program which includes automated exposure control, adjustment of the mA and/or kV according to patient size and/or use of iterative reconstruction technique. CONTRAST:  143m OMNIPAQUE IOHEXOL 350 MG/ML SOLN COMPARISON:  Prior CT from 01/15/2022. FINDINGS: CTA NECK FINDINGS Aortic arch: Visualized aortic arch normal in caliber with normal 3 vessel morphology. No stenosis about the origin the great vessels. Right carotid system: Right common and internal carotid arteries patent without significant stenosis or dissection. Multifocal irregularity involving the mid-distal cervical right ICA suggestive of possible FMD. Left carotid system: Left common and internal carotid arteries patent without stenosis or dissection. 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 Vertebral arteries: Both vertebral arteries arise from the subclavian arteries. No proximal subclavian artery stenosis. Both vertebral arteries widely patent without stenosis, dissection or occlusion. Skeleton: No discrete or  worrisome osseous lesions. Other neck: No other acute soft tissue abnormality within the neck. Tiny 3 mm left thyroid nodule noted, of doubtful significance given size and patient age, no follow-up imaging recommended (ref: J Am Coll Radiol. 2015 Feb;12(2): 143-50). Upper chest: Visualized upper chest demonstrates no acute finding. Review of the MIP images confirms the above findings CTA HEAD FINDINGS Anterior circulation: Both internal carotid arteries widely patent to the termini without stenosis. 4 mm outpouching arising from the cavernous left ICA consistent with an aneurysm (series 8, image 249). A1 segments patent bilaterally. Right A1 hypoplastic. Normal  anterior communicating artery complex. Anterior cerebral arteries patent without stenosis. No M1 stenosis or occlusion. Normal MCA bifurcations. Distal MCA branches perfused and symmetric. Posterior circulation: Left vertebral artery dominant and widely patent to the vertebrobasilar junction. Right vertebral artery largely terminates in PICA, although a tiny branch ascending towards the vertebrobasilar junction as well. Both PICA patent. Basilar patent to its distal aspect without stenosis. Superior cerebellar arteries patent bilaterally. Left PCA supplied via the basilar. Fetal type origin right PCA. Both PCAs well perfused or distal aspects. Venous sinuses: Patent allowing for timing the contrast bolus. Anatomic variants: As above. Review of the MIP images confirms the above findings IMPRESSION: 1. Negative CTA for large vessel occlusion or other acute vascular abnormality. No hemodynamically significant or correctable stenosis. 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 3. Multifocal irregularity involving the mid-distal cervical ICAs bilaterally, suggesting mild FMD. Electronically Signed   By: Jeannine Boga M.D.   On: 01/16/2022 01:50   CT HEAD WO CONTRAST  Result Date: 01/15/2022 CLINICAL DATA:  Right arm numbness. Neuro deficit, acute, stroke suspected EXAM: CT HEAD WITHOUT CONTRAST TECHNIQUE: Contiguous axial images were obtained from the base of the skull through the vertex without intravenous contrast. RADIATION DOSE REDUCTION: This exam was performed according to the departmental dose-optimization program which includes automated exposure control, adjustment of the mA and/or kV according to patient size and/or use of iterative reconstruction technique. COMPARISON:  02/09/2016 FINDINGS: Brain: No acute intracranial abnormality. Specifically, no hemorrhage, hydrocephalus, mass lesion, acute infarction, or significant intracranial injury. Vascular: No hyperdense vessel or unexpected calcification. Skull:  No acute calvarial abnormality. Sinuses/Orbits: No acute findings Other: None IMPRESSION: Normal study. Electronically Signed   By: Rolm Baptise M.D.   On: 01/15/2022 23:36   MR BRAIN WO CONTRAST  Result Date: 01/16/2022 CLINICAL DATA:  Transient ischemic attack. Additional history provided: Right arm numbness and pain. Headache. History of Bell's palsy and migraine. Chest pain. EXAM: MRI HEAD WITHOUT CONTRAST TECHNIQUE: Multiplanar, multiecho pulse sequences of the brain and surrounding structures were obtained without intravenous contrast. COMPARISON:  CT angiogram head/neck 01/16/2022. Head CT 01/15/2022. Brain MRI 03/12/2021. FINDINGS: Brain: Cerebral volume appears normal for age. Minimal multifocal T2 FLAIR hyperintense signal abnormality within the cerebral white matter, nonspecific but compatible with chronic small vessel ischemic disease. There is no acute infarct. No evidence of an intracranial mass. No chronic intracranial blood products. No extra-axial fluid collection. No midline shift. Vascular: Maintained flow voids within the proximal large arterial vessels. Skull and upper cervical spine: No focal suspicious marrow lesion. Sinuses/Orbits: Visualized orbits show no acute finding. Small mucous retention cyst within the right maxillary sinus. Trace mucosal thickening within the bilateral ethmoid sinuses. Other: Small-volume fluid within the right mastoid air cells. IMPRESSION: No evidence of acute intracranial abnormality. Minimal chronic small-vessel ischemic changes within the cerebral white matter, stable from the brain MRI of 03/12/2021. Mild paranasal sinus disease, as described.  Small-volume fluid within the right mastoid air cells. Electronically Signed   By: Kellie Simmering D.O.   On: 01/16/2022 08:44   DG Chest Portable 1 View  Result Date: 01/15/2022 CLINICAL DATA:  Hypertension, right arm pain, back pain EXAM: PORTABLE CHEST 1 VIEW COMPARISON:  None. FINDINGS: The heart size and  mediastinal contours are within normal limits. Both lungs are clear. The visualized skeletal structures are unremarkable. IMPRESSION: No active disease. Electronically Signed   By: Fidela Salisbury M.D.   On: 01/15/2022 23:40   ECHOCARDIOGRAM COMPLETE  Result Date: 01/16/2022    ECHOCARDIOGRAM REPORT   Patient Name:   [name] Date of Exam: 01/16/2022 Medical Rec #:  [id number]       Height:       62.0 in Accession #:    [id number]      Weight:       120.0 lb Date of Birth:  1940-12-09       BSA:          1.539 m Patient Age:    34 years        BP:           168/62 mmHg Patient Gender: F               HR:           67 bpm. Exam Location:  [facility] Procedure: 2D Echo, Color Doppler and Cardiac Doppler Indications:     G45.9 TIA  History:         Patient has no prior history of Echocardiogram examinations.                  Risk Factors:Dyslipidemia.  Sonographer:     Charmayne Sheer Referring Phys:  [id number] Garrick Midgley Diagnosing Phys: Ida Rogue MD  Sonographer Comments: Suboptimal parasternal window. IMPRESSIONS  1. Left ventricular ejection fraction, by estimation, is 60 to 65%. The left ventricle has normal function. The left ventricle has no regional wall motion abnormalities. There is mild left ventricular hypertrophy. Left ventricular diastolic parameters are consistent with Grade I diastolic dysfunction (impaired relaxation).  2. Right ventricular systolic function is normal. The right ventricular size is normal.  3. The mitral valve is normal in structure. No evidence of mitral valve regurgitation. No evidence of mitral stenosis.  4. The aortic valve was not well visualized. Aortic valve regurgitation is not visualized. No aortic stenosis is present.  5. The inferior vena cava is normal in size with greater than 50% respiratory variability, suggesting right atrial pressure of 3 mmHg. FINDINGS  Left Ventricle: Left ventricular ejection fraction, by estimation, is 60 to 65%. The left ventricle has normal  function. The left ventricle has no regional wall motion abnormalities. The left ventricular internal cavity size was normal in size. There is  mild left ventricular hypertrophy. Left ventricular diastolic parameters are consistent with Grade I diastolic dysfunction (impaired relaxation). Right Ventricle: The right ventricular size is normal. No increase in right ventricular wall thickness. Right ventricular systolic function is normal. Left Atrium: Left atrial size was normal in size. Right Atrium: Right atrial size was normal in size. Pericardium: There is no evidence of pericardial effusion. Mitral Valve: The mitral valve is normal in structure. No evidence of mitral valve regurgitation. No evidence of mitral valve stenosis. MV peak gradient, 5.1 mmHg. The mean mitral valve gradient is 2.0 mmHg. Tricuspid Valve: The tricuspid valve is normal in structure. Tricuspid valve regurgitation is not demonstrated. No evidence of  tricuspid stenosis. Aortic Valve: The aortic valve was not well visualized. Aortic valve regurgitation is not visualized. No aortic stenosis is present. Aortic valve mean gradient measures 5.0 mmHg. Aortic valve peak gradient measures 8.0 mmHg. Aortic valve area, by VTI measures 1.53 cm. Pulmonic Valve: The pulmonic valve was normal in structure. Pulmonic valve regurgitation is not visualized. No evidence of pulmonic stenosis. Aorta: The aortic root is normal in size and structure. Venous: The inferior vena cava is normal in size with greater than 50% respiratory variability, suggesting right atrial pressure of 3 mmHg. IAS/Shunts: No atrial level shunt detected by color flow Doppler.  LEFT VENTRICLE PLAX 2D LVIDd:         3.44 cm   Diastology LVIDs:         2.02 cm   LV e' medial:    5.11 cm/s LV PW:         1.10 cm   LV E/e' medial:  15.4 LV IVS:        0.67 cm   LV e' lateral:   5.22 cm/s LVOT diam:     1.80 cm   LV E/e' lateral: 15.1 LV SV:         47 LV SV Index:   30 LVOT Area:     2.54 cm   RIGHT VENTRICLE RV Basal diam:  2.08 cm TAPSE (M-mode): 2.3 cm LEFT ATRIUM             Index        RIGHT ATRIUM          Index LA diam:        3.40 cm 2.21 cm/m   RA Area:     5.62 cm LA Vol (A2C):   22.7 ml 14.75 ml/m  RA Volume:   7.69 ml  5.00 ml/m LA Vol (A4C):   22.7 ml 14.75 ml/m LA Biplane Vol: 23.4 ml 15.21 ml/m  AORTIC VALVE                     PULMONIC VALVE AV Area (Vmax):    1.66 cm      PV Vmax:       0.85 m/s AV Area (Vmean):   1.64 cm      PV Vmean:      54.800 cm/s AV Area (VTI):     1.53 cm      PV VTI:        0.184 m AV Vmax:           141.00 cm/s   PV Peak grad:  2.9 mmHg AV Vmean:          104.000 cm/s  PV Mean grad:  1.0 mmHg AV VTI:            0.305 m AV Peak Grad:      8.0 mmHg AV Mean Grad:      5.0 mmHg LVOT Vmax:         92.00 cm/s LVOT Vmean:        67.200 cm/s LVOT VTI:          0.183 m LVOT/AV VTI ratio: 0.60  AORTA Ao Root diam: 2.60 cm MITRAL VALVE MV Area (PHT): 4.74 cm     SHUNTS MV Area VTI:   1.79 cm     Systemic VTI:  0.18 m MV Peak grad:  5.1 mmHg     Systemic Diam: 1.80 cm MV Mean grad:  2.0 mmHg MV Vmax:  1.13 m/s MV Vmean:      67.3 cm/s MV Decel Time: 160 msec MV E velocity: 78.80 cm/s MV A velocity: 115.00 cm/s MV E/A ratio:  0.69 Ida Rogue MD Electronically signed by Ida Rogue MD Signature Date/Time: 01/16/2022/2:29:55 PM    Final    CT Angio Chest/Abd/Pel for Dissection W and/or Wo Contrast  Result Date: 01/16/2022 CLINICAL DATA:  Acute aortic syndrome suspected. Right arm numbness and pain tonight. Headache. Chest pain. EXAM: CT ANGIOGRAPHY CHEST, ABDOMEN AND PELVIS TECHNIQUE: Non-contrast CT of the chest was initially obtained. Multidetector CT imaging through the chest, abdomen and pelvis was performed using the standard protocol during bolus administration of intravenous contrast. Multiplanar reconstructed images and MIPs were obtained and reviewed to evaluate the vascular anatomy. RADIATION DOSE REDUCTION: This exam was performed according  to the departmental dose-optimization program which includes automated exposure control, adjustment of the mA and/or kV according to patient size and/or use of iterative reconstruction technique. CONTRAST:  166m OMNIPAQUE IOHEXOL 350 MG/ML SOLN COMPARISON:  CT abdomen and pelvis 08/19/2018 FINDINGS: CTA CHEST FINDINGS Cardiovascular: Unenhanced images of the chest demonstrate calcification in the aorta and coronary arteries. No evidence of intramural hematoma. Images obtained during the arterial phase after contrast administration demonstrate normal caliber thoracic aorta with wide patency. No evidence of aortic dissection. Great vessel origins are patent. Normal heart size. No pericardial effusions. Moderately good opacification of the central pulmonary arteries without evidence of significant pulmonary embolus. Mediastinum/Nodes: Thyroid gland is unremarkable. Esophagus is decompressed. Scattered lymph nodes are not pathologically enlarged, likely reactive. Lungs/Pleura: Lungs are clear. No pleural effusions. No pneumothorax. Musculoskeletal: No chest wall abnormality. No acute or significant osseous findings. Review of the MIP images confirms the above findings. CTA ABDOMEN AND PELVIS FINDINGS VASCULAR Aorta: Normal caliber aorta without aneurysm, dissection, vasculitis or significant stenosis. Scattered aortic calcification and noncalcific plaque formation. Celiac: Patent without evidence of aneurysm, dissection, vasculitis or significant stenosis. SMA: Patent without evidence of aneurysm, dissection, vasculitis or significant stenosis. Renals: Both renal arteries are patent without evidence of aneurysm, dissection, vasculitis, fibromuscular dysplasia or significant stenosis. IMA: Patent without evidence of aneurysm, dissection, vasculitis or significant stenosis. Inflow: Patent without evidence of aneurysm, dissection, vasculitis or significant stenosis. Veins: No obvious venous abnormality within the  limitations of this arterial phase study. Review of the MIP images confirms the above findings. NON-VASCULAR Hepatobiliary: Scattered liver cysts with benign appearance. No solid nodules. Surgical absence of the gallbladder. Bile ducts are not dilated. Pancreas: Unremarkable. No pancreatic ductal dilatation or surrounding inflammatory changes. Spleen: Normal in size without focal abnormality. Adrenals/Urinary Tract: Adrenal glands are unremarkable. Kidneys are normal, without renal calculi, focal lesion, or hydronephrosis. Bladder is unremarkable. Stomach/Bowel: Stomach, small bowel, and colon are not abnormally distended. Scattered stool in the colon. Appendix is not identified. Sigmoid colonic anastomosis. Lymphatic: No significant lymphadenopathy. Reproductive: Status post hysterectomy. No adnexal masses. Other: No abdominal wall hernia or abnormality. No abdominopelvic ascites. Musculoskeletal: No acute or significant osseous findings. Review of the MIP images confirms the above findings. IMPRESSION: 1. No evidence of aneurysm, dissection, or significant stenosis of the thoracic or abdominal aorta or major branch vessels. 2. No evidence of central pulmonary embolus. 3. No evidence of active pulmonary disease. 4. Aortic atherosclerosis. 5. No acute process demonstrated in the abdomen or pelvis. Electronically Signed   By: WLucienne CapersM.D.   On: 01/16/2022 01:07    Microbiology: Results for orders placed or performed during the hospital encounter of 01/15/22  Resp  Panel by RT-PCR (Flu A&B, Covid) Nasopharyngeal Swab     Status: None   Collection Time: 01/15/22 11:20 PM   Specimen: Nasopharyngeal Swab; Nasopharyngeal(NP) swabs in vial transport medium  Result Value Ref Range Status   SARS Coronavirus 2 by RT PCR NEGATIVE NEGATIVE Final    Comment: (NOTE) SARS-CoV-2 target nucleic acids are NOT DETECTED.  The SARS-CoV-2 RNA is generally detectable in upper respiratory specimens during the acute  phase of infection. The lowest concentration of SARS-CoV-2 viral copies this assay can detect is 138 copies/mL. A negative result does not preclude SARS-Cov-2 infection and should not be used as the sole basis for treatment or other patient management decisions. A negative result may occur with  improper specimen collection/handling, submission of specimen other than nasopharyngeal swab, presence of viral mutation(s) within the areas targeted by this assay, and inadequate number of viral copies(<138 copies/mL). A negative result must be combined with clinical observations, patient history, and epidemiological information. The expected result is Negative.  Fact Sheet for Patients:  EntrepreneurPulse.com.au  Fact Sheet for Healthcare Providers:  IncredibleEmployment.be  This test is no t yet approved or cleared by the [place] FDA and  has been authorized for detection and/or diagnosis of SARS-CoV-2 by FDA under an Emergency Use Authorization (EUA). This EUA will remain  in effect (meaning this test can be used) for the duration of the COVID-19 declaration under Section 564(b)(1) of the Act, 21 U.S.C.section 360bbb-3(b)(1), unless the authorization is terminated  or revoked sooner.       Influenza A by PCR NEGATIVE NEGATIVE Final   Influenza B by PCR NEGATIVE NEGATIVE Final    Comment: (NOTE) The Xpert Xpress SARS-CoV-2/FLU/RSV plus assay is intended as an aid in the diagnosis of influenza from Nasopharyngeal swab specimens and should not be used as a sole basis for treatment. Nasal washings and aspirates are unacceptable for Xpert Xpress SARS-CoV-2/FLU/RSV testing.  Fact Sheet for Patients: EntrepreneurPulse.com.au  Fact Sheet for Healthcare Providers: IncredibleEmployment.be  This test is not yet approved or cleared by the [place] FDA and has been authorized for detection and/or diagnosis of  SARS-CoV-2 by FDA under an Emergency Use Authorization (EUA). This EUA will remain in effect (meaning this test can be used) for the duration of the COVID-19 declaration under Section 564(b)(1) of the Act, 21 U.S.C. section 360bbb-3(b)(1), unless the authorization is terminated or revoked.  Performed at [facility], [address], [place] [address]     Labs: CBC: Recent Labs  Lab 01/15/22 2320  WBC 5.2  NEUTROABS 2.8  HGB 14.0  HCT 41.7  MCV 92.7  PLT 956   Basic Metabolic Panel: Recent Labs  Lab 01/15/22 2320 01/16/22 1519  NA 135  --   K 3.5 3.8  CL 105  --   CO2 24  --   GLUCOSE 105*  --   BUN 10  --   CREATININE 0.76  --   CALCIUM 8.8*  --   MG 2.0  --    Liver Function Tests: Recent Labs  Lab 01/15/22 2320  AST 23  ALT 17  ALKPHOS 70  BILITOT 0.5  PROT 7.1  ALBUMIN 4.3   CBG: No results for input(s): GLUCAP in the last 168 hours.  Discharge time spent: greater than 30 minutes.  Signed: Lorella Nimrod, MD [facility] 01/16/2022

## 2022-01-17 ENCOUNTER — Other Ambulatory Visit: Payer: Self-pay

## 2022-01-17 ENCOUNTER — Emergency Department (HOSPITAL_COMMUNITY)
Admission: EM | Admit: 2022-01-17 | Discharge: 2022-01-18 | Disposition: A | Discharge status: Home / Self Care | Payer: Medicare HMO | Attending: Emergency Medicine | Admitting: Emergency Medicine

## 2022-01-17 ENCOUNTER — Telehealth: Payer: Self-pay

## 2022-01-17 ENCOUNTER — Ambulatory Visit
Admission: RE | Admit: 2022-01-17 | Discharge: 2022-01-17 | Disposition: A | Discharge status: Home / Self Care | Payer: Medicare HMO | Source: Ambulatory Visit | Attending: Internal Medicine | Admitting: Internal Medicine

## 2022-01-17 VITALS — BP 167/75 | HR 77 | Temp 98.1°F | Resp 18

## 2022-01-17 DIAGNOSIS — I10 Essential (primary) hypertension: Secondary | ICD-10-CM | POA: Insufficient documentation

## 2022-01-17 DIAGNOSIS — Z79899 Other long term (current) drug therapy: Secondary | ICD-10-CM | POA: Insufficient documentation

## 2022-01-17 DIAGNOSIS — I160 Hypertensive urgency: Secondary | ICD-10-CM

## 2022-01-17 DIAGNOSIS — M791 Myalgia, unspecified site: Secondary | ICD-10-CM

## 2022-01-17 DIAGNOSIS — Z7982 Long term (current) use of aspirin: Secondary | ICD-10-CM | POA: Diagnosis not present

## 2022-01-17 LAB — URINALYSIS, ROUTINE W REFLEX MICROSCOPIC
Bilirubin Urine: NEGATIVE
Glucose, UA: NEGATIVE mg/dL
Hgb urine dipstick: NEGATIVE
Ketones, ur: NEGATIVE mg/dL
Nitrite: NEGATIVE
Protein, ur: NEGATIVE mg/dL
Specific Gravity, Urine: 1.002 — ABNORMAL LOW (ref 1.005–1.030)
pH: 6 (ref 5.0–8.0)

## 2022-01-17 NOTE — Discharge Instructions (Addendum)
Go to the emergency department if you have any concerning symptoms such as weakness, numbness, chest pain, shortness of breath.   Follow up with your doctor on Tuesday as scheduled.   Your blood pressure is elevated today at 162/79; repeat 167/75.

## 2022-01-17 NOTE — Telephone Encounter (Signed)
Will await the evaluation at ER or urgent care

## 2022-01-17 NOTE — Telephone Encounter (Signed)
[facility] - Client TELEPHONE ADVICE RECORD [facility] Patient Name: [name] Gender: Female DOB: 1941/05/31 Age: 81 Y 9 M 30 D Return Phone Number: [phone] (Primary), [phone] (Secondary), [phone] (Alternate) Address: City/ State/ Zip: [place] [address] Client [facility] - Client Client Site [place] - Day Provider Viviana Simpler- MD Contact Type Call Who Is Calling Patient / Member / Family / Caregiver Call Type Triage / Clinical Caller Name Cherie Relationship To Patient Daughter Return Phone Number [phone] (Primary) Chief Complaint Numbness Reason for Call Symptomatic / Request for [place] states her mother has been recently release from the hospital. Caller states her mother has been experiencing high blood pressure and leg numbness. Additional Comment Caller states if she does not answer to call her sister Larene Beach. Calle rmay be in a meeting shortly. Translation No Nurse Assessment Nurse: Raphael Gibney, RN, Vera Date/Time (Eastern Time): 01/17/2022 10:00:00 AM Confirm and document reason for call. If symptomatic, describe symptoms. ---Caller states her mother was discharged yesterday afternoon at 5 pm. She went to the hospital for pain in her right arm and tingling. no chest pain. BP was elevated. BP in ER 226/80. MRI, CT of brain, EKG, echo were ordered. Results were normal. last night, she was very hot. Her legs were numb last night and she could not get up. left leg is still a little numb. Does the patient have any new or worsening symptoms? ---Yes Will a triage be completed? ---Yes Related visit to physician within the last 2 weeks? ---Yes Does the PT have any chronic conditions? (i.e. diabetes, asthma, this includes High risk factors for pregnancy, etc.) ---Yes List chronic conditions. ---Crohn's disease; HTN Is this  a behavioral health or substance abuse call? ---No PLEASE NOTE: All timestamps contained within this report are represented as [place] Standard Time. CONFIDENTIALTY NOTICE: This fax transmission is intended only for the addressee. It contains information that is legally privileged, confidential or otherwise protected from use or disclosure. If you are not the intended recipient, you are strictly prohibited from reviewing, disclosing, copying using or disseminating any of this information or taking any action in reliance on or regarding this information. If you have received this fax in error, please notify us immediately by telephone so that we can arrange for its return to [place]. Phone: [phone], Toll-Free: [phone], Fax: [phone] Page: 2 of 2 Call Id: [id number] Guidelines Guideline Title Affirmed Question Affirmed Notes Nurse Date/Time Eilene Ghazi Time) Neurologic Deficit [1] Numbness (i.e., loss of sensation) of the face, arm / hand, or leg / foot on one side of the body AND [2] sudden onset AND [3] brief (now gone) Raphael Gibney, Therapist, sports, Vanita Ingles 01/17/2022 10:12:17 AM Disp. Time Eilene Ghazi Time) Disposition Final User 01/17/2022 10:22:35 AM Go to ED Now (or PCP triage) Yes Raphael Gibney, RN, Doreatha Lew Disagree/Comply Disagree Caller Understands Yes PreDisposition Call Doctor Care Advice Given Per Guideline GO TO ED NOW (OR PCP TRIAGE): * IF NO PCP (PRIMARY CARE PROVIDER) SECOND-LEVEL TRIAGE: You need to be seen within the next hour. Go to the [facility] at _____________ [place] as soon as you can. * It is better and safer if another adult drives instead of you. CARE ADVICE given per Neurologic Deficit (Adult) guideline. ANOTHER ADULT SHOULD DRIVE: Comments User: Dannielle Burn, RN Date/Time Eilene Ghazi Time): 01/17/2022 10:01:32 AM caller is not with the pt. User: Dannielle Burn, RN Date/Time Eilene Ghazi Time): 01/17/2022 10:22:15 AM pt does not  want go to back to the ER User: Dannielle Burn,  RN Date/Time Eilene Ghazi Time): 01/17/2022 10:24:18 AM called back line and gave report to [place] that triage outcome go to ER now (or PCP triage) but pt does not want to go. States to send note User: Dannielle Burn, RN Date/Time Eilene Ghazi Time): 01/17/2022 10:25:05 AM please call number listed as primary. It may be a new number Referrals GO TO FACILITY REFUSE

## 2022-01-17 NOTE — ED Provider Triage Note (Signed)
Emergency Medicine Provider Triage Evaluation Note  [name] , a 81 y.o. female  was evaluated in triage.  Pt complains of hypertension.  Patient was recently admitted to [facility] with hypertensive emergency, stroke rule out.  She had onset of severe pain in her right arm and her fingers drew up and spasmed.  She had very elevated blood pressure which is new for her.  She was admitted with extensive work-up including MRI echocardiogram, DVT studies all of which were negative.  She was started on losartan but had 1 elevated episode of blood pressure was treated with hydrochlorothiazide as well but not discharged with it.  Tonight her blood pressure was elevated at 167 and continued to elevate and was at 198 at home.  They called her PCP as well as the pharmacist who told her to come to the emergency department she has no other complaints at this time  Review of Systems  Positive: Hypertension Negative: Headache  Physical Exam  BP (!) 167/75 (BP Location: Left Arm)    Pulse 68    Temp 98.5 F (36.9 C) (Oral)    Resp 20    SpO2 99%  Gen:   Awake, no distress   Resp:  Normal effort  MSK:   Moves extremities without difficulty  Other:    Medical Decision Making  Medically screening exam initiated at 11:30 PM.  Appropriate orders placed.  [name] was informed that the remainder of the evaluation will be completed by another provider, this initial triage assessment does not replace that evaluation, and the importance of remaining in the ED until their evaluation is complete.  Work-up initiated   Margarita Mail, PA-C 01/17/22 [id number]

## 2022-01-17 NOTE — Telephone Encounter (Signed)
Agree pt needs to be seen at ER or UC.

## 2022-01-17 NOTE — ED Provider Notes (Signed)
[name]    CSN: [id number] Arrival date & time: 01/17/22  [place]      History   Chief Complaint Chief Complaint  Patient presents with   Leg Pain    HPI [name] is a 81 y.o. female.  Accompanied by her daughter, patient presents with bilateral thigh soreness.  Patient is concerned for the possibility of blood clots in her legs.  Her leg soreness started last night after getting home from a prolonged ED visit; her legs felt weak and numb after sitting on the toilet for a couple of minutes last night; her husband had to assist her back to bed and then rub her legs to relieve cramping.  She was seen in the ED on 01/15/2022; diagnosed with right arm pain, right arm numbness, hypertension; she was discharged home on losartan and rosuvastatin.  She has a follow up appointment with her PCP on 01/21/2022.  Patient denies current numbness, focal weakness, chest pain, shortness of breath, dizziness, or other symptoms.  Her medical history includes aortic atherosclerosis, hyperlipidemia, left branch block, Crohn's disease  The history is provided by the patient and medical records.   Past Medical History:  Diagnosis Date   Anxiety    Aortic atherosclerosis ([place])    GERD (gastroesophageal reflux disease)    Dr. 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 Theodoro Kalata (hard of hearing)    uses hearing aid on left side/complete loss of hearing right side   IBS (irritable bowel syndrome)    LBBB (left bundle branch block)    Migraine    OA (osteoarthritis)    Osteopenia 2005   Ulcer 2003   bleeding stomach ulcer    Patient Active Problem List   Diagnosis Date Noted   TIA (transient ischemic attack) 01/16/2022   Cough 12/17/2021   Costochondritis, acute 12/17/2021   Mild cognitive impairment 07/13/2019   Crohn's disease ([place]) 03/31/2019    Tremor 03/31/2019   Aortic atherosclerosis ([place])    Advance directive discussed with patient 06/05/2016   Vitamin B12 deficiency 10/15/2012   Migraine syndrome 04/16/2012   Routine general medical examination at a health care facility 04/16/2011   OSTEOARTHRITIS 07/20/2007   Hyperlipemia 07/08/2007   Mood disorder ([place]) 07/08/2007   GERD 07/08/2007   IBS 07/08/2007   OSTEOPENIA 07/08/2007    Past Surgical History:  Procedure Laterality Date   [place] N/A 01/20/2019   Procedure: DIAGNOSTIC LAP WITH SMALL BOWEL RESECTION;  Surgeon: Mickeal Skinner, MD;  Location: [facility];  Service: General;  Laterality: N/A;   [place]   okay, mild changes   [place]  2003   DEXA N/A 2005   borderline osteopenia   DIAGNOSTIC LAPAROSCOPY  11/25/2018   LAPAROSCOPY N/A 11/25/2018   Procedure: LAPAROSCOPY DIAGNOSTIC;  Surgeon: Mickeal Skinner, MD;  Location: [facility];  Service: General;  Laterality: N/A;   [place] ENDOSCOPY  2003    OB History   No obstetric history on file.      Home Medications    Prior to Admission medications   Medication Sig Start Date End Date  Taking? Authorizing Provider  aspirin EC 81 MG EC tablet Take 1 tablet (81 mg total) by mouth daily. Swallow whole. 01/17/22   Lorella Nimrod, MD  baclofen (LIORESAL) 10 MG tablet Take 10 mg by mouth daily as needed. For headache    [provider]  carbamide peroxide (DEBROX) 6.5 % OTIC solution Place 5 drops into the right ear daily as needed (for wax build up).    [provider]  clorazepate (TRANXENE) 7.5 MG tablet Take 1 tablet (7.5 mg total) by mouth 2 (two) times daily as needed for anxiety. 11/29/21   Venia Carbon, MD  donepezil (ARICEPT) 5 MG tablet Take 1 tablet (5 mg total) by mouth at bedtime. 12/17/21   Venia Carbon, MD   HYDROcodone bit-homatropine (HYCODAN) 5-1.5 MG/5ML syrup Take 5 mLs by mouth at bedtime as needed for cough. 12/17/21   Venia Carbon, MD  losartan (COZAAR) 25 MG tablet Take 1 tablet (25 mg total) by mouth daily. 01/17/22   Lorella Nimrod, MD  Multiple Vitamin (MULTIVITAMIN) tablet Take 1 tablet by mouth daily.    [provider]  Potassium Gluconate 550 MG TABS Take 550 mg by mouth 3 (three) times a week.    [provider]  Probiotic Product (PROBIOTIC PO) Take 1 capsule by mouth daily.    [provider]  rosuvastatin (CRESTOR) 20 MG tablet Take 1 tablet (20 mg total) by mouth daily. 01/17/22   Lorella Nimrod, MD  vitamin B-12 (CYANOCOBALAMIN) 1000 MCG tablet Take 1,000 mcg by mouth daily.    [provider]  zonisamide (ZONEGRAN) 100 MG capsule Take 100 mg by mouth daily.    [provider]    Family History Family History  Problem Relation Age of Onset   Heart attack Father 15   Diabetes Father    Coronary artery disease Father    COPD Mother    Hypertension Mother    Hypertension Sister    Pulmonary fibrosis Sister    Cancer Daughter        breast   Rheum arthritis Daughter    Colon cancer Neg Hx    Esophageal cancer Neg Hx    Rectal cancer Neg Hx     Social History Social History   Tobacco Use   Smoking status: Never   Smokeless tobacco: Never  Vaping Use   Vaping Use: Never used  Substance Use Topics   Alcohol use: No   Drug use: No     Allergies   Clonazepam and Penicillins   Review of Systems Review of Systems  Constitutional:  Negative for chills and fever.  Respiratory:  Negative for cough and shortness of breath.   Cardiovascular:  Negative for chest pain and palpitations.  Musculoskeletal:  Positive for myalgias. Negative for arthralgias and gait problem.  Skin:  Negative for color change, rash and wound.  Neurological:  Negative for dizziness, seizures, syncope, facial asymmetry, speech difficulty,  weakness, numbness and headaches.  All other systems reviewed and are negative.   Physical Exam Triage Vital Signs ED Triage Vitals  Enc Vitals Group     BP      Pulse      Resp      Temp      Temp src      SpO2      Weight      Height      Head Circumference      Peak Flow      Pain  Score      Pain Loc      Pain Edu?      Excl. in [place]?    No data found.  Updated Vital Signs BP (!) 167/75 (BP Location: Left Arm)    Pulse 77    Temp 98.1 F (36.7 C)    Resp 18    SpO2 97%   Visual Acuity Right Eye Distance:   Left Eye Distance:   Bilateral Distance:    Right Eye Near:   Left Eye Near:    Bilateral Near:     Physical Exam Vitals and nursing note reviewed.  Constitutional:      General: She is not in acute distress.    Appearance: Normal appearance. She is well-developed. She is not ill-appearing.  HENT:     Mouth/Throat:     Mouth: Mucous membranes are moist.  Cardiovascular:     Rate and Rhythm: Normal rate and regular rhythm.     Heart sounds: Normal heart sounds.  Pulmonary:     Effort: Pulmonary effort is normal. No respiratory distress.     Breath sounds: Normal breath sounds.  Abdominal:     Palpations: Abdomen is soft.     Tenderness: There is no abdominal tenderness.  Musculoskeletal:        General: No swelling, tenderness, deformity or signs of injury.     Cervical back: Neck supple.  Skin:    General: Skin is warm and dry.     Capillary Refill: Capillary refill takes less than 2 seconds.     Findings: No bruising, erythema, lesion or rash.  Neurological:     General: No focal deficit present.     Mental Status: She is alert and oriented to person, place, and time.     Cranial Nerves: No cranial nerve deficit.     Sensory: No sensory deficit.     Motor: No weakness.     Gait: Gait normal.  Psychiatric:        Mood and Affect: Mood normal.        Behavior: Behavior normal.     UC Treatments / Results  Labs (all labs ordered are  listed, but only abnormal results are displayed) Labs Reviewed - No data to display  EKG   Radiology CT ANGIO HEAD NECK W WO CM  Result Date: 01/16/2022 CLINICAL DATA:  Initial evaluation for neuro deficit, stroke suspected. EXAM: CT ANGIOGRAPHY HEAD AND NECK TECHNIQUE: Multidetector CT imaging of the head and neck was performed using the standard protocol during bolus administration of intravenous contrast. Multiplanar CT image reconstructions and MIPs were obtained to evaluate the vascular anatomy. Carotid stenosis measurements (when applicable) are obtained utilizing NASCET criteria, using the distal internal carotid diameter as the denominator. RADIATION DOSE REDUCTION: This exam was performed according to the departmental dose-optimization program which includes automated exposure control, adjustment of the mA and/or kV according to patient size and/or use of iterative reconstruction technique. CONTRAST:  166m OMNIPAQUE IOHEXOL 350 MG/ML SOLN COMPARISON:  Prior CT from 01/15/2022. FINDINGS: CTA NECK FINDINGS Aortic arch: Visualized aortic arch normal in caliber with normal 3 vessel morphology. No stenosis about the origin the great vessels. Right carotid system: Right common and internal carotid arteries patent without significant stenosis or dissection. Multifocal irregularity involving the mid-distal cervical right ICA suggestive of possible FMD. Left carotid system: Left common and internal carotid arteries patent without stenosis or dissection. 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  Vertebral arteries: Both vertebral arteries arise from the subclavian arteries. No proximal subclavian artery stenosis. Both vertebral arteries widely patent without stenosis, dissection or occlusion. Skeleton: No discrete or worrisome osseous lesions. Other neck: No other acute soft tissue abnormality within the neck. Tiny 3 mm left thyroid nodule noted, of doubtful  significance given size and patient age, no follow-up imaging recommended (ref: J Am Coll Radiol. 2015 Feb;12(2): 143-50). Upper chest: Visualized upper chest demonstrates no acute finding. Review of the MIP images confirms the above findings CTA HEAD FINDINGS Anterior circulation: Both internal carotid arteries widely patent to the termini without stenosis. 4 mm outpouching arising from the cavernous left ICA consistent with an aneurysm (series 8, image 249). A1 segments patent bilaterally. Right A1 hypoplastic. Normal anterior communicating artery complex. Anterior cerebral arteries patent without stenosis. No M1 stenosis or occlusion. Normal MCA bifurcations. Distal MCA branches perfused and symmetric. Posterior circulation: Left vertebral artery dominant and widely patent to the vertebrobasilar junction. Right vertebral artery largely terminates in PICA, although a tiny branch ascending towards the vertebrobasilar junction as well. Both PICA patent. Basilar patent to its distal aspect without stenosis. Superior cerebellar arteries patent bilaterally. Left PCA supplied via the basilar. Fetal type origin right PCA. Both PCAs well perfused or distal aspects. Venous sinuses: Patent allowing for timing the contrast bolus. Anatomic variants: As above. Review of the MIP images confirms the above findings IMPRESSION: 1. Negative CTA for large vessel occlusion or other acute vascular abnormality. No hemodynamically significant or correctable stenosis. 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 3. Multifocal irregularity involving the mid-distal cervical ICAs bilaterally, suggesting mild FMD. Electronically Signed   By: Jeannine Boga M.D.   On: 01/16/2022 01:50   CT HEAD WO CONTRAST  Result Date: 01/15/2022 CLINICAL DATA:  Right arm numbness. Neuro deficit, acute, stroke suspected EXAM: CT HEAD WITHOUT CONTRAST TECHNIQUE: Contiguous axial images were obtained from the base of the skull through the vertex without  intravenous contrast. RADIATION DOSE REDUCTION: This exam was performed according to the departmental dose-optimization program which includes automated exposure control, adjustment of the mA and/or kV according to patient size and/or use of iterative reconstruction technique. COMPARISON:  02/09/2016 FINDINGS: Brain: No acute intracranial abnormality. Specifically, no hemorrhage, hydrocephalus, mass lesion, acute infarction, or significant intracranial injury. Vascular: No hyperdense vessel or unexpected calcification. Skull: No acute calvarial abnormality. Sinuses/Orbits: No acute findings Other: None IMPRESSION: Normal study. Electronically Signed   By: Rolm Baptise M.D.   On: 01/15/2022 23:36   MR BRAIN WO CONTRAST  Result Date: 01/16/2022 CLINICAL DATA:  Transient ischemic attack. Additional history provided: Right arm numbness and pain. Headache. History of Bell's palsy and migraine. Chest pain. EXAM: MRI HEAD WITHOUT CONTRAST TECHNIQUE: Multiplanar, multiecho pulse sequences of the brain and surrounding structures were obtained without intravenous contrast. COMPARISON:  CT angiogram head/neck 01/16/2022. Head CT 01/15/2022. Brain MRI 03/12/2021. FINDINGS: Brain: Cerebral volume appears normal for age. Minimal multifocal T2 FLAIR hyperintense signal abnormality within the cerebral white matter, nonspecific but compatible with chronic small vessel ischemic disease. There is no acute infarct. No evidence of an intracranial mass. No chronic intracranial blood products. No extra-axial fluid collection. No midline shift. Vascular: Maintained flow voids within the proximal large arterial vessels. Skull and upper cervical spine: No focal suspicious marrow lesion. Sinuses/Orbits: Visualized orbits show no acute finding. Small mucous retention cyst within the right maxillary sinus. Trace mucosal thickening within the bilateral ethmoid sinuses. Other: Small-volume fluid within the right mastoid air cells. IMPRESSION:  No evidence of acute intracranial abnormality. Minimal chronic small-vessel ischemic changes within the cerebral white matter, stable from the brain MRI of 03/12/2021. Mild paranasal sinus disease, as described. Small-volume fluid within the right mastoid air cells. Electronically Signed   By: Kellie Simmering D.O.   On: 01/16/2022 08:44   DG Chest Portable 1 View  Result Date: 01/15/2022 CLINICAL DATA:  Hypertension, right arm pain, back pain EXAM: PORTABLE CHEST 1 VIEW COMPARISON:  None. FINDINGS: The heart size and mediastinal contours are within normal limits. Both lungs are clear. The visualized skeletal structures are unremarkable. IMPRESSION: No active disease. Electronically Signed   By: Fidela Salisbury M.D.   On: 01/15/2022 23:40   ECHOCARDIOGRAM COMPLETE  Result Date: 01/16/2022    ECHOCARDIOGRAM REPORT   Patient Name:   CAITLEN WORTH Date of Exam: 01/16/2022 Medical Rec #:  [id number]       Height:       62.0 in Accession #:    [id number]      Weight:       120.0 lb Date of Birth:  [birth year]       BSA:          1.539 m Patient Age:    79 years        BP:           168/62 mmHg Patient Gender: F               HR:           67 bpm. Exam Location:  [facility] Procedure: 2D Echo, Color Doppler and Cardiac Doppler Indications:     G45.9 TIA  History:         Patient has no prior history of Echocardiogram examinations.                  Risk Factors:Dyslipidemia.  Sonographer:     Charmayne Sheer Referring Phys:  [id number] LXBWIOM AMIN Diagnosing Phys: Ida Rogue MD  Sonographer Comments: Suboptimal parasternal window. IMPRESSIONS  1. Left ventricular ejection fraction, by estimation, is 60 to 65%. The left ventricle has normal function. The left ventricle has no regional wall motion abnormalities. There is mild left ventricular hypertrophy. Left ventricular diastolic parameters are consistent with Grade I diastolic dysfunction (impaired relaxation).  2. Right ventricular systolic function is normal. The right  ventricular size is normal.  3. The mitral valve is normal in structure. No evidence of mitral valve regurgitation. No evidence of mitral stenosis.  4. The aortic valve was not well visualized. Aortic valve regurgitation is not visualized. No aortic stenosis is present.  5. The inferior vena cava is normal in size with greater than 50% respiratory variability, suggesting right atrial pressure of 3 mmHg. FINDINGS  Left Ventricle: Left ventricular ejection fraction, by estimation, is 60 to 65%. The left ventricle has normal function. The left ventricle has no regional wall motion abnormalities. The left ventricular internal cavity size was normal in size. There is  mild left ventricular hypertrophy. Left ventricular diastolic parameters are consistent with Grade I diastolic dysfunction (impaired relaxation). Right Ventricle: The right ventricular size is normal. No increase in right ventricular wall thickness. Right ventricular systolic function is normal. Left Atrium: Left atrial size was normal in size. Right Atrium: Right atrial size was normal in size. Pericardium: There is no evidence of pericardial effusion. Mitral Valve: The mitral valve is normal in structure. No evidence of mitral valve regurgitation. No evidence of mitral valve stenosis. MV peak gradient,  5.1 mmHg. The mean mitral valve gradient is 2.0 mmHg. Tricuspid Valve: The tricuspid valve is normal in structure. Tricuspid valve regurgitation is not demonstrated. No evidence of tricuspid stenosis. Aortic Valve: The aortic valve was not well visualized. Aortic valve regurgitation is not visualized. No aortic stenosis is present. Aortic valve mean gradient measures 5.0 mmHg. Aortic valve peak gradient measures 8.0 mmHg. Aortic valve area, by VTI measures 1.53 cm. Pulmonic Valve: The pulmonic valve was normal in structure. Pulmonic valve regurgitation is not visualized. No evidence of pulmonic stenosis. Aorta: The aortic root is normal in size and  structure. Venous: The inferior vena cava is normal in size with greater than 50% respiratory variability, suggesting right atrial pressure of 3 mmHg. IAS/Shunts: No atrial level shunt detected by color flow Doppler.  LEFT VENTRICLE PLAX 2D LVIDd:         3.44 cm   Diastology LVIDs:         2.02 cm   LV e' medial:    5.11 cm/s LV PW:         1.10 cm   LV E/e' medial:  15.4 LV IVS:        0.67 cm   LV e' lateral:   5.22 cm/s LVOT diam:     1.80 cm   LV E/e' lateral: 15.1 LV SV:         47 LV SV Index:   30 LVOT Area:     2.54 cm  RIGHT VENTRICLE RV Basal diam:  2.08 cm TAPSE (M-mode): 2.3 cm LEFT ATRIUM             Index        RIGHT ATRIUM          Index LA diam:        3.40 cm 2.21 cm/m   RA Area:     5.62 cm LA Vol (A2C):   22.7 ml 14.75 ml/m  RA Volume:   7.69 ml  5.00 ml/m LA Vol (A4C):   22.7 ml 14.75 ml/m LA Biplane Vol: 23.4 ml 15.21 ml/m  AORTIC VALVE                     PULMONIC VALVE AV Area (Vmax):    1.66 cm      PV Vmax:       0.85 m/s AV Area (Vmean):   1.64 cm      PV Vmean:      54.800 cm/s AV Area (VTI):     1.53 cm      PV VTI:        0.184 m AV Vmax:           141.00 cm/s   PV Peak grad:  2.9 mmHg AV Vmean:          104.000 cm/s  PV Mean grad:  1.0 mmHg AV VTI:            0.305 m AV Peak Grad:      8.0 mmHg AV Mean Grad:      5.0 mmHg LVOT Vmax:         92.00 cm/s LVOT Vmean:        67.200 cm/s LVOT VTI:          0.183 m LVOT/AV VTI ratio: 0.60  AORTA Ao Root diam: 2.60 cm MITRAL VALVE MV Area (PHT): 4.74 cm     SHUNTS MV Area VTI:   1.79 cm     Systemic  VTI:  0.18 m MV Peak grad:  5.1 mmHg     Systemic Diam: 1.80 cm MV Mean grad:  2.0 mmHg MV Vmax:       1.13 m/s MV Vmean:      67.3 cm/s MV Decel Time: 160 msec MV E velocity: 78.80 cm/s MV A velocity: 115.00 cm/s MV E/A ratio:  0.69 Ida Rogue MD Electronically signed by Ida Rogue MD Signature Date/Time: 01/16/2022/2:29:55 PM    Final    CT Angio Chest/Abd/Pel for Dissection W and/or Wo Contrast  Result Date:  01/16/2022 CLINICAL DATA:  Acute aortic syndrome suspected. Right arm numbness and pain tonight. Headache. Chest pain. EXAM: CT ANGIOGRAPHY CHEST, ABDOMEN AND PELVIS TECHNIQUE: Non-contrast CT of the chest was initially obtained. Multidetector CT imaging through the chest, abdomen and pelvis was performed using the standard protocol during bolus administration of intravenous contrast. Multiplanar reconstructed images and MIPs were obtained and reviewed to evaluate the vascular anatomy. RADIATION DOSE REDUCTION: This exam was performed according to the departmental dose-optimization program which includes automated exposure control, adjustment of the mA and/or kV according to patient size and/or use of iterative reconstruction technique. CONTRAST:  13m OMNIPAQUE IOHEXOL 350 MG/ML SOLN COMPARISON:  CT abdomen and pelvis 08/19/2018 FINDINGS: CTA CHEST FINDINGS Cardiovascular: Unenhanced images of the chest demonstrate calcification in the aorta and coronary arteries. No evidence of intramural hematoma. Images obtained during the arterial phase after contrast administration demonstrate normal caliber thoracic aorta with wide patency. No evidence of aortic dissection. Great vessel origins are patent. Normal heart size. No pericardial effusions. Moderately good opacification of the central pulmonary arteries without evidence of significant pulmonary embolus. Mediastinum/Nodes: Thyroid gland is unremarkable. Esophagus is decompressed. Scattered lymph nodes are not pathologically enlarged, likely reactive. Lungs/Pleura: Lungs are clear. No pleural effusions. No pneumothorax. Musculoskeletal: No chest wall abnormality. No acute or significant osseous findings. Review of the MIP images confirms the above findings. CTA ABDOMEN AND PELVIS FINDINGS VASCULAR Aorta: Normal caliber aorta without aneurysm, dissection, vasculitis or significant stenosis. Scattered aortic calcification and noncalcific plaque formation. Celiac: Patent  without evidence of aneurysm, dissection, vasculitis or significant stenosis. SMA: Patent without evidence of aneurysm, dissection, vasculitis or significant stenosis. Renals: Both renal arteries are patent without evidence of aneurysm, dissection, vasculitis, fibromuscular dysplasia or significant stenosis. IMA: Patent without evidence of aneurysm, dissection, vasculitis or significant stenosis. Inflow: Patent without evidence of aneurysm, dissection, vasculitis or significant stenosis. Veins: No obvious venous abnormality within the limitations of this arterial phase study. Review of the MIP images confirms the above findings. NON-VASCULAR Hepatobiliary: Scattered liver cysts with benign appearance. No solid nodules. Surgical absence of the gallbladder. Bile ducts are not dilated. Pancreas: Unremarkable. No pancreatic ductal dilatation or surrounding inflammatory changes. Spleen: Normal in size without focal abnormality. Adrenals/Urinary Tract: Adrenal glands are unremarkable. Kidneys are normal, without renal calculi, focal lesion, or hydronephrosis. Bladder is unremarkable. Stomach/Bowel: Stomach, small bowel, and colon are not abnormally distended. Scattered stool in the colon. Appendix is not identified. Sigmoid colonic anastomosis. Lymphatic: No significant lymphadenopathy. Reproductive: Status post hysterectomy. No adnexal masses. Other: No abdominal wall hernia or abnormality. No abdominopelvic ascites. Musculoskeletal: No acute or significant osseous findings. Review of the MIP images confirms the above findings. IMPRESSION: 1. No evidence of aneurysm, dissection, or significant stenosis of the thoracic or abdominal aorta or major branch vessels. 2. No evidence of central pulmonary embolus. 3. No evidence of active pulmonary disease. 4. Aortic atherosclerosis. 5. No acute process demonstrated in the abdomen or pelvis.  Electronically Signed   By: Lucienne Capers M.D.   On: 01/16/2022 01:07     Procedures Procedures (including critical care time)  Medications Ordered in UC Medications - No data to display  Initial Impression / Assessment and Plan / UC Course  I have reviewed the triage vital signs and the nursing notes.  Pertinent labs & imaging results that were available during my care of the patient were reviewed by me and considered in my medical decision making (see chart for details).   Myalgias of legs, elevated blood pressure with hypertension.  Patient and her daughter decline transfer to the ED.  They decline EKG today.  Discussed limitations of evaluation in an urgent care setting.  Discussed her concern for blood clots in her legs and that we could not rule that out here in the urgent care setting; I explained that at this time in the evening outpatient ultrasound is not available.  Discussed that blood clots could cause death.  Patient and her daughter again decline transfer to the ED.  She has an appointment with her PCP on 01/21/2022; she will consider going to the ED if she has persistent or worsening symptoms.  Strict ED precautions discussed.  Her blood pressure is elevated today; instructed her to keep a daily record of her blood pressures at home to take to her PCP.  Patient and her daughter agree to plan of care.  Final Clinical Impressions(s) / UC Diagnoses   Final diagnoses:  Myalgia  Elevated blood pressure reading in office with diagnosis of hypertension     Discharge Instructions      Go to the emergency department if you have any concerning symptoms such as weakness, numbness, chest pain, shortness of breath.   Follow up with your doctor on Tuesday as scheduled.   Your blood pressure is elevated today at 162/79; repeat 167/75.             ED Prescriptions   None    PDMP not reviewed this encounter.   Sharion Balloon, NP 01/17/22 1736

## 2022-01-17 NOTE — ED Triage Notes (Signed)
Pt states last night while using the bathroom she could not get off the toilet and when she did stand up her legs gave out. Today she states her bilateral thighs feel sore.

## 2022-01-17 NOTE — Telephone Encounter (Addendum)
I spoke with pt and pts daughter [name]. BP now is 161/77 P78. Last night pt had numbness in lt leg and could not walk. Today pt said slight numbness in upper lt leg. Pt said feels like pulled muscle or muscle soreness. No available appts at [facility] or [facility]. I scheduled pt appt at [facility] 01/17/22 at 4 pm. [name] is going to talk with her sister to see if just going to UC and walkin. Pt prefers not to go to ED; just had bunch of test at hospital. Today pt does not have CP, and no SOB, No H/A, dizziness or vision changes. No redness,pain or swelling in lt leg.[name] has a funeral to go to and her sister is with her daughter right now and pt does not want to go anywhere without one of her children. UC & ED precautions given and pt an [name] voiced understanding. Sending note to Dr Silvio Pate who is out of office [facility] CMA and Dr Diona Browner who is in office.

## 2022-01-18 ENCOUNTER — Encounter (HOSPITAL_COMMUNITY): Payer: Self-pay | Admitting: Emergency Medicine

## 2022-01-18 ENCOUNTER — Other Ambulatory Visit: Payer: Self-pay

## 2022-01-18 LAB — BASIC METABOLIC PANEL
Anion gap: 7 (ref 5–15)
BUN: 6 mg/dL — ABNORMAL LOW (ref 8–23)
CO2: 23 mmol/L (ref 22–32)
Calcium: 9.1 mg/dL (ref 8.9–10.3)
Chloride: 110 mmol/L (ref 98–111)
Creatinine, Ser: 0.84 mg/dL (ref 0.44–1.00)
GFR, Estimated: >60 mL/min (ref >60)
Glucose, Bld: 109 mg/dL — ABNORMAL HIGH (ref 70–99)
Potassium: 4.3 mmol/L (ref 3.5–5.1)
Sodium: 140 mmol/L (ref 135–145)

## 2022-01-18 LAB — CBC WITH DIFFERENTIAL/PLATELET
Abs Immature Granulocytes: 0.01 10*3/uL (ref 0.00–0.07)
Basophils Absolute: 0 10*3/uL (ref 0.0–0.1)
Basophils Relative: 1 %
Eosinophils Absolute: 0.2 10*3/uL (ref 0.0–0.5)
Eosinophils Relative: 4 %
HCT: 41 % (ref 36.0–46.0)
Hemoglobin: 13.8 g/dL (ref 12.0–15.0)
Immature Granulocytes: 0 %
Lymphocytes Relative: 33 %
Lymphs Abs: 2 10*3/uL (ref 0.7–4.0)
MCH: 31.1 pg (ref 26.0–34.0)
MCHC: 33.7 g/dL (ref 30.0–36.0)
MCV: 92.3 fL (ref 80.0–100.0)
Monocytes Absolute: 0.5 10*3/uL (ref 0.1–1.0)
Monocytes Relative: 8 %
Neutro Abs: 3.3 10*3/uL (ref 1.7–7.7)
Neutrophils Relative %: 54 %
Platelets: 165 10*3/uL (ref 150–400)
RBC: 4.44 MIL/uL (ref 3.87–5.11)
RDW: 12.6 % (ref 11.5–15.5)
WBC: 6 10*3/uL (ref 4.0–10.5)
nRBC: 0 % (ref 0.0–0.2)

## 2022-01-18 LAB — TROPONIN I (HIGH SENSITIVITY): Troponin I (High Sensitivity): 5 ng/L (ref <18)

## 2022-01-18 MED ORDER — CLONIDINE HCL 0.1 MG PO TABS: ORAL_TABLET | ORAL | 0 refills | Status: DC

## 2022-01-18 NOTE — Discharge Instructions (Signed)
Take clonidine as prescribed as needed for blood pressures greater than 185.  Keep a record of your blood pressures and take this with you to your next doctor's appointment.  Ideally this should be within the next week.

## 2022-01-18 NOTE — ED Triage Notes (Signed)
Pt reported to ED with c/o HTN, has gradually increased throughout the evening. Pt reports being recently discharged from hospital for similar episode, which lead to stroke workup and is concerned of the same.

## 2022-01-18 NOTE — ED Provider Notes (Signed)
[facility] EMERGENCY DEPARTMENT Provider Note   CSN: [id number] Arrival date & time: 01/17/22  2230     History  Chief Complaint  Patient presents with   Hypertension    [name] is a 81 y.o. female.  Patient is a 81 year old female with history of recently diagnosed hypertension.  She presents today for elevated blood pressure.  Patient was recently hospitalized at [facility] for hypertensive crisis.  She was started on losartan and given doses of hydralazine intravenously.  Her blood pressures resolved and work-up was otherwise unremarkable and she was discharged several days ago.  This evening, while checking her blood pressure she obtained readings of 160, then 170, then at 1 point was 198.  They spoke with the nurse on-call for her primary doctor who advised him to come to the ER to be evaluated.  Patient denies to me she is having any headache, weakness, numbness, chest pain, or other symptoms.  The history is provided by the patient.  Hypertension This is a new problem. Pertinent negatives include no chest pain, no headaches and no shortness of breath. Nothing aggravates the symptoms. Nothing relieves the symptoms. She has tried nothing for the symptoms.      Home Medications Prior to Admission medications   Medication Sig Start Date End Date Taking? Authorizing Provider  aspirin EC 81 MG EC tablet Take 1 tablet (81 mg total) by mouth daily. Swallow whole. 01/17/22   Lorella Nimrod, MD  baclofen (LIORESAL) 10 MG tablet Take 10 mg by mouth daily as needed. For headache    [provider]  carbamide peroxide (DEBROX) 6.5 % OTIC solution Place 5 drops into the right ear daily as needed (for wax build up).    [provider]  clorazepate (TRANXENE) 7.5 MG tablet Take 1 tablet (7.5 mg total) by mouth 2 (two) times daily as needed for anxiety. 11/29/21   Venia Carbon, MD  donepezil (ARICEPT) 5 MG tablet Take 1 tablet (5 mg total) by mouth  at bedtime. 12/17/21   Venia Carbon, MD  HYDROcodone bit-homatropine (HYCODAN) 5-1.5 MG/5ML syrup Take 5 mLs by mouth at bedtime as needed for cough. 12/17/21   Venia Carbon, MD  losartan (COZAAR) 25 MG tablet Take 1 tablet (25 mg total) by mouth daily. 01/17/22   Lorella Nimrod, MD  Multiple Vitamin (MULTIVITAMIN) tablet Take 1 tablet by mouth daily.    [provider]  Potassium Gluconate 550 MG TABS Take 550 mg by mouth 3 (three) times a week.    [provider]  Probiotic Product (PROBIOTIC PO) Take 1 capsule by mouth daily.    [provider]  rosuvastatin (CRESTOR) 20 MG tablet Take 1 tablet (20 mg total) by mouth daily. 01/17/22   Lorella Nimrod, MD  vitamin B-12 (CYANOCOBALAMIN) 1000 MCG tablet Take 1,000 mcg by mouth daily.    [provider]  zonisamide (ZONEGRAN) 100 MG capsule Take 100 mg by mouth daily.    [provider]      Allergies    Clonazepam and Penicillins    Review of Systems   Review of Systems  Respiratory:  Negative for shortness of breath.   Cardiovascular:  Negative for chest pain.  Neurological:  Negative for headaches.  All other systems reviewed and are negative.  Physical Exam Updated Vital Signs BP (!) 167/75 (BP Location: Left Arm)    Pulse 68    Temp 98.5 F (36.9 C) (Oral)    Resp  20    SpO2 99%  Physical Exam Vitals and nursing note reviewed.  Constitutional:      General: She is not in acute distress.    Appearance: She is well-developed. She is not diaphoretic.  HENT:     Head: Normocephalic and atraumatic.  Eyes:     Extraocular Movements: Extraocular movements intact.     Pupils: Pupils are equal, round, and reactive to light.  Cardiovascular:     Rate and Rhythm: Normal rate and regular rhythm.     Heart sounds: No murmur heard.   No friction rub. No gallop.  Pulmonary:     Effort: Pulmonary effort is normal. No respiratory distress.     Breath sounds: Normal breath sounds. No  wheezing.  Abdominal:     General: Bowel sounds are normal. There is no distension.     Palpations: Abdomen is soft.     Tenderness: There is no abdominal tenderness.  Musculoskeletal:        General: Normal range of motion.     Cervical back: Normal range of motion and neck supple.  Skin:    General: Skin is warm and dry.  Neurological:     General: No focal deficit present.     Mental Status: She is alert and oriented to person, place, and time.     Cranial Nerves: No cranial nerve deficit.     Motor: No weakness.     Coordination: Coordination normal.    ED Results / Procedures / Treatments   Labs (all labs ordered are listed, but only abnormal results are displayed) Labs Reviewed  BASIC METABOLIC PANEL - Abnormal; Notable for the following components:      Result Value   Glucose, Bld 109 (*)    BUN 6 (*)    All other components within normal limits  URINALYSIS, ROUTINE W REFLEX MICROSCOPIC - Abnormal; Notable for the following components:   Color, Urine STRAW (*)    Specific Gravity, Urine 1.002 (*)    Leukocytes,Ua SMALL (*)    Bacteria, UA RARE (*)    All other components within normal limits  CBC WITH DIFFERENTIAL/PLATELET  TROPONIN I (HIGH SENSITIVITY)    EKG None  Radiology MR BRAIN WO CONTRAST  Result Date: 01/16/2022 CLINICAL DATA:  Transient ischemic attack. Additional history provided: Right arm numbness and pain. Headache. History of Bell's palsy and migraine. Chest pain. EXAM: MRI HEAD WITHOUT CONTRAST TECHNIQUE: Multiplanar, multiecho pulse sequences of the brain and surrounding structures were obtained without intravenous contrast. COMPARISON:  CT angiogram head/neck 01/16/2022. Head CT 01/15/2022. Brain MRI 03/12/2021. FINDINGS: Brain: Cerebral volume appears normal for age. Minimal multifocal T2 FLAIR hyperintense signal abnormality within the cerebral white matter, nonspecific but compatible with chronic small vessel ischemic disease. There is no acute  infarct. No evidence of an intracranial mass. No chronic intracranial blood products. No extra-axial fluid collection. No midline shift. Vascular: Maintained flow voids within the proximal large arterial vessels. Skull and upper cervical spine: No focal suspicious marrow lesion. Sinuses/Orbits: Visualized orbits show no acute finding. Small mucous retention cyst within the right maxillary sinus. Trace mucosal thickening within the bilateral ethmoid sinuses. Other: Small-volume fluid within the right mastoid air cells. IMPRESSION: No evidence of acute intracranial abnormality. Minimal chronic small-vessel ischemic changes within the cerebral white matter, stable from the brain MRI of 03/12/2021. Mild paranasal sinus disease, as described. Small-volume fluid within the right mastoid air cells. Electronically Signed   By: Kellie Simmering D.O.   On:  01/16/2022 08:44   ECHOCARDIOGRAM COMPLETE  Result Date: 01/16/2022    ECHOCARDIOGRAM REPORT   Patient Name:   OONA TRAMMEL Date of Exam: 01/16/2022 Medical Rec #:  [id number]       Height:       62.0 in Accession #:    [id number]      Weight:       120.0 lb Date of Birth:  Apr 24, 1941       BSA:          1.539 m Patient Age:    11 years        BP:           168/62 mmHg Patient Gender: F               HR:           67 bpm. Exam Location:  [facility] Procedure: 2D Echo, Color Doppler and Cardiac Doppler Indications:     G45.9 TIA  History:         Patient has no prior history of Echocardiogram examinations.                  Risk Factors:Dyslipidemia.  Sonographer:     Charmayne Sheer Referring Phys:  [id number] AMIN Diagnosing Phys: Ida Rogue MD  Sonographer Comments: Suboptimal parasternal window. IMPRESSIONS  1. Left ventricular ejection fraction, by estimation, is 60 to 65%. The left ventricle has normal function. The left ventricle has no regional wall motion abnormalities. There is mild left ventricular hypertrophy. Left ventricular diastolic parameters are consistent with  Grade I diastolic dysfunction (impaired relaxation).  2. Right ventricular systolic function is normal. The right ventricular size is normal.  3. The mitral valve is normal in structure. No evidence of mitral valve regurgitation. No evidence of mitral stenosis.  4. The aortic valve was not well visualized. Aortic valve regurgitation is not visualized. No aortic stenosis is present.  5. The inferior vena cava is normal in size with greater than 50% respiratory variability, suggesting right atrial pressure of 3 mmHg. FINDINGS  Left Ventricle: Left ventricular ejection fraction, by estimation, is 60 to 65%. The left ventricle has normal function. The left ventricle has no regional wall motion abnormalities. The left ventricular internal cavity size was normal in size. There is  mild left ventricular hypertrophy. Left ventricular diastolic parameters are consistent with Grade I diastolic dysfunction (impaired relaxation). Right Ventricle: The right ventricular size is normal. No increase in right ventricular wall thickness. Right ventricular systolic function is normal. Left Atrium: Left atrial size was normal in size. Right Atrium: Right atrial size was normal in size. Pericardium: There is no evidence of pericardial effusion. Mitral Valve: The mitral valve is normal in structure. No evidence of mitral valve regurgitation. No evidence of mitral valve stenosis. MV peak gradient, 5.1 mmHg. The mean mitral valve gradient is 2.0 mmHg. Tricuspid Valve: The tricuspid valve is normal in structure. Tricuspid valve regurgitation is not demonstrated. No evidence of tricuspid stenosis. Aortic Valve: The aortic valve was not well visualized. Aortic valve regurgitation is not visualized. No aortic stenosis is present. Aortic valve mean gradient measures 5.0 mmHg. Aortic valve peak gradient measures 8.0 mmHg. Aortic valve area, by VTI measures 1.53 cm. Pulmonic Valve: The pulmonic valve was normal in structure. Pulmonic valve  regurgitation is not visualized. No evidence of pulmonic stenosis. Aorta: The aortic root is normal in size and structure. Venous: The inferior vena cava is normal in size with greater than 50%  respiratory variability, suggesting right atrial pressure of 3 mmHg. IAS/Shunts: No atrial level shunt detected by color flow Doppler.  LEFT VENTRICLE PLAX 2D LVIDd:         3.44 cm   Diastology LVIDs:         2.02 cm   LV e' medial:    5.11 cm/s LV PW:         1.10 cm   LV E/e' medial:  15.4 LV IVS:        0.67 cm   LV e' lateral:   5.22 cm/s LVOT diam:     1.80 cm   LV E/e' lateral: 15.1 LV SV:         47 LV SV Index:   30 LVOT Area:     2.54 cm  RIGHT VENTRICLE RV Basal diam:  2.08 cm TAPSE (M-mode): 2.3 cm LEFT ATRIUM             Index        RIGHT ATRIUM          Index LA diam:        3.40 cm 2.21 cm/m   RA Area:     5.62 cm LA Vol (A2C):   22.7 ml 14.75 ml/m  RA Volume:   7.69 ml  5.00 ml/m LA Vol (A4C):   22.7 ml 14.75 ml/m LA Biplane Vol: 23.4 ml 15.21 ml/m  AORTIC VALVE                     PULMONIC VALVE AV Area (Vmax):    1.66 cm      PV Vmax:       0.85 m/s AV Area (Vmean):   1.64 cm      PV Vmean:      54.800 cm/s AV Area (VTI):     1.53 cm      PV VTI:        0.184 m AV Vmax:           141.00 cm/s   PV Peak grad:  2.9 mmHg AV Vmean:          104.000 cm/s  PV Mean grad:  1.0 mmHg AV VTI:            0.305 m AV Peak Grad:      8.0 mmHg AV Mean Grad:      5.0 mmHg LVOT Vmax:         92.00 cm/s LVOT Vmean:        67.200 cm/s LVOT VTI:          0.183 m LVOT/AV VTI ratio: 0.60  AORTA Ao Root diam: 2.60 cm MITRAL VALVE MV Area (PHT): 4.74 cm     SHUNTS MV Area VTI:   1.79 cm     Systemic VTI:  0.18 m MV Peak grad:  5.1 mmHg     Systemic Diam: 1.80 cm MV Mean grad:  2.0 mmHg MV Vmax:       1.13 m/s MV Vmean:      67.3 cm/s MV Decel Time: 160 msec MV E velocity: 78.80 cm/s MV A velocity: 115.00 cm/s MV E/A ratio:  0.69 Ida Rogue MD Electronically signed by Ida Rogue MD Signature Date/Time:  01/16/2022/2:29:55 PM    Final     Procedures Procedures    Medications Ordered in ED Medications - No data to display  ED Course/ Medical Decision Making/ A&P  This patient presents to the ED for concern  of elevated blood pressure, this involves an extensive number of treatment options, and is a complaint that carries with it a high risk of complications and morbidity.  The differential diagnosis includes hypertensive crisis, pheochromocytoma   Co morbidities that complicate the patient evaluation  None   Additional history obtained:  Additional history obtained from daughter present at bedside External records from outside source obtained and reviewed including discharge summary from [place]   Lab Tests:  I Ordered, and personally interpreted labs.  The pertinent results include: CBC, metabolic panel, and troponin, all of which are unremarkable   Imaging Studies ordered:  No imaging studies performed or indicated   Cardiac Monitoring:  The patient was maintained on a cardiac monitor.  I personally viewed and interpreted the cardiac monitored which showed an underlying rhythm of: None   Medicines ordered and prescription drug management:  No medications given in the ER I have reviewed the patients home medicines and have made adjustments as needed   Test Considered:  No other test considered or indicated   Critical Interventions:  None   Consultations Obtained:  None   Problem List / ED Course:  Patient presenting with elevated blood pressure.  By the time she arrived to the ER, her blood pressures were in the 160s.  She is completely asymptomatic with this and her EKG and laboratory studies showed no evidence for endorgan damage.  While examining the patient, I measured her blood pressure personally and it was 144/76.  At this point, I feel as though patient can safely be discharged.  Patient and daughter are both very concerned about her intermittent  spikes in blood pressure.  She is taking losartan, but these seem to be occurring anyway.  I have agreed to write clonidine tablets which she can take if her blood pressure spikes again.  She is to keep a record of her blood pressures and follow-up with primary doctor in the next week.   Reevaluation:  After the interventions noted above, I reevaluated the patient and found that they have :improved   Social Determinants of Health:  None   Dispostion:  After consideration of the diagnostic results and the patients response to treatment, I feel that the patent would benefit from discharged to home with clonidine and close follow-up with primary doctor.    Final Clinical Impression(s) / ED Diagnoses Final diagnoses:  None    Rx / DC Orders ED Discharge Orders     None         Veryl Speak, MD 01/18/22 [phone]

## 2022-01-20 ENCOUNTER — Ambulatory Visit (INDEPENDENT_AMBULATORY_CARE_PROVIDER_SITE_OTHER): Payer: Medicare HMO | Admitting: Internal Medicine

## 2022-01-20 ENCOUNTER — Encounter: Payer: Self-pay | Admitting: Internal Medicine

## 2022-01-20 ENCOUNTER — Telehealth: Payer: Self-pay | Admitting: Internal Medicine

## 2022-01-20 ENCOUNTER — Other Ambulatory Visit: Payer: Self-pay

## 2022-01-20 DIAGNOSIS — G3184 Mild cognitive impairment, so stated: Secondary | ICD-10-CM

## 2022-01-20 DIAGNOSIS — I700 Atherosclerosis of aorta: Secondary | ICD-10-CM | POA: Diagnosis not present

## 2022-01-20 DIAGNOSIS — I10 Essential (primary) hypertension: Secondary | ICD-10-CM | POA: Diagnosis not present

## 2022-01-20 NOTE — Assessment & Plan Note (Signed)
This was seen on CT imaging Given a single dose of crestor and had severe leg pain---will not give any other statins (this is not new)

## 2022-01-20 NOTE — Progress Notes (Signed)
Subjective:    Patient ID: [name], female    DOB: 11/19/1941, 81 y.o.   MRN: [id number]  HPI Here with daughter Antony Salmon for ER follow up  Started with sharp pain and numbness on left side--2/8 Ran hot water---did get better BP was up though---and it started going up (up to 191/70 something) Went to ER at [facility]--BP 226/96 Taken back to room--tests done. IV medication for BP given MRI brain normal CT angio of chest also negative (other than aortic atherosclerosis) Observed overnight in ER Given losartan the next morning Given crestor also--but soon developed leg cramps BP still high---given hydralazine then (this helped so she was sent home in the evening) Told to return if BP over 397 systolic  That night---go up and legs got tingling and numb  Went to knees and couldn't get up without help from husband Was very nervous then--got clorazepate and got back to sleep  Then BP rising again 2/10 Went to urgent care then---was told to go to ER again (but didn't want to go) [place] home but BP then going up again (673 systolic) Went to ER then at [facility] home with prn clonidine (if over 419 systolic) Hasn't used this  BP 160's at highest This morning--- 117/63  No recurrence of abnormal sensations No focal weakness No aphasia No dysphagia  Current Outpatient Medications on File Prior to Visit  Medication Sig Dispense Refill   aspirin EC 81 MG EC tablet Take 1 tablet (81 mg total) by mouth daily. Swallow whole. 30 tablet 11   baclofen (LIORESAL) 10 MG tablet Take 10 mg by mouth daily as needed. For headache     carbamide peroxide (DEBROX) 6.5 % OTIC solution Place 5 drops into the right ear daily as needed (for wax build up).     cloNIDine (CATAPRES) 0.1 MG tablet Take 1 tablet every 12 hours as needed for blood pressures greater than 379 systolic. 15 tablet 0   clorazepate (TRANXENE) 7.5 MG tablet Take 1 tablet (7.5 mg total) by mouth 2 (two) times daily as needed for  anxiety. 20 tablet 0   donepezil (ARICEPT) 5 MG tablet Take 1 tablet (5 mg total) by mouth at bedtime. 30 tablet 3   HYDROcodone bit-homatropine (HYCODAN) 5-1.5 MG/5ML syrup Take 5 mLs by mouth at bedtime as needed for cough. 120 mL 0   losartan (COZAAR) 25 MG tablet Take 1 tablet (25 mg total) by mouth daily. 30 tablet 1   Multiple Vitamin (MULTIVITAMIN) tablet Take 1 tablet by mouth daily.     Potassium Gluconate 550 MG TABS Take 550 mg by mouth 3 (three) times a week.     Probiotic Product (PROBIOTIC PO) Take 1 capsule by mouth daily.     vitamin B-12 (CYANOCOBALAMIN) 1000 MCG tablet Take 1,000 mcg by mouth daily.     zonisamide (ZONEGRAN) 100 MG capsule Take 100 mg by mouth daily.     No current facility-administered medications on file prior to visit.    Allergies  Allergen Reactions   Clonazepam Swelling and Other (See Comments)    REACTION: Swollen lips, sore mouth   Penicillins Other (See Comments)    Unrecalled reaction Has patient had a PCN reaction causing immediate rash, facial/tongue/throat swelling, SOB or lightheadedness with hypotension: Unknown Has patient had a PCN reaction causing severe rash involving mucus membranes or skin necrosis: Unknown Has patient had a PCN reaction that required hospitalization: Unknown Has patient had a PCN reaction occurring within the last 10  years: No If all of the above answers are "NO", then may proceed with Cephalosporin use.    Statins Other (See Comments)    Severe muscle pain    Past Medical History:  Diagnosis Date   Anxiety    Aortic atherosclerosis (HCC)    GERD (gastroesophageal reflux disease)    Dr. 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 Theodoro Kalata (hard of hearing)    uses hearing aid on left side/complete loss of hearing right side   IBS (irritable bowel syndrome)    LBBB  (left bundle branch block)    Migraine    OA (osteoarthritis)    Osteopenia 2005   Ulcer 2003   bleeding stomach ulcer    Past Surgical History:  Procedure Laterality Date   [place] N/A 01/20/2019   Procedure: DIAGNOSTIC LAP WITH SMALL BOWEL RESECTION;  Surgeon: Mickeal Skinner, MD;  Location: [facility];  Service: General;  Laterality: N/A;   CARDIAC CATHETERIZATION  1997   okay, mild changes   CHOLECYSTECTOMY  1992   COLONOSCOPY  2003   DEXA N/A 2005   borderline osteopenia   DIAGNOSTIC LAPAROSCOPY  11/25/2018   LAPAROSCOPY N/A 11/25/2018   Procedure: LAPAROSCOPY DIAGNOSTIC;  Surgeon: Mickeal Skinner, MD;  Location: [facility];  Service: General;  Laterality: N/A;   TONSILLECTOMY AND ADENOIDECTOMY  1953   UPPER GASTROINTESTINAL ENDOSCOPY  2003    Family History  Problem Relation Age of Onset   Heart attack Father 65   Diabetes Father    Coronary artery disease Father    COPD Mother    Hypertension Mother    Hypertension Sister    Pulmonary fibrosis Sister    Cancer Daughter        breast   Rheum arthritis Daughter    Colon cancer Neg Hx    Esophageal cancer Neg Hx    Rectal cancer Neg Hx     Social History   Socioeconomic History   Marital status: Married    Spouse name: Jori Moll   Number of children: 2   Years of education: Not on file   Highest education level: Not on file  Occupational History   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  Tobacco Use   Smoking status: Never   Smokeless tobacco: Never  Vaping Use   Vaping Use: Never used  Substance and Sexual Activity   Alcohol use: No   Drug use: No   Sexual activity: Not on file  Other Topics Concern   Not on file  Social History Narrative   Has living will   Husband, then daughters, are [place] health care POA   Would accept resuscitation attempts but no prolonged artifical ventilation   No prolonged tube feeds if cognitively  impaired   Social Determinants of Health   Financial Resource Strain: Not on file  Food Insecurity: Not on file  Transportation Needs: Not on file  Physical Activity: Not on file  Stress: Not on file  Social Connections: Not on file  Intimate Partner Violence: Not on file   Review of Systems No further doses of crestor Has not had further burning or leg pains now Eating okay Sleeping okay  Objective:   Physical Exam Constitutional:      Appearance: Normal appearance.  Cardiovascular:     Rate and Rhythm: Normal rate and regular rhythm.     Heart sounds: No murmur heard.   No gallop.  Pulmonary:     Effort: Pulmonary effort is normal.     Breath sounds: Normal breath sounds. No wheezing or rales.  Musculoskeletal:     Cervical back: Neck supple.     Right lower leg: No edema.     Left lower leg: No edema.  Lymphadenopathy:     Cervical: No cervical adenopathy.  Neurological:     Mental Status: She is alert.  Psychiatric:        Mood and Affect: Mood normal.        Behavior: Behavior normal.           Assessment & Plan:

## 2022-01-20 NOTE — Telephone Encounter (Signed)
[facility] - Client TELEPHONE ADVICE RECORD [facility] Patient Name: [name] Gender: Female DOB: [birth year] Age: 81 Y 22 M Return Phone Number: [phone] (Primary) Address: City/ State/ Zip: [place] [address] Client  [facility] - Client Client Site [facility] Provider Viviana Simpler- MD Contact Type Call Who Is Calling Patient / Member / Family / Caregiver Call Type Triage / Clinical Caller Name Cherie Trout Relationship To Patient Daughter Return Phone Number [phone] (Primary) Chief Complaint Blood Pressure High Reason for Call Symptomatic / Request for Health Information Initial Comment Mother was seen at ER Wednesday for high BP. She had tests run and sent home Thursday with meds (Losartan). BP is currently 174/78, no symptoms. Caller is not with the patient. Translation No Nurse Assessment Nurse: Isaac Bliss, RN, Monica Date/Time (Eastern Time): 01/17/2022 9:26:11 PM Confirm and document reason for call. If symptomatic, describe symptoms. ---caller states that mother previously went to ER on Wednesday because she was having cramping in her arms and was having episodes of high bp 226/85. Started her on losartan 25 mg as well as hydralazine while admitted. Today she followed up with cramping in her legs at an urgent care. Tonight she took her blood pressure pill at 2100 most recent bp 174/78 Does the patient have any new or worsening symptoms? ---Yes Will a triage be completed? ---Yes Related visit to physician within the last 2 weeks? ---Yes Does the PT have any chronic conditions? (i.e. diabetes, asthma, this includes High risk factors for pregnancy, etc.) ---Yes List chronic conditions. ---HTN Is this a behavioral health or substance abuse call? ---No Guidelines Guideline Title Affirmed Question Affirmed Notes Nurse Date/Time (Eastern Time) Blood Pressure  - High Systolic BP >= 093 OR Diastolic >= 818 Bogle, RN, Monica 01/17/2022 9:33:49 PM PLEASE NOTE: All timestamps contained within this report are represented as [place] Standard Time. CONFIDENTIALTY NOTICE: This fax transmission is intended only for the addressee. It contains information that is legally privileged, confidential or otherwise protected from use or disclosure. If you are not the intended recipient, you are strictly prohibited from reviewing, disclosing, copying using or disseminating any of this information or taking any action in reliance on or regarding this information. If you have received this fax in error, please notify us immediately by telephone so that we can arrange for its return to [place]. Phone: [phone], Toll-Free: [phone], Fax: [phone] Page: 2 of 2 Call Id: [id number] [place]. Time Eilene Ghazi Time) Disposition Final User 01/17/2022 9:50:19 PM Go to ED Now Yes Isaac Bliss, RN, Brayton Layman Disposition Overriden: SEE PCP WITHIN 3 DAYS Override Reason: Patients symptoms need a higher level of care Caller Disagree/Comply Comply Caller Understands Yes PreDisposition Did not know what to do Care Advice Given Per Guideline SEE PCP WITHIN 3 DAYS: HIGH BLOOD PRESSURE: * Untreated high blood pressure may cause damage to your heart, brain, kidneys, and eyes. * DECREASE SODIUM INTAKE: Aim to eat less than 2.4 g (100 mmol) of sodium each day. Unfortunately 75% of the salt in the average person's diet is in pre-processed foods. * REDUCE STRESS: Find activities that help reduce your stress. Examples might include meditation, yoga, or even a restful walk in a park. CALL BACK IF: GO TO ED NOW: * You need to be seen in the Emergency Department. * Go to the ED at ___________ [facility] 911 IF: * Passes out or faints * Becomes confused * Becomes too weak to stand  Referrals [place]

## 2022-01-20 NOTE — Assessment & Plan Note (Signed)
Didn't see any change with donepezil----and may have been associated with the BP elevations Will stop the donepezil

## 2022-01-20 NOTE — Telephone Encounter (Signed)
Pt already has appt scheduled with Dr Silvio Pate 01/20/22 at 4pm. Sending note to Dr Silvio Pate and Larene Beach CMA.

## 2022-01-20 NOTE — Assessment & Plan Note (Signed)
BP Readings from Last 3 Encounters:  01/20/22 130/78  01/17/22 (!) 167/75  01/17/22 (!) 167/75   Repeat by me 144/62 This is new for her On losartan 25 daily Will stop the clonidine --discussed concerns about rebound elevations Will stop the donepezil

## 2022-01-20 NOTE — Telephone Encounter (Signed)
[name] called in due to Ms. [name] went to the hospital Friday night and they told her to follow up with her PCP and they stated that the PCP needed to tweak her BP medicine.

## 2022-01-20 NOTE — Telephone Encounter (Signed)
Glad she has appt today Will check BP and decide on Rx then

## 2022-01-21 ENCOUNTER — Ambulatory Visit: Payer: Medicare HMO | Admitting: Internal Medicine

## 2022-01-22 ENCOUNTER — Ambulatory Visit: Payer: Medicare HMO | Admitting: Internal Medicine

## 2022-01-22 DIAGNOSIS — G43719 Chronic migraine without aura, intractable, without status migrainosus: Secondary | ICD-10-CM | POA: Diagnosis not present

## 2022-01-22 MED ORDER — LOSARTAN POTASSIUM 25 MG PO TABS: 25.0000 mg | ORAL_TABLET | Freq: Every day | ORAL | 3 refills | Status: DC

## 2022-01-23 ENCOUNTER — Encounter: Payer: Self-pay | Admitting: Family Medicine

## 2022-01-23 ENCOUNTER — Other Ambulatory Visit: Payer: Self-pay

## 2022-01-23 ENCOUNTER — Telehealth (INDEPENDENT_AMBULATORY_CARE_PROVIDER_SITE_OTHER): Payer: Medicare HMO | Admitting: Family Medicine

## 2022-01-23 VITALS — BP 170/81 | Temp 100.0°F | Ht 62.0 in | Wt 116.0 lb

## 2022-01-23 DIAGNOSIS — U071 COVID-19: Secondary | ICD-10-CM | POA: Insufficient documentation

## 2022-01-23 MED ORDER — NIRMATRELVIR/RITONAVIR (PAXLOVID)TABLET: 3.0000 | ORAL_TABLET | Freq: Two times a day (BID) | ORAL | 0 refills | Status: AC

## 2022-01-23 NOTE — Progress Notes (Signed)
VIRTUAL VISIT Due to national recommendations of social distancing due to [place] 19, a virtual visit is felt to be most appropriate for this patient at this time.   I connected with the patient on 01/23/22 at  9:00 AM EST by virtual telehealth platform and verified that I am speaking with the correct person using two identifiers.   I discussed the limitations, risks, security and privacy concerns of performing an evaluation and management service by  virtual telehealth platform and the availability of in person appointments. I also discussed with the patient that there may be a patient responsible charge related to this service. The patient expressed understanding and agreed to proceed.  Patient location: Home Provider Location: [facility] Participants: Eliezer Lofts and Dillard Essex   Chief Complaint  Patient presents with   Covid Positive    C/o body aches, cough, fever- max 101.6, SOB and HA.  Pos home COVID-01/22/21.     History of Present Illness:  81 year old female patient of Dr. Silvio Pate with history of HTN and TIA  presents with COVID infection.  Date of onset: 01/21/2022   She reports she started with  new onset fatigue, body aches and headache yesterday  She has congestion and headache in last several days.  She has mild cough,dry.  Cough not keeping her up at night. Fever max yesterday 101.6 F   Some SOB from nasal congestion. No wheeze.   She taken robitussin cough syrup as well as  ibuprofen 400 mg  ( not supposed to take tylenol per pt).   She has history of migraine .. has had some, baclofen helped.   She is drinking water regularly,  she has normal UOP.   No history of chronic lung disease.   COVID 19 screen COVID testing: home test 01/22/2022 COVID vaccine:   03/20/2021 , 09/11/2020 , 01/13/2020 , 12/23/2019  Pfizer Covid-19 Vaccine Bivalent Booster 09/05/2021   COVID exposure: No recent travel or known exposure to [place]  The importance of social  distancing was discussed today.    Review of Systems  Constitutional:  Positive for chills, fever and malaise/fatigue.  HENT:  Positive for congestion. Negative for ear pain.   Eyes:  Negative for pain and redness.  Respiratory:  Positive for cough and shortness of breath. Negative for wheezing.   Cardiovascular:  Negative for chest pain, palpitations and leg swelling.  Gastrointestinal:  Negative for abdominal pain, blood in stool, constipation, diarrhea, nausea and vomiting.  Genitourinary:  Negative for dysuria.  Musculoskeletal:  Positive for myalgias. Negative for falls.  Skin:  Negative for rash.  Neurological:  Negative for dizziness.  Psychiatric/Behavioral:  Negative for depression. The patient is not nervous/anxious.      Past Medical History:  Diagnosis Date   Anxiety    Aortic atherosclerosis (HCC)    GERD (gastroesophageal reflux disease)    Dr. 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 Theodoro Kalata (hard of hearing)    uses hearing aid on left side/complete loss of hearing right side   IBS (irritable bowel syndrome)    LBBB (left bundle branch block)    Migraine    OA (osteoarthritis)    Osteopenia 2005   Ulcer 2003   bleeding stomach ulcer  reports that she has never smoked. She has never used smokeless tobacco. She reports that she does not drink alcohol and does not use drugs.   Current Outpatient Medications:    aspirin EC 81 MG EC tablet, Take 1 tablet (81 mg total) by mouth daily. Swallow whole., Disp: 30 tablet, Rfl: 11   baclofen (LIORESAL) 10 MG tablet, Take 10 mg by mouth daily as needed. For headache, Disp: , Rfl:    carbamide peroxide (DEBROX) 6.5 % OTIC solution, Place 5 drops into the right ear daily as needed (for wax build up)., Disp: , Rfl:    clorazepate (TRANXENE) 7.5 MG tablet, Take 1 tablet (7.5 mg total)  by mouth 2 (two) times daily as needed for anxiety., Disp: 20 tablet, Rfl: 0   losartan (COZAAR) 25 MG tablet, Take 1 tablet (25 mg total) by mouth daily., Disp: 90 tablet, Rfl: 3   Multiple Vitamin (MULTIVITAMIN) tablet, Take 1 tablet by mouth daily., Disp: , Rfl:    Potassium Gluconate 550 MG TABS, Take 550 mg by mouth 3 (three) times a week., Disp: , Rfl:    Probiotic Product (PROBIOTIC PO), Take 1 capsule by mouth daily., Disp: , Rfl:    vitamin B-12 (CYANOCOBALAMIN) 1000 MCG tablet, Take 1,000 mcg by mouth daily., Disp: , Rfl:    zonisamide (ZONEGRAN) 100 MG capsule, Take 100 mg by mouth daily., Disp: , Rfl:    Observations/Objective: Blood pressure (!) 170/81, temperature 100 F (37.8 C), height 5' 2"  (1.575 m), weight 116 lb (52.6 kg).  Physical Exam  Physical Exam Constitutional:      General: The patient is not in acute distress. Pulmonary:     Effort: Pulmonary effort is normal. No respiratory distress.  Neurological:     Mental Status: The patient is alert and oriented to person, place, and time.  Psychiatric:        Mood and Affect: Mood normal.        Behavior: Behavior normal.   Assessment and Plan Problem List Items Addressed This Visit     COVID-19 - Primary    COVID19  Infection < 5 days from onset of symptoms in 5 x vaccinated overweight individual with history of  HTN, TIA and advanced age.  No clear sign of bacterial infection at this time.   No SOB.  No red flags/need for ER visit or in-person exam at respiratory clinic at this time..    Pt higher risk for COVID complications given HTN, TIA and advanced age. GFR  >60 and no medication contraindications ( she rarely uses clorazepate and will continue to hold this while on Paxlovid..  Start paxlovid 5 day course. Reviewed course of medication and side effect profile with patient in detail.   Symptomatic care with mucinex and cough suppressant at night. If SOB begins symptoms worsening.. have low threshold for  in-person exam, if severe shortness of breath ER visit recommended.  Can monitor Oxygen saturation at home with home monitor if able to obtain.  Go to ER if O2 sat < 90% on room air.   Reviewed home care and provided information through [place].  Recommended quarantine 5 days isolation recommended. Return to work day 6 and wear mask for 4 more days to complete 10 days. Provided info about prevention of spread of COVID 19.       Relevant Medications   nirmatrelvir/ritonavir EUA (PAXLOVID) 20 x 150 MG & 10 x 100MG TABS      I discussed the assessment and  treatment plan with the patient. The patient was provided an opportunity to ask questions and all were answered. The patient agreed with the plan and demonstrated an understanding of the instructions.   The patient was advised to call back or seek an in-person evaluation if the symptoms worsen or if the condition fails to improve as anticipated.     Eliezer Lofts, MD

## 2022-01-23 NOTE — Assessment & Plan Note (Signed)
COVID19  Infection < 5 days from onset of symptoms in 5 x vaccinated overweight individual with history of  HTN, TIA and advanced age.  No clear sign of bacterial infection at this time.   No SOB.  No red flags/need for ER visit or in-person exam at respiratory clinic at this time..    Pt higher risk for COVID complications given HTN, TIA and advanced age. GFR  >60 and no medication contraindications ( she rarely uses clorazepate and will continue to hold this while on Paxlovid..  Start paxlovid 5 day course. Reviewed course of medication and side effect profile with patient in detail.   Symptomatic care with mucinex and cough suppressant at night. If SOB begins symptoms worsening.. have low threshold for in-person exam, if severe shortness of breath ER visit recommended.  Can monitor Oxygen saturation at home with home monitor if able to obtain.  Go to ER if O2 sat < 90% on room air.   Reviewed home care and provided information through [place].  Recommended quarantine 5 days isolation recommended. Return to work day 6 and wear mask for 4 more days to complete 10 days. Provided info about prevention of spread of COVID 19.

## 2022-02-03 ENCOUNTER — Other Ambulatory Visit: Payer: Self-pay

## 2022-02-03 ENCOUNTER — Ambulatory Visit
Admission: RE | Admit: 2022-02-03 | Discharge: 2022-02-03 | Disposition: A | Discharge status: Home / Self Care | Payer: Medicare HMO | Source: Ambulatory Visit | Attending: Internal Medicine | Admitting: Internal Medicine

## 2022-02-03 VITALS — BP 153/81 | HR 86 | Temp 98.8°F | Resp 20

## 2022-02-03 DIAGNOSIS — M940 Chondrocostal junction syndrome [Tietze]: Secondary | ICD-10-CM

## 2022-02-03 MED ORDER — MELOXICAM 7.5 MG PO TABS: 7.5000 mg | ORAL_TABLET | Freq: Every day | ORAL | 0 refills | Status: DC

## 2022-02-03 NOTE — Discharge Instructions (Addendum)
Do not take Ibuprofen while taking the Mobic pain and anti-inflammation medication I sent today

## 2022-02-03 NOTE — ED Triage Notes (Signed)
Pt COVID + on 2/19 and has had a strong and nagging, dry cough since then. Yesterday she started to have midline chest pain with each cough and with movement than went through to her back. Pt reports this feels like when she had pleurisy several years ago.

## 2022-02-03 NOTE — ED Provider Notes (Signed)
[facility]    CSN: [id number] Arrival date & time: 02/03/22  1351      History   Chief Complaint Chief Complaint  Patient presents with   Chest Pain   Cough    HPI [name] is a 81 y.o. female who presents with non productive cough since diagnosed with covid 2/19 Yesterday she started to have midline chest pain with each cough and with movement that went through her back. Had pleurisy mid January and this pain reminds her of that Denies fever, chills or sweats. She is taking the Covid antiviral medication.  She took Ibuprofen this am and seemed to help the chest wall pain.   Past Medical History:  Diagnosis Date   Anxiety    Aortic atherosclerosis ([place])    GERD (gastroesophageal reflux disease)    Dr. 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 Theodoro Kalata (hard of hearing)    uses hearing aid on left side/complete loss of hearing right side   IBS (irritable bowel syndrome)    LBBB (left bundle branch block)    Migraine    OA (osteoarthritis)    Osteopenia 2005   Ulcer 2003   bleeding stomach ulcer    Patient Active Problem List   Diagnosis Date Noted   COVID-19 01/23/2022   Essential hypertension, benign 01/20/2022   TIA (transient ischemic attack) 01/16/2022   Cough 12/17/2021   Costochondritis, acute 12/17/2021   Mild cognitive impairment 07/13/2019   Crohn's disease ([place]) 03/31/2019   Tremor 03/31/2019   Aortic atherosclerosis ([place])    Advance directive discussed with patient 06/05/2016   Vitamin B12 deficiency 10/15/2012   Migraine syndrome 04/16/2012   Routine general medical examination at a health care facility 04/16/2011   OSTEOARTHRITIS 07/20/2007   Hyperlipemia 07/08/2007   Mood disorder ([place]) 07/08/2007   GERD 07/08/2007   IBS 07/08/2007   OSTEOPENIA 07/08/2007    Past Surgical History:   Procedure Laterality Date   [place]   BOWEL RESECTION N/A 01/20/2019   Procedure: DIAGNOSTIC LAP WITH SMALL BOWEL RESECTION;  Surgeon: Mickeal Skinner, MD;  Location: [facility];  Service: General;  Laterality: N/A;   CARDIAC CATHETERIZATION  1997   okay, mild changes   [place]  2003   DEXA N/A 2005   borderline osteopenia   DIAGNOSTIC LAPAROSCOPY  11/25/2018   LAPAROSCOPY N/A 11/25/2018   Procedure: LAPAROSCOPY DIAGNOSTIC;  Surgeon: Mickeal Skinner, MD;  Location: [facility];  Service: General;  Laterality: N/A;   [place] ENDOSCOPY  2003    OB History   No obstetric history on file.      Home Medications    Prior to Admission medications   Medication Sig Start Date End Date Taking? Authorizing Provider  meloxicam (MOBIC) 7.5 MG tablet Take 1 tablet (7.5 mg total) by mouth daily. 02/03/22  Yes Rodriguez-Southworth, Sandrea Matte  aspirin EC 81 MG EC tablet Take 1 tablet (81 mg total) by mouth daily. Swallow whole. 01/17/22   Lorella Nimrod, MD  baclofen (LIORESAL) 10 MG tablet Take 10 mg by mouth daily as needed. For headache    [provider]  carbamide  peroxide (DEBROX) 6.5 % OTIC solution Place 5 drops into the right ear daily as needed (for wax build up).    [provider]  clorazepate (TRANXENE) 7.5 MG tablet Take 1 tablet (7.5 mg total) by mouth 2 (two) times daily as needed for anxiety. 11/29/21   Venia Carbon, MD  losartan (COZAAR) 25 MG tablet Take 1 tablet (25 mg total) by mouth daily. 01/22/22   Venia Carbon, MD  Multiple Vitamin (MULTIVITAMIN) tablet Take 1 tablet by mouth daily.    [provider]  Potassium Gluconate 550 MG TABS Take 550 mg by mouth 3 (three) times a week.    [provider]  Probiotic Product (PROBIOTIC PO) Take 1 capsule by mouth daily.    [provider]  vitamin B-12  (CYANOCOBALAMIN) 1000 MCG tablet Take 1,000 mcg by mouth daily.    [provider]  zonisamide (ZONEGRAN) 100 MG capsule Take 100 mg by mouth daily.    [provider]    Family History Family History  Problem Relation Age of Onset   Heart attack Father 37   Diabetes Father    Coronary artery disease Father    COPD Mother    Hypertension Mother    Hypertension Sister    Pulmonary fibrosis Sister    Cancer Daughter        breast   Rheum arthritis Daughter    Colon cancer Neg Hx    Esophageal cancer Neg Hx    Rectal cancer Neg Hx     Social History Social History   Tobacco Use   Smoking status: Never   Smokeless tobacco: Never  Vaping Use   Vaping Use: Never used  Substance Use Topics   Alcohol use: No   Drug use: No     Allergies   Clonazepam, Penicillins, and Statins   Review of Systems Review of Systems  Constitutional:  Positive for fatigue. Negative for activity change, appetite change and diaphoresis.  HENT:  Negative for congestion, postnasal drip and rhinorrhea.   Eyes:  Negative for discharge.  Respiratory:  Positive for cough. Negative for chest tightness and shortness of breath.        + chest and back soreness  Musculoskeletal:  Negative for gait problem and myalgias.  Skin:  Negative for rash.    Physical Exam Triage Vital Signs ED Triage Vitals  Enc Vitals Group     BP 02/03/22 1423 (!) 153/81     Pulse Rate 02/03/22 1423 86     Resp 02/03/22 1423 20     Temp 02/03/22 1423 98.8 F (37.1 C)     Temp src --      SpO2 02/03/22 1423 99 %     Weight --      Height --      Head Circumference --      Peak Flow --      Pain Score 02/03/22 1425 6     Pain Loc --      Pain Edu? --      Excl. in [place]? --    No data found.  Updated Vital Signs BP (!) 153/81    Pulse 86    Temp 98.8 F (37.1 C)    Resp 20    SpO2 99%   Visual Acuity Right Eye Distance:   Left Eye Distance:   Bilateral Distance:    Right Eye Near:   Left  Eye Near:    Bilateral Near:  Physical Exam Vitals and nursing note reviewed.  Constitutional:      General: She is not in acute distress. HENT:     Right Ear: External ear normal.     Left Ear: External ear normal.     Ears:     Comments: She is very hard of hearing  Eyes:     General: No scleral icterus.    Conjunctiva/sclera: Conjunctivae normal.  Cardiovascular:     Rate and Rhythm: Normal rate and regular rhythm.  Pulmonary:     Effort: Pulmonary effort is normal.     Breath sounds: Normal breath sounds. No rhonchi or rales.     Comments: She is tender on R upper mid rib area and R upper thoracic rib area. There are no crepitations.  Chest:     Chest wall: Tenderness present.  Musculoskeletal:        General: Normal range of motion.     Cervical back: Normal range of motion and neck supple.  Skin:    General: Skin is warm and dry.     Findings: No rash.  Neurological:     Mental Status: She is alert and oriented to person, place, and time.     Gait: Gait normal.  Psychiatric:        Mood and Affect: Mood normal.        Behavior: Behavior normal.        Thought Content: Thought content normal.        Judgment: Judgment normal.     UC Treatments / Results  Labs (all labs ordered are listed, but only abnormal results are displayed) Labs Reviewed - No data to display  EKG   Radiology No results found.  Procedures Procedures (including critical care time)  Medications Ordered in UC Medications - No data to display  Initial Impression / Assessment and Plan / UC Course  I have reviewed the triage vital signs and the nursing notes. Pertinent labs & imaging results that were available during my care of the patient were reviewed by me and considered in my medical decision making (see chart for details). Costochondritis I placed her on Mobic as noted    Final Clinical Impressions(s) / UC Diagnoses   Final diagnoses:  Costochondritis     Discharge  Instructions      Do not take Ibuprofen while taking the Mobic pain and anti-inflammation medication I sent today     ED Prescriptions     Medication Sig Dispense Auth. Provider   meloxicam (MOBIC) 7.5 MG tablet Take 1 tablet (7.5 mg total) by mouth daily. 7 tablet Rodriguez-Southworth, Sunday Spillers, PA-C      PDMP not reviewed this encounter.   Shelby Mattocks, [place] 02/03/22 1514

## 2022-02-17 ENCOUNTER — Encounter: Payer: Self-pay | Admitting: Internal Medicine

## 2022-02-17 ENCOUNTER — Ambulatory Visit (INDEPENDENT_AMBULATORY_CARE_PROVIDER_SITE_OTHER): Payer: Medicare HMO | Admitting: Internal Medicine

## 2022-02-17 ENCOUNTER — Other Ambulatory Visit: Payer: Self-pay

## 2022-02-17 DIAGNOSIS — I10 Essential (primary) hypertension: Secondary | ICD-10-CM

## 2022-02-17 NOTE — Progress Notes (Signed)
? ?Subjective:  ? ? Patient ID: [name], female    DOB: [birth year], 81 y.o.   MRN: [id number] ? ?HPI ?Subjective [] Expand by Default ? Patient ID: [name], female    DOB: 11/30/1941, 81 y.o.   MRN: [id number] ?  ?HPI ?Here for follow up of HTN ?With daughter Antony Salmon ?  ?Has checked her BP at home ?Systolic BPs [id number] ?  ?No chest pain or SOB ?Still has slight cough ?No ankle swelling ?No palpitations ?  ?Memory issues are not changed off the donepezil ?  ?      ?Current Outpatient Medications on File Prior to Visit  ?Medication Sig Dispense Refill  ? aspirin EC 81 MG EC tablet Take 1 tablet (81 mg total) by mouth daily. Swallow whole. 30 tablet 11  ? baclofen (LIORESAL) 10 MG tablet Take 10 mg by mouth daily as needed. For headache      ? carbamide peroxide (DEBROX) 6.5 % OTIC solution Place 5 drops into the right ear daily as needed (for wax build up).      ? clorazepate (TRANXENE) 7.5 MG tablet Take 1 tablet (7.5 mg total) by mouth 2 (two) times daily as needed for anxiety. 20 tablet 0  ? losartan (COZAAR) 25 MG tablet Take 1 tablet (25 mg total) by mouth daily. 90 tablet 3  ? Multiple Vitamin (MULTIVITAMIN) tablet Take 1 tablet by mouth daily.      ? Potassium Gluconate 550 MG TABS Take 1,100 mg by mouth daily.      ? Probiotic Product (PROBIOTIC PO) Take 1 capsule by mouth daily.      ? vitamin B-12 (CYANOCOBALAMIN) 1000 MCG tablet Take 1,000 mcg by mouth daily.      ? zonisamide (ZONEGRAN) 100 MG capsule Take 100 mg by mouth daily.      ?  ?No current facility-administered medications on file prior to visit.  ?  ?  ?     ?Allergies  ?Allergen Reactions  ? Clonazepam Swelling and Other (See Comments)  ?    REACTION: Swollen lips, sore mouth  ? Penicillins Other (See Comments)  ?    Unrecalled reaction ?Has patient had a PCN reaction causing immediate rash, facial/tongue/throat swelling, SOB or lightheadedness with hypotension: Unknown ?Has patient had a PCN reaction causing severe rash  involving mucus membranes or skin necrosis: Unknown ?Has patient had a PCN reaction that required hospitalization: Unknown ?Has patient had a PCN reaction occurring within the last 10 years: No ?If all of the above answers are "NO", then may proceed with Cephalosporin use. ?   ? Statins Other (See Comments)  ?    Severe muscle pain  ?  ?  ?    ?Past Medical History:  ?Diagnosis Date  ? Anxiety    ? Aortic atherosclerosis ([place])    ? GERD (gastroesophageal reflux disease)    ?  Dr. [name]  ? H/O abnormal weight loss    ?  lost over 20 lbs in the last year  ? Hard of hearing    ?  deaf in Right ear  ? History of Bell's palsy 02/2011  ? HLD (hyperlipidemia)    ?  Dr. Lia Foyer  ? HOH (hard of hearing)    ?  uses hearing aid on left side/complete loss of hearing right side  ? IBS (irritable bowel syndrome)    ? LBBB (left bundle branch block)    ? Migraine    ? OA (osteoarthritis)    ?  Osteopenia 2005  ? Ulcer 2003  ?  bleeding stomach ulcer  ?  ?  ?     ?Past Surgical History:  ?Procedure Laterality Date  ? ABDOMINAL HYSTERECTOMY   1983  ? APPENDECTOMY   1983  ? BOWEL RESECTION N/A 01/20/2019  ?  Procedure: DIAGNOSTIC LAP WITH SMALL BOWEL RESECTION;  Surgeon: Kinsinger, Arta Bruce, MD;  Location: [facility];  Service: General;  Laterality: N/A;  ? Alligator  ?  okay, mild changes  ? CHOLECYSTECTOMY   1992  ? COLONOSCOPY   2003  ? DEXA N/A 2005  ?  borderline osteopenia  ? DIAGNOSTIC LAPAROSCOPY   11/25/2018  ? LAPAROSCOPY N/A 11/25/2018  ?  Procedure: LAPAROSCOPY DIAGNOSTIC;  Surgeon: Kieth Brightly Arta Bruce, MD;  Location: [facility];  Service: General;  Laterality: N/A;  ? Welcome  ? UPPER GASTROINTESTINAL ENDOSCOPY   2003  ?  ?  ?     ?Family History  ?Problem Relation Age of Onset  ? Heart attack Father 11  ? Diabetes Father    ? Coronary artery disease Father    ? COPD Mother    ? Hypertension Mother    ? Hypertension Sister    ? Pulmonary fibrosis Sister    ? Cancer Daughter     ?      breast  ? Rheum arthritis Daughter    ? Colon cancer Neg Hx    ? Esophageal cancer Neg Hx    ? Rectal cancer Neg Hx    ?  ?  ?Social History  ?  ?     ?Socioeconomic History  ? Marital status: Married  ?    Spouse name: Jori Moll  ? Number of children: 2  ? Years of education: Not on file  ? Highest education level: Not on file  ?Occupational History  ? Occupation: Retired Medical sales representative and retired from [employer]  ?Tobacco Use  ? Smoking status: Never  ? Smokeless tobacco: Never  ?Vaping Use  ? Vaping Use: Never used  ?Substance and Sexual Activity  ? Alcohol use: No  ? Drug use: No  ? Sexual activity: Not on file  ?Other Topics Concern  ? Not on file  ?Social History Narrative  ?  Has living will  ?  Husband, then daughters, are [place] health care POA  ?  Would accept resuscitation attempts but no prolonged artifical ventilation  ?  No prolonged tube feeds if cognitively impaired  ?  ?Social Determinants of Health  ?  ?Financial Resource Strain: Not on file  ?Food Insecurity: Not on file  ?Transportation Needs: Not on file  ?Physical Activity: Not on file  ?Stress: Not on file  ?Social Connections: Not on file  ?Intimate Partner Violence: Not on file  ?  ?Review of Systems ?Sleeping okay ?Appetite is small--but stable ?Weight stable ?  ?  ?  ?Objective:  ?  ?Objective  ? Physical Exam ?Constitutional:   ?   Appearance: Normal appearance.  ?Cardiovascular:  ?   Rate and Rhythm: Normal rate and regular rhythm.  ?   Heart sounds: No murmur heard. ?  No gallop.  ?Pulmonary:  ?   Effort: Pulmonary effort is normal.  ?   Breath sounds: Normal breath sounds. No wheezing or rales.  ?Musculoskeletal:  ?   Cervical back: Neck supple.  ?   Right lower leg: No edema.  ?   Left lower leg: No edema.  ?Lymphadenopathy:  ?  Cervical: No cervical adenopathy.  ?Neurological:  ?   Mental Status: She is alert.  ?  ?  ?  ?  ?  ?  ?  ?Assessment & Plan:  ?   ?  ? ? ? ?Review of Systems ? ?   ?Objective:  ? Physical Exam  ? ? ? ? ?    ?Assessment & Plan:  ? ?

## 2022-02-17 NOTE — Assessment & Plan Note (Signed)
BP Readings from Last 3 Encounters:  ?02/17/22 138/86  ?02/03/22 (!) 153/81  ?01/23/22 (!) 170/81  ? ?Repeat 138/80 by me on right ?Doing okay on losartan 84m--though still some higher numbers at home ?I am hesitant to increase the medications for fear of overtreatment ?They will monitor at home ?

## 2022-02-24 ENCOUNTER — Other Ambulatory Visit: Payer: Self-pay | Admitting: Ophthalmology

## 2022-02-24 DIAGNOSIS — H4912 Fourth [trochlear] nerve palsy, left eye: Secondary | ICD-10-CM | POA: Diagnosis not present

## 2022-03-05 ENCOUNTER — Ambulatory Visit
Admission: RE | Admit: 2022-03-05 | Discharge: 2022-03-05 | Disposition: A | Discharge status: Home / Self Care | Payer: Medicare HMO | Source: Ambulatory Visit | Attending: Ophthalmology | Admitting: Ophthalmology

## 2022-03-05 DIAGNOSIS — R519 Headache, unspecified: Secondary | ICD-10-CM | POA: Diagnosis not present

## 2022-03-05 DIAGNOSIS — H4912 Fourth [trochlear] nerve palsy, left eye: Secondary | ICD-10-CM | POA: Insufficient documentation

## 2022-03-05 DIAGNOSIS — H532 Diplopia: Secondary | ICD-10-CM | POA: Diagnosis not present

## 2022-03-05 IMAGING — MR MR HEAD WO/W CM
15 series · 48 of 48 positions shown · IV contrast (gadavist)
Comparison: Brain MRI [DATE]. Head CT [DATE]. CT angiogram
head/neck [DATE].

CLINICAL DATA: Provided history: Cranial nerve IV nerve palsy, left
eye. Additional history provided by scanning technologist: Double
vision, memory loss/changes and headaches for 1 month.

EXAM:
MRI HEAD WITHOUT AND WITH CONTRAST
TECHNIQUE: Multiplanar, multiecho pulse sequences of the brain and surrounding
structures were obtained without and with intravenous contrast.
CONTRAST:  5mL GADAVIST GADOBUTROL 1 MMOL/ML IV SOLN

[Series 5: ax dwi_tracew · axial · 3.0mm · 0.71mm/px · z∈[-80,+84]mm · 5 of 112 slices shown]
[im 1/112]
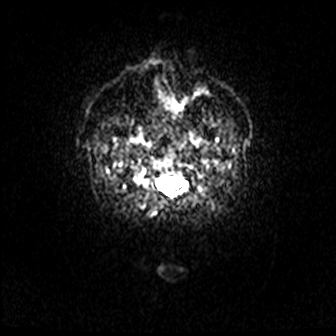
[im 28/112]
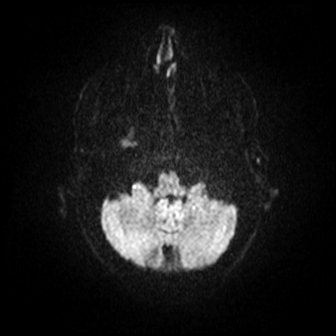
[im 56/112]
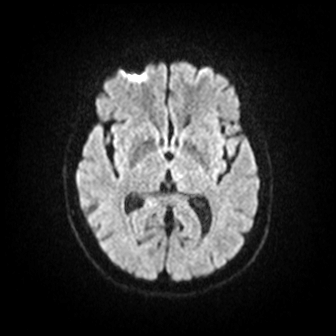
[im 84/112]
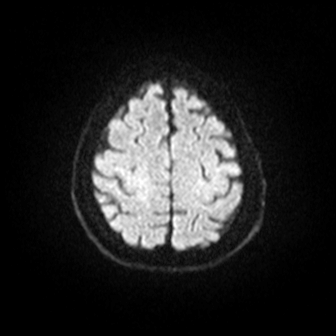
[im 112/112]
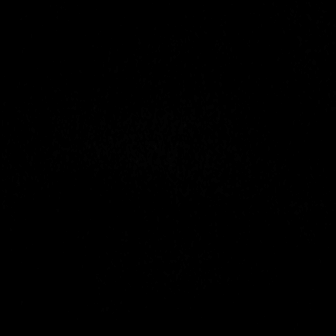

[Series 6: ax dwi_adc · axial · 3.0mm · 0.71mm/px · z∈[-80,+78]mm · 3 of 54 slices shown]
[im 1/54]
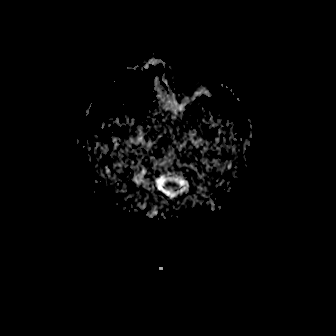
[im 27/54]
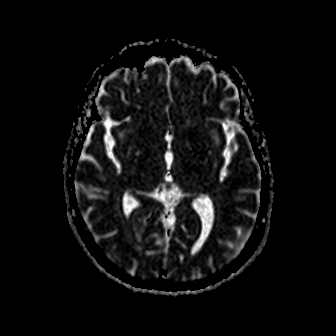
[im 54/54]
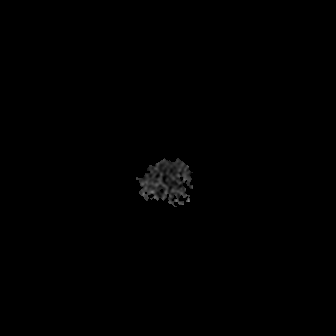

[Series 7: cor dwi_tracew · coronal · 5.0mm · 0.68mm/px · 2 of 40 slices shown]
[im 1/40]
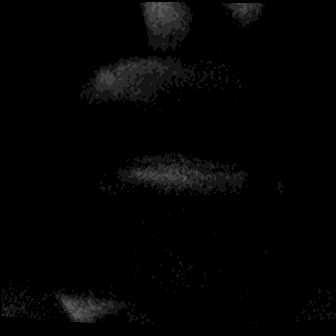
[im 40/40]
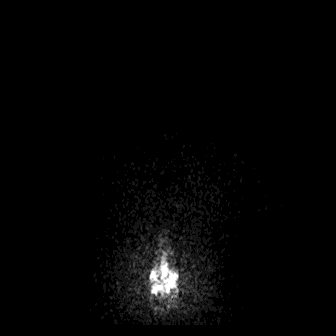

[Series 8: cor dwi_adc · coronal · 5.0mm · 0.68mm/px · 2 of 39 slices shown]
[im 1/39]
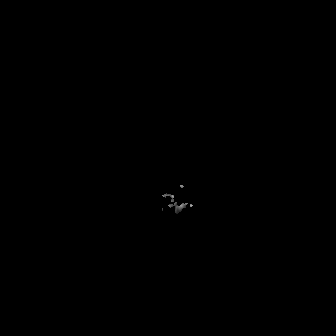
[im 39/39]
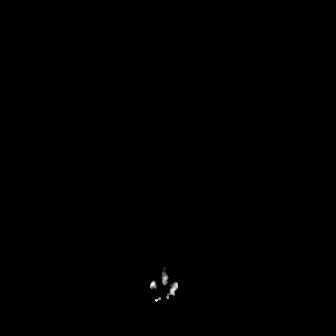

[Series 9: T1 · sagittal · 5.0mm · 0.62mm/px · 1 of 24 slices shown (1 of 2)]
[im 1/24]
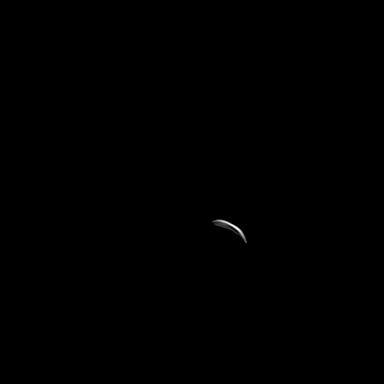

[Series 10: T2 · axial · 5.0mm · 0.53mm/px · 1 of 25 slices shown]
[im 1/25]
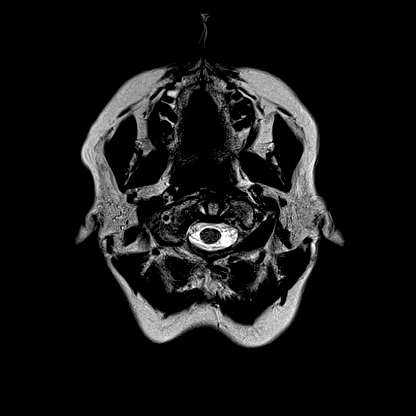

[Series 11: ax swi_mag · axial · 2.0mm · 0.90mm/px · z∈[-77,+80]mm · 4 of 80 slices shown]
[im 1/80]
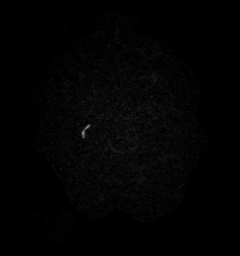
[im 27/80]
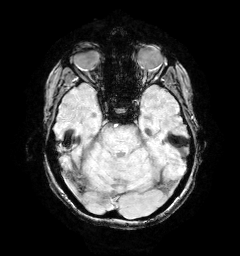
[im 53/80]
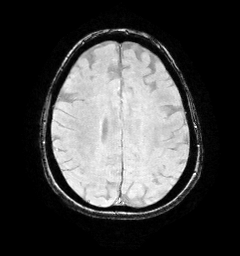
[im 80/80]
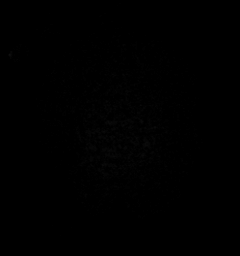

[Series 12: ax swi_pha · axial · 2.0mm · 0.90mm/px · z∈[-77,+80]mm · 4 of 80 slices shown]
[im 1/80]
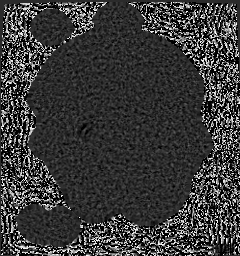
[im 27/80]
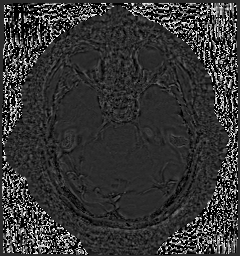
[im 53/80]
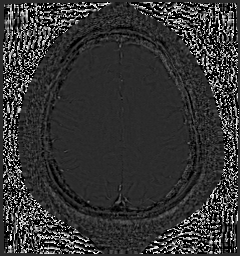
[im 80/80]
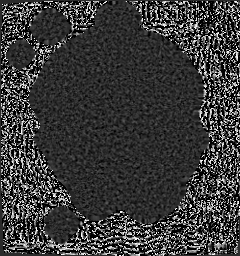

[Series 13: ax swi_swi · axial · 2.0mm · 0.90mm/px · z∈[-77,+80]mm · 4 of 80 slices shown]
[im 1/80]
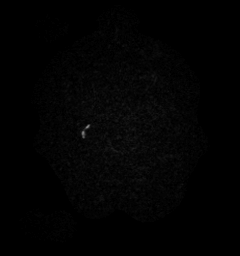
[im 27/80]
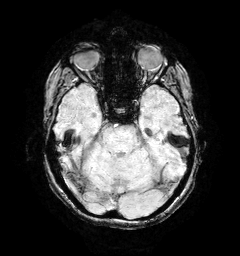
[im 53/80]
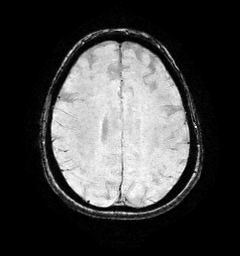
[im 80/80]
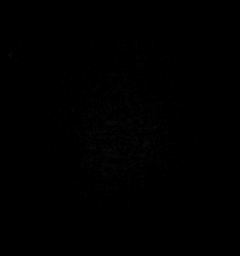

[Series 15: FLAIR · axial · 3.0mm · 0.53mm/px · z∈[-79,+81]mm · 3 of 55 slices shown]
[im 1/55]
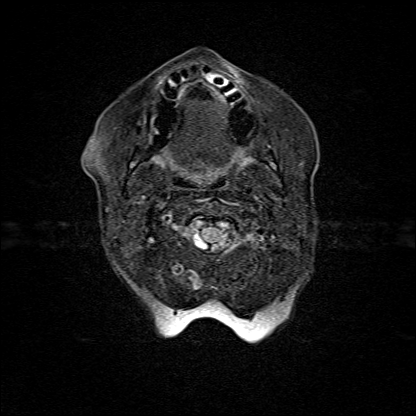
[im 28/55]
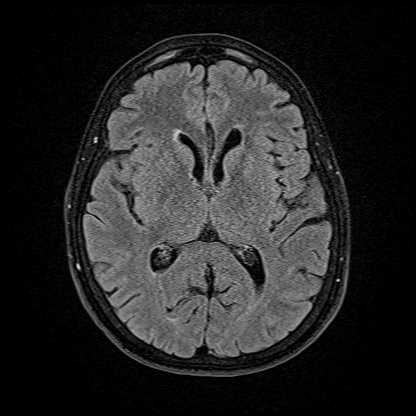
[im 55/55]
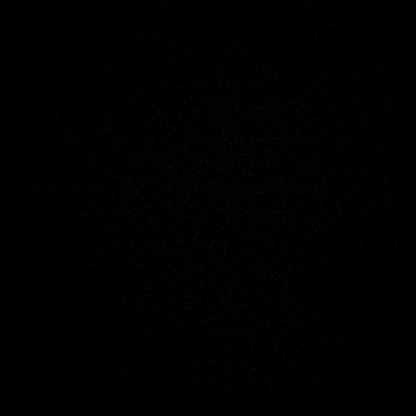

[Series 16: T1 · axial · 1.0mm · 0.98mm/px · z∈[-84,+89]mm · 8 of 176 slices shown (2 of 2)]
[im 1/176]
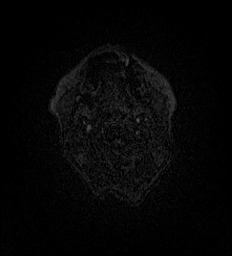
[im 26/176]
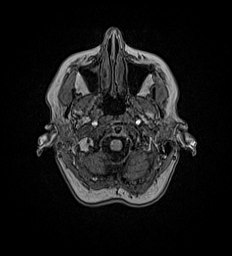
[im 51/176]
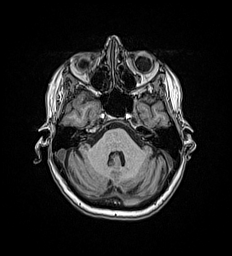
[im 76/176]
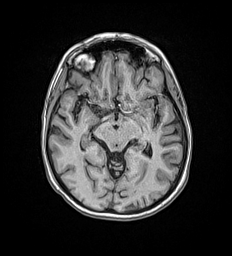
[im 101/176]
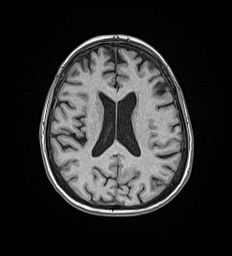
[im 126/176]
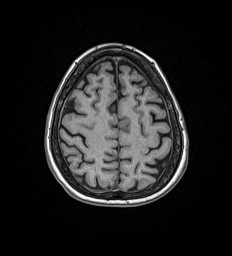
[im 151/176]
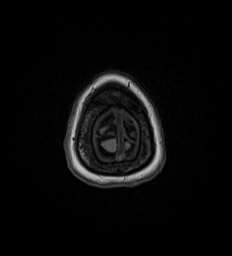
[im 176/176]
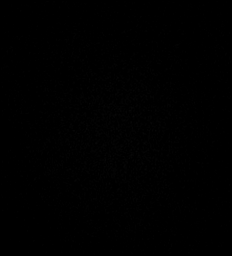

[Series 17: T2 post-contrast · coronal · 5.0mm · 0.57mm/px · 1 of 29 slices shown]
[im 1/29]
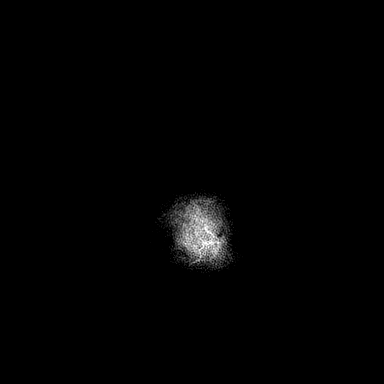

[Series 18: T1 post-contrast · axial · 1.0mm · 0.98mm/px · z∈[-84,+89]mm · 8 of 176 slices shown (1 of 3)]
[im 1/176]
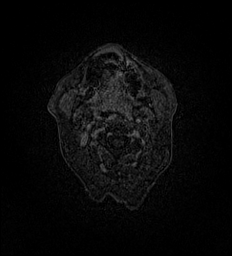
[im 26/176]
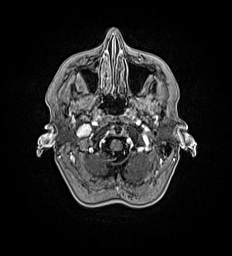
[im 51/176]
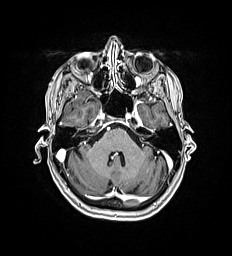
[im 76/176]
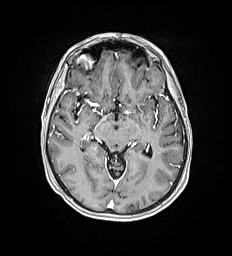
[im 101/176]
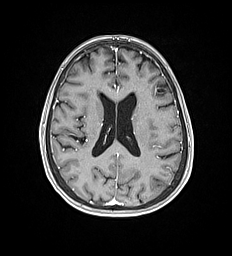
[im 126/176]
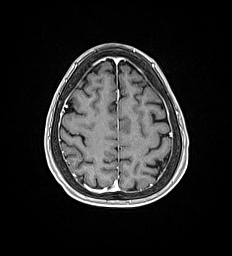
[im 151/176]
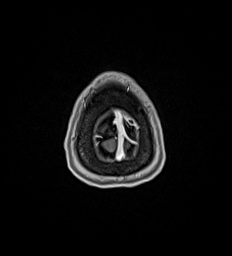
[im 176/176]
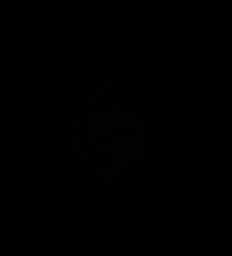

[Series 19: T1 post-contrast · coronal · 5.0mm · 0.57mm/px · 1 of 29 slices shown (2 of 3)]
[im 1/29]
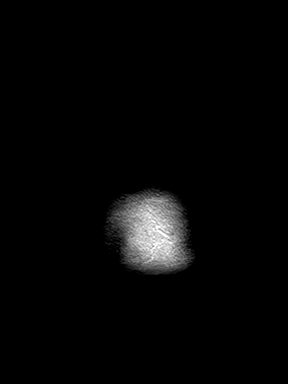

[Series 20: T1 post-contrast · sagittal · 5.0mm · 0.62mm/px · 1 of 24 slices shown (3 of 3)]
[im 1/24]
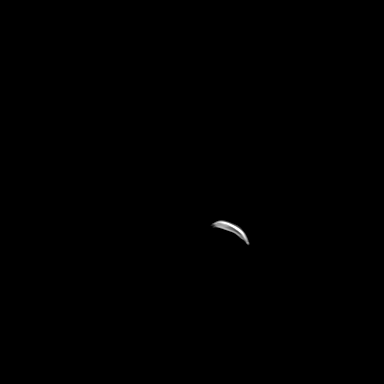

[48 of 48 positions shown; findings below may reference images not displayed]

FINDINGS: Brain:

No age-advanced or lobar predominant cerebral atrophy.

New from the prior brain MRI of [DATE], there is a 7 mm focus of
T2 FLAIR hyperintense signal abnormality and vague diffusion
weighted signal hyperintensity within the medial left thalamus
(series 15, image 26). There is corresponding enhancement at this
site (series 18, image 83).

Minimal multifocal T2 FLAIR hyperintense signal abnormality within
the cerebral white matter, nonspecific but compatible with chronic
small vessel ischemic disease.

No evidence of an intracranial mass.

No chronic intracranial blood products.

No extra-axial fluid collection.

No midline shift.

Vascular: Maintained flow voids within the proximal large arterial
vessels. A known 4 mm cavernous left ICA aneurysm was better
appreciated on the prior CTA head/neck of [DATE].

Skull and upper cervical spine: No focal suspicious marrow lesion.

Sinuses/Orbits: Visualized orbits show no acute finding. 10 mm
mucous retention cyst within the right maxillary sinus. 10 mm mucous
retention cyst within the left sphenoid sinus. Mild mucosal
thickening within the bilateral ethmoid sinuses.

Other: Trace fluid within the right mastoid air cells.

Impression #1 will be called to the ordering clinician or
representative by the Radiologist Assistant, and communication
documented in the PACS or [REDACTED].
IMPRESSION: 7 mm focus of signal abnormality and enhancement within the medial
left thalamus, new from the prior MRI of [DATE]. This may
reflect a subacute infarct. However, alternative considerations
include intracranial metastatic disease, an infectious/inflammatory
process, lymphoma or less likely a primary CNS neoplasm, among
others. A short-interval 6-8 week follow-up brain MRI is
recommended.

A known 4 mm cavernous left internal carotid artery aneurysm was
better appreciated on the prior CTA head/neck of [DATE].

Minimal chronic small-vessel ischemic changes within the cerebral
white matter, stable.

Paranasal sinus disease, as described.

Trace fluid within the right mastoid air cells.

## 2022-03-05 MED ORDER — GADOBUTROL 1 MMOL/ML IV SOLN
5.0000 mL | Freq: Once | INTRAVENOUS | Status: AC | PRN
  Administered 2022-03-05: 5 mL via INTRAVENOUS

## 2022-03-06 ENCOUNTER — Encounter: Payer: Self-pay | Admitting: Internal Medicine

## 2022-03-10 ENCOUNTER — Other Ambulatory Visit: Payer: Self-pay | Admitting: Ophthalmology

## 2022-03-10 DIAGNOSIS — H4912 Fourth [trochlear] nerve palsy, left eye: Secondary | ICD-10-CM | POA: Diagnosis not present

## 2022-03-12 ENCOUNTER — Other Ambulatory Visit: Payer: Self-pay | Admitting: Internal Medicine

## 2022-03-12 DIAGNOSIS — Z1231 Encounter for screening mammogram for malignant neoplasm of breast: Secondary | ICD-10-CM

## 2022-03-25 ENCOUNTER — Ambulatory Visit
Admission: RE | Admit: 2022-03-25 | Discharge: 2022-03-25 | Disposition: A | Discharge status: Home / Self Care | Payer: Medicare HMO | Source: Ambulatory Visit | Attending: Internal Medicine | Admitting: Internal Medicine

## 2022-03-25 DIAGNOSIS — Z1231 Encounter for screening mammogram for malignant neoplasm of breast: Secondary | ICD-10-CM

## 2022-03-25 IMAGING — MG MM DIGITAL SCREENING BILAT W/ TOMO AND CAD
8 series · 9 of 24 positions shown · non-contrast
Comparison: Previous exam(s).

CLINICAL DATA: Screening.

EXAM:
DIGITAL SCREENING BILATERAL MAMMOGRAM WITH TOMOSYNTHESIS AND CAD
TECHNIQUE: Bilateral screening digital craniocaudal and mediolateral oblique
mammograms were obtained. Bilateral screening digital breast
tomosynthesis was performed. The images were evaluated with
computer-aided detection.

[R MLO synth-2D]
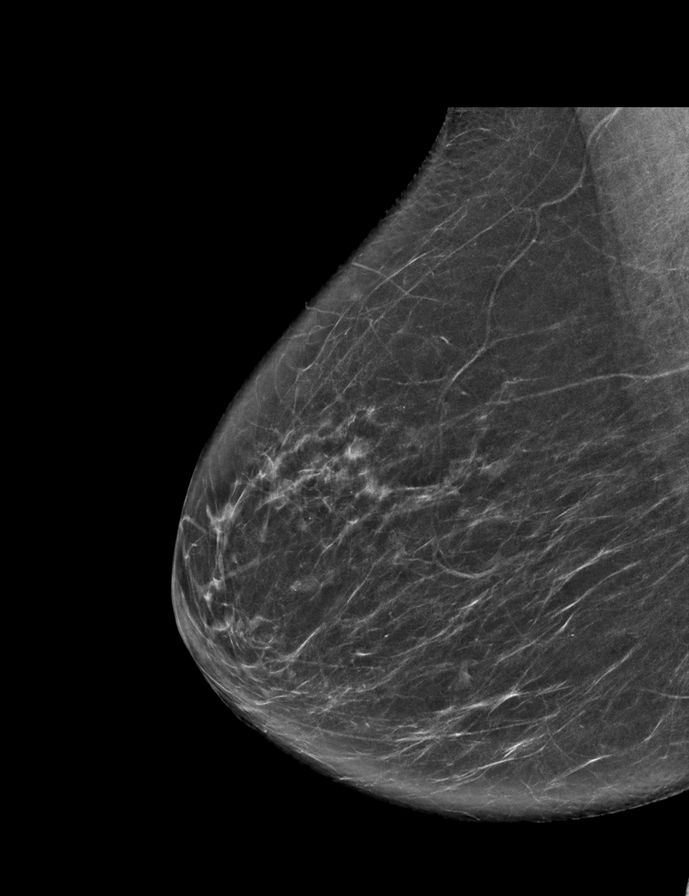

[L CC synth-2D]
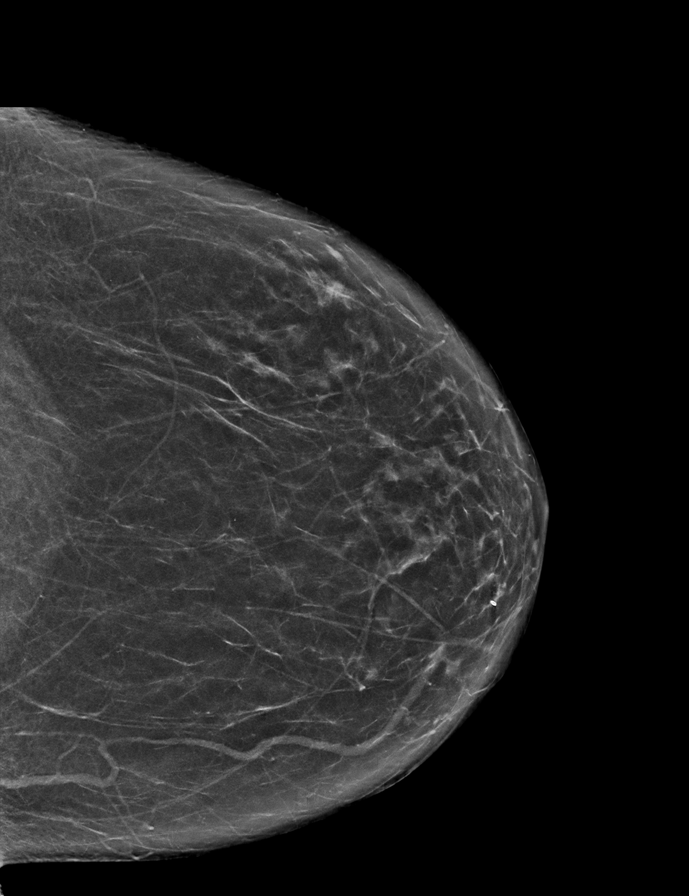

[L MLO synth-2D]
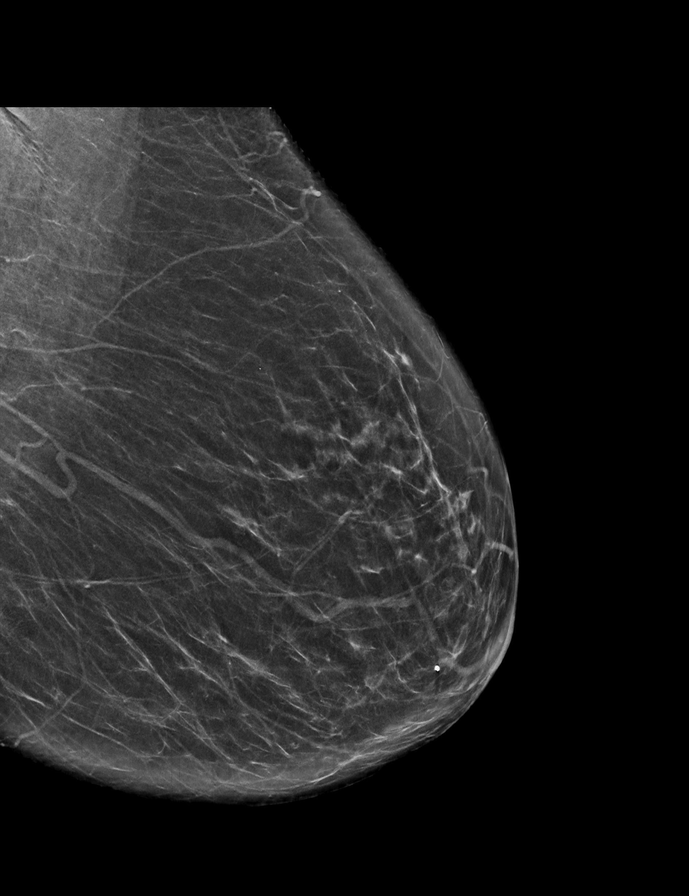

[R CC synth-2D]
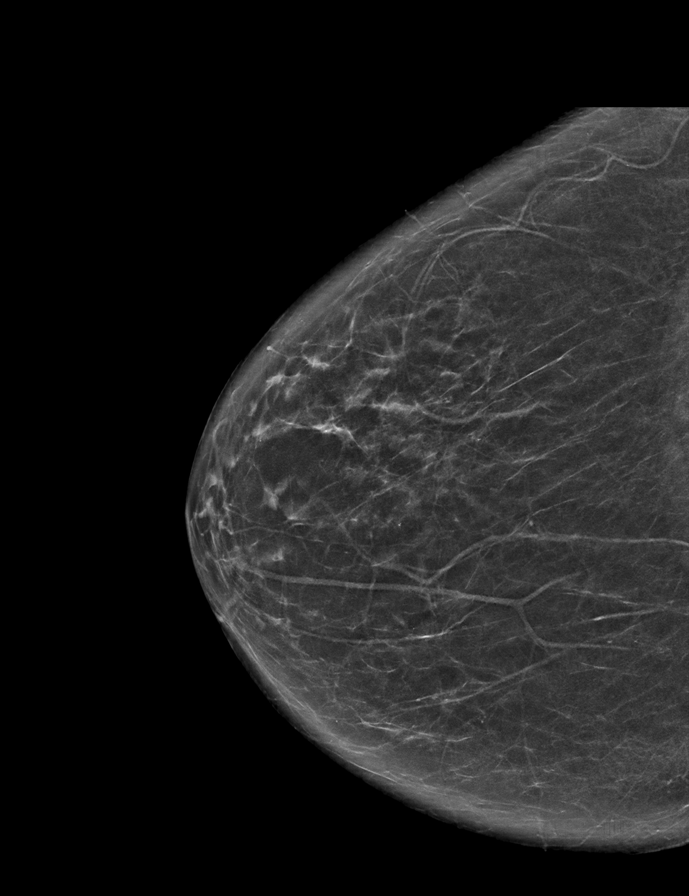

[L CC tomo · 2 of 58 frames shown]
[frame 19/58]
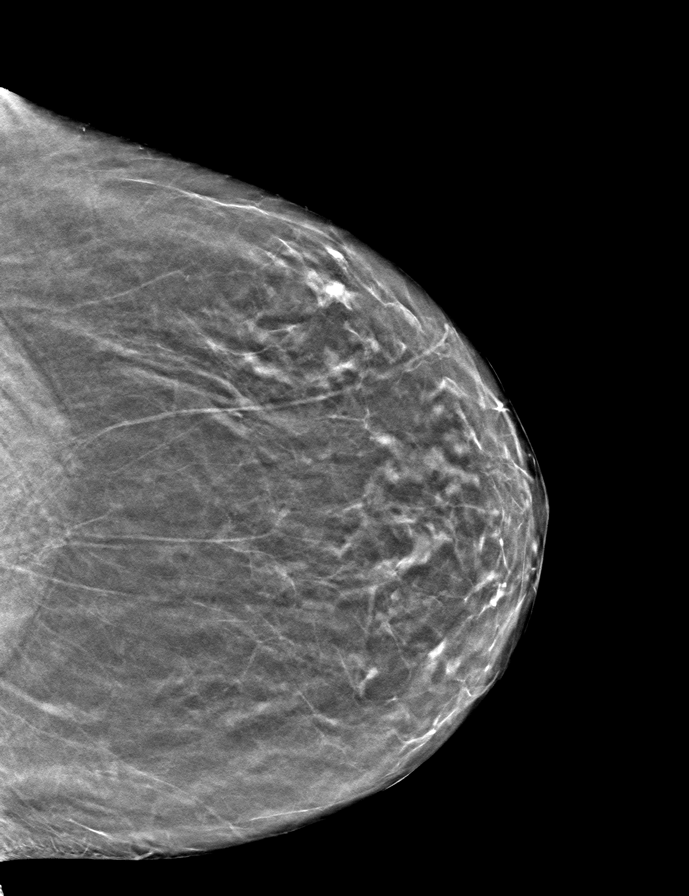
[frame 29/58]
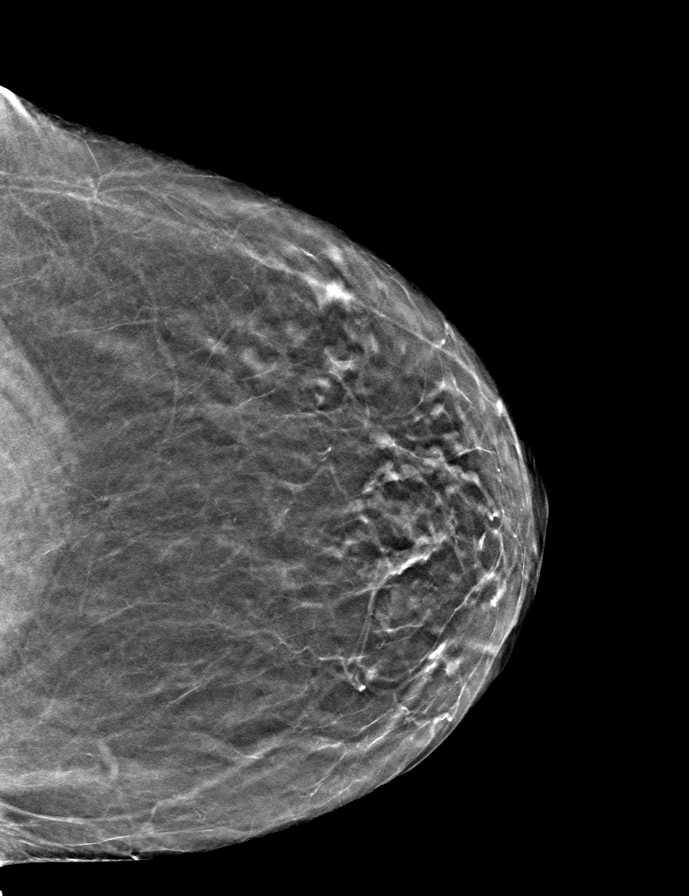

[R MLO tomo · tomo slice 33/64.0]
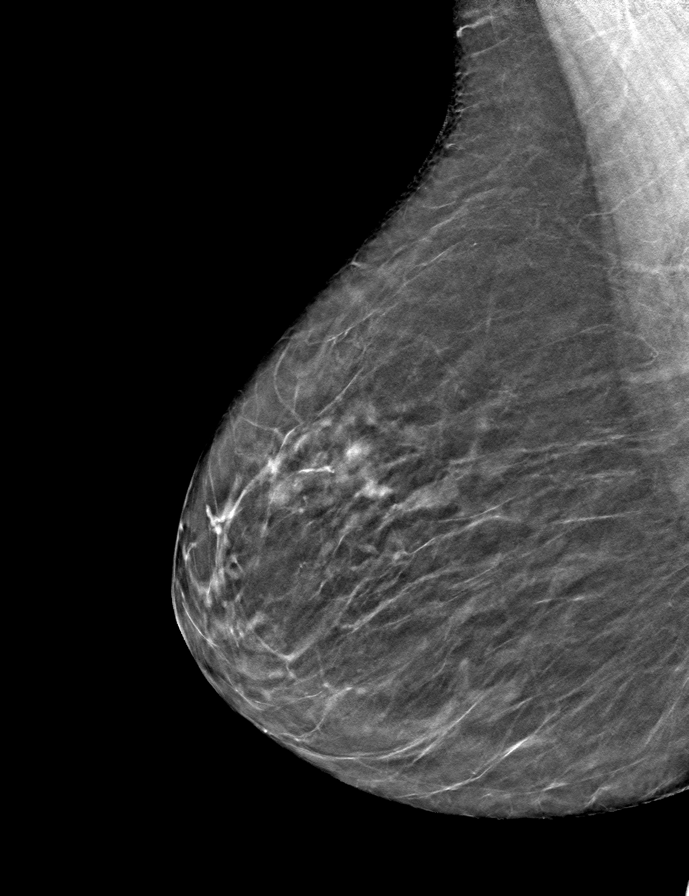

[L MLO tomo · tomo slice 33/65.0]
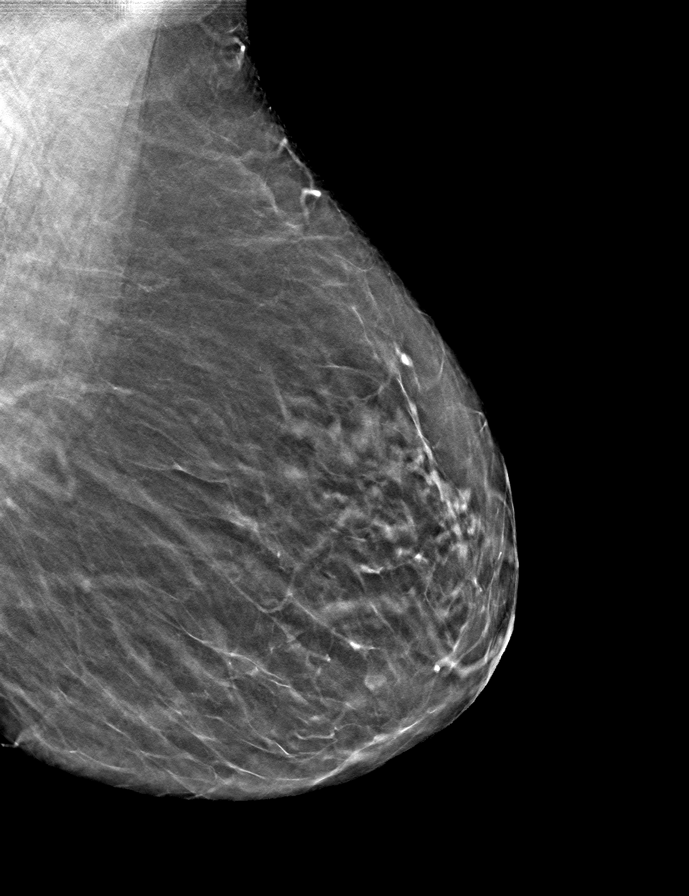

[R CC tomo · tomo slice 31/61.0]
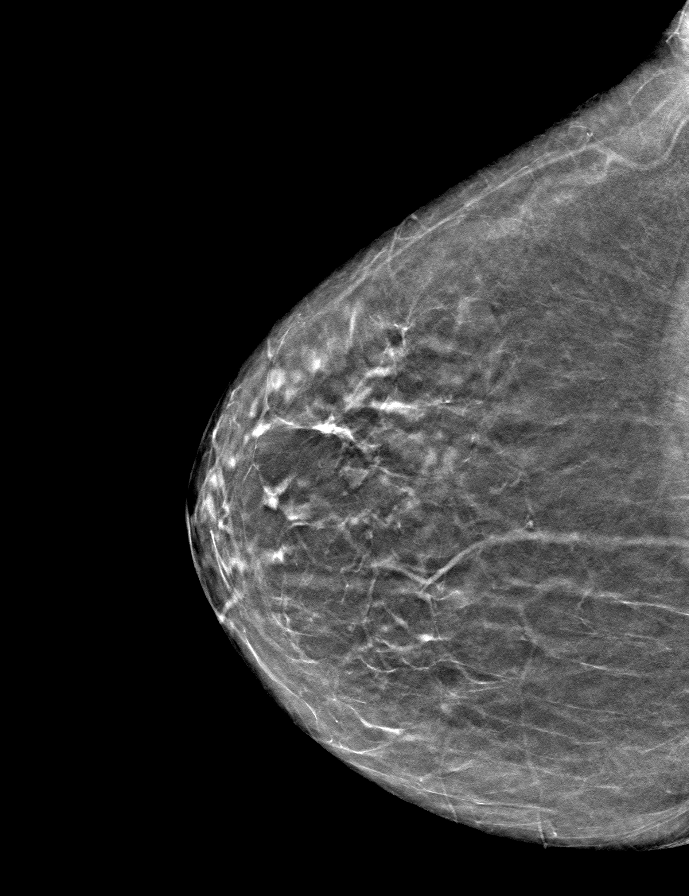

[9 of 24 positions shown; findings below may reference images not displayed]

ACR Breast Density Category b: There are scattered areas of
fibroglandular density.
FINDINGS: There are no findings suspicious for malignancy.
IMPRESSION: No mammographic evidence of malignancy. A result letter of this
screening mammogram will be mailed directly to the patient.

RECOMMENDATION:
Screening mammogram in one year. (Code:[BY])

BI-RADS CATEGORY  1: Negative.

## 2022-03-31 DIAGNOSIS — Z833 Family history of diabetes mellitus: Secondary | ICD-10-CM | POA: Diagnosis not present

## 2022-03-31 DIAGNOSIS — R69 Illness, unspecified: Secondary | ICD-10-CM | POA: Diagnosis not present

## 2022-03-31 DIAGNOSIS — Z7982 Long term (current) use of aspirin: Secondary | ICD-10-CM | POA: Diagnosis not present

## 2022-03-31 DIAGNOSIS — M1990 Unspecified osteoarthritis, unspecified site: Secondary | ICD-10-CM | POA: Diagnosis not present

## 2022-03-31 DIAGNOSIS — G3184 Mild cognitive impairment, so stated: Secondary | ICD-10-CM | POA: Diagnosis not present

## 2022-03-31 DIAGNOSIS — I10 Essential (primary) hypertension: Secondary | ICD-10-CM | POA: Diagnosis not present

## 2022-03-31 DIAGNOSIS — Z8249 Family history of ischemic heart disease and other diseases of the circulatory system: Secondary | ICD-10-CM | POA: Diagnosis not present

## 2022-03-31 DIAGNOSIS — G43909 Migraine, unspecified, not intractable, without status migrainosus: Secondary | ICD-10-CM | POA: Diagnosis not present

## 2022-03-31 DIAGNOSIS — Z791 Long term (current) use of non-steroidal anti-inflammatories (NSAID): Secondary | ICD-10-CM | POA: Diagnosis not present

## 2022-03-31 DIAGNOSIS — Z008 Encounter for other general examination: Secondary | ICD-10-CM | POA: Diagnosis not present

## 2022-03-31 DIAGNOSIS — I739 Peripheral vascular disease, unspecified: Secondary | ICD-10-CM | POA: Diagnosis not present

## 2022-03-31 DIAGNOSIS — E785 Hyperlipidemia, unspecified: Secondary | ICD-10-CM | POA: Diagnosis not present

## 2022-05-06 ENCOUNTER — Ambulatory Visit
Admission: RE | Admit: 2022-05-06 | Discharge: 2022-05-06 | Disposition: A | Discharge status: Home / Self Care | Payer: Medicare HMO | Source: Ambulatory Visit | Attending: Ophthalmology | Admitting: Ophthalmology

## 2022-05-06 DIAGNOSIS — H4912 Fourth [trochlear] nerve palsy, left eye: Secondary | ICD-10-CM | POA: Diagnosis not present

## 2022-05-06 DIAGNOSIS — R413 Other amnesia: Secondary | ICD-10-CM | POA: Diagnosis not present

## 2022-05-06 DIAGNOSIS — H532 Diplopia: Secondary | ICD-10-CM | POA: Diagnosis not present

## 2022-05-06 IMAGING — MR MR HEAD WO/W CM
15 series · 48 of 48 positions shown · IV contrast (5ml Gadavist)
Comparison: Brain MRI [DATE]

CLINICAL DATA: Follow-up abnormal brain MRI from [DATE]. Double
vision has since resolved, but still has short-term memory loss

EXAM:
MRI HEAD WITHOUT AND WITH CONTRAST
TECHNIQUE: Multiplanar, multiecho pulse sequences of the brain and surrounding
structures were obtained without and with intravenous contrast.
CONTRAST:  5mL GADAVIST GADOBUTROL 1 MMOL/ML IV SOLN

[Series 5: ax dwi_tracew · axial · 3.0mm · 0.65mm/px · z∈[-43,+106]mm · 4 of 48 slices shown]
[im 1/48]
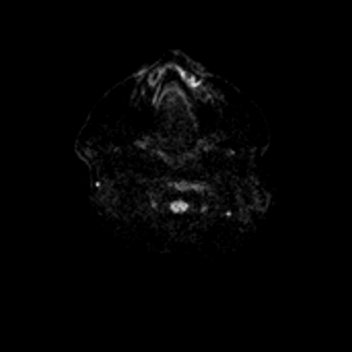
[im 16/48]
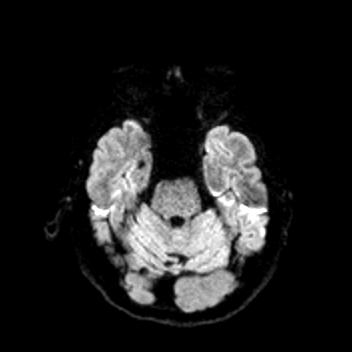
[im 32/48]
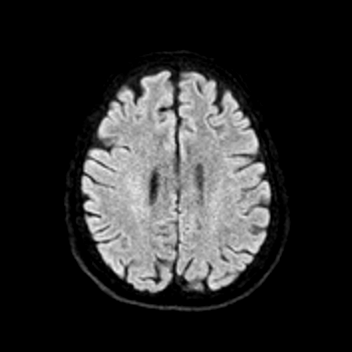
[im 48/48]
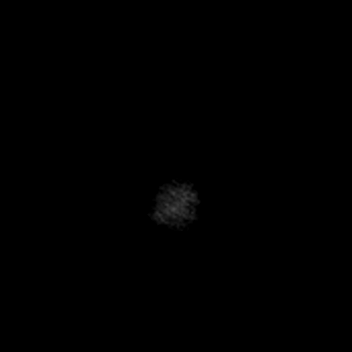

[Series 6: ax dwi_adc · axial · 3.0mm · 0.65mm/px · z∈[-43,+106]mm · 3 of 48 slices shown]
[im 1/48]
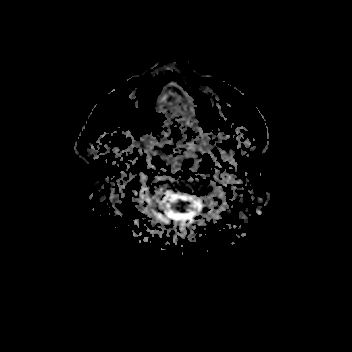
[im 24/48]
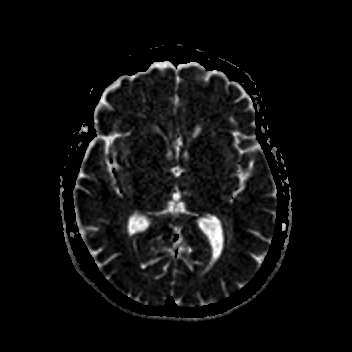
[im 48/48]
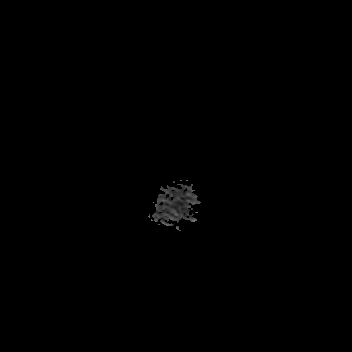

[Series 7: cor dwi_tracew · coronal · 5.0mm · 0.65mm/px · 2 of 38 slices shown]
[im 1/38]
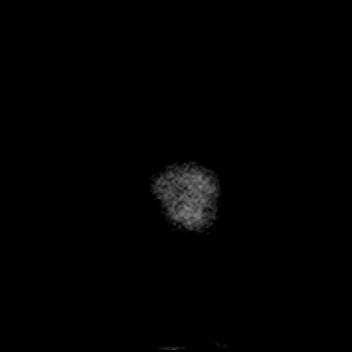
[im 38/38]
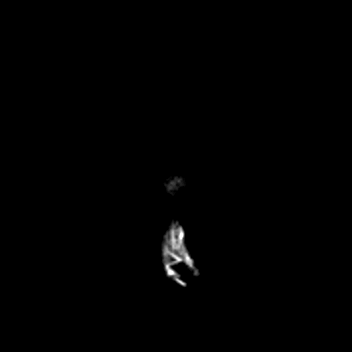

[Series 8: cor dwi_adc · coronal · 5.0mm · 0.65mm/px · 2 of 38 slices shown]
[im 1/38]
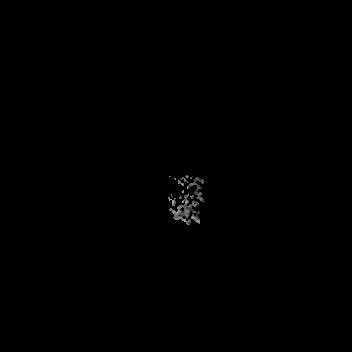
[im 38/38]
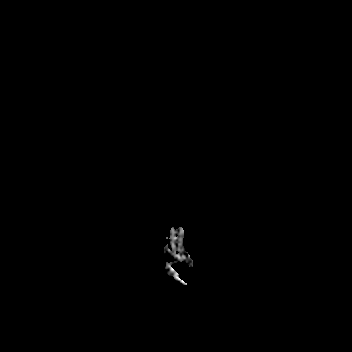

[Series 9: T1 · sagittal · 5.0mm · 0.62mm/px · 1 of 23 slices shown (1 of 2)]
[im 1/23]
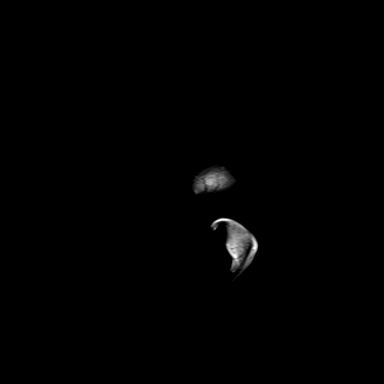

[Series 10: T2 · axial · 5.0mm · 0.53mm/px · 1 of 25 slices shown]
[im 1/25]
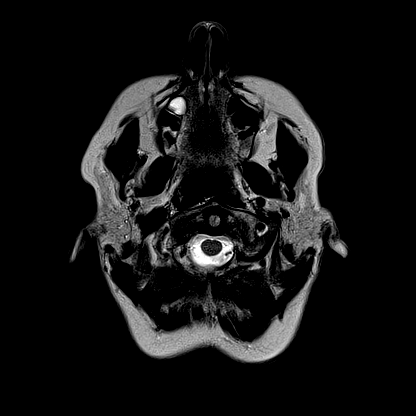

[Series 11: mag_images · axial · 3.0mm · 0.90mm/px · z∈[-50,+120]mm · 3 of 60 slices shown]
[im 1/60]
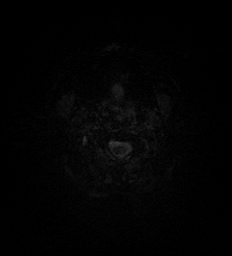
[im 30/60]
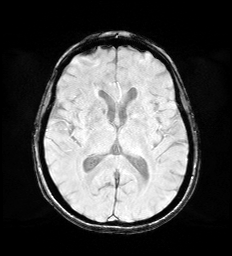
[im 60/60]
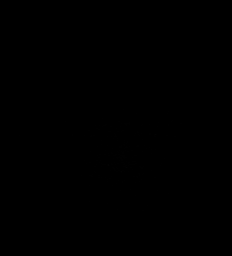

[Series 12: pha_images · axial · 3.0mm · 0.90mm/px · z∈[-50,+120]mm · 3 of 59 slices shown]
[im 1/59]
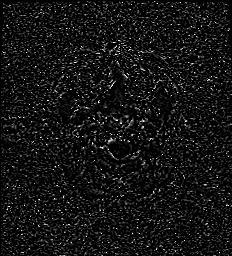
[im 30/59]
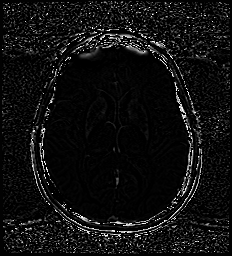
[im 59/59]
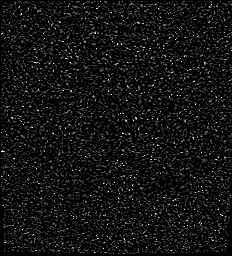

[Series 13: swi_images · axial · 3.0mm · 0.90mm/px · z∈[-50,+120]mm · 3 of 60 slices shown]
[im 1/60]
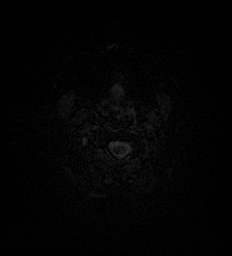
[im 30/60]
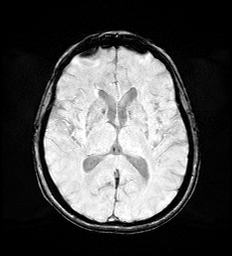
[im 60/60]
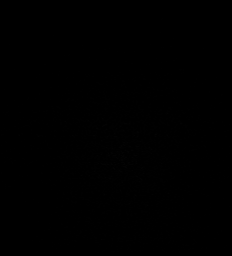

[Series 15: FLAIR · axial · 3.0mm · 0.53mm/px · z∈[-44,+111]mm · 3 of 55 slices shown]
[im 1/55]
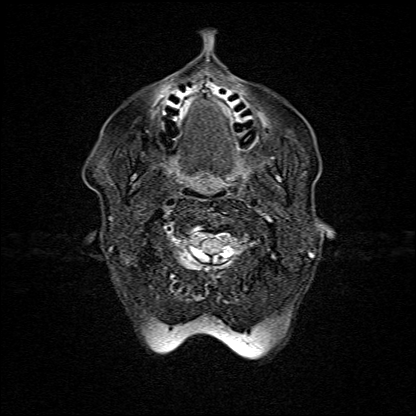
[im 28/55]
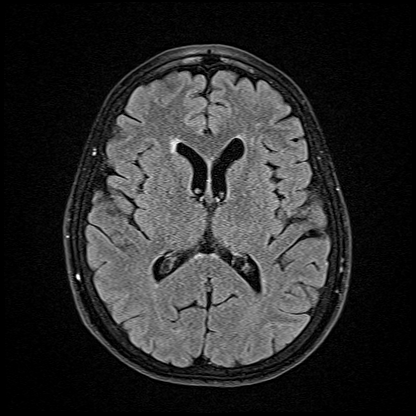
[im 55/55]
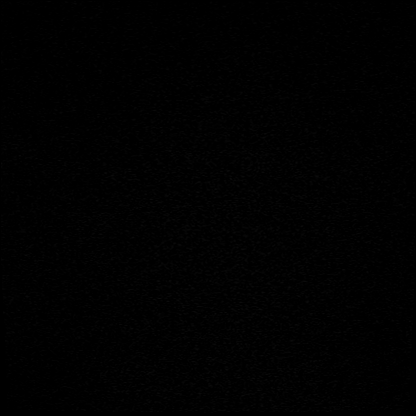

[Series 16: T1 · axial · 1.0mm · 0.98mm/px · z∈[-41,+111]mm · 9 of 160 slices shown (2 of 2)]
[im 1/160]
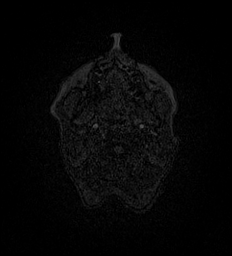
[im 20/160]
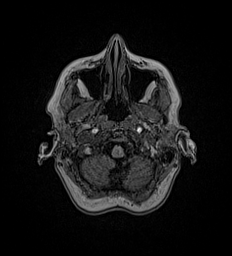
[im 40/160]
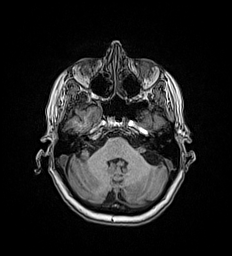
[im 60/160]
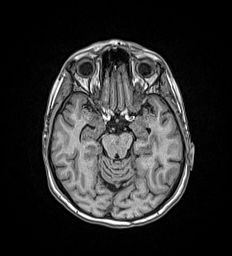
[im 80/160]
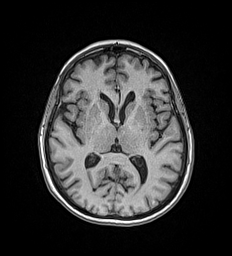
[im 100/160]
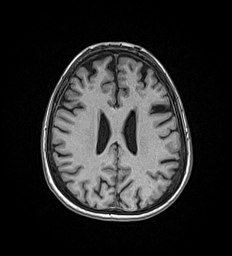
[im 120/160]
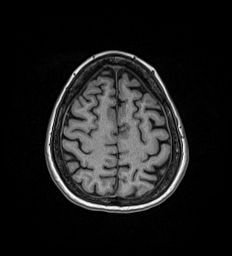
[im 140/160]
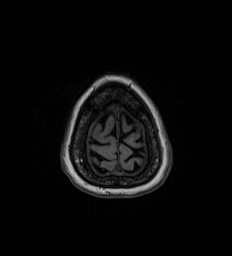
[im 160/160]
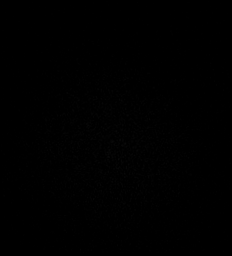

[Series 17: T2 post-contrast · coronal · 5.0mm · 0.57mm/px · 2 of 29 slices shown]
[im 1/29]
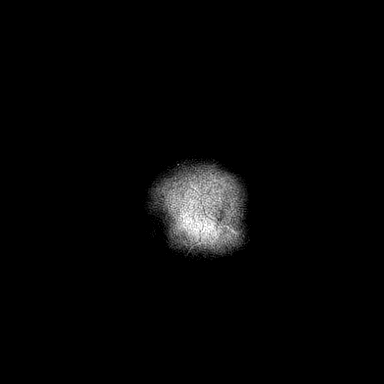
[im 29/29]
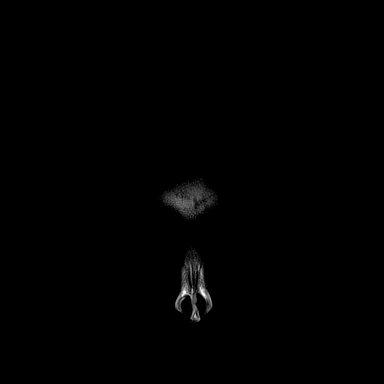

[Series 18: T1 post-contrast · axial · 1.0mm · 0.98mm/px · z∈[-41,+111]mm · 9 of 160 slices shown (1 of 3)]
[im 1/160]
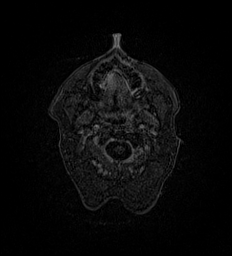
[im 20/160]
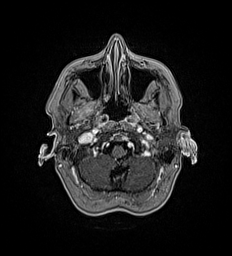
[im 40/160]
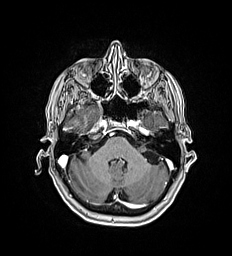
[im 60/160]
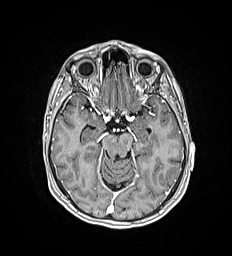
[im 80/160]
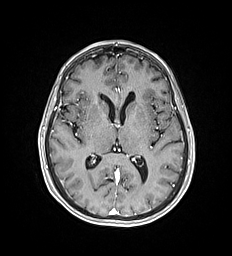
[im 100/160]
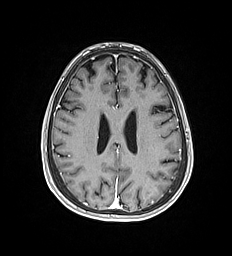
[im 120/160]
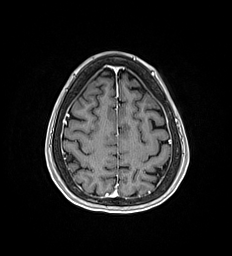
[im 140/160]
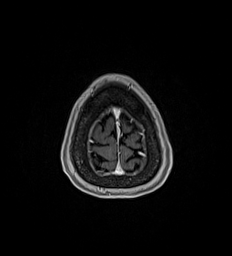
[im 160/160]
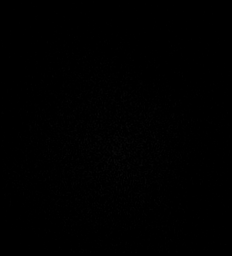

[Series 19: T1 post-contrast · coronal · 5.0mm · 0.57mm/px · 2 of 29 slices shown (2 of 3)]
[im 1/29]
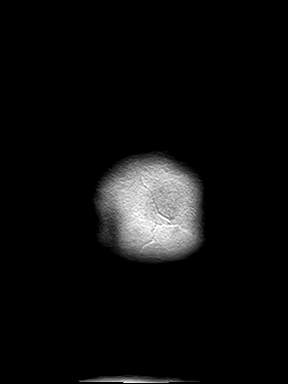
[im 29/29]
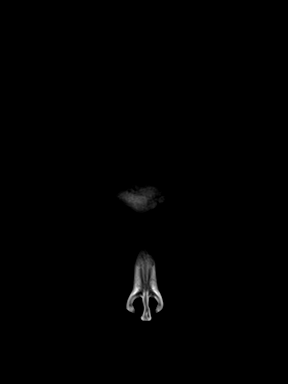

[Series 20: T1 post-contrast · sagittal · 5.0mm · 0.62mm/px · 1 of 23 slices shown (3 of 3)]
[im 1/23]
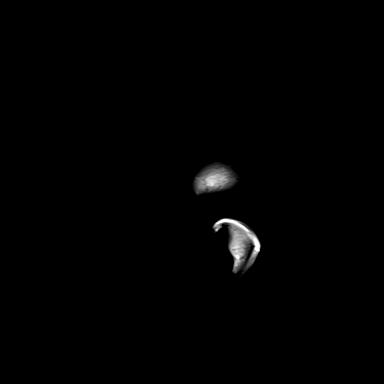

[48 of 48 positions shown; findings below may reference images not displayed]

FINDINGS: Brain: The previously seen focus of enhancement in the medial left
thalamus has resolved. There remains T2 signal abnormality without
diffusion restriction. Findings in keeping with a evolved infarct
which was subacute at the time of prior imaging.

There is no evidence of new/acute infarct. There is no acute
intracranial hemorrhage or extra-axial fluid collection.

Parenchymal volume is normal. The ventricles are normal in size.
There are minimal FLAIR hyperintensities in the subcortical and
periventricular white matter likely reflecting minimal chronic white
matter microangiopathy.

There is no suspicious parenchymal signal abnormality. There is no
new abnormal enhancement. There is no solid mass lesion. There is no
mass effect or midline shift.

Vascular: Normal flow voids. The known left cavernous ICA aneurysm
is again seen, better assessed on prior CTA.

Skull and upper cervical spine: Normal marrow signal.

Sinuses/Orbits: The paranasal sinuses are clear. The globes and
orbits are unremarkable.

Other: None.
IMPRESSION: 1. Resolved enhancement in the left thalamus consistent with evolved
lacunar infarct. No new or suspicious enhancement.
2. No new acute intracranial pathology.

## 2022-05-06 MED ORDER — GADOBUTROL 1 MMOL/ML IV SOLN
5.0000 mL | Freq: Once | INTRAVENOUS | Status: AC | PRN
  Administered 2022-05-06: 5 mL via INTRAVENOUS

## 2022-05-13 ENCOUNTER — Ambulatory Visit: Payer: Medicare HMO | Admitting: Dermatology

## 2022-05-13 DIAGNOSIS — L900 Lichen sclerosus et atrophicus: Secondary | ICD-10-CM | POA: Diagnosis not present

## 2022-05-13 NOTE — Progress Notes (Signed)
   Follow-Up Visit   Subjective  [name] is a 81 y.o. female who presents for the following: lichen sclerosus et atrophicus (6 month follow up. Still using tmc 0.1 ointment mostly 2 x weekly. Some improvement. ).  No burning/pain/itching.    The following portions of the chart were reviewed this encounter and updated as appropriate:      Review of Systems: No other skin or systemic complaints except as noted in HPI or Assessment and Plan.   Objective  Well appearing patient in no apparent distress; mood and affect are within normal limits.  A focused examination was performed including groin. Relevant physical exam findings are noted in the Assessment and Plan.  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    Lichen sclerosus is a chronic inflammatory condition that is NOT sexually transmitted. It requires regular monitoring and treatment to minimize inflammation to reduce risk of scarring. There is also a risk of cancer in the area which is very low if inflammation is well controlled. Regular checks of the area are recommended. Please call if you notice any new or changing spots within this area.    Return in about 6 months (around 11/12/2022) for LS et A . I, Ruthell Rummage, CMA, am acting as scribe for Brendolyn Patty, MD.  Documentation: I have reviewed the above documentation for accuracy and completeness, and I agree with the above.  Brendolyn Patty MD

## 2022-05-13 NOTE — Patient Instructions (Addendum)
Topical steroids (such as triamcinolone, fluocinolone, fluocinonide, mometasone, clobetasol, halobetasol, betamethasone, hydrocortisone) can cause thinning and lightening of the skin if they are used for too long in the same area. Your physician has selected the right strength medicine for your problem and area affected on the body. Please use your medication only as directed by your physician to prevent side effects.    Due to recent changes in healthcare laws, you may see results of your pathology and/or laboratory studies on MyChart before the doctors have had a chance to review them. We understand that in some cases there may be results that are confusing or concerning to you. Please understand that not all results are received at the same time and often the doctors may need to interpret multiple results in order to provide you with the best plan of care or course of treatment. Therefore, we ask that you please give us 2 business days to thoroughly review all your results before contacting the office for clarification. Should we see a critical lab result, you will be contacted sooner.   If You Need Anything After Your Visit  If you have any questions or concerns for your doctor, please call our main line at [phone] and press option 4 to reach your doctor's medical assistant. If no one answers, please leave a voicemail as directed and we will return your call as soon as possible. Messages left after 4 pm will be answered the following business day.   You may also send us a message via MyChart. We typically respond to MyChart messages within 1-2 business days.  For prescription refills, please ask your pharmacy to contact our office. Our fax number is [phone].  If you have an urgent issue when the clinic is closed that cannot wait until the next business day, you can page your doctor at the number below.    Please note that while we do our best to be available for urgent issues outside of  office hours, we are not available 24/7.   If you have an urgent issue and are unable to reach us, you may choose to seek medical care at your doctor's office, retail clinic, urgent care center, or emergency room.  If you have a medical emergency, please immediately call 911 or go to the emergency department.  Pager Numbers  - Dr. Kowalski: [phone]  - Dr. Moye: [phone]  - Dr. Stewart: [phone]  In the event of inclement weather, please call our main line at [phone] for an update on the status of any delays or closures.  Dermatology Medication Tips: Please keep the boxes that topical medications come in in order to help keep track of the instructions about where and how to use these. Pharmacies typically print the medication instructions only on the boxes and not directly on the medication tubes.   If your medication is too expensive, please contact our office at [phone] option 4 or send us a message through MyChart.   We are unable to tell what your co-pay for medications will be in advance as this is different depending on your insurance coverage. However, we may be able to find a substitute medication at lower cost or fill out paperwork to get insurance to cover a needed medication.   If a prior authorization is required to get your medication covered by your insurance company, please allow us 1-2 business days to complete this process.  Drug prices often vary depending on where the prescription is filled and some pharmacies   may offer cheaper prices.  The website www.goodrx.com contains coupons for medications through different pharmacies. The prices here do not account for what the cost may be with help from insurance (it may be cheaper with your insurance), but the website can give you the price if you did not use any insurance.  - You can print the associated coupon and take it with your prescription to the pharmacy.  - You may also stop by our office during  regular business hours and pick up a GoodRx coupon card.  - If you need your prescription sent electronically to a different pharmacy, notify our office through [facility] MyChart or by phone at [phone] option 4.     Si Usted Necesita Algo Despus de Su Visita  Tambin puede enviarnos un mensaje a travs de MyChart. Por lo general respondemos a los mensajes de MyChart en el transcurso de 1 a 2 das hbiles.  Para renovar recetas, por favor pida a su farmacia que se ponga en contacto con nuestra oficina. Nuestro nmero de fax es el [phone].  Si tiene un asunto urgente cuando la clnica est cerrada y que no puede esperar hasta el siguiente da hbil, puede llamar/localizar a su doctor(a) al nmero que aparece a continuacin.   Por favor, tenga en cuenta que aunque hacemos todo lo posible para estar disponibles para asuntos urgentes fuera del horario de oficina, no estamos disponibles las 24 horas del da, los 7 das de la semana.   Si tiene un problema urgente y no puede comunicarse con nosotros, puede optar por buscar atencin mdica  en el consultorio de su doctor(a), en una clnica privada, en un centro de atencin urgente o en una sala de emergencias.  Si tiene una emergencia mdica, por favor llame inmediatamente al 911 o vaya a la sala de emergencias.  Nmeros de bper  - Dr. Kowalski: [phone]  - Dra. Moye: [phone]  - Dra. Stewart: [phone]  En caso de inclemencias del tiempo, por favor llame a nuestra lnea principal al [phone] para una actualizacin sobre el estado de cualquier retraso o cierre.  Consejos para la medicacin en dermatologa: Por favor, guarde las cajas en las que vienen los medicamentos de uso tpico para ayudarle a seguir las instrucciones sobre dnde y cmo usarlos. Las farmacias generalmente imprimen las instrucciones del medicamento slo en las cajas y no directamente en los tubos del medicamento.   Si su medicamento es muy  caro, por favor, pngase en contacto con nuestra oficina llamando al [phone] y presione la opcin 4 o envenos un mensaje a travs de MyChart.   No podemos decirle cul ser su copago por los medicamentos por adelantado ya que esto es diferente dependiendo de la cobertura de su seguro. Sin embargo, es posible que podamos encontrar un medicamento sustituto a menor costo o llenar un formulario para que el seguro cubra el medicamento que se considera necesario.   Si se requiere una autorizacin previa para que su compaa de seguros cubra su medicamento, por favor permtanos de 1 a 2 das hbiles para completar este proceso.  Los precios de los medicamentos varan con frecuencia dependiendo del lugar de dnde se surte la receta y alguna farmacias pueden ofrecer precios ms baratos.  El sitio web www.goodrx.com tiene cupones para medicamentos de diferentes farmacias. Los precios aqu no tienen en cuenta lo que podra costar con la ayuda del seguro (puede ser ms barato con su seguro), pero el sitio web puede darle el precio si no   utiliz ningn seguro.  - Puede imprimir el cupn correspondiente y llevarlo con su receta a la farmacia.  - Tambin puede pasar por nuestra oficina durante el horario de atencin regular y recoger una tarjeta de cupones de GoodRx.  - Si necesita que su receta se enve electrnicamente a una farmacia diferente, informe a nuestra oficina a travs de MyChart de [facility] o por telfono llamando al [phone] y presione la opcin 4.  

## 2022-05-20 ENCOUNTER — Encounter: Payer: Self-pay | Admitting: Internal Medicine

## 2022-05-20 DIAGNOSIS — Z8673 Personal history of transient ischemic attack (TIA), and cerebral infarction without residual deficits: Secondary | ICD-10-CM

## 2022-07-09 DIAGNOSIS — G43719 Chronic migraine without aura, intractable, without status migrainosus: Secondary | ICD-10-CM | POA: Diagnosis not present

## 2022-07-18 ENCOUNTER — Encounter (HOSPITAL_BASED_OUTPATIENT_CLINIC_OR_DEPARTMENT_OTHER): Payer: Self-pay | Admitting: Internal Medicine

## 2022-07-18 ENCOUNTER — Ambulatory Visit (HOSPITAL_BASED_OUTPATIENT_CLINIC_OR_DEPARTMENT_OTHER): Payer: Medicare HMO | Admitting: Internal Medicine

## 2022-07-18 VITALS — BP 136/72 | HR 66 | Ht 63.0 in | Wt 117.3 lb

## 2022-07-18 DIAGNOSIS — E785 Hyperlipidemia, unspecified: Secondary | ICD-10-CM | POA: Diagnosis not present

## 2022-07-18 DIAGNOSIS — T466X5A Adverse effect of antihyperlipidemic and antiarteriosclerotic drugs, initial encounter: Secondary | ICD-10-CM | POA: Diagnosis not present

## 2022-07-18 DIAGNOSIS — I447 Left bundle-branch block, unspecified: Secondary | ICD-10-CM

## 2022-07-18 DIAGNOSIS — Z8249 Family history of ischemic heart disease and other diseases of the circulatory system: Secondary | ICD-10-CM | POA: Diagnosis not present

## 2022-07-18 DIAGNOSIS — M791 Myalgia, unspecified site: Secondary | ICD-10-CM | POA: Diagnosis not present

## 2022-07-18 MED ORDER — REPATHA SURECLICK 140 MG/ML ~~LOC~~ SOAJ: 1.0000 | SUBCUTANEOUS | 11 refills | Status: DC

## 2022-07-18 NOTE — Patient Instructions (Addendum)
Medication Instructions:  Dr. Debara Pickett recommends Repatha 137m/mL or Praluent 1560mmL (PCSK9). This is an injectable cholesterol medication self-administered once every 14 days. This medication will likely need prior approval with your insurance company, which we will work on. If the medication is not approved initially, we may need to do an appeal with your insurance.   Administer medication in area of fatty tissue such as abdomen, outer thigh, back of upper arm - and rotate site with each injection Store medication in refrigerator until ready to administer - allow to sit at room temp for 30 mins - 1 hour prior to injection Dispose of medication in a SHARPS container - your pharmacy should be able to direct you on this and proper disposal   If you need a co-pay card for Repatha: [email or web address] you need a co-pay card for Praluent: [email or web address].[email or web address] Assistance:    These foundations have funds at various times.   The PAN Foundation: https://www.panfoundation.org/disease-funds/hypercholesterolemia/ -- can sign up for wait list  The [facility] assistance to help pay for medication copays.  They will cover copays for all cholesterol lowering meds, including statins, fibrates, omega-3 fish oils like Vascepa, ezetimibe, Repatha, Praluent, Nexletol, Nexlizet.  The cards are usually good for $2,500 or 12 months, whichever comes first. Go to healthwellfoundation.org Click on "Apply Now" Answer questions as to whom is applying (patient or representative) Your disease fund will be "hypercholesterolemia - Medicare access" They will ask questions about finances and which medications you are taking for cholesterol When you submit, the approval is usually within minutes.  You will need to print the card information from the site You will need to show this information to your pharmacy, they will bill your Medicare Part D plan first  -then bill Health Well --for the copay.   You can also call them at [phone] the hold times can be quite long.     *If you need a refill on your cardiac medications before your next appointment, please call your pharmacy*   Lab Work: FASTING lab work to check cholesterol in 3-4 months -- complete about 1 week before next visit with Dr. HiDebara Pickett If you have labs (blood work) drawn today and your tests are completely normal, you will receive your results only by: [place] you have MyChart) OR A paper copy in the mail If you have any lab test that is abnormal or we need to change your treatment, we will call you to review the results.   Follow-Up: [facility] and your [facility] needs are our [facility].  As part of our continuing mission to provide you with exceptional heart care, we have created designated Provider Care Teams.  These Care Teams include your primary Cardiologist (physician) and Advanced Practice Providers (APPs -  Physician Assistants and Nurse Practitioners) who all work together to provide you with the care you need, when you need it.  We recommend signing up for the patient portal called "MyChart".  Sign up information is provided on this After Visit Summary.  MyChart is used to connect with patients for Virtual Visits (Telemedicine).  Patients are able to view lab/test results, encounter notes, upcoming appointments, etc.  Non-urgent messages can be sent to your provider as well.   To learn more about what you can do with MyChart, go to [email or web address]   Your next appointment:    3-4 months with Dr. HiDebara Pickett- lipid clinic

## 2022-07-21 ENCOUNTER — Telehealth: Payer: Self-pay

## 2022-07-21 NOTE — Progress Notes (Signed)
LIPID CLINIC CONSULT NOTE  Chief Complaint:  Manage dyslipidemia  Primary Care Physician: Venia Carbon, MD  Primary Cardiologist:  None  HPI:  [name] is a 81 y.o. female who is being seen today for the evaluation of dyslipidemia at the request of Venia Carbon, MD. is a pleasant-year-old female, referred for management of dyslipidemia.  She has a history of aortic atherosclerosis, left bundle branch block and dyslipidemia.  She had a recent lipid profile in February 2023 which showed total cholesterol 225, triglycerides 121, HDL 55 and LDL 146.  Unfortunately she has been intolerant to statins which caused severe muscle pain.  She also has a first-degree relative (father) who had an MI at age 18.  PMHx:  Past Medical History:  Diagnosis Date   Anxiety    Aortic atherosclerosis ([place])    GERD (gastroesophageal reflux disease)    Dr. 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 Theodoro Kalata (hard of hearing)    uses hearing aid on left side/complete loss of hearing right side   IBS (irritable bowel syndrome)    LBBB (left bundle branch block)    Migraine    OA (osteoarthritis)    Osteopenia 2005   Ulcer 2003   bleeding stomach ulcer    Past Surgical History:  Procedure Laterality Date   [place] N/A 01/20/2019   Procedure: DIAGNOSTIC LAP WITH SMALL BOWEL RESECTION;  Surgeon: Kieth Brightly Arta Bruce, MD;  Location: [facility];  Service: General;  Laterality: N/A;   [place]   okay, mild changes   CHOLECYSTECTOMY  1992   COLONOSCOPY  2003   DEXA N/A 2005   borderline osteopenia   DIAGNOSTIC LAPAROSCOPY  11/25/2018   LAPAROSCOPY N/A 11/25/2018   Procedure: LAPAROSCOPY DIAGNOSTIC;  Surgeon: Mickeal Skinner, MD;  Location: [facility];  Service:  General;  Laterality: N/A;   TONSILLECTOMY AND ADENOIDECTOMY  1953   UPPER GASTROINTESTINAL ENDOSCOPY  2003    FAMHx:  Family History  Problem Relation Age of Onset   Heart attack Father 46   Diabetes Father    Coronary artery disease Father    COPD Mother    Hypertension Mother    Hypertension Sister    Pulmonary fibrosis Sister    Cancer Daughter        breast   Rheum arthritis Daughter    Colon cancer Neg Hx    Esophageal cancer Neg Hx    Rectal cancer Neg Hx     SOCHx:   reports that she has never smoked. She has never used smokeless tobacco. She reports that she does not drink alcohol and does not use drugs.  ALLERGIES:  Allergies  Allergen Reactions   Clonazepam Swelling and Other (See Comments)    REACTION: Swollen lips, sore mouth   Penicillins Other (See Comments)    Unrecalled reaction Has patient had a PCN reaction causing immediate rash, facial/tongue/throat swelling, SOB or lightheadedness with hypotension: Unknown Has patient had a PCN reaction causing severe rash involving mucus membranes or skin necrosis: Unknown Has patient had a PCN reaction that required hospitalization: Unknown Has patient had a PCN reaction occurring within the last 10 years: No  If all of the above answers are "NO", then may proceed with Cephalosporin use.    Statins Other (See Comments)    Severe muscle pain    ROS: Pertinent items noted in HPI and remainder of comprehensive ROS otherwise negative.  HOME MEDS: Current Outpatient Medications on File Prior to Visit  Medication Sig Dispense Refill   aspirin EC 81 MG EC tablet Take 1 tablet (81 mg total) by mouth daily. Swallow whole. 30 tablet 11   baclofen (LIORESAL) 10 MG tablet Take 10 mg by mouth daily as needed. For headache     carbamide peroxide (DEBROX) 6.5 % OTIC solution Place 5 drops into the right ear daily as needed (for wax build up).     clorazepate (TRANXENE) 7.5 MG tablet Take 1 tablet (7.5 mg total) by mouth 2  (two) times daily as needed for anxiety. 20 tablet 0   losartan (COZAAR) 25 MG tablet Take 1 tablet (25 mg total) by mouth daily. 90 tablet 3   Multiple Vitamin (MULTIVITAMIN) tablet Take 1 tablet by mouth daily.     Potassium Gluconate 550 MG TABS Take 1,100 mg by mouth daily.     Probiotic Product (PROBIOTIC PO) Take 1 capsule by mouth daily.     vitamin B-12 (CYANOCOBALAMIN) 1000 MCG tablet Take 1,000 mcg by mouth daily.     zonisamide (ZONEGRAN) 100 MG capsule Take 100 mg by mouth daily.     No current facility-administered medications on file prior to visit.    LABS/IMAGING: No results found for this or any previous visit (from the past 48 hour(s)). No results found.  LIPID PANEL:    Component Value Date/Time   CHOL 225 (H) 01/16/2022 0826   TRIG 121 01/16/2022 0826   HDL 55 01/16/2022 0826   CHOLHDL 4.1 01/16/2022 0826   VLDL 24 01/16/2022 0826   LDLCALC 146 (H) 01/16/2022 0826   LDLDIRECT 150.0 07/23/2021 0949    WEIGHTS: Wt Readings from Last 3 Encounters:  07/18/22 117 lb 4.8 oz (53.2 kg)  02/17/22 114 lb (51.7 kg)  01/23/22 116 lb (52.6 kg)    VITALS: BP 136/72   Pulse 66   Ht 5' 3"  (1.6 m)   Wt 117 lb 4.8 oz (53.2 kg)   SpO2 98%   BMI 20.78 kg/m   EXAM: General appearance: alert and no distress Neck: no carotid bruit, no JVD, and thyroid not enlarged, symmetric, no tenderness/mass/nodules Lungs: clear to auscultation bilaterally Heart: regular rate and rhythm, S1, S2 normal, no murmur, click, rub or gallop Abdomen: soft, non-tender; bowel sounds normal; no masses,  no organomegaly Extremities: extremities normal, atraumatic, no cyanosis or edema Pulses: 2+ and symmetric Skin: Skin color, texture, turgor normal. No rashes or lesions Neurologic: Grossly normal : Pleasant  EKG: Deferred  ASSESSMENT: Dyslipidemia, goal LDL less than 70 Statin intolerant-myalgias First-degree relative with early onset heart disease Aortic atherosclerosis Left  bundle branch block  PLAN: 1.   Ms. Allor has a mixed dyslipidemia and is above target LDL less than 70.  Unfortunately she has been statin intolerant with severe myalgias to more than to statins.  She has a first-degree relative, her father who had early onset heart disease with an MI in his 36s.  She also has aortic atherosclerosis/ASCVD and a left bundle branch block.  No known coronary disease.  She needs aggressive lipid-lowering due to risk factors and I would advise a target LDL less than 70.  She can achieve 50% reduction with the PCSK9  inhibitor since she cannot tolerate statins.  We will reach out for prior authorization.  Plan repeat lipid NMR and LP(a) in about 3 to 4 months and follow-up with me at that time.  Thanks again for the kind referral.  Pixie Casino, MD, FACC, [place] Director of the Advanced Lipid Disorders &  Cardiovascular Risk Reduction Clinic Diplomate of the American Board of Clinical Lipidology Attending Cardiologist  Direct Dial: [phone]  Fax: [phone]  Website:  [email or web address] 07/21/2022, 8:33 PM

## 2022-07-21 NOTE — Telephone Encounter (Signed)
PA for Praluent 150MG/ML is approved from 12/08/2021 to 12/07/2022.

## 2022-07-21 NOTE — Telephone Encounter (Signed)
PA submitted by [facility] key: [id number], PA Case ID: [id number].

## 2022-07-23 ENCOUNTER — Telehealth: Payer: Self-pay | Admitting: Internal Medicine

## 2022-07-23 MED ORDER — PRALUENT 150 MG/ML ~~LOC~~ SOAJ: 1.0000 | SUBCUTANEOUS | 11 refills | Status: DC

## 2022-07-23 NOTE — Telephone Encounter (Signed)
Pt c/o medication issue:  1. Name of Medication: [place] 144m/mL   2. How are you currently taking this medication (dosage and times per day)?   3. Are you having a reaction (difficulty breathing--STAT)?   4. What is your medication issue? Pt's [facility] they were approved for it. She states that she went to the website, but it doesn't look like they have any type of assistance for this and would like a call back to make sure she is going to the right place.

## 2022-07-23 NOTE — Telephone Encounter (Signed)
Please advise 

## 2022-07-23 NOTE — Telephone Encounter (Signed)
Update sent to patient in [place]

## 2022-07-23 NOTE — Addendum Note (Signed)
Addended by: Fidel Levy on: 07/23/2022 09:43 AM   Modules accepted: Orders

## 2022-07-24 NOTE — Telephone Encounter (Signed)
Spoke with patient about the grant. Explained that the [facility] can open and close at various times, of which we do not know. Advised to check periodically for this. Also advised to check with mail order pharmacy to see if cost is less. No further assistance needed at this time.

## 2022-07-28 NOTE — Telephone Encounter (Signed)
Pt c/o medication issue:  1. Name of Medication:  [place] 128m/mL   2. How are you currently taking this medication (dosage and times per day)?   3. Are you having a reaction (difficulty breathing--STAT)?   4. What is your medication issue?   [employer] following up, stating they received our financial assistance application. However, they are needing an Rx with diagnosis codes. She mentions they will be faxing a form that will need to be completed. It will include prescription information needed as well as appropriate diagnosis codes. She states only section 1 and 2 of the form will need to be completed. Section 2 will need to be hand-signed.  Phone: [phone]: [phone]

## 2022-07-30 ENCOUNTER — Encounter: Payer: Self-pay | Admitting: Internal Medicine

## 2022-07-30 ENCOUNTER — Ambulatory Visit (INDEPENDENT_AMBULATORY_CARE_PROVIDER_SITE_OTHER): Payer: Medicare HMO | Admitting: Internal Medicine

## 2022-07-30 VITALS — BP 112/74 | HR 75 | Temp 97.7°F | Ht 62.25 in | Wt 117.0 lb

## 2022-07-30 DIAGNOSIS — Z8673 Personal history of transient ischemic attack (TIA), and cerebral infarction without residual deficits: Secondary | ICD-10-CM

## 2022-07-30 DIAGNOSIS — I10 Essential (primary) hypertension: Secondary | ICD-10-CM

## 2022-07-30 DIAGNOSIS — R69 Illness, unspecified: Secondary | ICD-10-CM | POA: Diagnosis not present

## 2022-07-30 DIAGNOSIS — G3184 Mild cognitive impairment, so stated: Secondary | ICD-10-CM

## 2022-07-30 DIAGNOSIS — Z0000 Encounter for general adult medical examination without abnormal findings: Secondary | ICD-10-CM

## 2022-07-30 DIAGNOSIS — F39 Unspecified mood [affective] disorder: Secondary | ICD-10-CM

## 2022-07-30 DIAGNOSIS — E538 Deficiency of other specified B group vitamins: Secondary | ICD-10-CM

## 2022-07-30 DIAGNOSIS — I700 Atherosclerosis of aorta: Secondary | ICD-10-CM

## 2022-07-30 DIAGNOSIS — K5090 Crohn's disease, unspecified, without complications: Secondary | ICD-10-CM

## 2022-07-30 LAB — COMPREHENSIVE METABOLIC PANEL
ALT: 13 U/L (ref 0–35)
AST: 18 U/L (ref 0–37)
Albumin: 4.1 g/dL (ref 3.5–5.2)
Alkaline Phosphatase: 73 U/L (ref 39–117)
BUN: 9 mg/dL (ref 6–23)
CO2: 22 mEq/L (ref 19–32)
Calcium: 9.1 mg/dL (ref 8.4–10.5)
Chloride: 105 mEq/L (ref 96–112)
Creatinine, Ser: 0.8 mg/dL (ref 0.40–1.20)
GFR: 69.61 mL/min (ref >60.00)
Glucose, Bld: 93 mg/dL (ref 70–99)
Potassium: 4.5 mEq/L (ref 3.5–5.1)
Sodium: 140 mEq/L (ref 135–145)
Total Bilirubin: 0.3 mg/dL (ref 0.2–1.2)
Total Protein: 6.1 g/dL (ref 6.0–8.3)

## 2022-07-30 LAB — CBC
HCT: 39.8 % (ref 36.0–46.0)
Hemoglobin: 13.2 g/dL (ref 12.0–15.0)
MCHC: 33.1 g/dL (ref 30.0–36.0)
MCV: 92.1 fl (ref 78.0–100.0)
Platelets: 229 10*3/uL (ref 150.0–400.0)
RBC: 4.32 Mil/uL (ref 3.87–5.11)
RDW: 13.1 % (ref 11.5–15.5)
WBC: 5 10*3/uL (ref 4.0–10.5)

## 2022-07-30 LAB — LDL CHOLESTEROL, DIRECT: Direct LDL: 82 mg/dL

## 2022-07-30 LAB — LIPID PANEL
Cholesterol: 154 mg/dL (ref 0–200)
HDL: 55.4 mg/dL (ref >39.00)
NonHDL: 98.7
Total CHOL/HDL Ratio: 3
Triglycerides: 203 mg/dL — ABNORMAL HIGH (ref 0.0–149.0)
VLDL: 40.6 mg/dL — ABNORMAL HIGH (ref 0.0–40.0)

## 2022-07-30 LAB — VITAMIN B12: Vitamin B-12: 539 pg/mL (ref 211–911)

## 2022-07-30 LAB — TSH: TSH: 3.36 u[IU]/mL (ref 0.35–5.50)

## 2022-07-30 NOTE — Assessment & Plan Note (Signed)
Now on praulent Didn't tolerate statins

## 2022-07-30 NOTE — Assessment & Plan Note (Signed)
Has been calm--no Rx needed

## 2022-07-30 NOTE — Progress Notes (Signed)
Subjective:    Patient ID: [name], female    DOB: [birth year], 81 y.o.   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 Decline on left. Has hearing aide No alcohol or tobacco Not exercising---hopes to start walking. Discussed resistance work No falls Chronic mild mood issues Drives locally. Does all instrumental ADLs Ongoing mild memory problems--slight progression  Had brain MRI due to diplopia  Resolved stroke on repeat imaging Went to lipid clinic--on praluent (but trouble getting the injector to work on his abdomen)  Several other ER visits Concerns about BP, etc She gets some higher numbers--like 140's---but usually okay when checked in office No chest pain or SOB No syncope--gets slight "woozy" feeling at times Right ankle swelling is chronic--no other swelling No palpitations  Continues on B12 supplement  Crohn's is quiet No Rx for this other than probiotic Bowels are good now--loose at times (goes daily). No blood  Known aortic atherosclerosis on imaging  Still has episodic depressed mood Gets teary eyed--but brief (distracts herself) Not daily Not anhedonic  Headaches are controlled with the zonisamide Rare baclofen  Current Outpatient Medications on File Prior to Visit  Medication Sig Dispense Refill   Alirocumab (PRALUENT) 150 MG/ML SOAJ Inject 1 Dose into the skin every 14 (fourteen) days. 2 mL 11   aspirin EC 81 MG EC tablet Take 1 tablet (81 mg total) by mouth daily. Swallow whole. 30 tablet 11   baclofen (LIORESAL) 10 MG tablet Take 10 mg by mouth daily as needed. For headache     carbamide peroxide (DEBROX) 6.5 % OTIC solution  Place 5 drops into the right ear daily as needed (for wax build up).     clorazepate (TRANXENE) 7.5 MG tablet Take 1 tablet (7.5 mg total) by mouth 2 (two) times daily as needed for anxiety. 20 tablet 0   losartan (COZAAR) 25 MG tablet Take 1 tablet (25 mg total) by mouth daily. 90 tablet 3   Multiple Vitamin (MULTIVITAMIN) tablet Take 1 tablet by mouth daily.     Potassium Gluconate 550 MG TABS Take 1,100 mg by mouth daily.     Probiotic Product (PROBIOTIC PO) Take 1 capsule by mouth daily.     vitamin B-12 (CYANOCOBALAMIN) 1000 MCG tablet Take 1,000 mcg by mouth daily.     zonisamide (ZONEGRAN) 100 MG capsule Take 100 mg by mouth daily.     No current facility-administered medications on file prior to visit.    Allergies  Allergen Reactions   Clonazepam Swelling and Other (See Comments)    REACTION: Swollen lips, sore mouth   Penicillins Other (See Comments)    Unrecalled reaction Has patient had a PCN reaction causing immediate rash, facial/tongue/throat swelling, SOB or lightheadedness with hypotension: Unknown Has patient had a PCN reaction causing severe rash involving mucus membranes or skin necrosis: Unknown Has patient had a PCN reaction that required hospitalization: Unknown Has patient had a PCN reaction occurring within the last 10 years: No If all of the above answers are "NO", then may proceed with Cephalosporin use.    Statins Other (See Comments)    Severe muscle pain    Past Medical History:  Diagnosis Date   Anxiety    Aortic atherosclerosis ([place])    GERD (  gastroesophageal reflux disease)    Dr. 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 Lia Foyer   [facility] (hard of hearing)    uses hearing aid on left side/complete loss of hearing right side   IBS (irritable bowel syndrome)    LBBB (left bundle branch block)    Migraine    OA  (osteoarthritis)    Osteopenia 2005   Ulcer 2003   bleeding stomach ulcer    Past Surgical History:  Procedure Laterality Date   [place] N/A 01/20/2019   Procedure: DIAGNOSTIC LAP WITH SMALL BOWEL RESECTION;  Surgeon: Mickeal Skinner, MD;  Location: [facility];  Service: General;  Laterality: N/A;   CARDIAC CATHETERIZATION  1997   okay, mild changes   CHOLECYSTECTOMY  1992   COLONOSCOPY  2003   DEXA N/A 2005   borderline osteopenia   DIAGNOSTIC LAPAROSCOPY  11/25/2018   LAPAROSCOPY N/A 11/25/2018   Procedure: LAPAROSCOPY DIAGNOSTIC;  Surgeon: Mickeal Skinner, MD;  Location: [facility];  Service: General;  Laterality: N/A;   TONSILLECTOMY AND ADENOIDECTOMY  1953   UPPER GASTROINTESTINAL ENDOSCOPY  2003    Family History  Problem Relation Age of Onset   Heart attack Father 52   Diabetes Father    Coronary artery disease Father    COPD Mother    Hypertension Mother    Hypertension Sister    Pulmonary fibrosis Sister    Cancer Daughter        breast   Rheum arthritis Daughter    Colon cancer Neg Hx    Esophageal cancer Neg Hx    Rectal cancer Neg Hx     Social History   Socioeconomic History   Marital status: Married    Spouse name: Jori Moll   Number of children: 2   Years of education: Not on file   Highest education level: Not on file  Occupational History   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  Tobacco Use   Smoking status: Never    Passive exposure: Past   Smokeless tobacco: Never  Vaping Use   Vaping Use: Never used  Substance and Sexual Activity   Alcohol use: No   Drug use: No   Sexual activity: Not on file  Other Topics Concern   Not on file  Social History Narrative   Has living will   Husband, then daughters, are [place] health care POA   Would accept resuscitation attempts but no prolonged artifical ventilation   No prolonged tube feeds if cognitively impaired   Social  Determinants of Health   Financial Resource Strain: Not on file  Food Insecurity: Not on file  Transportation Needs: Not on file  Physical Activity: Not on file  Stress: Not on file  Social Connections: Not on file  Intimate Partner Violence: Not on file   Review of Systems Appetite is good Weight is stable Sleeps okay Wears seat belt Teeth okay--keeps up with dentist No suspicious skin lesions now Ongoing mild back and joint pains--no regular meds (rare ibuprofen)    Objective:   Physical Exam Constitutional:      Appearance: Normal appearance.  HENT:     Mouth/Throat:     Comments: No lesions Eyes:  Conjunctiva/sclera: Conjunctivae normal.     Pupils: Pupils are equal, round, and reactive to light.  Cardiovascular:     Rate and Rhythm: Normal rate and regular rhythm.     Pulses: Normal pulses.     Heart sounds: No murmur heard.    No gallop.  Pulmonary:     Effort: Pulmonary effort is normal.     Breath sounds: Normal breath sounds. No wheezing or rales.  Abdominal:     Palpations: Abdomen is soft.     Tenderness: There is no abdominal tenderness.  Musculoskeletal:     Cervical back: Neck supple.     Left lower leg: No edema.     Comments: 1+ right ankle edema only  Lymphadenopathy:     Cervical: No cervical adenopathy.  Skin:    Findings: No lesion or rash.  Neurological:     General: No focal deficit present.     Mental Status: She is alert and oriented to person, place, and time.     Comments: Mini-cog okay---clock good, recall 2/3  Psychiatric:        Mood and Affect: Mood normal.        Behavior: Behavior normal.            Assessment & Plan:

## 2022-07-30 NOTE — Assessment & Plan Note (Signed)
BP Readings from Last 3 Encounters:  07/30/22 112/74  07/18/22 136/72  02/17/22 138/86   Good when checked here On losartan 25 daily

## 2022-07-30 NOTE — Assessment & Plan Note (Addendum)
With transient diplopia Now on praulent and ASA 70m daily

## 2022-07-30 NOTE — Assessment & Plan Note (Signed)
Chronic dysthymia is stable No Rx indicated

## 2022-07-30 NOTE — Assessment & Plan Note (Signed)
Is on supplement  Will check level

## 2022-07-30 NOTE — Progress Notes (Signed)
Hearing Screening - Comments:: Has hearing aid in left ear. Deaf in right ear. Vision Screening - Comments:: March 2023

## 2022-07-30 NOTE — Assessment & Plan Note (Signed)
I have personally reviewed the Medicare Annual Wellness questionnaire and have noted 1. The patient's medical and social history 2. Their use of alcohol, tobacco or illicit drugs 3. Their current medications and supplements 4. The patient's functional ability including ADL's, fall risks, home safety risks and hearing or visual             impairment. 5. Diet and physical activities 6. Evidence for depression or mood disorders  The patients weight, height, BMI and visual acuity have been recorded in the chart I have made referrals, counseling and provided education to the patient based review of the above and I have provided the pt with a written personalized care plan for preventive services.  I have provided you with a copy of your personalized plan for preventive services. Please take the time to review along with your updated medication list.  Done with cancer screening Discussed exercise---walking/resistance Td at pharmacy--and consider shingrix Updated COVID and flu vaccines at the pharmacy

## 2022-07-30 NOTE — Assessment & Plan Note (Signed)
No striking progression Discussed exercise and social engagement

## 2022-08-06 NOTE — Telephone Encounter (Signed)
Faxed prescriber portion - Rx of praluent assistance app to [phone]

## 2022-09-02 DIAGNOSIS — H2513 Age-related nuclear cataract, bilateral: Secondary | ICD-10-CM | POA: Diagnosis not present

## 2022-09-08 ENCOUNTER — Encounter: Payer: Self-pay | Admitting: Internal Medicine

## 2022-09-08 MED ORDER — LOSARTAN POTASSIUM 25 MG PO TABS: 25.0000 mg | ORAL_TABLET | Freq: Every day | ORAL | 3 refills | Status: DC

## 2022-09-16 ENCOUNTER — Telehealth: Payer: Self-pay | Admitting: Internal Medicine

## 2022-09-16 NOTE — Telephone Encounter (Signed)
Patient called in and stated that someone had taking care of a bill that she had received. She stated that they told her everything was clear but she received another bill for the same thing and isn't sure why. Please advise. Thank you!

## 2022-10-14 ENCOUNTER — Ambulatory Visit (HOSPITAL_BASED_OUTPATIENT_CLINIC_OR_DEPARTMENT_OTHER): Payer: Medicare HMO | Admitting: Internal Medicine

## 2022-10-15 DIAGNOSIS — E785 Hyperlipidemia, unspecified: Secondary | ICD-10-CM | POA: Diagnosis not present

## 2022-10-16 LAB — NMR, LIPOPROFILE
Cholesterol, Total: 116 mg/dL (ref 100–199)
HDL Particle Number: 50 umol/L (ref >=30.5)
HDL-C: 61 mg/dL (ref >39)
LDL Particle Number: 400 nmol/L (ref <1000)
LDL Size: 19.8 nm — ABNORMAL LOW (ref >20.5)
LDL-C (NIH Calc): 31 mg/dL (ref 0–99)
LP-IR Score: 56 — ABNORMAL HIGH (ref <=45)
Small LDL Particle Number: 314 nmol/L (ref <=527)
Triglycerides: 146 mg/dL (ref 0–149)

## 2022-10-16 LAB — LIPOPROTEIN A (LPA): Lipoprotein (a): <8.4 nmol/L (ref <75.0)

## 2022-10-24 ENCOUNTER — Encounter (HOSPITAL_BASED_OUTPATIENT_CLINIC_OR_DEPARTMENT_OTHER): Payer: Self-pay | Admitting: Internal Medicine

## 2022-10-24 ENCOUNTER — Ambulatory Visit (HOSPITAL_BASED_OUTPATIENT_CLINIC_OR_DEPARTMENT_OTHER): Payer: Medicare HMO | Admitting: Internal Medicine

## 2022-10-24 VITALS — BP 168/70 | HR 69 | Ht 62.25 in | Wt 122.0 lb

## 2022-10-24 DIAGNOSIS — I447 Left bundle-branch block, unspecified: Secondary | ICD-10-CM

## 2022-10-24 DIAGNOSIS — E785 Hyperlipidemia, unspecified: Secondary | ICD-10-CM

## 2022-10-24 DIAGNOSIS — Z8249 Family history of ischemic heart disease and other diseases of the circulatory system: Secondary | ICD-10-CM

## 2022-10-24 DIAGNOSIS — T466X5D Adverse effect of antihyperlipidemic and antiarteriosclerotic drugs, subsequent encounter: Secondary | ICD-10-CM

## 2022-10-24 DIAGNOSIS — M791 Myalgia, unspecified site: Secondary | ICD-10-CM

## 2022-10-24 NOTE — Progress Notes (Signed)
LIPID CLINIC CONSULT NOTE  Chief Complaint:  Follow-up dyslipidemia  Primary Care Physician: Venia Carbon, MD  Primary Cardiologist:  None  HPI:  [name] is a 81 y.o. female who is being seen today for the evaluation of dyslipidemia at the request of Venia Carbon, MD. is a pleasant-year-old female, referred for management of dyslipidemia.  She has a history of aortic atherosclerosis, left bundle branch block and dyslipidemia.  She had a recent lipid profile in February 2023 which showed total cholesterol 225, triglycerides 121, HDL 55 and LDL 146.  Unfortunately she has been intolerant to statins which caused severe muscle pain.  She also has a first-degree relative (father) who had an MI at age 34.  10/24/2022  [name] returns today for follow-up.  She is doing quite well on Praluent.  She is on the 150 mg dose.  She denies any side effects such as the muscle pain she had with the statins.  Her cholesterol is responded nicely.  Her particle number is down to 400.  Total cholesterol 116, LDL 31, HDL 50 and triglycerides 156.  LP(a) was assessed and is negative at less than 8.4 nmol/L.  PMHx:  Past Medical History:  Diagnosis Date   Anxiety    Aortic atherosclerosis ([place])    GERD (gastroesophageal reflux disease)    Dr. 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 Theodoro Kalata (hard of hearing)    uses hearing aid on left side/complete loss of hearing right side   IBS (irritable bowel syndrome)    LBBB (left bundle branch block)    Migraine    OA (osteoarthritis)    Osteopenia 2005   Ulcer 2003   bleeding stomach ulcer    Past Surgical History:  Procedure Laterality Date   [place] N/A 01/20/2019   Procedure: DIAGNOSTIC LAP WITH SMALL BOWEL RESECTION;   Surgeon: Kieth Brightly Arta Bruce, MD;  Location: [facility];  Service: General;  Laterality: N/A;   [place]   okay, mild changes   CHOLECYSTECTOMY  1992   COLONOSCOPY  2003   DEXA N/A 2005   borderline osteopenia   DIAGNOSTIC LAPAROSCOPY  11/25/2018   LAPAROSCOPY N/A 11/25/2018   Procedure: LAPAROSCOPY DIAGNOSTIC;  Surgeon: Mickeal Skinner, MD;  Location: [facility];  Service: General;  Laterality: N/A;   TONSILLECTOMY AND ADENOIDECTOMY  1953   UPPER GASTROINTESTINAL ENDOSCOPY  2003    FAMHx:  Family History  Problem Relation Age of Onset   Heart attack Father 69   Diabetes Father    Coronary artery disease Father    COPD Mother    Hypertension Mother    Hypertension Sister    Pulmonary fibrosis Sister    Cancer Daughter        breast   Rheum arthritis Daughter    Colon cancer Neg Hx    Esophageal cancer Neg Hx    Rectal cancer Neg Hx     SOCHx:   reports that she has never smoked. She has been exposed to tobacco smoke. She has never used smokeless tobacco. She reports that she does not drink alcohol and does not use drugs.  ALLERGIES:  Allergies  Allergen Reactions   Clonazepam Swelling and Other (See Comments)    REACTION: Swollen lips, sore mouth   Penicillins Other (See Comments)    Unrecalled reaction Has patient had a PCN reaction causing immediate rash, facial/tongue/throat swelling, SOB or lightheadedness with hypotension: Unknown Has patient had a PCN reaction causing severe rash involving mucus membranes or skin necrosis: Unknown Has patient had a PCN reaction that required hospitalization: Unknown Has patient had a PCN reaction occurring within the last 10 years: No If all of the above answers are "NO", then may proceed with Cephalosporin use.    Statins Other (See Comments)    Severe muscle pain    ROS: Pertinent items noted in HPI and remainder of comprehensive ROS otherwise negative.  HOME MEDS: Current Outpatient Medications on  File Prior to Visit  Medication Sig Dispense Refill   Alirocumab (PRALUENT) 150 MG/ML SOAJ Inject 1 Dose into the skin every 14 (fourteen) days. 2 mL 11   aspirin EC 81 MG EC tablet Take 1 tablet (81 mg total) by mouth daily. Swallow whole. 30 tablet 11   baclofen (LIORESAL) 10 MG tablet Take 10 mg by mouth daily as needed. For headache     carbamide peroxide (DEBROX) 6.5 % OTIC solution Place 5 drops into the right ear daily as needed (for wax build up).     clorazepate (TRANXENE) 7.5 MG tablet Take 1 tablet (7.5 mg total) by mouth 2 (two) times daily as needed for anxiety. 20 tablet 0   losartan (COZAAR) 25 MG tablet Take 1 tablet (25 mg total) by mouth daily. 90 tablet 3   Multiple Vitamin (MULTIVITAMIN) tablet Take 1 tablet by mouth daily.     Potassium Gluconate 550 MG TABS Take 1,100 mg by mouth daily.     Probiotic Product (PROBIOTIC PO) Take 1 capsule by mouth daily.     vitamin B-12 (CYANOCOBALAMIN) 1000 MCG tablet Take 1,000 mcg by mouth daily.     zonisamide (ZONEGRAN) 100 MG capsule Take 100 mg by mouth daily.     No current facility-administered medications on file prior to visit.    LABS/IMAGING: No results found for this or any previous visit (from the past 48 hour(s)). No results found.  LIPID PANEL:    Component Value Date/Time   CHOL 154 07/30/2022 0957   TRIG 203.0 (H) 07/30/2022 0957   HDL 55.40 07/30/2022 0957   CHOLHDL 3 07/30/2022 0957   VLDL 40.6 (H) 07/30/2022 0957   LDLCALC 146 (H) 01/16/2022 0826   LDLDIRECT 82.0 07/30/2022 0957    WEIGHTS: Wt Readings from Last 3 Encounters:  10/24/22 122 lb (55.3 kg)  07/30/22 117 lb (53.1 kg)  07/18/22 117 lb 4.8 oz (53.2 kg)    VITALS: BP (!) 168/70 (BP Location: Left Arm, Patient Position: Sitting, Cuff Size: Normal)   Pulse 69   Ht 5' 2.25" (1.581 m)   Wt 122 lb (55.3 kg)   SpO2 95%   BMI 22.14 kg/m   EXAM: Deferred  EKG: Deferred  ASSESSMENT: Dyslipidemia, goal LDL less than 70 Statin  intolerant-myalgias First-degree relative with early onset heart disease Aortic atherosclerosis Left bundle branch block Negative LP(a)  PLAN: 1.   [name] has done quite well with regards to lipid lowering on Praluent.  She is tolerating it well without any side effects.  Her LDL is now much lower than target of less than 70.  Her LP(a) was negative.  Overall this is a very good news.  We will  continue with her current therapies.  Plan to repeat lipid NMR in about a year and follow-up with me afterwards.  Pixie Casino, MD, [facility], [place] Director of the Advanced Lipid Disorders &  Cardiovascular Risk Reduction Clinic Diplomate of the American Board of Clinical Lipidology Attending Cardiologist  Direct Dial: [phone]  Fax: [phone]  Website:  [email or web address] Kellene Mccleary 10/24/2022, 3:09 PM

## 2022-10-24 NOTE — Patient Instructions (Signed)
Medication Instructions:  NO CHANGES  *If you need a refill on your cardiac medications before your next appointment, please call your pharmacy*   Lab Work: FASTING NMR lipoprofile in 1 year   If you have labs (blood work) drawn today and your tests are completely normal, you will receive your results only by: [place] (if you have MyChart) OR A paper copy in the mail If you have any lab test that is abnormal or we need to change your treatment, we will call you to review the results.   Follow-Up: At [facility], you and your health needs are our priority.  As part of our continuing mission to provide you with exceptional heart care, we have created designated Provider Care Teams.  These Care Teams include your primary Cardiologist (physician) and Advanced Practice Providers (APPs -  Physician Assistants and Nurse Practitioners) who all work together to provide you with the care you need, when you need it.  We recommend signing up for the patient portal called "MyChart".  Sign up information is provided on this After Visit Summary.  MyChart is used to connect with patients for Virtual Visits (Telemedicine).  Patients are able to view lab/test results, encounter notes, upcoming appointments, etc.  Non-urgent messages can be sent to your provider as well.   To learn more about what you can do with MyChart, go to [email or web address].    Your next appointment:   12 month(s)  The format for your next appointment:   In Person  Provider:   Lyman Bishop MD

## 2022-11-24 ENCOUNTER — Ambulatory Visit: Payer: Medicare HMO | Admitting: Dermatology

## 2022-12-15 ENCOUNTER — Telehealth: Payer: Self-pay | Admitting: Internal Medicine

## 2022-12-15 MED ORDER — REPATHA SURECLICK 140 MG/ML ~~LOC~~ SOAJ: 1.0000 mL | SUBCUTANEOUS | 3 refills | Status: DC

## 2022-12-15 NOTE — Telephone Encounter (Signed)
Spoke with patient. Explained that [employer] changed formulary from [place] >> [place] for 2024. Advised will need to change Rx. She is OK with this. Rx(s) sent to pharmacy electronically. Will await PA and if needed, will submit

## 2022-12-15 NOTE — Telephone Encounter (Signed)
  Pt c/o medication issue:  1. Name of Medication:   Alirocumab (PRALUENT) 150 MG/ML SOAJ     2. How are you currently taking this medication (dosage and times per day)? As written   3. Are you having a reaction (difficulty breathing--STAT)?   4. What is your medication issue? Pt states she received a letter than this medication is no longer covered and Dr. Debara Pickett can ask for an exception.

## 2022-12-16 ENCOUNTER — Ambulatory Visit: Payer: Medicare HMO | Admitting: Dermatology

## 2022-12-16 NOTE — Telephone Encounter (Signed)
Medication approved  12/08/2022 - 12/08/2023

## 2022-12-16 NOTE — Telephone Encounter (Signed)
PA submitted 12/16/22 (Key: [id number])

## 2022-12-31 ENCOUNTER — Encounter: Payer: Self-pay | Admitting: Dermatology

## 2022-12-31 ENCOUNTER — Ambulatory Visit: Payer: Medicare HMO | Admitting: Dermatology

## 2022-12-31 VITALS — BP 158/70 | HR 69

## 2022-12-31 DIAGNOSIS — L900 Lichen sclerosus et atrophicus: Secondary | ICD-10-CM

## 2022-12-31 MED ORDER — TRIAMCINOLONE ACETONIDE 0.1 % EX OINT: TOPICAL_OINTMENT | CUTANEOUS | 4 refills | Status: DC

## 2022-12-31 NOTE — Progress Notes (Signed)
   Follow-Up Visit   Subjective  [name] is a 82 y.o. female who presents for the following: Follow-up (6 month recheck. LSetA. Using triamcinolone ointment as directed. Patient reports condition is doing well. Needs refills of TMC. Reports area is not itchy, denies tenderness).  Uses TMC about 3x/wk which keeps symptoms under control.    The following portions of the chart were reviewed this encounter and updated as appropriate:      Review of Systems: No other skin or systemic complaints except as noted in HPI or Assessment and Plan.   Objective  Well appearing patient in no apparent distress; mood and affect are within normal limits.  A focused examination was performed including vaginal area. Relevant physical exam findings are noted in the Assessment and Plan.  vaginal area Mild erythema at left inferior introitus and at left labia minora. No ulcerations, no scarring   Assessment & Plan  Lichen sclerosus et atrophicus vaginal area  Chronic condition with duration or expected duration over one year. Currently well-controlled.   Lichen sclerosus is a chronic inflammatory condition of unknown cause that frequently involves the vaginal area and less commonly extragenital skin, and is NOT sexually transmitted. It frequently causes symptoms of pain and burning.  It requires regular monitoring and treatment with topical steroids to minimize inflammation and to reduce risk of scarring. There is also a risk of cancer in the vaginal area which is very low if inflammation is well controlled. Regular checks of the area are recommended. Please call if you notice any new or changing spots within this area.  Continue Triamcinolone 0.1% ointment 3 times per week, increasing to daily as needed for flares.   Topical steroids (such as triamcinolone, fluocinolone, fluocinonide, mometasone, clobetasol, halobetasol, betamethasone, hydrocortisone) can cause thinning and lightening of the skin if  they are used for too long in the same area. Your physician has selected the right strength medicine for your problem and area affected on the body. Please use your medication only as directed by your physician to prevent side effects.    triamcinolone ointment (KENALOG) 0.1 % - vaginal area Apply three times weekly, increasing to daily for flares as needed   Return in about 6 months (around 07/01/2023) for LSetA follow up.  I, Emelia Salisbury, CMA, am acting as scribe for Brendolyn Patty, MD.  Documentation: I have reviewed the above documentation for accuracy and completeness, and I agree with the above.  Brendolyn Patty MD

## 2022-12-31 NOTE — Patient Instructions (Signed)
Continue Triamcinolone 0.1% ointment 3 times per week, increasing to daily as needed for flares.   Topical steroids (such as triamcinolone, fluocinolone, fluocinonide, mometasone, clobetasol, halobetasol, betamethasone, hydrocortisone) can cause thinning and lightening of the skin if they are used for too long in the same area. Your physician has selected the right strength medicine for your problem and area affected on the body. Please use your medication only as directed by your physician to prevent side effects.     Due to recent changes in healthcare laws, you may see results of your pathology and/or laboratory studies on MyChart before the doctors have had a chance to review them. We understand that in some cases there may be results that are confusing or concerning to you. Please understand that not all results are received at the same time and often the doctors may need to interpret multiple results in order to provide you with the best plan of care or course of treatment. Therefore, we ask that you please give [place] 2 business days to thoroughly review all your results before contacting the office for clarification. Should we see a critical lab result, you will be contacted sooner.   If You Need Anything After Your Visit  If you have any questions or concerns for your doctor, please call our main line at [phone] and press option 4 to reach your doctor's medical assistant. If no one answers, please leave a voicemail as directed and we will return your call as soon as possible. Messages left after 4 pm will be answered the following business day.   You may also send [place] a message via [place]. We typically respond to MyChart messages within 1-2 business days.  For prescription refills, please ask your pharmacy to contact our office. Our fax number is [phone].  If you have an urgent issue when the clinic is closed that cannot wait until the next business day, you can page your doctor at  the number below.    Please note that while we do our best to be available for urgent issues outside of office hours, we are not available 24/7.   If you have an urgent issue and are unable to reach [place], you may choose to seek medical care at your doctor's office, retail clinic, urgent care center, or emergency room.  If you have a medical emergency, please immediately call 911 or go to the emergency department.  Pager Numbers  - Dr. Nehemiah Massed: [phone]  - Dr. Laurence Ferrari: [phone]  - Dr. Nicole Kindred: [phone]  In the event of inclement weather, please call our main line at [phone] for an update on the status of any delays or closures.  Dermatology Medication Tips: Please keep the boxes that topical medications come in in order to help keep track of the instructions about where and how to use these. Pharmacies typically print the medication instructions only on the boxes and not directly on the medication tubes.   If your medication is too expensive, please contact our office at [phone] option 4 or send [place] a message through [place].   We are unable to tell what your co-pay for medications will be in advance as this is different depending on your insurance coverage. However, we may be able to find a substitute medication at lower cost or fill out paperwork to get insurance to cover a needed medication.   If a prior authorization is required to get your medication covered by your insurance company, please allow [place] 1-2 business days to  complete this process.  Drug prices often vary depending on where the prescription is filled and some pharmacies may offer cheaper prices.  The website www.goodrx.com contains coupons for medications through different pharmacies. The prices here do not account for what the cost may be with help from insurance (it may be cheaper with your insurance), but the website can give you the price if you did not use any insurance.  - You can print the  associated coupon and take it with your prescription to the pharmacy.  - You may also stop by our office during regular business hours and pick up a GoodRx coupon card.  - If you need your prescription sent electronically to a different pharmacy, notify our office through [facility] or by phone at [phone] option 4.     Si Usted Necesita Algo Despus de Su Visita  Tambin puede enviarnos un mensaje a travs de Pharmacist, community. Por lo general respondemos a los mensajes de MyChart en el transcurso de 1 a 2 das hbiles.  Para renovar recetas, por favor pida a su farmacia que se ponga en contacto con nuestra oficina. Harland Dingwall de fax es [place] [phone].  Si tiene un asunto urgente cuando la clnica est cerrada y que no puede esperar hasta el siguiente da hbil, puede llamar/localizar a su doctor(a) al nmero que aparece a continuacin.   Por favor, tenga en cuenta que aunque hacemos todo lo posible para estar disponibles para asuntos urgentes fuera del horario de [place], no estamos disponibles las 24 horas del da, los 7 das de la [place].   Si tiene un problema urgente y no puede comunicarse con nosotros, puede optar por buscar atencin mdica  en el consultorio de su doctor(a), en una clnica privada, en un centro de atencin urgente o en una sala de emergencias.  Si tiene Engineering geologist, por favor llame inmediatamente al 911 o vaya a la sala de emergencias.  Nmeros de bper  - Dr. Nehemiah Massed: [phone]  - Dra. Moye: [phone]  - Dra. Nicole Kindred: [phone]  En caso de inclemencias del [place], por favor llame a Johnsie Kindred principal al [phone] para una actualizacin sobre el [place] de cualquier retraso o cierre.  Consejos para la medicacin en dermatologa: Por favor, guarde las cajas en las que vienen los medicamentos de uso tpico para ayudarle a seguir las instrucciones sobre dnde y cmo usarlos. Las farmacias generalmente imprimen las instrucciones  del medicamento slo en las cajas y no directamente en los tubos del [place].   Si su medicamento es muy caro, por favor, pngase en contacto con Zigmund Daniel llamando al [phone] y presione la opcin 4 o envenos un mensaje a travs de Pharmacist, community.   No podemos decirle cul ser su copago por los medicamentos por adelantado ya que esto es diferente dependiendo de la cobertura de su seguro. Sin embargo, es posible que podamos encontrar un medicamento sustituto a Electrical engineer un formulario para que el seguro cubra el medicamento que se considera necesario.   Si se requiere una autorizacin previa para que su compaa de seguros [place] su medicamento, por favor permtanos de 1 a 2 das hbiles para completar este proceso.  Los precios de los medicamentos varan con frecuencia dependiendo del Environmental consultant de dnde se surte la receta y alguna farmacias pueden ofrecer precios ms baratos.  El sitio web www.goodrx.com tiene cupones para medicamentos de Airline pilot. Los precios aqu no tienen en cuenta lo que podra costar con la ayuda del  seguro (puede ser ms barato con su seguro), pero el sitio web puede darle el precio si no Field seismologist.  - Puede imprimir el cupn correspondiente y llevarlo con su receta a la farmacia.  - Tambin puede pasar por nuestra oficina durante el horario de atencin regular y Charity fundraiser una tarjeta de cupones de GoodRx.  - Si necesita que su receta se enve electrnicamente a una farmacia diferente, informe a nuestra oficina a travs de MyChart de [place] o por telfono llamando al [phone] y presione la opcin 4.

## 2023-01-14 DIAGNOSIS — G43719 Chronic migraine without aura, intractable, without status migrainosus: Secondary | ICD-10-CM | POA: Diagnosis not present

## 2023-02-02 ENCOUNTER — Emergency Department: Payer: Medicare HMO

## 2023-02-02 ENCOUNTER — Encounter: Payer: Self-pay | Admitting: Internal Medicine

## 2023-02-02 ENCOUNTER — Other Ambulatory Visit: Payer: Self-pay

## 2023-02-02 ENCOUNTER — Observation Stay
Admission: EM | Admit: 2023-02-02 | Discharge: 2023-02-03 | Disposition: A | Discharge status: Home / Self Care | Payer: Medicare HMO | Attending: Internal Medicine | Admitting: Internal Medicine

## 2023-02-02 DIAGNOSIS — I10 Essential (primary) hypertension: Principal | ICD-10-CM

## 2023-02-02 DIAGNOSIS — I6782 Cerebral ischemia: Secondary | ICD-10-CM | POA: Insufficient documentation

## 2023-02-02 DIAGNOSIS — R42 Dizziness and giddiness: Secondary | ICD-10-CM | POA: Diagnosis not present

## 2023-02-02 DIAGNOSIS — Z7982 Long term (current) use of aspirin: Secondary | ICD-10-CM | POA: Insufficient documentation

## 2023-02-02 DIAGNOSIS — Z8673 Personal history of transient ischemic attack (TIA), and cerebral infarction without residual deficits: Secondary | ICD-10-CM | POA: Insufficient documentation

## 2023-02-02 DIAGNOSIS — I639 Cerebral infarction, unspecified: Secondary | ICD-10-CM | POA: Diagnosis not present

## 2023-02-02 DIAGNOSIS — R202 Paresthesia of skin: Secondary | ICD-10-CM

## 2023-02-02 DIAGNOSIS — R200 Anesthesia of skin: Secondary | ICD-10-CM

## 2023-02-02 DIAGNOSIS — Z79899 Other long term (current) drug therapy: Secondary | ICD-10-CM | POA: Insufficient documentation

## 2023-02-02 DIAGNOSIS — E785 Hyperlipidemia, unspecified: Secondary | ICD-10-CM | POA: Diagnosis not present

## 2023-02-02 LAB — DIFFERENTIAL
Abs Immature Granulocytes: 0.02 10*3/uL (ref 0.00–0.07)
Basophils Absolute: 0 10*3/uL (ref 0.0–0.1)
Basophils Relative: 1 %
Eosinophils Absolute: 0.1 10*3/uL (ref 0.0–0.5)
Eosinophils Relative: 2 %
Immature Granulocytes: 0 %
Lymphocytes Relative: 28 %
Lymphs Abs: 1.6 10*3/uL (ref 0.7–4.0)
Monocytes Absolute: 0.3 10*3/uL (ref 0.1–1.0)
Monocytes Relative: 6 %
Neutro Abs: 3.5 10*3/uL (ref 1.7–7.7)
Neutrophils Relative %: 63 %

## 2023-02-02 LAB — URINALYSIS, ROUTINE W REFLEX MICROSCOPIC
Bacteria, UA: NONE SEEN
Bilirubin Urine: NEGATIVE
Glucose, UA: NEGATIVE mg/dL
Hgb urine dipstick: NEGATIVE
Ketones, ur: NEGATIVE mg/dL
Nitrite: NEGATIVE
Protein, ur: NEGATIVE mg/dL
Specific Gravity, Urine: 1.005 (ref 1.005–1.030)
pH: 6 (ref 5.0–8.0)

## 2023-02-02 LAB — COMPREHENSIVE METABOLIC PANEL
ALT: 16 U/L (ref 0–44)
AST: 23 U/L (ref 15–41)
Albumin: 3.8 g/dL (ref 3.5–5.0)
Alkaline Phosphatase: 80 U/L (ref 38–126)
Anion gap: 6 (ref 5–15)
BUN: 9 mg/dL (ref 8–23)
CO2: 23 mmol/L (ref 22–32)
Calcium: 8.7 mg/dL — ABNORMAL LOW (ref 8.9–10.3)
Chloride: 111 mmol/L (ref 98–111)
Creatinine, Ser: 0.83 mg/dL (ref 0.44–1.00)
GFR, Estimated: >60 mL/min (ref >60)
Glucose, Bld: 124 mg/dL — ABNORMAL HIGH (ref 70–99)
Potassium: 3.7 mmol/L (ref 3.5–5.1)
Sodium: 140 mmol/L (ref 135–145)
Total Bilirubin: 0.5 mg/dL (ref 0.3–1.2)
Total Protein: 6.3 g/dL — ABNORMAL LOW (ref 6.5–8.1)

## 2023-02-02 LAB — CBC
HCT: 42.7 % (ref 36.0–46.0)
Hemoglobin: 13.7 g/dL (ref 12.0–15.0)
MCH: 29.3 pg (ref 26.0–34.0)
MCHC: 32.1 g/dL (ref 30.0–36.0)
MCV: 91.2 fL (ref 80.0–100.0)
Platelets: 215 10*3/uL (ref 150–400)
RBC: 4.68 MIL/uL (ref 3.87–5.11)
RDW: 13.1 % (ref 11.5–15.5)
WBC: 5.7 10*3/uL (ref 4.0–10.5)
nRBC: 0 % (ref 0.0–0.2)

## 2023-02-02 LAB — PROTIME-INR
INR: 1 (ref 0.8–1.2)
Prothrombin Time: 12.8 seconds (ref 11.4–15.2)

## 2023-02-02 LAB — APTT: aPTT: 26 seconds (ref 24–36)

## 2023-02-02 MED ORDER — SENNA 8.6 MG PO TABS
1.0000 | ORAL_TABLET | Freq: Two times a day (BID) | ORAL | Status: DC
  Filled 2023-02-02: qty 1

## 2023-02-02 MED ORDER — METOPROLOL TARTRATE 5 MG/5ML IV SOLN: 5.0000 mg | Freq: Four times a day (QID) | INTRAVENOUS | Status: DC | PRN

## 2023-02-02 MED ORDER — ACETAMINOPHEN 325 MG PO TABS: 650.0000 mg | ORAL_TABLET | Freq: Four times a day (QID) | ORAL | Status: DC | PRN

## 2023-02-02 MED ORDER — SODIUM CHLORIDE 0.9 % IV SOLN: 2.0000 g | Freq: Once | INTRAVENOUS | Status: DC

## 2023-02-02 MED ORDER — AMLODIPINE BESYLATE 5 MG PO TABS
5.0000 mg | ORAL_TABLET | Freq: Every day | ORAL | Status: DC
  Administered 2023-02-02 – 2023-02-03 (×2): 5 mg via ORAL
  Filled 2023-02-02 (×2): qty 1

## 2023-02-02 MED ORDER — ASPIRIN 81 MG PO TBEC
81.0000 mg | DELAYED_RELEASE_TABLET | Freq: Every day | ORAL | Status: DC
  Administered 2023-02-03: 81 mg via ORAL
  Filled 2023-02-02 (×2): qty 1

## 2023-02-02 MED ORDER — ONDANSETRON HCL 4 MG PO TABS: 4.0000 mg | ORAL_TABLET | Freq: Four times a day (QID) | ORAL | Status: DC | PRN

## 2023-02-02 MED ORDER — CLINDAMYCIN PHOSPHATE 900 MG/50ML IV SOLN: 900.0000 mg | Freq: Once | INTRAVENOUS | Status: DC

## 2023-02-02 MED ORDER — ACETAMINOPHEN 650 MG RE SUPP: 650.0000 mg | Freq: Four times a day (QID) | RECTAL | Status: DC | PRN

## 2023-02-02 MED ORDER — AMLODIPINE BESYLATE 5 MG PO TABS: 10.0000 mg | ORAL_TABLET | Freq: Every day | ORAL | Status: DC

## 2023-02-02 MED ORDER — ONDANSETRON HCL 4 MG/2ML IJ SOLN: 4.0000 mg | Freq: Four times a day (QID) | INTRAMUSCULAR | Status: DC | PRN

## 2023-02-02 MED ORDER — LABETALOL HCL 5 MG/ML IV SOLN
10.0000 mg | Freq: Once | INTRAVENOUS | Status: AC
  Administered 2023-02-02: 10 mg via INTRAVENOUS
  Filled 2023-02-02: qty 4

## 2023-02-02 MED ORDER — ENOXAPARIN SODIUM 40 MG/0.4ML IJ SOSY
40.0000 mg | PREFILLED_SYRINGE | INTRAMUSCULAR | Status: DC
  Administered 2023-02-02: 40 mg via SUBCUTANEOUS
  Filled 2023-02-02: qty 0.4

## 2023-02-02 MED ORDER — BISACODYL 10 MG RE SUPP: 10.0000 mg | Freq: Every day | RECTAL | Status: DC | PRN

## 2023-02-02 MED ORDER — METOPROLOL TARTRATE 25 MG PO TABS
25.0000 mg | ORAL_TABLET | Freq: Two times a day (BID) | ORAL | Status: DC
  Administered 2023-02-02 – 2023-02-03 (×2): 25 mg via ORAL
  Filled 2023-02-02 (×2): qty 1

## 2023-02-02 NOTE — H&P (Signed)
History and Physical    Patient: [name] [id number] DOB: [birth year] DOA: 02/02/2023 DOS: the patient was seen and examined on 02/02/2023 PCP: Venia Carbon, MD  Patient coming from: Home  Chief Complaint:  Chief Complaint  Patient presents with   Dizziness   HPI: [name] is a 82 y.o. female with medical history significant of hypertension, anxiety, migraine, hyperlipidemia comes to the emergency room accompanied by her daughter at bedside in the ER with complaints of dizziness that started around 8:30 PM yesterday. Patient said she felt the walls were spinning. She held the walls to walk around the house to get to go to bed. She woke up around 230 in the morning and felt a slight right frontal headache along with right upper extremity tingling. She tried to rub it off and laid back to go to sleep. Workup in the morning feeling dizzy called her daughter who brought her to the emergency room.  In the ER patient was noted to have elevated blood pressure systolic anywhere from 0000000 to 180s. Workup in the ER showed MRI negative for stroke. 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 She takes baby aspirin. Patient has been taking losartan 25 mg daily. She to get this morning. Given her elevated/malignant hypertension will admit her for management of high blood pressure.  I have added metoprolol and low-dose amlodipine to her regimen.  Patient is denies any blurred vision or any focal weakness chest pain or shortness of breath. She denies any headache. Patient denies any upper respiratory infection or sinus congestion.  Review of Systems: As mentioned in the history of present illness. All other systems reviewed and are negative. Past Medical History:  Diagnosis Date   Anxiety    Aortic atherosclerosis ([place])    GERD (gastroesophageal reflux disease)    Dr. 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 Theodoro Kalata (hard of hearing)    uses hearing aid on left side/complete loss of hearing right side   IBS (irritable bowel syndrome)    LBBB (left bundle branch block)    Migraine    OA (osteoarthritis)    Osteopenia 2005   Ulcer 2003   bleeding stomach ulcer   Past Surgical History:  Procedure Laterality Date   [place] N/A 01/20/2019   Procedure: DIAGNOSTIC LAP WITH SMALL BOWEL RESECTION;  Surgeon: Kieth Brightly Arta Bruce, MD;  Location: [facility];  Service: General;  Laterality: N/A;   [place]   okay, mild changes   [place]  2003   DEXA N/A 2005   borderline osteopenia   DIAGNOSTIC LAPAROSCOPY  11/25/2018   LAPAROSCOPY N/A 11/25/2018   Procedure: LAPAROSCOPY DIAGNOSTIC;  Surgeon: Mickeal Skinner, MD;  Location: [facility];  Service: General;  Laterality: N/A;   [place] ENDOSCOPY  2003   Social History:  reports that she has never smoked. She has been exposed to tobacco smoke. She has never used smokeless tobacco. She reports that she does not drink alcohol and does not use drugs.  Allergies  Allergen Reactions   Clonazepam Swelling and Other (See Comments)  REACTION: Swollen lips, sore mouth   Penicillins Other (See Comments)    Unrecalled reaction Has patient had a PCN reaction causing immediate rash, facial/tongue/throat swelling, SOB or lightheadedness with hypotension: Unknown Has patient had a PCN reaction causing severe rash involving mucus membranes or skin necrosis: Unknown Has patient had a PCN reaction that required hospitalization: Unknown Has patient had a PCN reaction occurring within the last 10 years: No If all of the above answers are "NO", then may proceed with Cephalosporin use.    Statins Other (See Comments)    Severe  muscle pain    Family History  Problem Relation Age of Onset   Heart attack Father 55   Diabetes Father    Coronary artery disease Father    COPD Mother    Hypertension Mother    Hypertension Sister    Pulmonary fibrosis Sister    Cancer Daughter        breast   Rheum arthritis Daughter    Colon cancer Neg Hx    Esophageal cancer Neg Hx    Rectal cancer Neg Hx     Prior to Admission medications   Medication Sig Start Date End Date Taking? Authorizing Provider  aspirin EC 81 MG EC tablet Take 1 tablet (81 mg total) by mouth daily. Swallow whole. 01/17/22   Lorella Nimrod, MD  baclofen (LIORESAL) 10 MG tablet Take 10 mg by mouth daily as needed. For headache    [provider]  carbamide peroxide (DEBROX) 6.5 % OTIC solution Place 5 drops into the right ear daily as needed (for wax build up).    [provider]  clorazepate (TRANXENE) 7.5 MG tablet Take 1 tablet (7.5 mg total) by mouth 2 (two) times daily as needed for anxiety. 11/29/21   Venia Carbon, MD  Evolocumab (REPATHA SURECLICK) [id number] MG/ML SOAJ Inject 140 mg into the skin every 14 (fourteen) days. 12/15/22   Hilty, Nadean Corwin, MD  losartan (COZAAR) 25 MG tablet Take 1 tablet (25 mg total) by mouth daily. 09/08/22   Venia Carbon, MD  Multiple Vitamin (MULTIVITAMIN) tablet Take 1 tablet by mouth daily.    [provider]  Potassium Gluconate 550 MG TABS Take 1,100 mg by mouth daily.    [provider]  Probiotic Product (PROBIOTIC PO) Take 1 capsule by mouth daily.    [provider]  triamcinolone ointment (KENALOG) 0.1 % Apply three times weekly, increasing to daily for flares as needed 12/31/22   Brendolyn Patty, MD  vitamin B-12 (CYANOCOBALAMIN) 1000 MCG tablet Take 1,000 mcg by mouth daily.    [provider]  zonisamide (ZONEGRAN) 100 MG capsule Take 100 mg by mouth daily.    [provider]    Physical Exam: Vitals:   02/02/23 1500 02/02/23 1600 02/02/23  1620 02/02/23 1715  BP: (!) 182/68 (!) 126/106  (!) 188/82  Pulse: (!) 58 63  69  Resp: '15 17  17  '$ Temp:   98 F (36.7 C)   TempSrc:   Oral   SpO2: 100% 98%  100%   Physical Exam Constitutional:      Appearance: Normal appearance.  HENT:     Head: Normocephalic and atraumatic.  Eyes:     Conjunctiva/sclera: Conjunctivae normal.     Pupils: Pupils are equal, round, and reactive to light.  Cardiovascular:     Rate and Rhythm: Normal rate and regular rhythm.  Pulmonary:     Effort: Pulmonary effort is normal.  Breath sounds: Normal breath sounds.  Abdominal:     General: Abdomen is flat. Bowel sounds are normal.  Musculoskeletal:        General: Normal range of motion.  Skin:    General: Skin is warm and dry.  Neurological:     General: No focal deficit present.     Mental Status: She is alert and oriented to person, place, and time.      Assessment and Plan:  NHYIRA CONLIFFE is a 82 y.o. female with medical history significant of hypertension, anxiety, migraine, hyperlipidemia comes to the emergency room accompanied by her daughter at bedside in the ER with complaints of dizziness that started around 8:30 PM yesterday. Patient said she felt the walls were spinning. She held the walls to walk around the house to get to go to bed. She woke up around 230 in the morning and felt a slight right frontal headache along with right upper extremity tingling. She tried to rub it off and laid back to go to sleep. Workup in the morning feeling dizzy called her daughter who brought her to the emergency room.  Malignant hypertension with dizziness -- admit for overnight observation -- low-salt diet -- continue losartan -- added low-dose amlodipine 5 mg and metoprolol 25 BID -- will adjust blood pressure meds according to BP -- echo done in 2023 noted -- discussed with patient and daughter regarding keeping log of blood pressure reading more frequent ones at home.  Hyperlipidemia --  patient takes repatha q 2 weeks  History of CVA -- continue aspirin  History of migraine headaches -- continue Zoengran for prophylaxis   Advance Care Planning:   Code Status: Full Code confirm with patient in the ER  Consults: none  Family Communication: daughter at bedside  Severity of Illness: The appropriate patient status for this patient is OBSERVATION. Observation status is judged to be reasonable and necessary in order to provide the required intensity of service to ensure the patient's safety. The patient's presenting symptoms, physical exam findings, and initial radiographic and laboratory data in the context of their medical condition is felt to place them at decreased risk for further clinical deterioration. Furthermore, it is anticipated that the patient will be medically stable for discharge from the hospital within 2 midnights of admission.   Author: Fritzi Mandes, MD 02/02/2023 5:33 PM  For on call review [email or web address].

## 2023-02-02 NOTE — ED Notes (Signed)
Pt up to bathroom with assistance.  Meds given  family with pt  pt waiting on bed assignment.  Pt alert  speech clear.

## 2023-02-02 NOTE — ED Triage Notes (Addendum)
Pt to ED via Pov form home with daughter. Pt reports last night around 8pm she started feeling dizzy. Pt reports still feels dizzy but has improved. Pt also endorse mild HA on right side and tingling on right side of face, arm and leg that started during the night around 3:30am. Pt states was not tingling in the face when she left the house at 11am. Pt reports hx of stroke. Daughter states speech seems normal but does appeared more delayed.    RN consulted MD Mumma for orders.

## 2023-02-02 NOTE — ED Notes (Signed)
Report given to emily,rn

## 2023-02-02 NOTE — ED Provider Notes (Signed)
[facility] Provider Note    Event Date/Time   First MD Initiated Contact with Patient 02/02/23 1220     (approximate)   History   Chief Complaint Dizziness   HPI  [name] is a 82 y.o. female with past medical history of hyperlipidemia, stroke, Crohn's disease, migraines, and left bundle branch block who presents to the ED complaining of dizziness.  Patient reports that she began to feel dizzy with a sensation that the room is spinning around her last night just after dinnertime.  She states that the symptoms seem to be worse when she stood up and walked, got better when she laid down for bed.  She then woke up in the middle of the night around 2:30 AM feeling "foggy headed."  This was associated with some numbness and tingling in the right side of her face as well as her right arm and leg.  She states she felt like she was slightly weak in her right arm and leg at that time too.  The weakness has seemed to improve but she continues to complain of tingling in her arm and leg, tingling in her face has also improved.  Dizziness is still present but minimal while she is laying still on the stretcher.  She denies any fevers, cough, nausea, vomiting, chest pain, or shortness of breath.     Physical Exam   Triage Vital Signs: ED Triage Vitals  Enc Vitals Group     BP 02/02/23 1123 (!) 172/83     Pulse Rate 02/02/23 1123 69     Resp 02/02/23 1123 18     Temp 02/02/23 1123 98 F (36.7 C)     Temp Source 02/02/23 1123 Oral     SpO2 02/02/23 1123 99 %     Weight --      Height --      Head Circumference --      Peak Flow --      Pain Score 02/02/23 1124 3     Pain Loc --      Pain Edu? --      Excl. in [place]? --     Most recent vital signs: Vitals:   02/02/23 1123 02/02/23 1500  BP: (!) 172/83 (!) 182/68  Pulse: 69 (!) 58  Resp: 18 15  Temp: 98 F (36.7 C)   SpO2: 99% 100%    Constitutional: Alert and oriented. Eyes: Conjunctivae are  normal. Head: Atraumatic. Nose: No congestion/rhinnorhea. Mouth/Throat: Mucous membranes are moist.  Cardiovascular: Normal rate, regular rhythm. Grossly normal heart sounds.  2+ radial pulses bilaterally. Respiratory: Normal respiratory effort.  No retractions. Lungs CTAB. Gastrointestinal: Soft and nontender. No distention. Musculoskeletal: No lower extremity tenderness nor edema.  Neurologic:  Normal speech and language. No gross focal neurologic deficits are appreciated.    ED Results / Procedures / Treatments   Labs (all labs ordered are listed, but only abnormal results are displayed) Labs Reviewed  COMPREHENSIVE METABOLIC PANEL - Abnormal; Notable for the following components:      Result Value   Glucose, Bld 124 (*)    Calcium 8.7 (*)    Total Protein 6.3 (*)    All other components within normal limits  PROTIME-INR  APTT  CBC  DIFFERENTIAL  URINALYSIS, ROUTINE W REFLEX MICROSCOPIC     EKG  ED ECG REPORT I, Blake Divine, the attending physician, personally viewed and interpreted this ECG.   Date: 02/02/2023  EKG Time: 11:18  Rate:  77  Rhythm: normal sinus rhythm  Axis: Normal  Intervals:left bundle branch block  ST&T Change: None  RADIOLOGY CT head reviewed and interpreted by me with no hemorrhage or midline shift.  PROCEDURES:  Critical Care performed: No  Procedures   MEDICATIONS ORDERED IN ED: Medications - No data to display   IMPRESSION / MDM / [place] / ED COURSE  I reviewed the triage vital signs and the nursing notes.                              82 y.o. female with past medical history of hyperlipidemia, stroke, Crohn's disease, migraines, and left middle branch block who presents to the ED complaining of dizziness and sensation of the room spinning around her since dinnertime last night, developed numbness and tingling as well as mild weakness of her right side at 2:30 AM.  Patient's presentation is most consistent with  acute presentation with potential threat to life or bodily function.  Differential diagnosis includes, but is not limited to, stroke, TIA, peripheral vertigo, paresthesia, electrolyte abnormality, anemia.  Patient nontoxic-appearing and in no acute distress, vital signs are unremarkable.  She does not have any objective focal neurologic deficits on my exam, I am not able to pick up on any weakness in her arms or legs, no facial droop noted.  She states that her numbness and tingling is improved, describe dizziness sounds more consistent with a peripheral vertigo.  CT head is negative for acute process, but I do feel patient would benefit from MRI brain to rule out stroke. Labs are reassuring with no significant anemia, leukocytosis, lecture abnormality, or AKI.  LFTs are unremarkable, low suspicion for UTI given no urinary symptoms.  Patient turned over to oncoming provider pending MRI results, if these are unremarkable plan for discharge home with outpatient follow-up for suspected peripheral vertigo.        FINAL CLINICAL IMPRESSION(S) / ED DIAGNOSES   Final diagnoses:  Dizziness  Numbness and tingling     Rx / DC Orders   ED Discharge Orders     None        Note:  This document was prepared using Dragon voice recognition software and may include unintentional dictation errors.   Blake Divine, MD 02/02/23 1537

## 2023-02-02 NOTE — ED Notes (Signed)
Pt alert,  sinus on monitor.  Family with pt. Speech clear.   Pt reports a headache.

## 2023-02-02 NOTE — Telephone Encounter (Signed)
I spoke with pt; 02/01/23 pt felt slightly dizzy and sleep. Pt got up at 12 :30 AM to use restroom and was still dizzy and tngling in rt arm. Pt slept rest of night. 02/02/23 at 7:15 AM BP 180/88 and pt took BP med and ASA 81 mg. At 8:30 am pt took meclizine. now at 10:15 AM pt is laying down and pt does not feel bad but as we spoke more symptoms were noted;;pt feels little "woozy" ; room is not spinning but pt has cotton head feeling. Pt is feeling weak, tingling all over, pt does not feel like she should try and walk due to wooziness. Judeen Hammans is with pt; at 9:40 AM BP 172/70 P 65. Pt feels unstable when stands up and has not gotten up since Alicia got there at 9:40. Pt said around 9 AM started  H/A above right eye and rt temple area. Pt has hx of stroke per pt. Pt said she takes zonisamide 100 mg one daily and pt has not missed taking med. Pt is at a 45 degree angle while laying in bed. No CP and no SOB.pt said some weakness in rt arm and leg. I spoke with Dr Silvio Pate and he advised that pt goes to ED for eval and possible imaging due to concern of possible CVA. Pt voiced understanding and Judeen Hammans said she can get pt to car and will take pt to [facility] ED now. Sending note to Dr Silvio Pate and Silvio Pate pool.

## 2023-02-02 NOTE — Telephone Encounter (Signed)
She is there but evaluation is pending I will await their work/up//impressions

## 2023-02-02 NOTE — ED Notes (Addendum)
Resumed care from [facility].  Pt to mri

## 2023-02-02 NOTE — ED Provider Notes (Addendum)
I got signout on this patient pending MRI to evaluate for vertigo. Plan will be for reevaluation after MRI for disposition decisions.  If MRI negative and patient reevaluation is amenable for discharge, she will discharge with PMD follow-up otherwise admission for PT/OT and rehab  Persistent dizziness needs assistance to go to the bathroom, ambulates at baseline by herself normally.  MRI is negative.  Labs negative.  No urinary symptoms to suggest urinary tract infection, UA is pending.  She is hypertensive [id number] systolic in the 123456 systolic on recheck she did take her blood pressure medicines this morning.  Will give IV labetalol 10 mg now, admit for persistent dizziness and hypertensive crisis.   Lucillie Garfinkel, MD 02/02/23 1524    Lucillie Garfinkel, MD 02/02/23 [phone]

## 2023-02-03 DIAGNOSIS — E785 Hyperlipidemia, unspecified: Secondary | ICD-10-CM | POA: Diagnosis not present

## 2023-02-03 DIAGNOSIS — R42 Dizziness and giddiness: Secondary | ICD-10-CM | POA: Diagnosis not present

## 2023-02-03 DIAGNOSIS — I10 Essential (primary) hypertension: Secondary | ICD-10-CM | POA: Diagnosis not present

## 2023-02-03 MED ORDER — AMLODIPINE BESYLATE 5 MG PO TABS: 5.0000 mg | ORAL_TABLET | Freq: Every day | ORAL | 1 refills | Status: DC

## 2023-02-03 MED ORDER — METOPROLOL TARTRATE 25 MG PO TABS: 25.0000 mg | ORAL_TABLET | Freq: Two times a day (BID) | ORAL | 1 refills | Status: DC

## 2023-02-03 NOTE — Discharge Instructions (Signed)
Keep log of BP at home for Dr Silvio Pate to review

## 2023-02-03 NOTE — Discharge Summary (Signed)
Physician Discharge Summary   Patient: [name] MRN: [id number] DOB: [birth year]  Admit date:     02/02/2023  Discharge date: 02/03/23  Discharge Physician: Fritzi Mandes   PCP: Venia Carbon, MD   Recommendations at discharge:    Keep log of BP at home for PCP to review  Discharge Diagnoses: Principal Problem:   HTN (hypertension), malignant Active Problems:   Dizziness  [name] is a 82 y.o. female with medical history significant of hypertension, anxiety, migraine, hyperlipidemia comes to the emergency room accompanied by her daughter at bedside in the ER with complaints of dizziness that started around 8:30 PM yesterday. Patient said she felt the walls were spinning. She held the walls to walk around the house to get to go to bed. She woke up around 230 in the morning and felt a slight right frontal headache along with right upper extremity tingling. She tried to rub it off and laid back to go to sleep. Workup in the morning feeling dizzy called her daughter who brought her to the emergency room.   Malignant hypertension with dizziness -- low-salt diet -- continue losartan -- added low-dose amlodipine 5 mg and metoprolol 25 BID--bp much improved -- will adjust blood pressure meds according to BP -- echo done in 2023 noted -- discussed with patient and daughter regarding keeping log of blood pressure reading  at home and review with PCP. --pt ambulated with MT feels more safe with RW. Dter can provide one for her mom.   Hyperlipidemia -- patient takes repatha q 2 weeks   History of CVA -- continue aspirin   History of migraine headaches -- continue Zoengran for prophylaxis  Overall improving. D/w pt and daughter at bedside d/c plans. agreeable       Consultants: none Disposition: Home Diet recommendation:  Discharge Diet Orders (From admission, onward)     Start     Ordered   02/03/23 0000  Diet - low sodium heart healthy        02/03/23 1017            Cardiac diet DISCHARGE MEDICATION: Allergies as of 02/03/2023       Reactions   Clonazepam Swelling, Other (See Comments)   REACTION: Swollen lips, sore mouth   Penicillins Other (See Comments)   Unrecalled reaction Has patient had a PCN reaction causing immediate rash, facial/tongue/throat swelling, SOB or lightheadedness with hypotension: Unknown Has patient had a PCN reaction causing severe rash involving mucus membranes or skin necrosis: Unknown Has patient had a PCN reaction that required hospitalization: Unknown Has patient had a PCN reaction occurring within the last 10 years: No If all of the above answers are "NO", then may proceed with Cephalosporin use.   Statins Other (See Comments)   Severe muscle pain        Medication List     TAKE these medications    amLODipine 5 MG tablet Commonly known as: NORVASC Take 1 tablet (5 mg total) by mouth daily. Start taking on: February 04, 2023   aspirin EC 81 MG tablet Take 1 tablet (81 mg total) by mouth daily. Swallow whole.   baclofen 10 MG tablet Commonly known as: LIORESAL Take 10 mg by mouth daily as needed. For headache   carbamide peroxide 6.5 % OTIC solution Commonly known as: DEBROX Place 5 drops into the right ear daily as needed (for wax build up).   clorazepate 7.5 MG tablet Commonly known as: TRANXENE Take 1 tablet (  7.5 mg total) by mouth 2 (two) times daily as needed for anxiety.   cyanocobalamin 1000 MCG tablet Commonly known as: VITAMIN B12 Take 1,000 mcg by mouth daily.   losartan 25 MG tablet Commonly known as: COZAAR Take 1 tablet (25 mg total) by mouth daily.   metoprolol tartrate 25 MG tablet Commonly known as: LOPRESSOR Take 1 tablet (25 mg total) by mouth 2 (two) times daily.   multivitamin tablet Take 1 tablet by mouth daily.   Potassium Gluconate 550 MG Tabs Take 1,100 mg by mouth daily.   PROBIOTIC PO Take 1 capsule by mouth daily.   Repatha SureClick [id number] MG/ML  Soaj Generic drug: Evolocumab Inject 140 mg into the skin every 14 (fourteen) days.   triamcinolone ointment 0.1 % Commonly known as: KENALOG Apply three times weekly, increasing to daily for flares as needed   zonisamide 100 MG capsule Commonly known as: ZONEGRAN Take 100 mg by mouth daily.        Follow-up Information     [facility] Emergency Department at [facility] .   Specialty: Emergency Medicine Why: If symptoms worsen Contact information: [id number] [place]        Venia Carbon, MD. Schedule an appointment as soon as possible for a visit in 1 week(s).   Specialties: Internal Medicine, Pediatrics Why: for HTN f/u Contact information: [place] [address] [phone]                BP 147/62   Condition at discharge: fair  The results of significant diagnostics from this hospitalization (including imaging, microbiology, ancillary and laboratory) are listed below for reference.   Imaging Studies: MR BRAIN WO CONTRAST  Result Date: 02/02/2023 CLINICAL DATA:  Stroke suspected EXAM: MRI HEAD WITHOUT CONTRAST TECHNIQUE: Multiplanar, multiecho pulse sequences of the brain and surrounding structures were obtained without intravenous contrast. COMPARISON:  MRI Brain 05/06/22, 03/12/21, 03/05/22, CT head 02/02/23 FINDINGS: Brain: No acute infarction, hemorrhage, hydrocephalus, extra-axial collection or mass lesion. Sequela of mild chronic microvascular ischemic change. Chronic left thalamic infarct. Unchanged small region of T2/FLAIR hyperintense signal abnormality in the periventricular left temporal lobe (series 15, image 21). This was present on MRI brain dated 03/12/21. Vascular: Normal flow voids. No left ICA aneurysm is not visualized on this exam. Skull and upper cervical spine: Normal marrow signal. Sinuses/Orbits: No mastoid or middle ear effusion. Right maxillary  sinus mucous retention cyst. Orbits are unremarkable. Other: None. IMPRESSION: No acute intracranial abnormality. Electronically Signed   By: Marin Roberts M.D.   On: 02/02/2023 15:41   CT HEAD WO CONTRAST  Result Date: 02/02/2023 CLINICAL DATA:  stroke EXAM: CT HEAD WITHOUT CONTRAST TECHNIQUE: Contiguous axial images were obtained from the base of the skull through the vertex without intravenous contrast. RADIATION DOSE REDUCTION: This exam was performed according to the departmental dose-optimization program which includes automated exposure control, adjustment of the mA and/or kV according to patient size and/or use of iterative reconstruction technique. COMPARISON:  CT head February 8, 23. FINDINGS: Brain: Remote left thalamic infarct. No evidence of acute large vascular territory infarct, acute hemorrhage, mass lesion or midline shift. No hydrocephalus. Vascular: No hyperdense vessel.  Calcific atherosclerosis. Skull: No acute fracture. Sinuses/Orbits: Clear sinuses.  No acute orbital findings. Other: No mastoid effusions. IMPRESSION: No evidence of acute intracranial abnormality. Electronically Signed   By: Margaretha Sheffield M.D.   On: 02/02/2023 12:54    Microbiology: Results  for orders placed or performed during the hospital encounter of 01/15/22  Resp Panel by RT-PCR (Flu A&B, Covid) Nasopharyngeal Swab     Status: None   Collection Time: 01/15/22 11:20 PM   Specimen: Nasopharyngeal Swab; Nasopharyngeal(NP) swabs in vial transport medium  Result Value Ref Range Status   SARS Coronavirus 2 by RT PCR NEGATIVE NEGATIVE Final    Comment: (NOTE) SARS-CoV-2 target nucleic acids are NOT DETECTED.  The SARS-CoV-2 RNA is generally detectable in upper respiratory specimens during the acute phase of infection. The lowest concentration of SARS-CoV-2 viral copies this assay can detect is 138 copies/mL. A negative result does not preclude SARS-Cov-2 infection and should not be used as the sole basis  for treatment or other patient management decisions. A negative result may occur with  improper specimen collection/handling, submission of specimen other than nasopharyngeal swab, presence of viral mutation(s) within the areas targeted by this assay, and inadequate number of viral copies(<138 copies/mL). A negative result must be combined with clinical observations, patient history, and epidemiological information. The expected result is Negative.  Fact Sheet for Patients:  EntrepreneurPulse.com.au  Fact Sheet for Healthcare Providers:  IncredibleEmployment.be  This test is no t yet approved or cleared by the [place] FDA and  has been authorized for detection and/or diagnosis of SARS-CoV-2 by FDA under an Emergency Use Authorization (EUA). This EUA will remain  in effect (meaning this test can be used) for the duration of the COVID-19 declaration under Section 564(b)(1) of the Act, 21 U.S.C.section 360bbb-3(b)(1), unless the authorization is terminated  or revoked sooner.       Influenza A by PCR NEGATIVE NEGATIVE Final   Influenza B by PCR NEGATIVE NEGATIVE Final    Comment: (NOTE) The Xpert Xpress SARS-CoV-2/FLU/RSV plus assay is intended as an aid in the diagnosis of influenza from Nasopharyngeal swab specimens and should not be used as a sole basis for treatment. Nasal washings and aspirates are unacceptable for Xpert Xpress SARS-CoV-2/FLU/RSV testing.  Fact Sheet for Patients: EntrepreneurPulse.com.au  Fact Sheet for Healthcare Providers: IncredibleEmployment.be  This test is not yet approved or cleared by the [place] FDA and has been authorized for detection and/or diagnosis of SARS-CoV-2 by FDA under an Emergency Use Authorization (EUA). This EUA will remain in effect (meaning this test can be used) for the duration of the COVID-19 declaration under Section 564(b)(1) of the Act, 21  U.S.C. section 360bbb-3(b)(1), unless the authorization is terminated or revoked.  Performed at [facility], [address] [place], [place] [address]     Labs: CBC: Recent Labs  Lab 02/02/23 1123  WBC 5.7  NEUTROABS 3.5  HGB 13.7  HCT 42.7  MCV 91.2  PLT 123456   Basic Metabolic Panel: Recent Labs  Lab 02/02/23 1123  NA 140  K 3.7  CL 111  CO2 23  GLUCOSE 124*  BUN 9  CREATININE 0.83  CALCIUM 8.7*   Liver Function Tests: Recent Labs  Lab 02/02/23 1123  AST 23  ALT 16  ALKPHOS 80  BILITOT 0.5  PROT 6.3*  ALBUMIN 3.8    Discharge time spent: greater than 30 minutes.  Signed: Fritzi Mandes, MD [facility] 02/03/2023

## 2023-02-03 NOTE — Progress Notes (Signed)
Mobility Specialist - Progress Note  Post-mobility: HR(147/62)    02/03/23 1000  Therapy Vitals  BP (!) 147/62  Mobility  Activity Ambulated with assistance in hallway;Stood at bedside;Dangled on edge of bed  Level of Assistance Contact guard assist, steadying assist  Assistive Device Front wheel walker  Distance Ambulated (ft) 240 ft  Range of Motion/Exercises Active  Activity Response Tolerated well  Mobility Referral Yes  $Mobility charge 1 Mobility    Pt resting in bed on RA upon entry. Pt STS and ambulates to hallway HHA for first lap around NS CGA. Pt had dizziness when standing and a little instability. Pt used RW for second lap and gait/stability improved and pt still endorsed dizziness during second lap. Pt returned to bed and left with needs in reach. Pt daughter present at bedside.   Loma Sender Mobility Specialist 02/03/23, 10:29 AM

## 2023-02-10 ENCOUNTER — Ambulatory Visit (INDEPENDENT_AMBULATORY_CARE_PROVIDER_SITE_OTHER): Payer: Medicare HMO | Admitting: Family Medicine

## 2023-02-10 ENCOUNTER — Encounter: Payer: Self-pay | Admitting: Family Medicine

## 2023-02-10 VITALS — BP 128/70 | HR 66 | Temp 98.1°F | Ht 62.25 in | Wt 117.0 lb

## 2023-02-10 DIAGNOSIS — I10 Essential (primary) hypertension: Secondary | ICD-10-CM | POA: Diagnosis not present

## 2023-02-10 MED ORDER — METOPROLOL TARTRATE 25 MG PO TABS: 25.0000 mg | ORAL_TABLET | Freq: Two times a day (BID) | ORAL | 1 refills | Status: DC

## 2023-02-10 MED ORDER — AMLODIPINE BESYLATE 5 MG PO TABS: 5.0000 mg | ORAL_TABLET | Freq: Every day | ORAL | 1 refills | Status: DC

## 2023-02-10 NOTE — Progress Notes (Unsigned)
Inpatient f/u.  Admitted with HTN and vertigo, paresthesias.  Neg imaging.  No clear trigger for BP elevation.  She had not missed any medication or had any other recent events that she knew of that would have caused her blood pressure elevation.  Inpatient course discussed with patient.  Since discharge she has had no CP.  Not SOB.  No new BLE edema, she has h/o R ankle edema at baseline. Not usually lightheaded on standing.  Cautions d/w pt.  Vertigo is better.    She has a blood pressure log from home.  Her blood pressure has been usually [id number] to [id number] systolic and 123456 to 0000000 diastolic recently.  Her systolic values have been on the lower end of that range recently.  Discussed rationale for amlodipine versus metoprolol versus losartan use.  Meds, vitals, and allergies reviewed.   ROS: Per HPI unless specifically indicated in ROS section   GEN: nad, alert and oriented HEENT: ncat NECK: supple w/o LA CV: rrr.  PULM: ctab, no inc wob ABD: soft, +bs EXT: no edema SKIN: no acute rash Minimal droop R eyebrow- old finding per patient report.

## 2023-02-10 NOTE — Patient Instructions (Signed)
I'll update Dr. Silvio Pate.   Take care.  Glad to see you.  If you feel well, BP is controlled and pulse is between 60 and 90, then continue as is.   If lightheaded, then check pulse and BP.  If pulse <60, then stop metoprolol.  If pulse above 60, then stop amlodipine and update [place].   If BP is persistently above 160/90, then check pulse and update [place].

## 2023-02-11 NOTE — Assessment & Plan Note (Signed)
Blood pressure is reasonable now.  Routine cautions given to patient.  We did not need to recheck labs today. If still feeling well, BP is controlled and pulse is between 60 and 90, then continue as is.   If lightheaded, then check pulse and BP.  If pulse <60, then stop metoprolol.  If pulse above 60, then stop amlodipine and update [place].   If BP is persistently above 160/90, then check pulse and update [place].    Routed to PCP as FYI.

## 2023-02-13 ENCOUNTER — Encounter: Payer: Self-pay | Admitting: Family Medicine

## 2023-02-27 DIAGNOSIS — R69 Illness, unspecified: Secondary | ICD-10-CM | POA: Diagnosis not present

## 2023-02-27 DIAGNOSIS — H259 Unspecified age-related cataract: Secondary | ICD-10-CM | POA: Diagnosis not present

## 2023-02-27 DIAGNOSIS — G3184 Mild cognitive impairment, so stated: Secondary | ICD-10-CM | POA: Diagnosis not present

## 2023-02-27 DIAGNOSIS — F324 Major depressive disorder, single episode, in partial remission: Secondary | ICD-10-CM | POA: Diagnosis not present

## 2023-02-27 DIAGNOSIS — I10 Essential (primary) hypertension: Secondary | ICD-10-CM | POA: Diagnosis not present

## 2023-02-27 DIAGNOSIS — Z008 Encounter for other general examination: Secondary | ICD-10-CM | POA: Diagnosis not present

## 2023-02-27 DIAGNOSIS — Z8249 Family history of ischemic heart disease and other diseases of the circulatory system: Secondary | ICD-10-CM | POA: Diagnosis not present

## 2023-02-27 DIAGNOSIS — Z803 Family history of malignant neoplasm of breast: Secondary | ICD-10-CM | POA: Diagnosis not present

## 2023-02-27 DIAGNOSIS — L900 Lichen sclerosus et atrophicus: Secondary | ICD-10-CM | POA: Diagnosis not present

## 2023-02-27 DIAGNOSIS — R32 Unspecified urinary incontinence: Secondary | ICD-10-CM | POA: Diagnosis not present

## 2023-02-27 DIAGNOSIS — Z825 Family history of asthma and other chronic lower respiratory diseases: Secondary | ICD-10-CM | POA: Diagnosis not present

## 2023-02-27 DIAGNOSIS — E785 Hyperlipidemia, unspecified: Secondary | ICD-10-CM | POA: Diagnosis not present

## 2023-02-27 DIAGNOSIS — I700 Atherosclerosis of aorta: Secondary | ICD-10-CM | POA: Diagnosis not present

## 2023-02-27 DIAGNOSIS — K219 Gastro-esophageal reflux disease without esophagitis: Secondary | ICD-10-CM | POA: Diagnosis not present

## 2023-03-25 ENCOUNTER — Encounter: Payer: Self-pay | Admitting: Family Medicine

## 2023-03-25 ENCOUNTER — Ambulatory Visit (INDEPENDENT_AMBULATORY_CARE_PROVIDER_SITE_OTHER): Payer: Medicare HMO | Admitting: Family Medicine

## 2023-03-25 VITALS — BP 122/84 | HR 88 | Temp 98.1°F | Ht 62.25 in | Wt 118.0 lb

## 2023-03-25 DIAGNOSIS — R300 Dysuria: Secondary | ICD-10-CM | POA: Diagnosis not present

## 2023-03-25 LAB — POC URINALSYSI DIPSTICK (AUTOMATED)
Bilirubin, UA: NEGATIVE
Blood, UA: 200
Glucose, UA: NEGATIVE
Ketones, UA: NEGATIVE
Nitrite, UA: NEGATIVE
Protein, UA: NEGATIVE
Spec Grav, UA: 1.01 (ref 1.010–1.025)
Urobilinogen, UA: 0.2 E.U./dL
pH, UA: 6 (ref 5.0–8.0)

## 2023-03-25 MED ORDER — SULFAMETHOXAZOLE-TRIMETHOPRIM 800-160 MG PO TABS: 1.0000 | ORAL_TABLET | Freq: Two times a day (BID) | ORAL | 0 refills | Status: DC

## 2023-03-25 NOTE — Progress Notes (Signed)
Patient ID: [name], female    DOB: [birth year], 82 y.o.   MRN: [id number]  This visit was conducted in person.  BP 122/84 (BP Location: Left Arm, Patient Position: Sitting, Cuff Size: Normal)   Pulse 88   Temp 98.1 F (36.7 C) (Temporal)   Ht 5' 2.25" (1.581 m)   Wt 118 lb (53.5 kg)   SpO2 98%   BMI 21.41 kg/m    CC:  Chief Complaint  Patient presents with   burning with urination    And not being able to void well. Patient states this started yesterday with urine gushing out.     Subjective:   HPI: [name] is a 82 y.o. female presenting on 03/25/2023 for burning with urination (And not being able to void well. Patient states this started yesterday with urine gushing out. )  Dysuria  This is a new problem. The current episode started yesterday. The problem has been gradually worsening. The quality of the pain is described as burning. The pain is at a severity of 8/10. The pain is moderate. There has been no fever. She is Not sexually active. There is No history of pyelonephritis. Associated symptoms include frequency, hesitancy, nausea and urgency. Pertinent negatives include no chills, discharge, flank pain, hematuria or vomiting. She has tried increased fluids (cranberry) for the symptoms. The treatment provided mild relief. There is no history of catheterization, kidney stones, recurrent UTIs, a single kidney, urinary stasis or a urological procedure.    Hx of UTI Ecoli last in 2020 some resistant.      Relevant past medical, surgical, family and social history reviewed and updated as indicated. Interim medical history since our last visit reviewed. Allergies and medications reviewed and updated. Outpatient Medications Prior to Visit  Medication Sig Dispense Refill   amLODipine (NORVASC) 5 MG tablet Take 1 tablet (5 mg total) by mouth daily. 90 tablet 1   aspirin EC 81 MG EC tablet Take 1 tablet (81 mg total) by mouth daily. Swallow whole. 30 tablet 11    baclofen (LIORESAL) 10 MG tablet Take 10 mg by mouth daily as needed. For headache     carbamide peroxide (DEBROX) 6.5 % OTIC solution Place 5 drops into the right ear daily as needed (for wax build up).     clorazepate (TRANXENE) 7.5 MG tablet Take 1 tablet (7.5 mg total) by mouth 2 (two) times daily as needed for anxiety. 20 tablet 0   Evolocumab (REPATHA SURECLICK) 140 MG/ML SOAJ Inject 140 mg into the skin every 14 (fourteen) days. 6 mL 3   losartan (COZAAR) 25 MG tablet Take 1 tablet (25 mg total) by mouth daily. 90 tablet 3   Multiple Vitamin (MULTIVITAMIN) tablet Take 1 tablet by mouth daily.     Potassium Gluconate 550 MG TABS Take 1,100 mg by mouth daily.     Probiotic Product (PROBIOTIC PO) Take 1 capsule by mouth daily.     triamcinolone ointment (KENALOG) 0.1 % Apply three times weekly, increasing to daily for flares as needed 30 g 4   vitamin B-12 (CYANOCOBALAMIN) 1000 MCG tablet Take 1,000 mcg by mouth daily.     zonisamide (ZONEGRAN) 100 MG capsule Take 100 mg by mouth daily.     metoprolol tartrate (LOPRESSOR) 25 MG tablet Take 1 tablet (25 mg total) by mouth 2 (two) times daily. 180 tablet 1   No facility-administered medications prior to visit.     Per HPI unless specifically indicated in ROS  section below Review of Systems  Constitutional:  Negative for chills, fatigue and fever.  HENT:  Negative for ear pain.   Eyes:  Negative for pain.  Respiratory:  Negative for chest tightness and shortness of breath.   Cardiovascular:  Negative for chest pain, palpitations and leg swelling.  Gastrointestinal:  Positive for nausea. Negative for abdominal pain and vomiting.  Genitourinary:  Positive for dysuria, frequency, hesitancy and urgency. Negative for flank pain and hematuria.   Objective:  BP 122/84 (BP Location: Left Arm, Patient Position: Sitting, Cuff Size: Normal)   Pulse 88   Temp 98.1 F (36.7 C) (Temporal)   Ht 5' 2.25" (1.581 m)   Wt 118 lb (53.5 kg)   SpO2 98%    BMI 21.41 kg/m   Wt Readings from Last 3 Encounters:  03/25/23 118 lb (53.5 kg)  02/10/23 117 lb (53.1 kg)  10/24/22 122 lb (55.3 kg)      Physical Exam Constitutional:      General: She is not in acute distress.    Appearance: Normal appearance. She is well-developed. She is not ill-appearing or toxic-appearing.  HENT:     Head: Normocephalic.     Right Ear: Hearing, tympanic membrane, ear canal and external ear normal. Tympanic membrane is not erythematous, retracted or bulging.     Left Ear: Hearing, tympanic membrane, ear canal and external ear normal. Tympanic membrane is not erythematous, retracted or bulging.     Nose: No mucosal edema or rhinorrhea.     Right Sinus: No maxillary sinus tenderness or frontal sinus tenderness.     Left Sinus: No maxillary sinus tenderness or frontal sinus tenderness.     Mouth/Throat:     Pharynx: Uvula midline.  Eyes:     General: Lids are normal. Lids are everted, no foreign bodies appreciated.     Conjunctiva/sclera: Conjunctivae normal.     Pupils: Pupils are equal, round, and reactive to light.  Neck:     Thyroid: No thyroid mass or thyromegaly.     Vascular: No carotid bruit.     Trachea: Trachea normal.  Cardiovascular:     Rate and Rhythm: Normal rate and regular rhythm.     Pulses: Normal pulses.     Heart sounds: Normal heart sounds, S1 normal and S2 normal. No murmur heard.    No friction rub. No gallop.  Pulmonary:     Effort: Pulmonary effort is normal. No tachypnea or respiratory distress.     Breath sounds: Normal breath sounds. No decreased breath sounds, wheezing, rhonchi or rales.  Abdominal:     General: Bowel sounds are normal.     Palpations: Abdomen is soft.     Tenderness: There is abdominal tenderness in the suprapubic area. There is no right CVA tenderness or left CVA tenderness.  Musculoskeletal:     Cervical back: Normal range of motion and neck supple.  Skin:    General: Skin is warm and dry.      Findings: No rash.  Neurological:     Mental Status: She is alert.  Psychiatric:        Mood and Affect: Mood is not anxious or depressed.        Speech: Speech normal.        Behavior: Behavior normal. Behavior is cooperative.        Thought Content: Thought content normal.        Judgment: Judgment normal.       Results for orders placed or  performed in visit on 03/25/23  POCT Urinalysis Dipstick (Automated)  Result Value Ref Range   Color, UA light yellow    Clarity, UA clear    Glucose, UA Negative Negative   Bilirubin, UA neg    Ketones, UA neg    Spec Grav, UA 1.010 1.010 - 1.025   Blood, UA 200 ery/uL    pH, UA 6.0 5.0 - 8.0   Protein, UA Negative Negative   Urobilinogen, UA 0.2 0.2 or 1.0 E.U./dL   Nitrite, UA neg    Leukocytes, UA Large (3+) (A) Negative    Assessment and Plan  PCN allergy Dysuria Assessment & Plan:  Acute, very typical of UTI. UA positive.  Treat with Bactrim DS BID x 3 days. Send urine culture for identification and sensitivities. Push fluids, rest.  Return and ER precautions provided.  Orders: -     POCT Urinalysis Dipstick (Automated) -     Urine Culture  Other orders -     Sulfamethoxazole-Trimethoprim; Take 1 tablet by mouth 2 (two) times daily.  Dispense: 6 tablet; Refill: 0    No follow-ups on file.   Kerby Nora, MD

## 2023-03-25 NOTE — Assessment & Plan Note (Signed)
Acute, very typical of UTI. UA positive.  Treat with Bactrim DS BID x 3 days. Send urine culture for identification and sensitivities. Push fluids, rest.  Return and ER precautions provided.

## 2023-03-26 ENCOUNTER — Ambulatory Visit: Payer: Medicare HMO | Admitting: Internal Medicine

## 2023-03-27 LAB — URINE CULTURE
MICRO NUMBER:: 14837353
SPECIMEN QUALITY:: ADEQUATE

## 2023-04-02 ENCOUNTER — Encounter: Payer: Self-pay | Admitting: Family Medicine

## 2023-04-03 ENCOUNTER — Other Ambulatory Visit: Payer: Self-pay | Admitting: Family Medicine

## 2023-04-03 MED ORDER — SULFAMETHOXAZOLE-TRIMETHOPRIM 800-160 MG PO TABS: 1.0000 | ORAL_TABLET | Freq: Two times a day (BID) | ORAL | 0 refills | Status: DC

## 2023-06-16 DIAGNOSIS — I872 Venous insufficiency (chronic) (peripheral): Secondary | ICD-10-CM | POA: Diagnosis not present

## 2023-06-16 DIAGNOSIS — Z8673 Personal history of transient ischemic attack (TIA), and cerebral infarction without residual deficits: Secondary | ICD-10-CM | POA: Diagnosis not present

## 2023-06-16 DIAGNOSIS — I890 Lymphedema, not elsewhere classified: Secondary | ICD-10-CM | POA: Diagnosis not present

## 2023-06-17 ENCOUNTER — Encounter: Payer: Self-pay | Admitting: Internal Medicine

## 2023-06-17 DIAGNOSIS — I890 Lymphedema, not elsewhere classified: Secondary | ICD-10-CM | POA: Diagnosis not present

## 2023-06-17 MED ORDER — FUROSEMIDE 20 MG PO TABS: 20.0000 mg | ORAL_TABLET | Freq: Every day | ORAL | 0 refills | Status: DC | PRN

## 2023-06-25 ENCOUNTER — Ambulatory Visit: Payer: Medicare HMO | Admitting: Dermatology

## 2023-06-25 VITALS — BP 105/68 | HR 81

## 2023-06-25 DIAGNOSIS — L900 Lichen sclerosus et atrophicus: Secondary | ICD-10-CM | POA: Diagnosis not present

## 2023-06-25 DIAGNOSIS — Z7189 Other specified counseling: Secondary | ICD-10-CM

## 2023-06-25 DIAGNOSIS — I872 Venous insufficiency (chronic) (peripheral): Secondary | ICD-10-CM

## 2023-06-25 MED ORDER — MOMETASONE FUROATE 0.1 % EX CREA: TOPICAL_CREAM | CUTANEOUS | 1 refills | Status: DC

## 2023-06-25 MED ORDER — TRIAMCINOLONE ACETONIDE 0.1 % EX OINT: TOPICAL_OINTMENT | CUTANEOUS | 4 refills | Status: DC

## 2023-06-25 NOTE — Patient Instructions (Signed)

## 2023-06-25 NOTE — Progress Notes (Signed)
Follow-Up Visit   Subjective  [name] is a 82 y.o. 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 Check itchy rash on the lower legs, patient recently started wearing compression hose due to increased leg swelling.   The following portions of the chart were reviewed this encounter and updated as appropriate: medications, allergies, medical history  Review of Systems:  No other skin or systemic complaints except as noted in HPI or Assessment and Plan.  Objective  Well appearing patient in no apparent distress; mood and affect are within normal limits.  A focused examination was performed of the following areas:vaginal area, bilateral legs     Relevant exam findings are noted in the Assessment and Plan.    Assessment & Plan   STASIS DERMATITIS Exam: Erythematous patches involving the ankle and distal lower leg with associated lower leg edema. 2++ pitting edema    Chronic and persistent condition with duration or expected duration over one year. Condition is bothersome/symptomatic for patient. Currently flared.   Stasis in the legs causes chronic leg swelling, which may result in itchy or painful rashes, skin discoloration, skin texture changes, and sometimes ulceration.  Recommend daily graduated compression hose/stockings- easiest to put on first thing in morning, remove at bedtime.  Elevate legs as much as possible. Avoid salt/sodium rich foods. Recommend consultation with PCP to discuss   Treatment Plan:  Start Mometasone cream apply to affected area on legs qd-bid prn itchy rash for up to 2-4 weeks.  Caution skin atrophy with long-term use.  Continue daily compression hose Recommend consultation with PCP to discuss edema and possible adding a diuretic.  Topical steroids (such as triamcinolone, fluocinolone, fluocinonide, mometasone, clobetasol, halobetasol,  betamethasone, hydrocortisone) can cause thinning and lightening of the skin if they are used for too long in the same area. Your physician has selected the right strength medicine for your problem and area affected on the body. Please use your medication only as directed by your physician to prevent side effects.     Lichen sclerosus et atrophicus vaginal area Mild erythema at inferior introitus and at labia majora. Hypopigmentation at the labial minora, mild scarring at clitoral hood    Chronic condition with duration or expected duration over one year. Currently well-controlled. Pt states minimal symptoms using TMC 0.1% ointment 3x/wk   Lichen sclerosus is a chronic inflammatory condition of unknown cause that frequently involves the vaginal area and less commonly extragenital skin, and is NOT sexually transmitted. It frequently causes symptoms of pain and burning.  It requires regular monitoring and treatment with topical steroids to minimize inflammation and to reduce risk of scarring. There is also a risk of cancer in the vaginal area which is very low if inflammation is well controlled. Regular checks of the area are recommended. Please call if you notice any new or changing spots within this area.   Continue Triamcinolone 0.1% ointment 3 times per week, increasing to daily as needed for flares.    Topical steroids (such as triamcinolone, fluocinolone, fluocinonide, mometasone, clobetasol, halobetasol, betamethasone, hydrocortisone) can cause thinning and lightening of the skin if they are used for too long in the same area. Your physician has selected the right strength medicine for your problem and area affected on the body. Please use your medication only as directed by your physician to prevent side effects.     Lichen sclerosus et  atrophicus  Related Medications triamcinolone ointment (KENALOG) 0.1 % Apply three times weekly, increasing to daily for flares as needed   Return in  about 6 months (around 12/26/2023) for Lichen sclerosus .  I, Angelique Holm, CMA, am acting as scribe for Willeen Niece, MD .   Documentation: I have reviewed the above documentation for accuracy and completeness, and I agree with the above.  Willeen Niece, MD

## 2023-06-26 ENCOUNTER — Ambulatory Visit (INDEPENDENT_AMBULATORY_CARE_PROVIDER_SITE_OTHER): Payer: Medicare HMO | Admitting: Internal Medicine

## 2023-06-26 ENCOUNTER — Encounter: Payer: Self-pay | Admitting: Internal Medicine

## 2023-06-26 VITALS — BP 122/70 | HR 92 | Temp 97.9°F | Ht 62.25 in | Wt 118.0 lb

## 2023-06-26 DIAGNOSIS — I872 Venous insufficiency (chronic) (peripheral): Secondary | ICD-10-CM | POA: Diagnosis not present

## 2023-06-26 MED ORDER — FUROSEMIDE 40 MG PO TABS: 40.0000 mg | ORAL_TABLET | Freq: Every day | ORAL | 3 refills | Status: DC

## 2023-06-26 NOTE — Patient Instructions (Signed)
Please change to salt substitute--don't use regular salt Wear the support hose every day Take the 40mg  furosemide every morning unless your legs have no swelling that morning You can try an over the counter magnesium tab at bedtime if you have more leg cramps.

## 2023-06-26 NOTE — Progress Notes (Signed)
Subjective:    Patient ID: [name], female    DOB: 17-Nov-1941, 82 y.o.   MRN: [id number]  HPI Here due to worsening leg swelling and redness With daughter  Right leg has had some swelling for years Didn't really bother her Worsening swelling and some in left over the past month Feet were tingling and burning  Has tried the furosemide ---a few doses (limiting as I requested) Has been wearing support hose Got steroid cream for the rash from Dr Lisbeth Renshaw didn't start  Current Outpatient Medications on File Prior to Visit  Medication Sig Dispense Refill   amLODipine (NORVASC) 5 MG tablet Take 1 tablet (5 mg total) by mouth daily. 90 tablet 1   aspirin EC 81 MG EC tablet Take 1 tablet (81 mg total) by mouth daily. Swallow whole. 30 tablet 11   baclofen (LIORESAL) 10 MG tablet Take 10 mg by mouth daily as needed. For headache     carbamide peroxide (DEBROX) 6.5 % OTIC solution Place 5 drops into the right ear daily as needed (for wax build up).     clorazepate (TRANXENE) 7.5 MG tablet Take 1 tablet (7.5 mg total) by mouth 2 (two) times daily as needed for anxiety. 20 tablet 0   Evolocumab (REPATHA SURECLICK) 140 MG/ML SOAJ Inject 140 mg into the skin every 14 (fourteen) days. 6 mL 3   furosemide (LASIX) 20 MG tablet Take 1 tablet (20 mg total) by mouth daily as needed. 30 tablet 0   losartan (COZAAR) 25 MG tablet Take 1 tablet (25 mg total) by mouth daily. 90 tablet 3   mometasone (ELOCON) 0.1 % cream Apply to affected areas on legs  twice a day prn flares for 2 weeks, no longer than 4 weeks, 45 g 1   Multiple Vitamin (MULTIVITAMIN) tablet Take 1 tablet by mouth daily.     Potassium Gluconate 550 MG TABS Take 1,100 mg by mouth daily.     Probiotic Product (PROBIOTIC PO) Take 1 capsule by mouth daily.     sulfamethoxazole-trimethoprim (BACTRIM DS) 800-160 MG tablet Take 1 tablet by mouth 2 (two) times daily. 6 tablet 0   triamcinolone ointment (KENALOG) 0.1 % Apply three times  weekly, increasing to daily for flares as needed 30 g 4   vitamin B-12 (CYANOCOBALAMIN) 1000 MCG tablet Take 1,000 mcg by mouth daily.     zonisamide (ZONEGRAN) 100 MG capsule Take 100 mg by mouth daily.     No current facility-administered medications on file prior to visit.    Allergies  Allergen Reactions   Clonazepam Swelling and Other (See Comments)    REACTION: Swollen lips, sore mouth   Penicillins Other (See Comments)    Unrecalled reaction Has patient had a PCN reaction causing immediate rash, facial/tongue/throat swelling, SOB or lightheadedness with hypotension: Unknown Has patient had a PCN reaction causing severe rash involving mucus membranes or skin necrosis: Unknown Has patient had a PCN reaction that required hospitalization: Unknown Has patient had a PCN reaction occurring within the last 10 years: No If all of the above answers are "NO", then may proceed with Cephalosporin use.    Statins Other (See Comments)    Severe muscle pain    Past Medical History:  Diagnosis Date   Anxiety    Aortic atherosclerosis (HCC)    GERD (gastroesophageal reflux disease)    Dr. 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 Riley Kill   [facility] (hard of hearing)    uses hearing aid on left side/complete loss of hearing right side   IBS (irritable bowel syndrome)    LBBB (left bundle branch block)    Migraine    OA (osteoarthritis)    Osteopenia 2005   Ulcer 2003   bleeding stomach ulcer    Past Surgical History:  Procedure Laterality Date   ABDOMINAL HYSTERECTOMY  1983   APPENDECTOMY  1983   BOWEL RESECTION N/A 01/20/2019   Procedure: DIAGNOSTIC LAP WITH SMALL BOWEL RESECTION;  Surgeon: Rodman Pickle, MD;  Location: [facility];  Service: General;  Laterality: N/A;   CARDIAC CATHETERIZATION  1997   okay, mild changes   CHOLECYSTECTOMY  1992    COLONOSCOPY  2003   DEXA N/A 2005   borderline osteopenia   DIAGNOSTIC LAPAROSCOPY  11/25/2018   LAPAROSCOPY N/A 11/25/2018   Procedure: LAPAROSCOPY DIAGNOSTIC;  Surgeon: Rodman Pickle, MD;  Location: [facility];  Service: General;  Laterality: N/A;   TONSILLECTOMY AND ADENOIDECTOMY  1953   UPPER GASTROINTESTINAL ENDOSCOPY  2003    Family History  Problem Relation Age of Onset   Heart attack Father 86   Diabetes Father    Coronary artery disease Father    COPD Mother    Hypertension Mother    Hypertension Sister    Pulmonary fibrosis Sister    Cancer Daughter        breast   Rheum arthritis Daughter    Colon cancer Neg Hx    Esophageal cancer Neg Hx    Rectal cancer Neg Hx     Social History   Socioeconomic History   Marital status: Married    Spouse name: Windy Fast   Number of children: 2   Years of education: Not on file   Highest education level: Not on file  Occupational History   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  Tobacco Use   Smoking status: Never    Passive exposure: Past   Smokeless tobacco: Never  Vaping Use   Vaping status: Never Used  Substance and Sexual Activity   Alcohol use: No   Drug use: No   Sexual activity: Not on file  Other Topics Concern   Not on file  Social History Narrative   Has living will   Husband, then daughters, are [place] health care POA   Would accept resuscitation attempts but no prolonged artifical ventilation   No prolonged tube feeds if cognitively impaired   Social Determinants of Health   Financial Resource Strain: Not on file  Food Insecurity: Not on file  Transportation Needs: Not on file  Physical Activity: Not on file  Stress: Not on file  Social Connections: Not on file  Intimate Partner Violence: Not on file   Review of Systems No SOB Still has swelling in the morning Still using salt with cooking    Objective:   Physical Exam Constitutional:      Appearance: Normal appearance.   Cardiovascular:     Rate and Rhythm: Normal rate and regular rhythm.     Heart sounds: No murmur heard.    No gallop.  Pulmonary:     Effort: Pulmonary effort is normal.     Breath sounds: Normal breath sounds. No wheezing or rales.  Musculoskeletal:     Cervical back: Neck supple.  Comments: 1-2+ edema in both calves and feet Mild calf tenderness  Lymphadenopathy:     Cervical: No cervical adenopathy.  Skin:    Comments: Mild lacy redness in both anterior calves  Neurological:     Mental Status: She is alert.            Assessment & Plan:

## 2023-06-26 NOTE — Assessment & Plan Note (Signed)
Worsening lately Discussed changing to salt substitute Continue daily support hose Increase furosemide to 40mg  daily if any AM edema Will recheck in 1 month---refer to vascular then if not improved

## 2023-06-30 ENCOUNTER — Ambulatory Visit: Payer: Medicare HMO | Admitting: Dermatology

## 2023-07-08 ENCOUNTER — Encounter (INDEPENDENT_AMBULATORY_CARE_PROVIDER_SITE_OTHER): Payer: Self-pay

## 2023-07-27 ENCOUNTER — Encounter: Payer: Self-pay | Admitting: Internal Medicine

## 2023-07-27 ENCOUNTER — Ambulatory Visit: Payer: Medicare HMO | Admitting: Internal Medicine

## 2023-07-27 VITALS — BP 114/70 | HR 76 | Temp 97.8°F | Ht 62.25 in | Wt 116.0 lb

## 2023-07-27 DIAGNOSIS — I872 Venous insufficiency (chronic) (peripheral): Secondary | ICD-10-CM | POA: Diagnosis not present

## 2023-07-27 LAB — RENAL FUNCTION PANEL
Albumin: 4 g/dL (ref 3.5–5.2)
BUN: 10 mg/dL (ref 6–23)
CO2: 24 mEq/L (ref 19–32)
Calcium: 9 mg/dL (ref 8.4–10.5)
Chloride: 107 mEq/L (ref 96–112)
Creatinine, Ser: 0.86 mg/dL (ref 0.40–1.20)
GFR: 63.39 mL/min (ref >60.00)
Glucose, Bld: 101 mg/dL — ABNORMAL HIGH (ref 70–99)
Phosphorus: 2.9 mg/dL (ref 2.3–4.6)
Potassium: 4 mEq/L (ref 3.5–5.1)
Sodium: 139 mEq/L (ref 135–145)

## 2023-07-27 NOTE — Assessment & Plan Note (Signed)
Much better with the support hose Daily furosemide--can use as needed only (??every other day) Vastly improved and no skin/pain issues---will hold off on vascular eveluation

## 2023-07-27 NOTE — Progress Notes (Signed)
Subjective:    Patient ID: [name], female    DOB: 27-Jan-1941, 82 y.o.   MRN: [id number]  HPI Here for follow up of edema--venous  Swelling down some--but not gone Support hose do help Feels better with the hose--no pain now (usually wears 12 hours) Redness may be some better  Switched to salt substitute (at least at home) Trying to avoid salt mostly  Using the furosemide every day---because there is persistent swelling  Current Outpatient Medications on File Prior to Visit  Medication Sig Dispense Refill   amLODipine (NORVASC) 5 MG tablet Take 1 tablet (5 mg total) by mouth daily. 90 tablet 1   aspirin EC 81 MG EC tablet Take 1 tablet (81 mg total) by mouth daily. Swallow whole. 30 tablet 11   baclofen (LIORESAL) 10 MG tablet Take 10 mg by mouth daily as needed. For headache     carbamide peroxide (DEBROX) 6.5 % OTIC solution Place 5 drops into the right ear daily as needed (for wax build up).     clorazepate (TRANXENE) 7.5 MG tablet Take 1 tablet (7.5 mg total) by mouth 2 (two) times daily as needed for anxiety. 20 tablet 0   Evolocumab (REPATHA SURECLICK) 140 MG/ML SOAJ Inject 140 mg into the skin every 14 (fourteen) days. 6 mL 3   furosemide (LASIX) 40 MG tablet Take 1 tablet (40 mg total) by mouth daily. Hold if no AM leg swelling 30 tablet 3   losartan (COZAAR) 25 MG tablet Take 1 tablet (25 mg total) by mouth daily. 90 tablet 3   Multiple Vitamin (MULTIVITAMIN) tablet Take 1 tablet by mouth daily.     Potassium Gluconate 550 MG TABS Take 1,100 mg by mouth daily.     Probiotic Product (PROBIOTIC PO) Take 1 capsule by mouth daily.     triamcinolone ointment (KENALOG) 0.1 % Apply three times weekly, increasing to daily for flares as needed 30 g 4   vitamin B-12 (CYANOCOBALAMIN) 1000 MCG tablet Take 1,000 mcg by mouth daily.     zonisamide (ZONEGRAN) 100 MG capsule Take 100 mg by mouth daily.     No current facility-administered medications on file prior to visit.     Allergies  Allergen Reactions   Clonazepam Swelling and Other (See Comments)    REACTION: Swollen lips, sore mouth   Penicillins Other (See Comments)    Unrecalled reaction Has patient had a PCN reaction causing immediate rash, facial/tongue/throat swelling, SOB or lightheadedness with hypotension: Unknown Has patient had a PCN reaction causing severe rash involving mucus membranes or skin necrosis: Unknown Has patient had a PCN reaction that required hospitalization: Unknown Has patient had a PCN reaction occurring within the last 10 years: No If all of the above answers are "NO", then may proceed with Cephalosporin use.    Statins Other (See Comments)    Severe muscle pain    Past Medical History:  Diagnosis Date   Anxiety    Aortic atherosclerosis (HCC)    GERD (gastroesophageal reflux disease)    Dr. Juanda Chance   H/O abnormal weight loss    lost over 20 lbs in the last year   Hard of hearing    deaf in Right ear   History of Bell's palsy 02/2011   HLD (hyperlipidemia)    Dr. Vira Blanco (hard of hearing)    uses hearing aid on left side/complete loss of hearing right side   IBS (irritable bowel syndrome)    LBBB (left  bundle branch block)    Migraine    OA (osteoarthritis)    Osteopenia 2005   Ulcer 2003   bleeding stomach ulcer    Past Surgical History:  Procedure Laterality Date   ABDOMINAL HYSTERECTOMY  1983   APPENDECTOMY  1983   BOWEL RESECTION N/A 01/20/2019   Procedure: DIAGNOSTIC LAP WITH SMALL BOWEL RESECTION;  Surgeon: Sheliah Hatch, De Blanch, MD;  Location: [facility];  Service: General;  Laterality: N/A;   CARDIAC CATHETERIZATION  1997   okay, mild changes   CHOLECYSTECTOMY  1992   COLONOSCOPY  2003   DEXA N/A 2005   borderline osteopenia   DIAGNOSTIC LAPAROSCOPY  11/25/2018   LAPAROSCOPY N/A 11/25/2018   Procedure: LAPAROSCOPY DIAGNOSTIC;  Surgeon: Rodman Pickle, MD;  Location: [facility];  Service: General;  Laterality: N/A;   TONSILLECTOMY  AND ADENOIDECTOMY  1953   UPPER GASTROINTESTINAL ENDOSCOPY  2003    Family History  Problem Relation Age of Onset   Heart attack Father 20   Diabetes Father    Coronary artery disease Father    COPD Mother    Hypertension Mother    Hypertension Sister    Pulmonary fibrosis Sister    Cancer Daughter        breast   Rheum arthritis Daughter    Colon cancer Neg Hx    Esophageal cancer Neg Hx    Rectal cancer Neg Hx     Social History   Socioeconomic History   Marital status: Married    Spouse name: Windy Fast   Number of children: 2   Years of education: Not on file   Highest education level: Not on file  Occupational History   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  Tobacco Use   Smoking status: Never    Passive exposure: Past   Smokeless tobacco: Never  Vaping Use   Vaping status: Never Used  Substance and Sexual Activity   Alcohol use: No   Drug use: No   Sexual activity: Not on file  Other Topics Concern   Not on file  Social History Narrative   Has living will   Husband, then daughters, are [place] health care POA   Would accept resuscitation attempts but no prolonged artifical ventilation   No prolonged tube feeds if cognitively impaired   Social Determinants of Health   Financial Resource Strain: Not on file  Food Insecurity: Not on file  Transportation Needs: Not on file  Physical Activity: Not on file  Stress: Not on file  Social Connections: Not on file  Intimate Partner Violence: Not on file   Review of Systems Weighs daily--has been stable No SOB    Objective:   Physical Exam Constitutional:      Appearance: Normal appearance.  Musculoskeletal:     Comments: Slight edema in right calf--but not really in left No tenderness  Skin:    Comments: No sig stasis changes now  Neurological:     Mental Status: She is alert.            Assessment & Plan:

## 2023-07-31 ENCOUNTER — Ambulatory Visit (INDEPENDENT_AMBULATORY_CARE_PROVIDER_SITE_OTHER): Payer: Medicare HMO | Admitting: Family Medicine

## 2023-07-31 VITALS — BP 106/60 | HR 73 | Temp 97.7°F | Ht 62.25 in | Wt 115.2 lb

## 2023-07-31 DIAGNOSIS — N3943 Post-void dribbling: Secondary | ICD-10-CM

## 2023-07-31 DIAGNOSIS — N8110 Cystocele, unspecified: Secondary | ICD-10-CM

## 2023-07-31 LAB — POC URINALSYSI DIPSTICK (AUTOMATED)
Bilirubin, UA: NEGATIVE
Blood, UA: NEGATIVE
Glucose, UA: NEGATIVE
Ketones, UA: NEGATIVE
Leukocytes, UA: NEGATIVE
Nitrite, UA: NEGATIVE
Protein, UA: NEGATIVE
Spec Grav, UA: <=1.005 — AB (ref 1.010–1.025)
Urobilinogen, UA: 0.2 E.U./dL
pH, UA: 7 (ref 5.0–8.0)

## 2023-07-31 NOTE — Assessment & Plan Note (Signed)
Acute, discussed symptoms.  She states that she is bothered enough that she does not want to do temporizing measures.  She also has disruption of urine flow and increased risk of urinary tract infection.  Urinalysis was clear in the office today. Will place referral to gynecology for more definitive treatment such as pessary. Will have her applying vaginal lubrication to help with irritation.

## 2023-07-31 NOTE — Progress Notes (Signed)
Patient ID: [name], female    DOB: 1941-03-07, 81 y.o.   MRN: [id number]  This visit was conducted in person.  BP 106/60   Pulse 73   Temp 97.7 F (36.5 C) (Temporal)   Ht 5' 2.25" (1.581 m)   Wt 115 lb 3.2 oz (52.3 kg)   SpO2 99%   BMI 20.90 kg/m    CC:  Chief Complaint  Patient presents with   Vaginal Prolapse    Patient reports that she feels pressure like "something is going to fall out."  Started 07/25/23.  Also having trouble urinating.     Subjective:   HPI: [name] is a 82 y.o. female presenting on 07/31/2023 for Vaginal Prolapse (Patient reports that she feels pressure like "something is going to fall out."  Started 07/25/23.  Also having trouble urinating. )   She reports new onset vaginal pressure in  last week.  Low abdominal pressure.  Has noted some issue urinating.. dribbling, intermittent stream in last few days. Felt and saw mass in vagina. White grey blob... push the tissue back inside her. 8/24  Now  feel intermittent pressure.   No burning with urination, no blood in urine.  No vaginal  discharge. No fever.  She is having  daily.nml BMs for her   Recent trip to beach.   History of  total/partial ? hysterectomy .Marland Kitchen For non cancer cause.     Relevant past medical, surgical, family and social history reviewed and updated as indicated. Interim medical history since our last visit reviewed. Allergies and medications reviewed and updated. Outpatient Medications Prior to Visit  Medication Sig Dispense Refill   amLODipine (NORVASC) 5 MG tablet Take 1 tablet (5 mg total) by mouth daily. 90 tablet 1   aspirin EC 81 MG EC tablet Take 1 tablet (81 mg total) by mouth daily. Swallow whole. 30 tablet 11   baclofen (LIORESAL) 10 MG tablet Take 10 mg by mouth daily as needed. For headache     carbamide peroxide (DEBROX) 6.5 % OTIC solution Place 5 drops into the right ear daily as needed (for wax build up).     clorazepate (TRANXENE) 7.5 MG tablet  Take 1 tablet (7.5 mg total) by mouth 2 (two) times daily as needed for anxiety. 20 tablet 0   Evolocumab (REPATHA SURECLICK) 140 MG/ML SOAJ Inject 140 mg into the skin every 14 (fourteen) days. 6 mL 3   furosemide (LASIX) 40 MG tablet Take 1 tablet (40 mg total) by mouth daily. Hold if no AM leg swelling 30 tablet 3   losartan (COZAAR) 25 MG tablet Take 1 tablet (25 mg total) by mouth daily. 90 tablet 3   mometasone (ELOCON) 0.1 % cream Apply topically 2 (two) times daily as needed.     Multiple Vitamin (MULTIVITAMIN) tablet Take 1 tablet by mouth daily.     Potassium Gluconate 550 MG TABS Take 1,100 mg by mouth daily.     Probiotic Product (PROBIOTIC PO) Take 1 capsule by mouth daily.     triamcinolone ointment (KENALOG) 0.1 % Apply three times weekly, increasing to daily for flares as needed 30 g 4   vitamin B-12 (CYANOCOBALAMIN) 1000 MCG tablet Take 1,000 mcg by mouth daily.     zonisamide (ZONEGRAN) 100 MG capsule Take 100 mg by mouth daily.     No facility-administered medications prior to visit.     Per HPI unless specifically indicated in ROS section below Review of Systems  Constitutional:  Negative for fatigue and fever.  HENT:  Negative for congestion.   Eyes:  Negative for pain.  Respiratory:  Negative for cough and shortness of breath.   Cardiovascular:  Negative for chest pain, palpitations and leg swelling.  Gastrointestinal:  Negative for abdominal pain.  Genitourinary:  Negative for dysuria and vaginal bleeding.  Musculoskeletal:  Negative for back pain.  Neurological:  Negative for syncope, light-headedness and headaches.  Psychiatric/Behavioral:  Negative for dysphoric mood.    Objective:  BP 106/60   Pulse 73   Temp 97.7 F (36.5 C) (Temporal)   Ht 5' 2.25" (1.581 m)   Wt 115 lb 3.2 oz (52.3 kg)   SpO2 99%   BMI 20.90 kg/m   Wt Readings from Last 3 Encounters:  07/31/23 115 lb 3.2 oz (52.3 kg)  07/27/23 116 lb (52.6 kg)  06/26/23 118 lb (53.5 kg)       Physical Exam Exam conducted with a chaperone present.  Constitutional:      General: She is not in acute distress.    Appearance: Normal appearance. She is well-developed. She is not ill-appearing or toxic-appearing.  HENT:     Head: Normocephalic.     Right Ear: Hearing, tympanic membrane, ear canal and external ear normal. Tympanic membrane is not erythematous, retracted or bulging.     Left Ear: Hearing, tympanic membrane, ear canal and external ear normal. Tympanic membrane is not erythematous, retracted or bulging.     Nose: No mucosal edema or rhinorrhea.     Right Sinus: No maxillary sinus tenderness or frontal sinus tenderness.     Left Sinus: No maxillary sinus tenderness or frontal sinus tenderness.     Mouth/Throat:     Mouth: Oropharynx is clear and moist and mucous membranes are normal.     Pharynx: Uvula midline.  Eyes:     General: Lids are normal. Lids are everted, no foreign bodies appreciated.     Extraocular Movements: EOM normal.     Conjunctiva/sclera: Conjunctivae normal.     Pupils: Pupils are equal, round, and reactive to light.  Neck:     Thyroid: No thyroid mass or thyromegaly.     Vascular: No carotid bruit.     Trachea: Trachea normal.  Cardiovascular:     Rate and Rhythm: Normal rate and regular rhythm.     Pulses: Normal pulses.     Heart sounds: Normal heart sounds, S1 normal and S2 normal. No murmur heard.    No friction rub. No gallop.  Pulmonary:     Effort: Pulmonary effort is normal. No tachypnea or respiratory distress.     Breath sounds: Normal breath sounds. No decreased breath sounds, wheezing, rhonchi or rales.  Abdominal:     General: Bowel sounds are normal.     Palpations: Abdomen is soft.     Tenderness: There is no abdominal tenderness.     Hernia: There is no hernia in the left inguinal area or right inguinal area.  Genitourinary:    Exam position: Lithotomy position.     Pubic Area: No rash.      Labia:        Right: No rash.         Left: No rash.      Urethra: No prolapse.     Comments: Prolapse of vaginal tissue.. cystocele and rectocele grade 1 Musculoskeletal:     Cervical back: Normal range of motion and neck supple.  Lymphadenopathy:     Lower  Body: No right inguinal adenopathy. No left inguinal adenopathy.  Skin:    General: Skin is warm, dry and intact.     Findings: No rash.  Neurological:     Mental Status: She is alert.  Psychiatric:        Mood and Affect: Mood is not anxious or depressed.        Speech: Speech normal.        Behavior: Behavior normal. Behavior is cooperative.        Thought Content: Thought content normal.        Cognition and Memory: Cognition and memory normal.        Judgment: Judgment normal.       Results for orders placed or performed in visit on 07/31/23  POCT Urinalysis Dipstick (Automated)  Result Value Ref Range   Color, UA Yellow    Clarity, UA Clear    Glucose, UA Negative Negative   Bilirubin, UA Negative    Ketones, UA Negative    Spec Grav, UA <=1.005 (A) 1.010 - 1.025   Blood, UA Negative    pH, UA 7.0 5.0 - 8.0   Protein, UA Negative Negative   Urobilinogen, UA 0.2 0.2 or 1.0 E.U./dL   Nitrite, UA Negative    Leukocytes, UA Negative Negative    Assessment and Plan  Dribbling of urine -     POCT Urinalysis Dipstick (Automated) -     Ambulatory referral to Gynecology  Vaginal prolapse Assessment & Plan: Acute, discussed symptoms.  She states that she is bothered enough that she does not want to do temporizing measures.  She also has disruption of urine flow and increased risk of urinary tract infection.  Urinalysis was clear in the office today. Will place referral to gynecology for more definitive treatment such as pessary. Will have her applying vaginal lubrication to help with irritation.  Orders: -     Ambulatory referral to Gynecology    Return if symptoms worsen or fail to improve.   Kerby Nora, MD

## 2023-07-31 NOTE — Patient Instructions (Signed)
Apply topical vaginal lubricant to keep area moisturized.  If change in urine flow push vaginal prolapse anterior. Will place referral to gynecology for consideration of possible pessary or other treatment options.

## 2023-08-03 ENCOUNTER — Encounter: Payer: Medicare HMO | Admitting: Internal Medicine

## 2023-08-14 DIAGNOSIS — G43719 Chronic migraine without aura, intractable, without status migrainosus: Secondary | ICD-10-CM | POA: Diagnosis not present

## 2023-08-21 ENCOUNTER — Other Ambulatory Visit: Payer: Self-pay | Admitting: Internal Medicine

## 2023-08-24 ENCOUNTER — Ambulatory Visit: Payer: Medicare HMO | Admitting: Obstetrics & Gynecology

## 2023-08-24 ENCOUNTER — Encounter: Payer: Self-pay | Admitting: Obstetrics & Gynecology

## 2023-08-24 VITALS — BP 149/71 | HR 89 | Ht 62.0 in | Wt 116.0 lb

## 2023-08-24 DIAGNOSIS — N8110 Cystocele, unspecified: Secondary | ICD-10-CM

## 2023-08-24 DIAGNOSIS — L900 Lichen sclerosus et atrophicus: Secondary | ICD-10-CM | POA: Diagnosis not present

## 2023-08-24 DIAGNOSIS — Z4689 Encounter for fitting and adjustment of other specified devices: Secondary | ICD-10-CM | POA: Diagnosis not present

## 2023-08-24 NOTE — Progress Notes (Signed)
GYNECOLOGY PROGRESS NOTE  Subjective:    Patient ID: [name], female    DOB: 1941-09-02, 82 y.o.   MRN: [id number]  HPI  Patient is a 82 y.o. married G2P2002 female who presents for vaginal vault prolapse. She reports that this started about a month ago. It happens several times per day. She cannot think of any activity that makes it worse or better. She says that she and her husband have not attempted or had sex for several years. She saw her primary care provider about this issue last month and was referred here. She has had some difficulty emptying her bladder completely recently and has to use her fingers to help push in the vaginal area.  I used to take care of her for her lichen sclerosis in the distant past. She reports that she uses her steroid cream on occasion. She denies any vulvar discomfort at the present.  The following portions of the patient's history were reviewed and updated as appropriate: allergies, current medications, past family history, past medical history, past social history, past surgical history, and problem list.  Review of Systems Pertinent items are noted in HPI.   Objective:   Blood pressure (!) 149/71, pulse 89, height 5\' 2"  (1.575 m), weight 116 lb (52.6 kg). Body mass index is 21.22 kg/m. Well nourished, well hydrated White female, no apparent distress She is ambulating and conversing normally. She has hearing aids. Vulva with marked VVA, no evidence of lichen or dysplasia Speculum exam reveals atrophy, only grade 1 prolapse  I fitted her with a #2 ring with support. She reported that she could not even feel that it was inside. She did not have any symptoms of prolapse while it was inside, tried sitting on toilet, doing a Valsalva, squatting, etc.  Assessment:   1. Lichen sclerosus et atrophicus   2. Vaginal prolapse      Plan:   1. Lichen sclerosus et atrophicus - continue current care. We discussed the increased risk of vulvar  cancer and rec monthly SVEs  2. Vaginal prolapse - On exam the prolapse is relatively small. But with her symptoms, I have offered a pessary versus referral to [facility] gynecology for possible surgery.  She opts for pessary. The appropriate pessary will be ordered and and she will come back to see if she can place and remove it.

## 2023-09-03 ENCOUNTER — Ambulatory Visit: Payer: Medicare HMO | Admitting: Internal Medicine

## 2023-09-03 ENCOUNTER — Encounter: Payer: Self-pay | Admitting: Internal Medicine

## 2023-09-03 VITALS — BP 122/62 | HR 97 | Temp 98.2°F | Ht 62.0 in | Wt 114.0 lb

## 2023-09-03 DIAGNOSIS — K5000 Crohn's disease of small intestine without complications: Secondary | ICD-10-CM

## 2023-09-03 DIAGNOSIS — G3184 Mild cognitive impairment, so stated: Secondary | ICD-10-CM

## 2023-09-03 DIAGNOSIS — F39 Unspecified mood [affective] disorder: Secondary | ICD-10-CM

## 2023-09-03 DIAGNOSIS — Z0000 Encounter for general adult medical examination without abnormal findings: Secondary | ICD-10-CM | POA: Diagnosis not present

## 2023-09-03 DIAGNOSIS — I700 Atherosclerosis of aorta: Secondary | ICD-10-CM | POA: Diagnosis not present

## 2023-09-03 DIAGNOSIS — I872 Venous insufficiency (chronic) (peripheral): Secondary | ICD-10-CM | POA: Diagnosis not present

## 2023-09-03 DIAGNOSIS — I10 Essential (primary) hypertension: Secondary | ICD-10-CM

## 2023-09-03 MED ORDER — AMLODIPINE BESYLATE 5 MG PO TABS: 5.0000 mg | ORAL_TABLET | Freq: Every day | ORAL | 3 refills | Status: DC

## 2023-09-03 NOTE — Assessment & Plan Note (Signed)
Uses the furosemide 40mg  daily --can be prn

## 2023-09-03 NOTE — Assessment & Plan Note (Signed)
Mild things Daughter will help with finances

## 2023-09-03 NOTE — Progress Notes (Signed)
Subjective:    Patient ID: [name], female    DOB: Mar 31, 1941, 82 y.o.   MRN: [id number]  HPI Here for Medicare wellness visit and follow up of chronic health conditions Daughter Carollee Herter on speaker phone Reviewed advanced directives Reviewed other doctors--Dr Dove--gyn, Dr Eureka Sink, Dr Moshe Cipro, Dr Jenita Seashore, Dr Hilty--cardiology, Dr Lissa Hoard, Dr Emmie Niemann, Lipid clinic No hospitalizations or surgery in the past year Not doing much exercise--discussed. Is trying to walk more--with husband Vision okay Hearing is poor---has aides No falls Independent with instrumental ADLs Mild memory issues  No chest pain or SOB No dizziness or syncope Ongoing edema ---does take the furosemide No palpitations Headaches are reasonably controlled  Does get some low right  back pain Pain moves around Walking is fine with this  Daughter notes she gets real anxious at times Hasn't been using the tranxene much---last refilled 2022 No depression and not anhedonia Gets anxious about her forgetfulness as well. No functional changes though  Current Outpatient Medications on File Prior to Visit  Medication Sig Dispense Refill   amLODipine (NORVASC) 5 MG tablet Take 1 tablet (5 mg total) by mouth daily. 90 tablet 1   aspirin EC 81 MG EC tablet Take 1 tablet (81 mg total) by mouth daily. Swallow whole. 30 tablet 11   baclofen (LIORESAL) 10 MG tablet Take 10 mg by mouth daily as needed. For headache     carbamide peroxide (DEBROX) 6.5 % OTIC solution Place 5 drops into the right ear daily as needed (for wax build up).     clorazepate (TRANXENE) 7.5 MG tablet Take 1 tablet (7.5 mg total) by mouth 2 (two) times daily as needed for anxiety. 20 tablet 0   Evolocumab (REPATHA SURECLICK) 140 MG/ML SOAJ Inject 140 mg into the skin every 14 (fourteen) days. 6 mL 3   furosemide (LASIX) 40 MG tablet Take 1 tablet (40 mg total) by mouth daily. Hold if no AM leg swelling  30 tablet 3   losartan (COZAAR) 25 MG tablet Take 1 tablet by mouth once daily 90 tablet 0   mometasone (ELOCON) 0.1 % cream Apply topically 2 (two) times daily as needed.     Multiple Vitamin (MULTIVITAMIN) tablet Take 1 tablet by mouth daily.     Potassium Gluconate 550 MG TABS Take 1,100 mg by mouth daily.     Probiotic Product (PROBIOTIC PO) Take 1 capsule by mouth daily.     triamcinolone ointment (KENALOG) 0.1 % Apply three times weekly, increasing to daily for flares as needed 30 g 4   vitamin B-12 (CYANOCOBALAMIN) 1000 MCG tablet Take 1,000 mcg by mouth daily.     zonisamide (ZONEGRAN) 100 MG capsule Take 100 mg by mouth daily.     No current facility-administered medications on file prior to visit.    Allergies  Allergen Reactions   Clonazepam Swelling and Other (See Comments)    REACTION: Swollen lips, sore mouth   Penicillins Other (See Comments)    Unrecalled reaction Has patient had a PCN reaction causing immediate rash, facial/tongue/throat swelling, SOB or lightheadedness with hypotension: Unknown Has patient had a PCN reaction causing severe rash involving mucus membranes or skin necrosis: Unknown Has patient had a PCN reaction that required hospitalization: Unknown Has patient had a PCN reaction occurring within the last 10 years: No If all of the above answers are "NO", then may proceed with Cephalosporin use.    Statins Other (See Comments)    Severe muscle pain    Past  Medical History:  Diagnosis Date   Anxiety    Aortic atherosclerosis (HCC)    GERD (gastroesophageal reflux disease)    Dr. Juanda Chance   H/O abnormal weight loss    lost over 20 lbs in the last year   Hard of hearing    deaf in Right ear   History of Bell's palsy 02/2011   HLD (hyperlipidemia)    Dr. Vira Blanco (hard of hearing)    uses hearing aid on left side/complete loss of hearing right side   IBS (irritable bowel syndrome)    LBBB (left bundle branch block)    Migraine    OA  (osteoarthritis)    Osteopenia 2005   Ulcer 2003   bleeding stomach ulcer    Past Surgical History:  Procedure Laterality Date   ABDOMINAL HYSTERECTOMY  1983   APPENDECTOMY  1983   BOWEL RESECTION N/A 01/20/2019   Procedure: DIAGNOSTIC LAP WITH SMALL BOWEL RESECTION;  Surgeon: Rodman Pickle, MD;  Location: [facility];  Service: General;  Laterality: N/A;   CARDIAC CATHETERIZATION  1997   okay, mild changes   CHOLECYSTECTOMY  1992   COLONOSCOPY  2003   DEXA N/A 2005   borderline osteopenia   DIAGNOSTIC LAPAROSCOPY  11/25/2018   LAPAROSCOPY N/A 11/25/2018   Procedure: LAPAROSCOPY DIAGNOSTIC;  Surgeon: Rodman Pickle, MD;  Location: [facility];  Service: General;  Laterality: N/A;   TONSILLECTOMY AND ADENOIDECTOMY  1953   UPPER GASTROINTESTINAL ENDOSCOPY  2003    Family History  Problem Relation Age of Onset   Heart attack Father 60   Diabetes Father    Coronary artery disease Father    COPD Mother    Hypertension Mother    Hypertension Sister    Pulmonary fibrosis Sister    Cancer Daughter        breast   Rheum arthritis Daughter    Colon cancer Neg Hx    Esophageal cancer Neg Hx    Rectal cancer Neg Hx     Social History   Socioeconomic History   Marital status: Married    Spouse name: Windy Fast   Number of children: 2   Years of education: Not on file   Highest education level: Not on file  Occupational History   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  Tobacco Use   Smoking status: Never    Passive exposure: Past   Smokeless tobacco: Never  Vaping Use   Vaping status: Never Used  Substance and Sexual Activity   Alcohol use: No   Drug use: No   Sexual activity: Not on file  Other Topics Concern   Not on file  Social History Narrative   Has living will   Husband, then daughters, are [facility] care POA   Would accept resuscitation attempts but no prolonged artifical ventilation   No prolonged tube feeds if cognitively impaired    Social Determinants of Health   Financial Resource Strain: Not on file  Food Insecurity: Not on file  Transportation Needs: Not on file  Physical Activity: Not on file  Stress: Not on file  Social Connections: Not on file  Intimate Partner Violence: Not on file   Review of Systems Not a big appetite Weight is fairly stable Sleeps fairly well Wears seat belt Teeth are okay---does see dentist No heartburn--rare dysphagia (if she eats too fast) Bowels move okay--no blood No sig joint pains No suspicious skin lesions    Objective:   Physical  Exam Constitutional:      Appearance: Normal appearance.  HENT:     Mouth/Throat:     Pharynx: No oropharyngeal exudate or posterior oropharyngeal erythema.  Eyes:     Conjunctiva/sclera: Conjunctivae normal.     Pupils: Pupils are equal, round, and reactive to light.  Cardiovascular:     Rate and Rhythm: Normal rate and regular rhythm.     Pulses: Normal pulses.     Heart sounds: No murmur heard.    No gallop.  Pulmonary:     Effort: Pulmonary effort is normal.     Breath sounds: Normal breath sounds. No wheezing or rales.  Abdominal:     Palpations: Abdomen is soft.     Tenderness: There is no abdominal tenderness.  Musculoskeletal:     Cervical back: Neck supple.     Comments: Mild non pitting pedal edema  Lymphadenopathy:     Cervical: No cervical adenopathy.  Skin:    Findings: No rash.  Neurological:     General: No focal deficit present.     Mental Status: She is alert and oriented to person, place, and time.     Comments: Word naming--- 8/1 minute Recall-- 2/3  Psychiatric:        Mood and Affect: Mood normal.        Behavior: Behavior normal.            Assessment & Plan:

## 2023-09-03 NOTE — Assessment & Plan Note (Signed)
Gets episodic anxiety Has the tranxene for rare use

## 2023-09-03 NOTE — Assessment & Plan Note (Signed)
On imaging Also with history of TIA---is on repatha now

## 2023-09-03 NOTE — Assessment & Plan Note (Signed)
Has been quiet

## 2023-09-03 NOTE — Assessment & Plan Note (Signed)
I have personally reviewed the Medicare Annual Wellness questionnaire and have noted 1. The patient's medical and social history 2. Their use of alcohol, tobacco or illicit drugs 3. Their current medications and supplements 4. The patient's functional ability including ADL's, fall risks, home safety risks and hearing or visual             impairment. 5. Diet and physical activities 6. Evidence for depression or mood disorders  The patients weight, height, BMI and visual acuity have been recorded in the chart I have made referrals, counseling and provided education to the patient based review of the above and I have provided the pt with a written personalized care plan for preventive services.  I have provided you with a copy of your personalized plan for preventive services. Please take the time to review along with your updated medication list.  Done with cancer screening UTD with vaccines--did have flu/COVID updates this year Discussed exercise--especially adding resistance work

## 2023-09-03 NOTE — Progress Notes (Signed)
Hearing Screening - Comments:: Has hearing aid. Wearing today Vision Screening - Comments:: April 2024

## 2023-09-03 NOTE — Assessment & Plan Note (Signed)
BP Readings from Last 3 Encounters:  09/03/23 122/62  08/24/23 (!) 149/71  07/31/23 106/60   Controlled with amlodipine 5mg  and losartan 25

## 2023-09-07 ENCOUNTER — Telehealth: Payer: Self-pay | Admitting: Internal Medicine

## 2023-09-07 DIAGNOSIS — H2513 Age-related nuclear cataract, bilateral: Secondary | ICD-10-CM | POA: Diagnosis not present

## 2023-09-07 NOTE — Telephone Encounter (Signed)
Yes I would like to have her set up for transfer of care appointment before she is due for her 2025 annual Medicare wellness.  I would suggest she makes a transfer of care appointment in approximately 6 months.

## 2023-09-07 NOTE — Telephone Encounter (Signed)
Patient was seen on 09/03/2023 for AWV with Dr. Alphonsus Sias. Patient is requesting a TOC with Dr. Ermalene Searing if able to, okay to make patient appointment for [facility] with [facility]? Patient will be due for annual wellness after 09/02/2024.

## 2023-09-07 NOTE — Progress Notes (Signed)
Message sent to [facility] pool for Dr. Daphine Deutscher approval for [facility]

## 2023-09-07 NOTE — Telephone Encounter (Signed)
Called and left voicemail per dpr advising of Dr. Albertina Senegal office.

## 2023-09-09 ENCOUNTER — Other Ambulatory Visit: Payer: Self-pay | Admitting: *Deleted

## 2023-09-09 DIAGNOSIS — E785 Hyperlipidemia, unspecified: Secondary | ICD-10-CM

## 2023-09-14 ENCOUNTER — Telehealth: Payer: Self-pay | Admitting: Internal Medicine

## 2023-09-14 NOTE — Telephone Encounter (Signed)
Patient wants lab orders to do labs at [facility] in [place] at [facility] prior to visit on 11/13.

## 2023-09-14 NOTE — Telephone Encounter (Signed)
Spoke to pt. She states she would like the lab slip sent to her.  Slip placed in outgoing mail.

## 2023-09-14 NOTE — Telephone Encounter (Signed)
Called patient LM to call office.

## 2023-09-14 NOTE — Telephone Encounter (Signed)
Follow Up:      Patient says she is returning a call from today. She did not know who it was that called.

## 2023-09-23 ENCOUNTER — Telehealth: Payer: Self-pay | Admitting: Obstetrics & Gynecology

## 2023-09-23 NOTE — Telephone Encounter (Addendum)
Pessary arrived. I contacted pt to schedule appointment for placement with Dr. Marice Potter. I left message for the patient to call back for scheduling. Please return message to Crystal w/appointment date.

## 2023-09-24 NOTE — Telephone Encounter (Signed)
I contacted the patient via phone, she states she is not sure right if she wants to have the pessary.. She is having nervousness and would like to call back if she decides to go thru with it.

## 2023-09-24 NOTE — Telephone Encounter (Signed)
Noted  

## 2023-10-14 LAB — NMR, LIPOPROFILE
Cholesterol, Total: 119 mg/dL (ref 100–199)
HDL Particle Number: 44.8 umol/L (ref >=30.5)
HDL-C: 56 mg/dL (ref >39)
LDL Particle Number: 395 nmol/L (ref <1000)
LDL Size: 19.9 nmol — ABNORMAL LOW (ref >20.5)
LDL-C (NIH Calc): 31 mg/dL (ref 0–99)
LP-IR Score: 67 — ABNORMAL HIGH (ref <=45)
Small LDL Particle Number: 278 nmol/L (ref <=527)
Triglycerides: 205 mg/dL — ABNORMAL HIGH (ref 0–149)

## 2023-10-20 NOTE — Progress Notes (Unsigned)
Cardiology Office Note:  .   Date:  10/21/2023  ID:  [name], DOB Jun 01, 1941, MRN [id number] PCP: Karie Schwalbe, MD  [facility] Providers Cardiologist:  None    Patient Profile: .      PMH Dyslipidemia Aortic atherosclerosis Left BBB Statin myalgia Family history early CAD Father had MI age 81  Referred to advanced lipid disorder clinic and seen by Dr. Rennis Golden on 07/18/2022.  Lipid profile February 2023 showed total cholesterol 225, triglycerides 121, HDL 55, and LDL 146.  Unfortunately she has been intolerant to statins which caused severe muscle pain.  Her father had an MI at age 58.  She underwent cardiac catheterization in 1997 that showed only mild abnormalities in the coronary arteries. She was started on Praluent which she tolerated well. Lp(a) was negative at 8.4 nmol/L and lipids improved with total cholesterol 116, LDL 31, HDL 50, and triglycerides 782.       History of Present Illness: .   [name] is a very pleasant 82 y.o. female who is here today for follow-up of dyslipidemia. Lipid panel 10/13/23 revealed total cholesterol 119, triglycerides 205, HDL 56, LDL-C 31 and LDL particle number 395. She is accompanied by her daughter due to concerns with memory loss, she has to have a companion. She maintains a diet primarily consisting of vegetables and small portions of meat. She admits to consuming bread, particularly during lunchtime, and enjoys yeast rolls on Sundays. Dessert intake includes three lemon Oreos at lunch and ice cream on Sundays. She also drinks juice in small quantities. She denies concerning cardiac symptoms. She frequently has right shoulder pain, which she manages with a heating pad. The pain is not associated with physical activity and varies with movement and position changes. [place] but attempts to walk up and down her driveway when possible. No concerns with Repatha injections. Asks if her  PCP can manage Repatha going forward. She plans to change insurance companies in January and requests a paper prescription to facilitate this transition.   Discussed the use of AI scribe software for clinical note transcription with the patient, who gave verbal consent to proceed.   ROS: See HPI       Studies Reviewed: .        Risk Assessment/Calculations:             Physical Exam:   VS:  BP 126/64   Pulse 80   Ht 5\' 2"  (1.575 m)   Wt 119 lb 14.4 oz (54.4 kg)   SpO2 95%   BMI 21.93 kg/m    Wt Readings from Last 3 Encounters:  10/21/23 119 lb 14.4 oz (54.4 kg)  09/03/23 114 lb (51.7 kg)  08/24/23 116 lb (52.6 kg)    GEN: Well nourished, well developed in no acute distress NECK: No JVD; No carotid bruits CARDIAC: RRR, soft systolic murmurs RUSB. No rubs, gallops RESPIRATORY:  Clear to auscultation without rales, wheezing or rhonchi  ABDOMEN: Soft, non-tender, non-distended EXTREMITIES:  No edema; No deformity     ASSESSMENT AND PLAN: .    Dyslipidemia: LDL-C 56, triglycerides 205, HDL-C 56, total cholesterol 119 on 10/13/23.  Will.  We discussed mildly elevated triglycerides. No concerning side effects with Repatha. She admits to eating 3 Oreo cookies at lunchtime as well as ice cream on Sundays. She gets limited exercise.  Advised that triglyceride elevation is not concerning for pancreatitis. Consider walking for 10  minutes after meals and consuming less sugar and full fat dairy products.  We will continue Repatha.  She has asked for a paper prescription due to an upcoming change in her insurance and she is not sure which pharmacy she will be using.  Murmur: She has a soft systolic murmur in the right upper sternal border on exam. Reviewed echo from 01/2022 with patient and her daughter which revealed normal LVEF, G1 DD, mild LVH, no significant valve disease. She is asymptomatic. Advised we will continue to monitor clinically for now and to report concerning symptoms if they  arise.   Shoulder pain: She reports intermittent right shoulder pain that seems to be exacerbated by position changes. Pain does not worsen with exertion. Symptoms improve with use of heat. Symptoms atypical for angina.  Recommended she continue current strategies of heating pad and positioning for comfort.        Dispo: 1 year with Dr. Rennis Golden or me  Signed, Eligha Bridegroom, NP-C

## 2023-10-21 ENCOUNTER — Ambulatory Visit (HOSPITAL_BASED_OUTPATIENT_CLINIC_OR_DEPARTMENT_OTHER): Payer: Medicare HMO | Admitting: Nurse Practitioner

## 2023-10-21 ENCOUNTER — Encounter (HOSPITAL_BASED_OUTPATIENT_CLINIC_OR_DEPARTMENT_OTHER): Payer: Self-pay | Admitting: Nurse Practitioner

## 2023-10-21 VITALS — BP 126/64 | HR 80 | Ht 62.0 in | Wt 119.9 lb

## 2023-10-21 DIAGNOSIS — E785 Hyperlipidemia, unspecified: Secondary | ICD-10-CM | POA: Diagnosis not present

## 2023-10-21 DIAGNOSIS — M791 Myalgia, unspecified site: Secondary | ICD-10-CM

## 2023-10-21 DIAGNOSIS — M25511 Pain in right shoulder: Secondary | ICD-10-CM | POA: Diagnosis not present

## 2023-10-21 DIAGNOSIS — G8929 Other chronic pain: Secondary | ICD-10-CM | POA: Diagnosis not present

## 2023-10-21 DIAGNOSIS — R011 Cardiac murmur, unspecified: Secondary | ICD-10-CM | POA: Diagnosis not present

## 2023-10-21 DIAGNOSIS — Z8249 Family history of ischemic heart disease and other diseases of the circulatory system: Secondary | ICD-10-CM | POA: Diagnosis not present

## 2023-10-21 DIAGNOSIS — T466X5A Adverse effect of antihyperlipidemic and antiarteriosclerotic drugs, initial encounter: Secondary | ICD-10-CM

## 2023-10-21 DIAGNOSIS — T466X5D Adverse effect of antihyperlipidemic and antiarteriosclerotic drugs, subsequent encounter: Secondary | ICD-10-CM | POA: Diagnosis not present

## 2023-10-21 MED ORDER — REPATHA SURECLICK 140 MG/ML ~~LOC~~ SOAJ: 1.0000 mL | SUBCUTANEOUS | 3 refills | Status: DC

## 2023-10-21 NOTE — Patient Instructions (Signed)
Medication Instructions:   Your physician recommends that you continue on your current medications as directed. Please refer to the Current Medication list given to you today.   *If you need a refill on your cardiac medications before your next appointment, please call your pharmacy*   Lab Work:  None ordered.  If you have labs (blood work) drawn today and your tests are completely normal, you will receive your results only by: MyChart Message (if you have MyChart) OR A paper copy in the mail If you have any lab test that is abnormal or we need to change your treatment, we will call you to review the results.   Testing/Procedures:  None ordered.   Follow-Up: At [facility], you and your health needs are our priority.  As part of our continuing mission to provide you with exceptional heart care, we have created designated Provider Care Teams.  These Care Teams include your primary Cardiologist (physician) and Advanced Practice Providers (APPs -  Physician Assistants and Nurse Practitioners) who all work together to provide you with the care you need, when you need it.  We recommend signing up for the patient portal called "MyChart".  Sign up information is provided on this After Visit Summary.  MyChart is used to connect with patients for Virtual Visits (Telemedicine).  Patients are able to view lab/test results, encounter notes, upcoming appointments, etc.  Non-urgent messages can be sent to your provider as well.   To learn more about what you can do with MyChart, go to [email or web address].    Your next appointment:   1 year(s)  Provider:   K. Italy Hilty, MD or Lebron Conners   Other Instructions  Your physician wants you to follow-up in: 1 year.  You will receive a reminder letter in the mail two months in advance. If you don't receive a letter, please call our office to schedule the follow-up appointment.

## 2023-11-17 ENCOUNTER — Other Ambulatory Visit: Payer: Self-pay | Admitting: Internal Medicine

## 2023-11-25 ENCOUNTER — Other Ambulatory Visit: Payer: Self-pay | Admitting: Internal Medicine

## 2023-11-25 DIAGNOSIS — E785 Hyperlipidemia, unspecified: Secondary | ICD-10-CM

## 2023-11-25 DIAGNOSIS — T466X5A Adverse effect of antihyperlipidemic and antiarteriosclerotic drugs, initial encounter: Secondary | ICD-10-CM

## 2023-11-25 DIAGNOSIS — I700 Atherosclerosis of aorta: Secondary | ICD-10-CM

## 2023-12-31 ENCOUNTER — Other Ambulatory Visit (HOSPITAL_COMMUNITY): Payer: Self-pay

## 2023-12-31 ENCOUNTER — Telehealth: Payer: Self-pay | Admitting: Pharmacy Technician

## 2023-12-31 ENCOUNTER — Telehealth: Payer: Self-pay | Admitting: Internal Medicine

## 2023-12-31 NOTE — Telephone Encounter (Signed)
Pharmacy Patient Advocate Encounter   Received notification from Pt Calls Messages that prior authorization for repatha is required/requested.   Insurance verification completed.   The patient is insured through [facility] .   Per test claim: PA required; PA submitted to above mentioned insurance via CoverMyMeds Key/confirmation #/EOC [id number] Status is pending

## 2023-12-31 NOTE — Telephone Encounter (Signed)
Pt c/o medication issue:  1. Name of Medication:   REPATHA SURECLICK 140 MG/ML SOAJ    2. How are you currently taking this medication (dosage and times per day)? As written  3. Are you having a reaction (difficulty breathing--STAT)? No   4. What is your medication issue? [facility] Medicare sent a letter to pt and said they sent one to [place] needing more info to reifll medication

## 2023-12-31 NOTE — Telephone Encounter (Signed)
Pharmacy Patient Advocate Encounter  Received notification from [facility] that Prior Authorization for repatha has been APPROVED from 12/31/23 to 06/29/24   PA #/Case ID/Reference #: [id number]

## 2023-12-31 NOTE — Telephone Encounter (Signed)
Called and spoke to [name] patient's daughter. She stated patient has changed insurance to [facility] and no longer has Community education officer. [employer] is requesting a PA for her Repatha.

## 2023-12-31 NOTE — Telephone Encounter (Signed)
Called and spoke to patient's daughter. Notified her that PA approved for Repatha and she can call pharmacy for medication.

## 2024-01-06 ENCOUNTER — Other Ambulatory Visit: Payer: Self-pay | Admitting: Internal Medicine

## 2024-01-18 ENCOUNTER — Ambulatory Visit: Payer: Medicare Other | Admitting: Dermatology

## 2024-01-18 DIAGNOSIS — L900 Lichen sclerosus et atrophicus: Secondary | ICD-10-CM | POA: Diagnosis not present

## 2024-01-18 MED ORDER — TRIAMCINOLONE ACETONIDE 0.1 % EX OINT: TOPICAL_OINTMENT | CUTANEOUS | 4 refills | Status: AC

## 2024-01-18 NOTE — Progress Notes (Signed)
   Follow-Up Visit   Subjective  [name] is a 83 y.o. female who presents for the following: Lichen Sclerosis of the vaginal area, 6 month follow-up. No symptoms, well-controlled using TMC 0.1% ointment twice a week.  Patient with vaginal prolapse.   The following portions of the chart were reviewed this encounter and updated as appropriate: medications, allergies, medical history  Review of Systems:  No other skin or systemic complaints except as noted in HPI or Assessment and Plan.  Objective  Well appearing patient in no apparent distress; mood and affect are within normal limits.  A focused examination was performed of the following areas: Vaginal area  Relevant physical exam findings are noted in the Assessment and Plan.    Assessment & Plan  LICHEN SCLEROSUS ET ATROPHICUS Exam: Erythema at inferior introitus and medial labia minora in fold, no ulcerations or purpura  Chronic condition with duration or expected duration over one year. Currently well-controlled.   Lichen sclerosus is a chronic inflammatory condition of unknown cause that frequently involves the vaginal area and less commonly extragenital skin, and is NOT sexually transmitted. It frequently causes symptoms of pain and burning.  It requires regular monitoring and treatment with topical steroids to minimize inflammation and to reduce risk of scarring. There is also a risk of cancer in the vaginal area which is very low if inflammation is well controlled. Regular checks of the area are recommended. Please call if you notice any new or changing spots within this area.  Treatment Plan: Continue triamcinolone  0.1% ointment apply to affected area 2x/wk as maintenance.  May increase to daily prn flares.  LICHEN SCLEROSUS ET ATROPHICUS   Related Medications triamcinolone  ointment (KENALOG ) 0.1 % Apply three times weekly, increasing to daily for flares as needed  Return in about 1 year (around 01/17/2025) for  Lichen Sclerosus.  IBernardine Bridegroom, CMA, am acting as scribe for Artemio Larry, MD .   Documentation: I have reviewed the above documentation for accuracy and completeness, and I agree with the above.  Artemio Larry, MD

## 2024-01-18 NOTE — Patient Instructions (Signed)

## 2024-02-01 ENCOUNTER — Other Ambulatory Visit: Payer: Self-pay | Admitting: Internal Medicine

## 2024-02-09 DIAGNOSIS — I890 Lymphedema, not elsewhere classified: Secondary | ICD-10-CM | POA: Diagnosis not present

## 2024-02-11 DIAGNOSIS — G43719 Chronic migraine without aura, intractable, without status migrainosus: Secondary | ICD-10-CM | POA: Diagnosis not present

## 2024-03-08 ENCOUNTER — Ambulatory Visit (INDEPENDENT_AMBULATORY_CARE_PROVIDER_SITE_OTHER)
Admission: RE | Admit: 2024-03-08 | Discharge: 2024-03-08 | Disposition: A | Discharge status: Home / Self Care | Source: Ambulatory Visit | Attending: Family Medicine | Admitting: Family Medicine

## 2024-03-08 ENCOUNTER — Ambulatory Visit (INDEPENDENT_AMBULATORY_CARE_PROVIDER_SITE_OTHER): Payer: Medicare HMO | Admitting: Family Medicine

## 2024-03-08 ENCOUNTER — Encounter: Payer: Self-pay | Admitting: Family Medicine

## 2024-03-08 VITALS — BP 118/60 | HR 73 | Temp 98.1°F | Ht 61.5 in | Wt 113.0 lb

## 2024-03-08 DIAGNOSIS — K219 Gastro-esophageal reflux disease without esophagitis: Secondary | ICD-10-CM

## 2024-03-08 DIAGNOSIS — I872 Venous insufficiency (chronic) (peripheral): Secondary | ICD-10-CM | POA: Diagnosis not present

## 2024-03-08 DIAGNOSIS — G43909 Migraine, unspecified, not intractable, without status migrainosus: Secondary | ICD-10-CM | POA: Diagnosis not present

## 2024-03-08 DIAGNOSIS — K582 Mixed irritable bowel syndrome: Secondary | ICD-10-CM

## 2024-03-08 DIAGNOSIS — M545 Low back pain, unspecified: Secondary | ICD-10-CM

## 2024-03-08 DIAGNOSIS — E785 Hyperlipidemia, unspecified: Secondary | ICD-10-CM | POA: Diagnosis not present

## 2024-03-08 DIAGNOSIS — E538 Deficiency of other specified B group vitamins: Secondary | ICD-10-CM

## 2024-03-08 DIAGNOSIS — I10 Essential (primary) hypertension: Secondary | ICD-10-CM

## 2024-03-08 DIAGNOSIS — G3184 Mild cognitive impairment, so stated: Secondary | ICD-10-CM | POA: Diagnosis not present

## 2024-03-08 DIAGNOSIS — G8929 Other chronic pain: Secondary | ICD-10-CM

## 2024-03-08 DIAGNOSIS — K449 Diaphragmatic hernia without obstruction or gangrene: Secondary | ICD-10-CM | POA: Insufficient documentation

## 2024-03-08 DIAGNOSIS — I700 Atherosclerosis of aorta: Secondary | ICD-10-CM | POA: Diagnosis not present

## 2024-03-08 DIAGNOSIS — M899 Disorder of bone, unspecified: Secondary | ICD-10-CM

## 2024-03-08 DIAGNOSIS — Z789 Other specified health status: Secondary | ICD-10-CM

## 2024-03-08 DIAGNOSIS — Z8673 Personal history of transient ischemic attack (TIA), and cerebral infarction without residual deficits: Secondary | ICD-10-CM

## 2024-03-08 DIAGNOSIS — M47816 Spondylosis without myelopathy or radiculopathy, lumbar region: Secondary | ICD-10-CM | POA: Diagnosis not present

## 2024-03-08 DIAGNOSIS — K5090 Crohn's disease, unspecified, without complications: Secondary | ICD-10-CM | POA: Diagnosis not present

## 2024-03-08 DIAGNOSIS — F39 Unspecified mood [affective] disorder: Secondary | ICD-10-CM

## 2024-03-08 MED ORDER — LIDOCAINE 5 % EX PTCH: 1.0000 | MEDICATED_PATCH | CUTANEOUS | 1 refills | Status: AC

## 2024-03-08 NOTE — Assessment & Plan Note (Addendum)
 Chronic, controlled with compression hose.  Controlled  with three times a week lasix.Marland Kitchen uses potassium with it.

## 2024-03-08 NOTE — Assessment & Plan Note (Signed)
 Chronic, gradaully worsening per pt.  No issue remebering to take her med.  Gradual onset... MMSE for insurance was normal per daughter.

## 2024-03-08 NOTE — Assessment & Plan Note (Addendum)
 Chronic, S/P  partial colectomy.

## 2024-03-08 NOTE — Assessment & Plan Note (Signed)
 Left thalamus 2023  On ASA EC 81 mg daily

## 2024-03-08 NOTE — Assessment & Plan Note (Signed)
 On supplement

## 2024-03-08 NOTE — Assessment & Plan Note (Signed)
 IMprove

## 2024-03-08 NOTE — Assessment & Plan Note (Signed)
 Stable, chronic.  Continue current medication.  amlodipine 5 mg daily and losartan 25 mg daily.

## 2024-03-08 NOTE — Assessment & Plan Note (Signed)
 Chronic, but rarely .  Rarely uses tranxene... has not used  more than 4-5 times a year.

## 2024-03-08 NOTE — Assessment & Plan Note (Signed)
 On repatha

## 2024-03-08 NOTE — Assessment & Plan Note (Addendum)
 No statin due to apparent side effects.  On repatha q 2 weeks per lipid clinic.  Wished to have lipids managed here... lipid clinic agreeable... needs reauthorization for repatha.  Referred to pharmacy for assistance.

## 2024-03-08 NOTE — Patient Instructions (Signed)
 Continue heat on low back, apply lidocaine patch, start home therapy on low back.  We will call with X-ray result.

## 2024-03-08 NOTE — Progress Notes (Signed)
 Patient ID: MILILANI MURTHY, female    DOB: 03-11-1941, 83 y.o.   MRN: [id number]  This visit was conducted in person.  BP 118/60 (BP Location: Left Arm, Patient Position: Sitting, Cuff Size: Normal)   Pulse 73   Temp 98.1 F (36.7 C) (Temporal)   Ht 5' 1.5" (1.562 m)   Wt 113 lb (51.3 kg)   SpO2 98%   BMI 21.01 kg/m    CC:  Chief Complaint  Patient presents with   Establish Care    Subjective:   HPI: COURNEY GARROD is a 83 y.o. female presenting on 03/08/2024 for Establish Care  Previous PCP: Alphonsus Sias Last Medicare wellness: September 03, 2023  She presents to office today with her daughter Carollee Herter.  Reviewed other doctors--Dr Dove--gyn, Dr Fortuna Sink, Dr Moshe Cipro, Dr Jenita Seashore, Dr Hilty--cardiology, Dr Lissa Hoard, Dr Emmie Niemann, Lipid clinic    Hyperlipidemia, aortic atherosclerosis: Not currently on statin given past apparent  side effects.  On repatha  q 2 weeks per lipid clinic.  Last lipid panel 10/2023  LDL 31   Crohn's disease: inactive, S? Partial colectomy  Mild cognitive impairment, history of CVA ( likely TIA)  Has history of episodic anxiety: Treated by past PCP with tranxiene.. rarely uses.  Hypertension:  Well-controlled on amlodipine 5 mg and losartan 25 mg daily.  BP Readings from Last 3 Encounters:  03/08/24 118/60  10/21/23 126/64  09/03/23 122/62  Using medication without problems or lightheadedness:  none Chest pain with exertion: none Edema:  yes, right > left  Short of breath: none Average home BPs: Other issues:    Chronic back pain for 1 yuear.. use heating pad  daily in last 6 months, right, no radiation to leg. No numbness or weakness.  No dysuria, occ dribbling of urine.  Relevant past medical, surgical, family and social history reviewed and updated as indicated. Interim medical history since our last visit reviewed. Allergies and medications reviewed and updated. Outpatient Medications Prior  to Visit  Medication Sig Dispense Refill   amLODipine (NORVASC) 5 MG tablet Take 1 tablet (5 mg total) by mouth daily. 90 tablet 3   aspirin EC 81 MG EC tablet Take 1 tablet (81 mg total) by mouth daily. Swallow whole. 30 tablet 11   baclofen (LIORESAL) 10 MG tablet Take 10 mg by mouth daily as needed. For headache     carbamide peroxide (DEBROX) 6.5 % OTIC solution Place 5 drops into the right ear daily as needed (for wax build up).     clorazepate (TRANXENE) 7.5 MG tablet Take 1 tablet by mouth twice daily as needed for anxiety 20 tablet 0   furosemide (LASIX) 40 MG tablet TAKE 1 TABLET BY MOUTH ONCE DAILY. HOLD IF NO AM LEG SWELLING 90 tablet 0   losartan (COZAAR) 25 MG tablet Take 1 tablet by mouth once daily 90 tablet 1   mometasone (ELOCON) 0.1 % cream Apply topically 2 (two) times daily as needed.     Multiple Vitamin (MULTIVITAMIN) tablet Take 1 tablet by mouth daily.     Potassium Gluconate 550 MG TABS Take 1,100 mg by mouth daily.     Probiotic Product (PROBIOTIC PO) Take 1 capsule by mouth daily.     REPATHA SURECLICK 140 MG/ML SOAJ INJECT 140 MG INTO THE SKIN EVERY 14 DAYS 6 mL 0   triamcinolone ointment (KENALOG) 0.1 % Apply three times weekly, increasing to daily for flares as needed 30 g 4   vitamin B-12 (CYANOCOBALAMIN) 1000 MCG  tablet Take 1,000 mcg by mouth daily.     zonisamide (ZONEGRAN) 100 MG capsule Take 100 mg by mouth daily.     No facility-administered medications prior to visit.     Per HPI unless specifically indicated in ROS section below Review of Systems  Constitutional:  Negative for fatigue and fever.  HENT:  Negative for congestion.   Eyes:  Negative for pain.  Respiratory:  Negative for cough and shortness of breath.   Cardiovascular:  Negative for chest pain, palpitations and leg swelling.  Gastrointestinal:  Negative for abdominal pain.  Genitourinary:  Negative for dysuria and vaginal bleeding.  Musculoskeletal:  Positive for back pain.   Neurological:  Positive for headaches. Negative for syncope and light-headedness.  Psychiatric/Behavioral:  Negative for dysphoric mood.    Objective:  BP 118/60 (BP Location: Left Arm, Patient Position: Sitting, Cuff Size: Normal)   Pulse 73   Temp 98.1 F (36.7 C) (Temporal)   Ht 5' 1.5" (1.562 m)   Wt 113 lb (51.3 kg)   SpO2 98%   BMI 21.01 kg/m   Wt Readings from Last 3 Encounters:  03/08/24 113 lb (51.3 kg)  10/21/23 119 lb 14.4 oz (54.4 kg)  09/03/23 114 lb (51.7 kg)      Physical Exam Constitutional:      General: She is not in acute distress.    Appearance: Normal appearance. She is well-developed. She is not ill-appearing or toxic-appearing.  HENT:     Head: Normocephalic.     Right Ear: Hearing, tympanic membrane, ear canal and external ear normal. Tympanic membrane is not erythematous, retracted or bulging.     Left Ear: Hearing, tympanic membrane, ear canal and external ear normal. Tympanic membrane is not erythematous, retracted or bulging.     Nose: No mucosal edema or rhinorrhea.     Right Sinus: No maxillary sinus tenderness or frontal sinus tenderness.     Left Sinus: No maxillary sinus tenderness or frontal sinus tenderness.     Mouth/Throat:     Pharynx: Uvula midline.  Eyes:     General: Lids are normal. Lids are everted, no foreign bodies appreciated.     Conjunctiva/sclera: Conjunctivae normal.     Pupils: Pupils are equal, round, and reactive to light.  Neck:     Thyroid: No thyroid mass or thyromegaly.     Vascular: No carotid bruit.     Trachea: Trachea normal.  Cardiovascular:     Rate and Rhythm: Normal rate and regular rhythm.     Pulses: Normal pulses.     Heart sounds: S1 normal and S2 normal. Heart sounds not distant.     No friction rub. No gallop.     Comments:  Mild, R> L.. with compresison hose Pulmonary:     Effort: Pulmonary effort is normal. No tachypnea or respiratory distress.     Breath sounds: Normal breath sounds. No  decreased breath sounds, wheezing, rhonchi or rales.  Abdominal:     General: Bowel sounds are normal.     Palpations: Abdomen is soft.     Tenderness: There is no abdominal tenderness.  Musculoskeletal:     Cervical back: Normal range of motion and neck supple.     Lumbar back: Tenderness present. No swelling, spasms or bony tenderness. Decreased range of motion. Negative right straight leg raise test and negative left straight leg raise test.     Right lower leg: Edema present.     Left lower leg: Edema present.  Skin:    General: Skin is warm and dry.     Findings: No rash.  Neurological:     Mental Status: She is alert.  Psychiatric:        Mood and Affect: Mood is not anxious or depressed.        Speech: Speech normal.        Behavior: Behavior normal. Behavior is cooperative.        Thought Content: Thought content normal.        Judgment: Judgment normal.       Results for orders placed or performed in visit on 09/09/23  NMR, lipoprofile   Collection Time: 10/13/23  9:20 AM  Result Value Ref Range   LDL Particle Number 395 <1,000 nmol/L   LDL-C (NIH Calc) 31 0 - 99 mg/dL   HDL-C 56 >16 mg/dL   Triglycerides 109 (H) 0 - 149 mg/dL   Cholesterol, Total 604 100 - 199 mg/dL   HDL Particle Number 54.0 >=30.5 umol/L   Small LDL Particle Number 278 <=527 nmol/L   LDL Size 19.9 (L) >20.5 nm   LP-IR Score 67 (H) <=45    Assessment and Plan  Chronic right-sided low back pain without sciatica Assessment & Plan:  Chronic but with gradual worsening.  Eval with X-ray.  Avoid NSAIDs  Apply lidocaine patch, heat and start home PT.   Orders: -     DG Lumbar Spine Complete; Future  Essential hypertension, benign Assessment & Plan: Stable, chronic.  Continue current medication.  amlodipine 5 mg daily and losartan 25 mg daily.   Hyperlipidemia, unspecified hyperlipidemia type Assessment & Plan: No statin due to apparent side effects.  On repatha q 2 weeks per lipid  clinic.  Wished to have lipids managed here... lipid clinic agreeable... needs reauthorization for repatha.  Referred to pharmacy for assistance.  Orders: -     AMB Referral VBCI Care Management  Statin intolerance  Aortic atherosclerosis (HCC) Assessment & Plan:  On repatha.   History of CVA (cerebrovascular accident) Assessment & Plan: Left thalamus 2023  On ASA EC 81 mg daily   Migraine syndrome Assessment & Plan:  Chronic, none severe in  last  year.. followed by Dr. Neale Burly. Has milder headache 2 times per month.  Using zonegra for prevention.  Treated in past for trigger point injections.   Mild cognitive impairment Assessment & Plan:  Chronic, gradaully worsening per pt.  No issue remebering to take her med.  Gradual onset... MMSE for insurance was normal per daughter.   Mood disorder (HCC) Assessment & Plan:  Chronic, but rarely .  Rarely uses tranxene... has not used  more than 4-5 times a year.   Vitamin B12 deficiency Assessment & Plan:  On supplement.   Hiatal hernia  Gastroesophageal reflux disease without esophagitis Assessment & Plan:   Not causing issues.   Crohn's disease without complication, unspecified gastrointestinal tract location [facility]) Assessment & Plan:  Chronic, S/P  partial colectomy.   Irritable bowel syndrome with both constipation and diarrhea Assessment & Plan:  IMprove   OSTEOPENIA Assessment & Plan:  Last DEXA 2018   Chronic venous insufficiency Assessment & Plan:  Chronic, controlled with compression hose.  Controlled  with three times a week lasix.Marland Kitchen uses potassium with it.   Other orders -     Lidocaine; Place 1 patch onto the skin daily. Remove & Discard patch within 12 hours or as directed by MD  Dispense: 30 patch; Refill: 1  Return for phone AMW,  fasting labs then CPE with me.   Kerby Nora, MD

## 2024-03-08 NOTE — Assessment & Plan Note (Signed)
 Last DEXA 2018

## 2024-03-08 NOTE — Assessment & Plan Note (Addendum)
 Chronic but with gradual worsening.  Eval with X-ray.  Avoid NSAIDs  Apply lidocaine patch, heat and start home PT.

## 2024-03-08 NOTE — Assessment & Plan Note (Signed)
Not causing issues

## 2024-03-08 NOTE — Assessment & Plan Note (Addendum)
 Chronic, none severe in  last  year.. followed by Dr. Neale Burly. Has milder headache 2 times per month.  Using zonegra for prevention.  Treated in past for trigger point injections.

## 2024-03-14 ENCOUNTER — Telehealth: Payer: Self-pay

## 2024-03-14 ENCOUNTER — Encounter: Payer: Self-pay | Admitting: Family Medicine

## 2024-03-14 NOTE — Progress Notes (Signed)
 Care Guide Pharmacy Note  03/14/2024 Name: [name] MRN: [id number] DOB: [birth year]  Referred By: Excell Seltzer, MD Reason for referral: Complex Care Management (Outreach to schedule with Pharm d )   [name] is a 83 y.o. year old female who is a primary care patient of Excell Seltzer, MD.  [name] was referred to the pharmacist for assistance related to: HTN and HLD  An unsuccessful telephone outreach was attempted today to contact the patient who was referred to the pharmacy team for assistance with medication assistance. Additional attempts will be made to contact the patient.  Penne Lash , RMA     [facility]  [facility] Guide  Direct Dial: [phone]  Website: [email or web address]

## 2024-03-14 NOTE — Progress Notes (Signed)
 Care Guide Pharmacy Note  03/14/2024 Name: [name] MRN: [id number] DOB: November 13, 1941  Referred By: Excell Seltzer, MD Reason for referral: Complex Care Management (Outreach to schedule with Pharm d )   [name] is a 83 y.o. year old female who is a primary care patient of Excell Seltzer, MD.  [name] was referred to the pharmacist for assistance related to: HTN and HLD  Successful contact was made with the patient to discuss pharmacy services including being ready for the pharmacist to call at least 5 minutes before the scheduled appointment time and to have medication bottles and any blood pressure readings ready for review. The patient agreed to meet with the pharmacist via telephone visit on (date/time).03/16/2024  Penne Lash , RMA     [employer] [employer]  [facility], [facility] Guide  Direct Dial: [phone]  Website: [email or web address]

## 2024-03-16 ENCOUNTER — Other Ambulatory Visit (INDEPENDENT_AMBULATORY_CARE_PROVIDER_SITE_OTHER): Admitting: Pharmacist

## 2024-03-16 DIAGNOSIS — I700 Atherosclerosis of aorta: Secondary | ICD-10-CM

## 2024-03-16 DIAGNOSIS — E785 Hyperlipidemia, unspecified: Secondary | ICD-10-CM

## 2024-03-16 MED ORDER — REPATHA SURECLICK 140 MG/ML ~~LOC~~ SOAJ: 140.0000 mg | SUBCUTANEOUS | 1 refills | Status: DC

## 2024-03-16 NOTE — Patient Instructions (Signed)
 Ms. [name],   It was a pleasure to speak with you today! As we discussed:?   Continue your Repatha as you have been taking it.  Based on what I am seeing, it should continue to be covered at your pharmacy even with the doctor switching to Dr. Ermalene Searing.  You have been enrolled in the [employer]Well Foundation Hyperlipidemia [name].  This grant helps Medicare patients pay for their cholesterol medications at the pharmacy.   Your HealthWell ID: [id number]  HealthWell Foundation: Website: https://www.healthwellfoundation.[email or web address]/fund/hypercholesterolemia-[email or web address]-access/ Phone: [phone] M-F 9am-5pm   This program works similar to a Biochemist, clinical card or insurance card. Your pharmacy will continue to bill your cholesterol medicine through Medicare, then they will bill the HealthWell savings card to cover your copay.   Your HealthWell savings card is electronic (not a physical card).  I have already provided the following information to your [facility] pharmacist:   Your HealthWell Savings Card Numbers: [employer] - Re-enrollment 2025              Rx Card: Card No.  [id number] RX BIN:  [id number] PCN:  [id number] Group:  [id number]   Start Date: 02/15/2024 End Date: 02/13/2025    Please reach out prior to your next scheduled appointment should you have any questions or concerns.  You may respond directly to this message, or leave me a voicemail at [phone] and I will get back to you shortly.   Thank you!   Future Appointments  Date Time Provider Department Center  08/30/2024  8:30 AM [facility]  09/05/2024  3:40 PM [facility] ANNUAL WELLNESS VISIT 1 [facility]  09/06/2024 10:20 AM Excell Seltzer, MD [facility]  02/21/2025  1:30 PM Willeen Niece, MD [facility] None   Loree Fee, PharmD Clinical Pharmacist [facility] [phone]

## 2024-03-16 NOTE — Progress Notes (Unsigned)
   03/16/2024 Name: ALIZ MERITT MRN: [id number] DOB: 1941-01-21  Subjective  Chief Complaint  Patient presents with   Hyperlipidemia   Medication Access   Care Team: Primary Care Provider: Excell Seltzer, MD  Reason for visit: ?  [name] is a 83 y.o. female who presents today for a telephone visit with the pharmacist to review coverage of Repatha.   Medication Access: ?  Prescription drug coverage: YES Environmental consultant Advantage (HMO) 1 month supply at [employer]   Current Patient Assistance:  N/A  Previously seen by lipid clinic who prescribed Repatha. Is currently taking Repatha though would like to start filling this through primary care as it is closer to her home (which she has already discussed with lipid clinic).  Last Repatha fill: 03/05/24 - reports having to meet her deductible then has a copay thereafter. Denies previous enrollment for medication assistance  Patient lives in a household of 2 with an estimated annual income <500% [employer]PL. Both husband and patient receive social security and have both started drawing from retirement account.   Medicare LIS Eligible: No Savings/retirement account  Assessment and Plan:   1. Medication Access Repatha Prior authorization complete: Key: [id number] Outcome: Prior authorization not needed. previously approved [id number] Patient reports cost is manageable though has a deductible each year and a higher copay on repatha compared to her other (generic) medications.  Patient has Medicare Part D and an estimated household income <500% [employer]PL and therefore is likely eligible for the [employer] Hyperlipidemia Grant. Assisted patient with enrollment the the [employer] Hypercholesterolemia Medicare Access grant for 2025. Patient program details outlined below:   [employer] - Re-enrollment 2025   Medication(s): All cholesterol medications (prescribed Repatha)     Application Status:  Approved for initial enrollment    HealthWell ID: [id number] Fund: Hypercholesterolemia - Medicare Access Assistance Type: Co-pay Start Date: 02/15/2024 End Date: 02/13/2025               Rx Card: Card No.  [id number] RX BIN:  [id number] PCN:  [id number] Group:  [id number]   Future Appointments  Date Time Provider Department Center  08/30/2024  8:30 AM [facility] LAB [facility]  09/05/2024  3:40 PM [facility] ANNUAL WELLNESS VISIT 1 [facility]  09/06/2024 10:20 AM Excell Seltzer, MD [facility]  02/21/2025  1:30 PM Willeen Niece, MD [facility] None    Loree Fee, PharmD Clinical Pharmacist [facility] [phone]

## 2024-04-19 ENCOUNTER — Ambulatory Visit (INDEPENDENT_AMBULATORY_CARE_PROVIDER_SITE_OTHER)
Admission: RE | Admit: 2024-04-19 | Discharge: 2024-04-19 | Disposition: A | Discharge status: Home / Self Care | Source: Ambulatory Visit | Attending: Family Medicine | Admitting: Family Medicine

## 2024-04-19 ENCOUNTER — Encounter: Payer: Self-pay | Admitting: Family Medicine

## 2024-04-19 ENCOUNTER — Ambulatory Visit (INDEPENDENT_AMBULATORY_CARE_PROVIDER_SITE_OTHER): Admitting: Family Medicine

## 2024-04-19 ENCOUNTER — Ambulatory Visit: Payer: Self-pay | Admitting: Family Medicine

## 2024-04-19 VITALS — BP 100/60 | HR 83 | Temp 98.3°F | Ht 61.5 in | Wt 110.4 lb

## 2024-04-19 DIAGNOSIS — R509 Fever, unspecified: Secondary | ICD-10-CM | POA: Diagnosis not present

## 2024-04-19 DIAGNOSIS — R0609 Other forms of dyspnea: Secondary | ICD-10-CM | POA: Insufficient documentation

## 2024-04-19 DIAGNOSIS — R0602 Shortness of breath: Secondary | ICD-10-CM | POA: Diagnosis not present

## 2024-04-19 DIAGNOSIS — R5383 Other fatigue: Secondary | ICD-10-CM | POA: Diagnosis not present

## 2024-04-19 LAB — POC URINALSYSI DIPSTICK (AUTOMATED)
Bilirubin, UA: NEGATIVE
Blood, UA: NEGATIVE
Glucose, UA: NEGATIVE
Ketones, UA: NEGATIVE
Leukocytes, UA: NEGATIVE
Nitrite, UA: NEGATIVE
Protein, UA: NEGATIVE
Spec Grav, UA: <=1.005 — AB (ref 1.010–1.025)
Urobilinogen, UA: 0.2 U/dL
pH, UA: 6 (ref 5.0–8.0)

## 2024-04-19 LAB — CBC WITH DIFFERENTIAL/PLATELET
Basophils Absolute: 0 10*3/uL (ref 0.0–0.1)
Basophils Relative: 0.7 % (ref 0.0–3.0)
Eosinophils Absolute: 0 10*3/uL (ref 0.0–0.7)
Eosinophils Relative: 1.1 % (ref 0.0–5.0)
HCT: 37.8 % (ref 36.0–46.0)
Hemoglobin: 12.6 g/dL (ref 12.0–15.0)
Lymphocytes Relative: 33.2 % (ref 12.0–46.0)
Lymphs Abs: 1.3 10*3/uL (ref 0.7–4.0)
MCHC: 33.3 g/dL (ref 30.0–36.0)
MCV: 90.8 fl (ref 78.0–100.0)
Monocytes Absolute: 0.4 10*3/uL (ref 0.1–1.0)
Monocytes Relative: 11 % (ref 3.0–12.0)
Neutro Abs: 2.1 10*3/uL (ref 1.4–7.7)
Neutrophils Relative %: 54 % (ref 43.0–77.0)
Platelets: 163 10*3/uL (ref 150.0–400.0)
RBC: 4.16 Mil/uL (ref 3.87–5.11)
RDW: 13.2 % (ref 11.5–15.5)
WBC: 3.9 10*3/uL — ABNORMAL LOW (ref 4.0–10.5)

## 2024-04-19 LAB — COMPREHENSIVE METABOLIC PANEL WITH GFR
ALT: 18 U/L (ref 0–35)
AST: 23 U/L (ref 0–37)
Albumin: 4 g/dL (ref 3.5–5.2)
Alkaline Phosphatase: 76 U/L (ref 39–117)
BUN: 8 mg/dL (ref 6–23)
CO2: 24 meq/L (ref 19–32)
Calcium: 8.8 mg/dL (ref 8.4–10.5)
Chloride: 103 meq/L (ref 96–112)
Creatinine, Ser: 0.96 mg/dL (ref 0.40–1.20)
GFR: 55.26 mL/min — ABNORMAL LOW (ref >60.00)
Glucose, Bld: 99 mg/dL (ref 70–99)
Potassium: 3.6 meq/L (ref 3.5–5.1)
Sodium: 136 meq/L (ref 135–145)
Total Bilirubin: 0.4 mg/dL (ref 0.2–1.2)
Total Protein: 5.9 g/dL — ABNORMAL LOW (ref 6.0–8.3)

## 2024-04-19 LAB — TSH: TSH: 4.98 u[IU]/mL (ref 0.35–5.50)

## 2024-04-19 NOTE — Assessment & Plan Note (Addendum)
 Acute, fever of unknown origin No focal symptoms other than gradually worsening shortness of breath. No urinary symptoms or GI changes.  No skin rash.  No URI symptoms. Urinalysis clear in office today. Will evaluate with chest x-ray given shortness of breath as well as with CBC, complete metabolic panel and thyroid  testing for causes of elevated temperature and severe fatigue.

## 2024-04-19 NOTE — Progress Notes (Signed)
 Patient ID: [name], female    DOB: 1941-10-26, 83 y.o.   MRN: [id number]  This visit was conducted in person.  BP 100/60   Pulse 83   Temp 98.3 F (36.8 C) (Temporal)   Ht 5' 1.5" (1.562 m)   Wt 110 lb 6 oz (50.1 kg)   SpO2 99%   BMI 20.52 kg/m    CC:  Chief Complaint  Patient presents with   Fever   Back Pain   Fatigue    Subjective:   HPI: [name] is a 83 y.o. female  with history of Crohns, HTN, vaginal prolapse presenting on 04/19/2024 for Fever, Back Pain, and Fatigue  New onset fatigue, shortness of breath. Starting feeling bad last week.. fatigued,  No cough, mild congestion, no ear pain, no face pain.  No N/V/D. No new rash.  No dysuria, no frequency , no urgency, no change, occ dribbling.  Has had ongoing  SOB, chronically.  No dental issues.  Had fever yesterday 100F  Using ibuprofen as needed.   Using lidocaine  patches on  chronic low back.. this has helped with pain.  Has this issue off and on for a while.   Weight between 111--112 Wt Readings from Last 3 Encounters:  04/19/24 110 lb 6 oz (50.1 kg)  03/08/24 113 lb (51.3 kg)  10/21/23 119 lb 14.4 oz (54.4 kg)      No new medication.  Relevant past medical, surgical, family and social history reviewed and updated as indicated. Interim medical history since our last visit reviewed. Allergies and medications reviewed and updated. Outpatient Medications Prior to Visit  Medication Sig Dispense Refill   amLODipine  (NORVASC ) 5 MG tablet Take 1 tablet (5 mg total) by mouth daily. 90 tablet 3   aspirin  EC 81 MG EC tablet Take 1 tablet (81 mg total) by mouth daily. Swallow whole. 30 tablet 11   baclofen (LIORESAL) 10 MG tablet Take 10 mg by mouth daily as needed. For headache     carbamide peroxide (DEBROX) 6.5 % OTIC solution Place 5 drops into the right ear daily as needed (for wax build up).     clorazepate  (TRANXENE ) 7.5 MG tablet Take 1 tablet by mouth twice daily as needed for  anxiety 20 tablet 0   Evolocumab  (REPATHA  SURECLICK) 140 MG/ML SOAJ Inject 140 mg into the skin every 14 (fourteen) days. Please bill secondary  -  ID: [id number],   BIN: [id number],    PCN: [id number],  Group: [id number] 6 mL 1   furosemide  (LASIX ) 40 MG tablet TAKE 1 TABLET BY MOUTH ONCE DAILY. HOLD IF NO AM LEG SWELLING 90 tablet 0   lidocaine  (LIDODERM ) 5 % Place 1 patch onto the skin daily. Remove & Discard patch within 12 hours or as directed by MD 30 patch 1   losartan  (COZAAR ) 25 MG tablet Take 1 tablet by mouth once daily 90 tablet 1   mometasone  (ELOCON ) 0.1 % cream Apply topically 2 (two) times daily as needed.     Multiple Vitamin (MULTIVITAMIN) tablet Take 1 tablet by mouth daily.     Potassium Gluconate 550 MG TABS Take 1,100 mg by mouth daily.     Probiotic Product (PROBIOTIC PO) Take 1 capsule by mouth daily.     triamcinolone  ointment (KENALOG ) 0.1 % Apply three times weekly, increasing to daily for flares as needed 30 g 4   vitamin B-12 (CYANOCOBALAMIN ) 1000 MCG tablet Take 1,000 mcg by mouth daily.  zonisamide (ZONEGRAN) 100 MG capsule Take 100 mg by mouth daily.     No facility-administered medications prior to visit.     Per HPI unless specifically indicated in ROS section below Review of Systems  Constitutional:  Positive for fever. Negative for fatigue.  HENT:  Negative for congestion.   Eyes:  Negative for pain.  Respiratory:  Positive for shortness of breath. Negative for cough.   Cardiovascular:  Negative for chest pain, palpitations and leg swelling.  Gastrointestinal:  Negative for abdominal pain.  Genitourinary:  Negative for dysuria and vaginal bleeding.  Musculoskeletal:  Positive for back pain.  Neurological:  Negative for syncope, light-headedness and headaches.  Psychiatric/Behavioral:  Negative for dysphoric mood.   Acute, Objective:  BP 100/60   Pulse 83   Temp 98.3 F (36.8 C) (Temporal)   Ht 5' 1.5" (1.562 m)   Wt 110 lb 6 oz (50.1 kg)   SpO2 99%    BMI 20.52 kg/m   Wt Readings from Last 3 Encounters:  04/19/24 110 lb 6 oz (50.1 kg)  03/08/24 113 lb (51.3 kg)  10/21/23 119 lb 14.4 oz (54.4 kg)      Physical Exam Constitutional:      General: She is not in acute distress.    Appearance: Normal appearance. She is well-developed. She is not ill-appearing or toxic-appearing.  HENT:     Head: Normocephalic.     Right Ear: Hearing, tympanic membrane, ear canal and external ear normal. Tympanic membrane is not erythematous, retracted or bulging.     Left Ear: Hearing, tympanic membrane, ear canal and external ear normal. Tympanic membrane is not erythematous, retracted or bulging.     Nose: No mucosal edema or rhinorrhea.     Right Sinus: No maxillary sinus tenderness or frontal sinus tenderness.     Left Sinus: No maxillary sinus tenderness or frontal sinus tenderness.     Mouth/Throat:     Pharynx: Uvula midline.  Eyes:     General: Lids are normal. Lids are everted, no foreign bodies appreciated.     Conjunctiva/sclera: Conjunctivae normal.     Pupils: Pupils are equal, round, and reactive to light.  Neck:     Thyroid : No thyroid  mass or thyromegaly.     Vascular: No carotid bruit.     Trachea: Trachea normal.  Cardiovascular:     Rate and Rhythm: Normal rate and regular rhythm.     Pulses: Normal pulses.     Heart sounds: Normal heart sounds, S1 normal and S2 normal. No murmur heard.    No friction rub. No gallop.  Pulmonary:     Effort: Pulmonary effort is normal. No tachypnea or respiratory distress.     Breath sounds: Normal breath sounds. No decreased breath sounds, wheezing, rhonchi or rales.  Abdominal:     General: Bowel sounds are normal.     Palpations: Abdomen is soft.     Tenderness: There is no abdominal tenderness.  Musculoskeletal:     Cervical back: Normal range of motion and neck supple.  Skin:    General: Skin is warm and dry.     Findings: No rash.  Neurological:     Mental Status: She is alert.   Psychiatric:        Mood and Affect: Mood is not anxious or depressed.        Speech: Speech normal.        Behavior: Behavior normal. Behavior is cooperative.  Thought Content: Thought content normal.        Judgment: Judgment normal.       Results for orders placed or performed in visit on 04/19/24  POCT Urinalysis Dipstick (Automated)   Collection Time: 04/19/24 10:46 AM  Result Value Ref Range   Color, UA Yellow    Clarity, UA Clear    Glucose, UA Negative Negative   Bilirubin, UA Negative    Ketones, UA Negative    Spec Grav, UA <=1.005 (A) 1.010 - 1.025   Blood, UA Negative    pH, UA 6.0 5.0 - 8.0   Protein, UA Negative Negative   Urobilinogen, UA 0.2 0.2 or 1.0 E.U./dL   Nitrite, UA Negative    Leukocytes, UA Negative Negative    Assessment and Plan  Fever, unspecified Assessment & Plan: Acute, fever of unknown origin No focal symptoms other than gradually worsening shortness of breath. No urinary symptoms or GI changes.  No skin rash.  No URI symptoms. Urinalysis clear in office today. Will evaluate with chest x-ray given shortness of breath as well as with CBC, complete metabolic panel and thyroid  testing for causes of elevated temperature and severe fatigue.   Orders: -     POCT Urinalysis Dipstick (Automated) -     CBC with Differential/Platelet -     Comprehensive metabolic panel with GFR -     TSH  SOB (shortness of breath) Assessment & Plan: Lungs clear to auscultation bilaterally  Orders: -     DG Chest 2 View; Future    No follow-ups on file.   Herby Lolling, MD

## 2024-04-19 NOTE — Assessment & Plan Note (Signed)
Lungs clear to auscultation bilaterally. 

## 2024-04-27 ENCOUNTER — Other Ambulatory Visit: Payer: Self-pay | Admitting: Internal Medicine

## 2024-05-10 ENCOUNTER — Other Ambulatory Visit: Payer: Self-pay | Admitting: Internal Medicine

## 2024-05-19 ENCOUNTER — Encounter: Payer: Self-pay | Admitting: Family Medicine

## 2024-05-19 ENCOUNTER — Ambulatory Visit (INDEPENDENT_AMBULATORY_CARE_PROVIDER_SITE_OTHER): Admitting: Family Medicine

## 2024-05-19 VITALS — BP 100/60 | HR 89 | Temp 98.0°F | Ht 61.5 in | Wt 111.0 lb

## 2024-05-19 DIAGNOSIS — L239 Allergic contact dermatitis, unspecified cause: Secondary | ICD-10-CM

## 2024-05-19 MED ORDER — TRIAMCINOLONE ACETONIDE 0.1 % EX CREA: 1.0000 | TOPICAL_CREAM | Freq: Two times a day (BID) | CUTANEOUS | 0 refills | Status: AC

## 2024-05-19 MED ORDER — PREDNISONE 20 MG PO TABS
ORAL_TABLET | ORAL | 0 refills | Status: DC
Start: 2024-05-19 — End: 2024-06-09

## 2024-05-19 NOTE — Progress Notes (Signed)
 Olivia Merriott [facility], MD, CAQ Sports Medicine [facility] [address] [place], [address]  Phone: [phone]  FAX: [phone]  [name] - 83 y.o. female  MRN [id number]  Date of Birth: [birth year]  Date: 05/19/2024  PCP: Judithann Novas, MD  Referral: Judithann Novas, MD  Chief Complaint  Patient presents with   Rash   Subjective:   [name] is a 83 y.o. very pleasant female patient with Body mass index is 20.63 kg/m. who presents with the following:  The patient reports that she has had a developing rash over the last few days that has now spread to between her legs or abdomen her back or thorax or arms, is extremely pruritic.  No new soaps, lotions, detergents, shampoo, medications, or any kind of herbal remedies.  She has been doing a lot of yard work.  Rash all over Red splotches Working in the yard    Review of Systems is noted in the HPI, as appropriate  Objective:   BP 100/60   Pulse 89   Temp 98 F (36.7 C) (Temporal)   Ht 5' 1.5 (1.562 m)   Wt 111 lb (50.3 kg)   SpO2 98%   BMI 20.63 kg/m   GEN: No acute distress; alert,appropriate. PULM: Breathing comfortably in no respiratory distress PSYCH: Normally interactive.   Scattered patches slightly elevated, mildly red and not warm.  These are diffuse throughout the legs, abdomen, thorax, back, arms.  There is no vesicular formation and no scale  Laboratory and Imaging Data:  Assessment and Plan:     ICD-10-CM   1. Allergic dermatitis  L23.9      She is having some type of fairly global allergic response.  Possibly due to yardwork implant exposure, however it does not have the typical appearance of a poison ivy or poison oak outbreak.  I am going to give her some triamcinolone  and oral steroids.  Medication Management during today's office visit: Meds ordered this encounter  Medications   predniSONE  (DELTASONE ) 20 MG tablet    Sig: 2  tabs po daily for 5 days, then 1 tab po daily for 5 days    Dispense:  15 tablet    Refill:  0   triamcinolone  cream (KENALOG ) 0.1 %    Sig: Apply 1 Application topically 2 (two) times daily.    Dispense:  454 g    Refill:  0   There are no discontinued medications.  Orders placed today for conditions managed today: No orders of the defined types were placed in this encounter.   Disposition: No follow-ups on file.  [employer] One speech-to-text software was used for transcription in this dictation.  Possible transcriptional errors can occur using Animal nutritionist.   Signed,  Ranny Bye. Yonna Alwin, MD   Outpatient Encounter Medications as of 05/19/2024  Medication Sig   amLODipine  (NORVASC ) 5 MG tablet Take 1 tablet (5 mg total) by mouth daily.   aspirin  EC 81 MG EC tablet Take 1 tablet (81 mg total) by mouth daily. Swallow whole.   baclofen (LIORESAL) 10 MG tablet Take 10 mg by mouth daily as needed. For headache   carbamide peroxide (DEBROX) 6.5 % OTIC solution Place 5 drops into the right ear daily as needed (for wax build up).   clorazepate  (TRANXENE ) 7.5 MG tablet Take 1 tablet by mouth twice daily as needed for anxiety   Evolocumab  (REPATHA  SURECLICK) 140 MG/ML SOAJ  Inject 140 mg into the skin every 14 (fourteen) days. Please bill secondary  -  ID: [id number],   BIN: [id number],    PCN: [id number],  Group: [id number]   furosemide  (LASIX ) 40 MG tablet TAKE 1 TABLET BY MOUTH ONCE DAILY HOLD  IF  NO  AM  LEG  SWELLING   lidocaine  (LIDODERM ) 5 % Place 1 patch onto the skin daily. Remove & Discard patch within 12 hours or as directed by MD   losartan  (COZAAR ) 25 MG tablet Take 1 tablet by mouth once daily   mometasone  (ELOCON ) 0.1 % cream Apply topically 2 (two) times daily as needed.   Multiple Vitamin (MULTIVITAMIN) tablet Take 1 tablet by mouth daily.   Potassium Gluconate 550 MG TABS Take 1,100 mg by mouth daily.   predniSONE  (DELTASONE ) 20 MG tablet 2 tabs po daily for 5 days, then 1  tab po daily for 5 days   Probiotic Product (PROBIOTIC PO) Take 1 capsule by mouth daily.   triamcinolone  cream (KENALOG ) 0.1 % Apply 1 Application topically 2 (two) times daily.   triamcinolone  ointment (KENALOG ) 0.1 % Apply three times weekly, increasing to daily for flares as needed   vitamin B-12 (CYANOCOBALAMIN ) 1000 MCG tablet Take 1,000 mcg by mouth daily.   zonisamide (ZONEGRAN) 100 MG capsule Take 100 mg by mouth daily.   No facility-administered encounter medications on file as of 05/19/2024.

## 2024-06-03 ENCOUNTER — Other Ambulatory Visit: Payer: Self-pay | Admitting: Family Medicine

## 2024-06-03 ENCOUNTER — Encounter: Payer: Self-pay | Admitting: Family Medicine

## 2024-06-03 DIAGNOSIS — R0602 Shortness of breath: Secondary | ICD-10-CM

## 2024-06-03 DIAGNOSIS — R5383 Other fatigue: Secondary | ICD-10-CM

## 2024-06-03 NOTE — Progress Notes (Signed)
 Referral placed.

## 2024-06-09 ENCOUNTER — Encounter: Payer: Self-pay | Admitting: Family Medicine

## 2024-06-09 ENCOUNTER — Ambulatory Visit (INDEPENDENT_AMBULATORY_CARE_PROVIDER_SITE_OTHER): Admitting: Family Medicine

## 2024-06-09 VITALS — BP 110/60 | HR 79 | Temp 99.1°F | Ht 61.5 in | Wt 111.2 lb

## 2024-06-09 DIAGNOSIS — R0609 Other forms of dyspnea: Secondary | ICD-10-CM | POA: Diagnosis not present

## 2024-06-09 DIAGNOSIS — R55 Syncope and collapse: Secondary | ICD-10-CM | POA: Insufficient documentation

## 2024-06-09 NOTE — Assessment & Plan Note (Signed)
 Acute, recurrent.  Normal neurologic exam.  Given associated dyspnea on exertion, decreased endurance and lightheadedness I am most concerned for cardiac cause of syncope. EKG was unchanged from previous with normal sinus rhythm and left bundle branch block. CBC, complete metabolic panel and thyroid  are within normal limits at last check. Given new murmur on exam I will move forward with an echocardiogram to evaluate dyspnea on exertion. I will go ahead and move forward with referral to cardiology for further evaluation.

## 2024-06-09 NOTE — Progress Notes (Signed)
 Patient ID: [name], female    DOB: [birth year], 83 y.o.   MRN: [id number]  This visit was conducted in person.  BP 110/60   Pulse 79   Temp 99.1 F (37.3 C) (Temporal)   Ht 5' 1.5 (1.562 m)   Wt 111 lb 4 oz (50.5 kg)   SpO2 93%   BMI 20.68 kg/m    CC:  Chief Complaint  Patient presents with   Fatigue   Shortness of Breath         Subjective:   HPI: [name] is a 83 y.o. female presenting on 06/09/2024 for Fatigue and Shortness of Breath (/)  Daughter Joen with her at appt.  She was seen in May of this year with fever and associated fatigue and shortness of breath.  At that point it was felt to be a viral infection but her fatigue and shortness of breath have not improved.  She has had resolution of fever.  She feels she has been fatigued for  quite a while.  She has no endurance anymore. DOE, no SOB at rest. SOb with folding laundry, going to [employer].  No Chest pain with exertion.  No wheeze, no cough.   Slight increase in peripheral edema.. has chronic swelling in rightlower leg.  No palpitations.   Has to nap a lot.  Sleeping well at night, wakes rested as day goes on she feels more and more tired.    She has had several episodes when working outside of presyncope, weakness and lighthededness.  IN spring did have an episode of after hot shower got out and collapse, no LOC  CBC, TSH and CMET  CXR no clear cardio pulmonary disease seen.   No new medication.  Has been on zponegran for a few years for migraine. No dose changne.  Using baclofen off and on.   ECHO: 60-65%. Mild LVH, mild  diastolic dysfunction, no valve issues Has history of Mini stroke.  Husband sees Dr, Jeffrie   Relevant past medical, surgical, family and social history reviewed and updated as indicated. Interim medical history since our last visit reviewed. Allergies and medications reviewed and updated. Outpatient Medications Prior to Visit  Medication Sig Dispense Refill    amLODipine  (NORVASC ) 5 MG tablet Take 1 tablet (5 mg total) by mouth daily. 90 tablet 3   aspirin  EC 81 MG EC tablet Take 1 tablet (81 mg total) by mouth daily. Swallow whole. 30 tablet 11   baclofen (LIORESAL) 10 MG tablet Take 10 mg by mouth daily as needed. For headache     carbamide peroxide (DEBROX) 6.5 % OTIC solution Place 5 drops into the right ear daily as needed (for wax build up).     clorazepate  (TRANXENE ) 7.5 MG tablet Take 1 tablet by mouth twice daily as needed for anxiety 20 tablet 0   Evolocumab  (REPATHA  SURECLICK) 140 MG/ML SOAJ Inject 140 mg into the skin every 14 (fourteen) days. Please bill secondary  -  ID: [id number],   BIN: [id number],    PCN: [id number],  Group: [id number] 6 mL 1   furosemide  (LASIX ) 40 MG tablet TAKE 1 TABLET BY MOUTH ONCE DAILY HOLD  IF  NO  AM  LEG  SWELLING 90 tablet 0   lidocaine  (LIDODERM ) 5 % Place 1 patch onto the skin daily. Remove & Discard patch within 12 hours or as directed by MD 30 patch 1   losartan  (COZAAR ) 25 MG tablet Take 1 tablet by mouth once  daily 90 tablet 0   mometasone  (ELOCON ) 0.1 % cream Apply topically 2 (two) times daily as needed.     Multiple Vitamin (MULTIVITAMIN) tablet Take 1 tablet by mouth daily.     Potassium Gluconate 550 MG TABS Take 1,100 mg by mouth daily.     Probiotic Product (PROBIOTIC PO) Take 1 capsule by mouth daily.     triamcinolone  cream (KENALOG ) 0.1 % Apply 1 Application topically 2 (two) times daily. 454 g 0   triamcinolone  ointment (KENALOG ) 0.1 % Apply three times weekly, increasing to daily for flares as needed 30 g 4   vitamin B-12 (CYANOCOBALAMIN ) 1000 MCG tablet Take 1,000 mcg by mouth daily.     zonisamide (ZONEGRAN) 100 MG capsule Take 100 mg by mouth daily.     predniSONE  (DELTASONE ) 20 MG tablet 2 tabs po daily for 5 days, then 1 tab po daily for 5 days 15 tablet 0   No facility-administered medications prior to visit.     Per HPI unless specifically indicated in ROS section below Review of  Systems  Constitutional:  Negative for fatigue and fever.  HENT:  Negative for congestion.   Eyes:  Negative for pain.  Respiratory:  Negative for cough and shortness of breath.   Cardiovascular:  Negative for chest pain, palpitations and leg swelling.  Gastrointestinal:  Negative for abdominal pain.  Genitourinary:  Negative for dysuria and vaginal bleeding.  Musculoskeletal:  Negative for back pain.  Neurological:  Negative for syncope, light-headedness and headaches.  Psychiatric/Behavioral:  Negative for dysphoric mood.    Objective:  BP 110/60   Pulse 79   Temp 99.1 F (37.3 C) (Temporal)   Ht 5' 1.5 (1.562 m)   Wt 111 lb 4 oz (50.5 kg)   SpO2 93%   BMI 20.68 kg/m   Wt Readings from Last 3 Encounters:  06/09/24 111 lb 4 oz (50.5 kg)  05/19/24 111 lb (50.3 kg)  04/19/24 110 lb 6 oz (50.1 kg)      Physical Exam Constitutional:      General: She is not in acute distress.    Appearance: Normal appearance. She is well-developed. She is not ill-appearing or toxic-appearing.  HENT:     Head: Normocephalic.     Right Ear: Hearing, tympanic membrane, ear canal and external ear normal. Tympanic membrane is not erythematous, retracted or bulging.     Left Ear: Hearing, tympanic membrane, ear canal and external ear normal. Tympanic membrane is not erythematous, retracted or bulging.     Nose: No mucosal edema or rhinorrhea.     Right Sinus: No maxillary sinus tenderness or frontal sinus tenderness.     Left Sinus: No maxillary sinus tenderness or frontal sinus tenderness.     Mouth/Throat:     Pharynx: Uvula midline.  Eyes:     General: Lids are normal. Lids are everted, no foreign bodies appreciated.     Conjunctiva/sclera: Conjunctivae normal.     Pupils: Pupils are equal, round, and reactive to light.  Neck:     Thyroid : No thyroid  mass or thyromegaly.     Vascular: No carotid bruit.     Trachea: Trachea normal.  Cardiovascular:     Rate and Rhythm: Normal rate and  regular rhythm.     Pulses: Normal pulses.     Heart sounds: S1 normal and S2 normal. Murmur heard.     Systolic murmur is present with a grade of 1/6.     No friction rub. No gallop.  Pulmonary:     Effort: Pulmonary effort is normal. No tachypnea or respiratory distress.     Breath sounds: Normal breath sounds. No decreased breath sounds, wheezing, rhonchi or rales.  Abdominal:     General: Bowel sounds are normal.     Palpations: Abdomen is soft.     Tenderness: There is no abdominal tenderness.  Musculoskeletal:     Cervical back: Normal range of motion and neck supple.     Right lower leg: 1+ Pitting Edema present.     Left lower leg: 1+ Pitting Edema present.  Skin:    General: Skin is warm and dry.     Findings: No rash.  Neurological:     Mental Status: She is alert and oriented to person, place, and time.     GCS: GCS eye subscore is 4. GCS verbal subscore is 5. GCS motor subscore is 6.     Cranial Nerves: No cranial nerve deficit.     Sensory: No sensory deficit.     Motor: No abnormal muscle tone.     Coordination: Coordination normal.     Gait: Gait normal.     Deep Tendon Reflexes: Reflexes are normal and symmetric.     Comments: Nml cerebellar exam   No papilledema  Psychiatric:        Mood and Affect: Mood is not anxious or depressed.        Speech: Speech normal.        Behavior: Behavior normal. Behavior is cooperative.        Thought Content: Thought content normal.        Cognition and Memory: Memory is not impaired. She does not exhibit impaired recent memory or impaired remote memory.        Judgment: Judgment normal.       Results for orders placed or performed in visit on 04/19/24  POCT Urinalysis Dipstick (Automated)   Collection Time: 04/19/24 10:46 AM  Result Value Ref Range   Color, UA Yellow    Clarity, UA Clear    Glucose, UA Negative Negative   Bilirubin, UA Negative    Ketones, UA Negative    Spec Grav, UA <=1.005 (A) 1.010 - 1.025    Blood, UA Negative    pH, UA 6.0 5.0 - 8.0   Protein, UA Negative Negative   Urobilinogen, UA 0.2 0.2 or 1.0 E.U./dL   Nitrite, UA Negative    Leukocytes, UA Negative Negative  CBC with Differential/Platelet   Collection Time: 04/19/24 11:15 AM  Result Value Ref Range   WBC 3.9 (L) 4.0 - 10.5 K/uL   RBC 4.16 3.87 - 5.11 Mil/uL   Hemoglobin 12.6 12.0 - 15.0 g/dL   HCT 62.1 63.9 - 53.9 %   MCV 90.8 78.0 - 100.0 fl   MCHC 33.3 30.0 - 36.0 g/dL   RDW 86.7 88.4 - 84.4 %   Platelets 163.0 150.0 - 400.0 K/uL   Neutrophils Relative % 54.0 43.0 - 77.0 %   Lymphocytes Relative 33.2 12.0 - 46.0 %   Monocytes Relative 11.0 3.0 - 12.0 %   Eosinophils Relative 1.1 0.0 - 5.0 %   Basophils Relative 0.7 0.0 - 3.0 %   Neutro Abs 2.1 1.4 - 7.7 K/uL   Lymphs Abs 1.3 0.7 - 4.0 K/uL   Monocytes Absolute 0.4 0.1 - 1.0 K/uL   Eosinophils Absolute 0.0 0.0 - 0.7 K/uL   Basophils Absolute 0.0 0.0 - 0.1 K/uL  Comprehensive metabolic panel with GFR  Collection Time: 04/19/24 11:15 AM  Result Value Ref Range   Sodium 136 135 - 145 mEq/L   Potassium 3.6 3.5 - 5.1 mEq/L   Chloride 103 96 - 112 mEq/L   CO2 24 19 - 32 mEq/L   Glucose, Bld 99 70 - 99 mg/dL   BUN 8 6 - 23 mg/dL   Creatinine, Ser 9.03 0.40 - 1.20 mg/dL   Total Bilirubin 0.4 0.2 - 1.2 mg/dL   Alkaline Phosphatase 76 39 - 117 U/L   AST 23 0 - 37 U/L   ALT 18 0 - 35 U/L   Total Protein 5.9 (L) 6.0 - 8.3 g/dL   Albumin 4.0 3.5 - 5.2 g/dL   GFR 44.73 (L) >39.99 mL/min   Calcium  8.8 8.4 - 10.5 mg/dL  TSH   Collection Time: 04/19/24 11:15 AM  Result Value Ref Range   TSH 4.98 0.35 - 5.50 uIU/mL    EKG: unchangesd from 01/2023 LBBB, no ST change, no LVH, no Q  Assessment and Plan  Syncope, unspecified syncope type Assessment & Plan: Acute, recurrent.  Normal neurologic exam.  Given associated dyspnea on exertion, decreased endurance and lightheadedness I am most concerned for cardiac cause of syncope. EKG was unchanged from previous with  normal sinus rhythm and left bundle branch block. CBC, complete metabolic panel and thyroid  are within normal limits at last check. Given new murmur on exam I will move forward with an echocardiogram to evaluate dyspnea on exertion. I will go ahead and move forward with referral to cardiology for further evaluation.  Orders: -     EKG 12-Lead -     ECHOCARDIOGRAM COMPLETE; Future -     Ambulatory referral to Cardiology  DOE (dyspnea on exertion) -     ECHOCARDIOGRAM COMPLETE; Future -     Ambulatory referral to Cardiology    No follow-ups on file.   Greig Ring, MD

## 2024-06-09 NOTE — Patient Instructions (Addendum)
 Decrease amlodipine  to 2.5 mg daily. Follow BP at home.SABRA goal < 140/90. Conitnue losartan  25 mg daily.  We will move forward with referral to ECHO and Cardiology.  Call EMS if  pass out again.  Drink lot of water.

## 2024-06-13 ENCOUNTER — Encounter: Payer: Self-pay | Admitting: *Deleted

## 2024-07-06 ENCOUNTER — Ambulatory Visit (HOSPITAL_BASED_OUTPATIENT_CLINIC_OR_DEPARTMENT_OTHER)

## 2024-07-06 DIAGNOSIS — R0609 Other forms of dyspnea: Secondary | ICD-10-CM | POA: Diagnosis not present

## 2024-07-06 DIAGNOSIS — R55 Syncope and collapse: Secondary | ICD-10-CM | POA: Diagnosis not present

## 2024-07-06 LAB — ECHOCARDIOGRAM COMPLETE
AR max vel: 1.33 cm2
AV Area VTI: 1.16 cm2
AV Area mean vel: 1.2 cm2
AV Mean grad: 4 mmHg
AV Peak grad: 7.3 mmHg
Ao pk vel: 1.35 m/s
Area-P 1/2: 2.8 cm2
S' Lateral: 2.38 cm

## 2024-07-07 ENCOUNTER — Ambulatory Visit: Payer: Self-pay | Admitting: Family Medicine

## 2024-08-01 ENCOUNTER — Encounter: Payer: Self-pay | Admitting: Internal Medicine

## 2024-08-01 ENCOUNTER — Ambulatory Visit (INDEPENDENT_AMBULATORY_CARE_PROVIDER_SITE_OTHER): Admitting: Internal Medicine

## 2024-08-01 VITALS — BP 124/70 | HR 70 | Temp 97.8°F | Ht 61.5 in | Wt 110.0 lb

## 2024-08-01 DIAGNOSIS — R21 Rash and other nonspecific skin eruption: Secondary | ICD-10-CM | POA: Diagnosis not present

## 2024-08-01 MED ORDER — PREDNISONE 20 MG PO TABS: 40.0000 mg | ORAL_TABLET | Freq: Every day | ORAL | 0 refills | Status: DC

## 2024-08-01 NOTE — Assessment & Plan Note (Addendum)
 Not hives Looks like insect bites No recent travel No known exposures Continue triamcinolone  Prednisone  burst again Can continue allegra for the itching

## 2024-08-01 NOTE — Progress Notes (Signed)
 Subjective:    Patient ID: [name], female    DOB: 08/20/1941, 83 y.o.   MRN: [id number]  HPI Here due to a possible allergic reaction  Has a rash--that is different than the one from a couple of months ago Thinks it is an allergic reaction Red splotches of various sizes Very itchy  First happened after working in husband's garden This rash looks different though Not exposed again Now using TAC--but doesn't help much (got prednisone  also)  Current episode started 2-3 weeks ago They seem to come and go  Did try husband's allegra for 3 days  No new medications  Current Outpatient Medications on File Prior to Visit  Medication Sig Dispense Refill   amLODipine  (NORVASC ) 5 MG tablet Take 1 tablet (5 mg total) by mouth daily. 90 tablet 3   aspirin  EC 81 MG EC tablet Take 1 tablet (81 mg total) by mouth daily. Swallow whole. 30 tablet 11   baclofen (LIORESAL) 10 MG tablet Take 10 mg by mouth daily as needed. For headache     carbamide peroxide (DEBROX) 6.5 % OTIC solution Place 5 drops into the right ear daily as needed (for wax build up).     clorazepate  (TRANXENE ) 7.5 MG tablet Take 1 tablet by mouth twice daily as needed for anxiety 20 tablet 0   Evolocumab  (REPATHA  SURECLICK) 140 MG/ML SOAJ Inject 140 mg into the skin every 14 (fourteen) days. Please bill secondary  -  ID: [id number],   BIN: [id number],    PCN: [id number],  Group: [id number] 6 mL 1   furosemide  (LASIX ) 40 MG tablet TAKE 1 TABLET BY MOUTH ONCE DAILY HOLD  IF  NO  AM  LEG  SWELLING 90 tablet 0   lidocaine  (LIDODERM ) 5 % Place 1 patch onto the skin daily. Remove & Discard patch within 12 hours or as directed by MD 30 patch 1   losartan  (COZAAR ) 25 MG tablet Take 1 tablet by mouth once daily 90 tablet 0   Multiple Vitamin (MULTIVITAMIN) tablet Take 1 tablet by mouth daily.     Potassium Gluconate 550 MG TABS Take 1,100 mg by mouth daily.     Probiotic Product (PROBIOTIC PO) Take 1 capsule by mouth daily.      triamcinolone  cream (KENALOG ) 0.1 % Apply 1 Application topically 2 (two) times daily. 454 g 0   triamcinolone  ointment (KENALOG ) 0.1 % Apply three times weekly, increasing to daily for flares as needed 30 g 4   vitamin B-12 (CYANOCOBALAMIN ) 1000 MCG tablet Take 1,000 mcg by mouth daily.     zonisamide (ZONEGRAN) 100 MG capsule Take 100 mg by mouth daily.     No current facility-administered medications on file prior to visit.    Allergies  Allergen Reactions   Clonazepam Swelling and Other (See Comments)    REACTION: Swollen lips, sore mouth   Penicillins Other (See Comments)    Unrecalled reaction Has patient had a PCN reaction causing immediate rash, facial/tongue/throat swelling, SOB or lightheadedness with hypotension: Unknown Has patient had a PCN reaction causing severe rash involving mucus membranes or skin necrosis: Unknown Has patient had a PCN reaction that required hospitalization: Unknown Has patient had a PCN reaction occurring within the last 10 years: No If all of the above answers are NO, then may proceed with Cephalosporin use.    Statins Other (See Comments)    Severe muscle pain    Past Medical History:  Diagnosis Date   Anxiety  Aortic atherosclerosis (HCC)    GERD (gastroesophageal reflux disease)    Dr. 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 Morris MUNGO (hard of hearing)    uses hearing aid on left side/complete loss of hearing right side   IBS (irritable bowel syndrome)    LBBB (left bundle branch block)    Migraine    OA (osteoarthritis)    Osteopenia 2005   Ulcer 2003   bleeding stomach ulcer    Past Surgical History:  Procedure Laterality Date   ABDOMINAL HYSTERECTOMY  1983   APPENDECTOMY  1983   BOWEL RESECTION N/A 01/20/2019   Procedure: DIAGNOSTIC LAP WITH SMALL BOWEL RESECTION;  Surgeon:  Stevie Herlene Righter, MD;  Location: [facility];  Service: General;  Laterality: N/A;   CARDIAC CATHETERIZATION  1997   okay, mild changes   CHOLECYSTECTOMY  1992   COLONOSCOPY  2003   DEXA N/A 2005   borderline osteopenia   DIAGNOSTIC LAPAROSCOPY  11/25/2018   LAPAROSCOPY N/A 11/25/2018   Procedure: LAPAROSCOPY DIAGNOSTIC;  Surgeon: Stevie Herlene Righter, MD;  Location: [facility];  Service: General;  Laterality: N/A;   TONSILLECTOMY AND ADENOIDECTOMY  1953   UPPER GASTROINTESTINAL ENDOSCOPY  2003    Family History  Problem Relation Age of Onset   Heart attack Father 38   Diabetes Father    Coronary artery disease Father    COPD Mother    Hypertension Mother    Hypertension Sister    Pulmonary fibrosis Sister    Cancer Daughter        breast   Rheum arthritis Daughter    Colon cancer Neg Hx    Esophageal cancer Neg Hx    Rectal cancer Neg Hx     Social History   Socioeconomic History   Marital status: Married    Spouse name: Tanda   Number of children: 2   Years of education: Not on file   Highest education level: Not on file  Occupational History   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  Tobacco Use   Smoking status: Never    Passive exposure: Past   Smokeless tobacco: Never  Vaping Use   Vaping status: Never Used  Substance and Sexual Activity   Alcohol use: No   Drug use: No   Sexual activity: Not on file  Other Topics Concern   Not on file  Social History Narrative   Has living will   Husband, then daughters, are [place] health care POA   Would accept resuscitation attempts but no prolonged artifical ventilation   No prolonged tube feeds if cognitively impaired   Social Drivers of Corporate investment banker Strain: Not on file  Food Insecurity: Not on file  Transportation Needs: Not on file  Physical Activity: Not on file  Stress: Not on file  Social Connections: Not on file  Intimate Partner Violence: Not on file   Review of Systems No  fever No lip or mouth swelling No recent travel    Objective:   Physical Exam Constitutional:      Appearance: Normal appearance.  Skin:    Comments: Isolated red papules mostly on legs--one on lower abdomen  Neurological:     Mental Status: She is alert.  Assessment & Plan:

## 2024-08-11 DIAGNOSIS — G43719 Chronic migraine without aura, intractable, without status migrainosus: Secondary | ICD-10-CM | POA: Diagnosis not present

## 2024-08-18 ENCOUNTER — Encounter: Payer: Self-pay | Admitting: Family Medicine

## 2024-08-19 ENCOUNTER — Encounter: Payer: Self-pay | Admitting: Family Medicine

## 2024-08-19 MED ORDER — COVID-19 MRNA VAC-TRIS(PFIZER) 30 MCG/0.3ML IM SUSY: 0.3000 mL | PREFILLED_SYRINGE | Freq: Once | INTRAMUSCULAR | 0 refills | Status: AC

## 2024-08-21 ENCOUNTER — Other Ambulatory Visit: Payer: Self-pay | Admitting: Family Medicine

## 2024-08-23 ENCOUNTER — Telehealth: Payer: Self-pay | Admitting: *Deleted

## 2024-08-23 DIAGNOSIS — E538 Deficiency of other specified B group vitamins: Secondary | ICD-10-CM

## 2024-08-23 DIAGNOSIS — I10 Essential (primary) hypertension: Secondary | ICD-10-CM

## 2024-08-23 NOTE — Telephone Encounter (Signed)
-----   Message from Veva JINNY Ferrari sent at 08/18/2024 11:58 AM EDT ----- Regarding: Lab orders forTue, 9.23.25 Patient is scheduled for CPX labs, please order future labs, Thanks , Veva

## 2024-08-25 ENCOUNTER — Encounter: Payer: Self-pay | Admitting: Family Medicine

## 2024-08-25 ENCOUNTER — Ambulatory Visit (INDEPENDENT_AMBULATORY_CARE_PROVIDER_SITE_OTHER): Admitting: Family Medicine

## 2024-08-25 VITALS — BP 122/64 | HR 78 | Temp 98.0°F | Ht 61.5 in | Wt 110.4 lb

## 2024-08-25 DIAGNOSIS — G3184 Mild cognitive impairment, so stated: Secondary | ICD-10-CM

## 2024-08-25 DIAGNOSIS — R55 Syncope and collapse: Secondary | ICD-10-CM

## 2024-08-25 DIAGNOSIS — F39 Unspecified mood [affective] disorder: Secondary | ICD-10-CM | POA: Diagnosis not present

## 2024-08-25 DIAGNOSIS — I10 Essential (primary) hypertension: Secondary | ICD-10-CM | POA: Diagnosis not present

## 2024-08-25 NOTE — Progress Notes (Signed)
 Patient ID: [name], female    DOB: 05-05-1941, 83 y.o.   MRN: [id number]  This visit was conducted in person.  BP 122/64   Pulse 78   Temp 98 F (36.7 C) (Oral)   Ht 5' 1.5 (1.562 m)   Wt 110 lb 6 oz (50.1 kg)   SpO2 95%   BMI 20.52 kg/m    CC:  Chief Complaint  Patient presents with   Memory Loss    Pt is concerned about short term memory loss.     Subjective:   HPI: [name] is a 83 y.o. female presenting on 08/25/2024 for Memory Loss (Pt is concerned about short term memory loss. ) Daughter [name] with her at appt. , providing a significant portion of history. Patient has history of mild cognitive impairment that appears to have been noted by previous PCP since 2020.  Lab evaluation was unremarkable.  Per notes started on benazepril trial 5 mg in 2023 but this was stopped given possible associated blood pressure elevation. Per notes daughter needing to help with finances but no significant progression of memory loss.  She does have a history of mood disorder.  She uses Tranxene  7.5 mg twice daily as needed anxiety  She is also on Zonegran 100 mg p.o. daily for migraines per Dr. Oneita. Of note she had several spells of  near-syncope that she was evaluated for in July 2025. Neurologic exam was normal.  Referred to cardiology for cardiac cause of presyncope Evaluation of new murmur on exam with echocardiogram: EF 60 to 65%, very minimal mitral valve regurgitation. Has Cardiology evaluation scheduled as new patient with Dr. Oneil Flores on October 18, 2024.  Recent labs including TSH, CMET and cbc in May 2025 within normal limits.   She reports gradual worsening of memory in last 6-7 months.  Daughters have noted she has been forgetting to brush teeth, forgetting to take out hearing aide, conversations.  She gets frustrated about forgetting things. Seems to come and go.no acute change.    MRI brain 01/2023:  no acute intracranial abn... dx TIA.    Has  not taken clorazepate   in last 6 months.   Father MI age 53,  Mother passed away age late 31s.  Does brain games, reading.  Flowsheet Row Office Visit from 08/25/2024 in [facility] [place]  PHQ-2 Total Score 3      08/25/2024    3:24 PM 07/31/2023   10:05 AM 03/25/2023   11:16 AM 02/10/2023    2:12 PM  GAD 7 : Generalized Anxiety Score  Nervous, Anxious, on Edge 1 1 1 1   Control/stop worrying 1 0 0 1  Worry too much - different things 1 0 0 0  Trouble relaxing 1 0 0 0  Restless 0 0 0 0  Easily annoyed or irritable 0 0 0 0  Afraid - awful might happen 0 0 0 0  Total GAD 7 Score 4 1 1 2   Anxiety Difficulty  Not difficult at all Not difficult at all Not difficult at all     Relevant past medical, surgical, family and social history reviewed and updated as indicated. Interim medical history since our last visit reviewed. Allergies and medications reviewed and updated. Outpatient Medications Prior to Visit  Medication Sig Dispense Refill   amLODipine  (NORVASC ) 5 MG tablet Take 1 tablet (5 mg total) by mouth daily. 90 tablet 3   aspirin  EC 81 MG EC tablet  Take 1 tablet (81 mg total) by mouth daily. Swallow whole. 30 tablet 11   baclofen (LIORESAL) 10 MG tablet Take 10 mg by mouth daily as needed. For headache     carbamide peroxide (DEBROX) 6.5 % OTIC solution Place 5 drops into the right ear daily as needed (for wax build up).     clorazepate  (TRANXENE ) 7.5 MG tablet Take 1 tablet by mouth twice daily as needed for anxiety 20 tablet 0   Evolocumab  (REPATHA  SURECLICK) 140 MG/ML SOAJ Inject 140 mg into the skin every 14 (fourteen) days. Please bill secondary  -  ID: [id number],   BIN: [id number],    PCN: [id number],  Group: [id number] 6 mL 1   furosemide  (LASIX ) 40 MG tablet TAKE 1 TABLET BY MOUTH ONCE DAILY HOLD  IF  NO  AM  LEG  SWELLING 90 tablet 0   lidocaine  (LIDODERM ) 5 % Place 1 patch onto the skin daily. Remove & Discard patch within 12 hours or as directed by MD  30 patch 1   losartan  (COZAAR ) 25 MG tablet Take 1 tablet by mouth once daily 90 tablet 0   Multiple Vitamin (MULTIVITAMIN) tablet Take 1 tablet by mouth daily.     Potassium Gluconate 550 MG TABS Take 1,100 mg by mouth daily.     Probiotic Product (PROBIOTIC PO) Take 1 capsule by mouth daily.     triamcinolone  cream (KENALOG ) 0.1 % Apply 1 Application topically 2 (two) times daily. 454 g 0   triamcinolone  ointment (KENALOG ) 0.1 % Apply three times weekly, increasing to daily for flares as needed 30 g 4   vitamin B-12 (CYANOCOBALAMIN ) 1000 MCG tablet Take 1,000 mcg by mouth daily.     zonisamide (ZONEGRAN) 100 MG capsule Take 100 mg by mouth daily.     predniSONE  (DELTASONE ) 20 MG tablet Take 2 tablets (40 mg total) by mouth daily. For 5 days, then 1 tab daily for 5 days 15 tablet 0   No facility-administered medications prior to visit.     Per HPI unless specifically indicated in ROS section below Review of Systems  Constitutional:  Negative for fatigue and fever.  HENT:  Negative for congestion.   Eyes:  Negative for pain.  Respiratory:  Negative for cough and shortness of breath.   Cardiovascular:  Negative for chest pain, palpitations and leg swelling.  Gastrointestinal:  Negative for abdominal pain.  Genitourinary:  Negative for dysuria and vaginal bleeding.  Musculoskeletal:  Negative for back pain.  Neurological:  Negative for syncope, light-headedness and headaches.  Psychiatric/Behavioral:  Positive for confusion. Negative for dysphoric mood. The patient is nervous/anxious.    Objective:  BP 122/64   Pulse 78   Temp 98 F (36.7 C) (Oral)   Ht 5' 1.5 (1.562 m)   Wt 110 lb 6 oz (50.1 kg)   SpO2 95%   BMI 20.52 kg/m   Wt Readings from Last 3 Encounters:  09/05/24 109 lb (49.4 kg)  08/25/24 110 lb 6 oz (50.1 kg)  08/01/24 110 lb (49.9 kg)      Physical Exam Constitutional:      General: She is not in acute distress.    Appearance: Normal appearance. She is  well-developed. She is not ill-appearing or toxic-appearing.  HENT:     Head: Normocephalic.     Right Ear: Hearing, tympanic membrane, ear canal and external ear normal. Tympanic membrane is not erythematous, retracted or bulging.     Left Ear: Hearing, tympanic membrane, ear canal and  external ear normal. Tympanic membrane is not erythematous, retracted or bulging.     Nose: No mucosal edema or rhinorrhea.     Right Sinus: No maxillary sinus tenderness or frontal sinus tenderness.     Left Sinus: No maxillary sinus tenderness or frontal sinus tenderness.     Mouth/Throat:     Pharynx: Uvula midline.  Eyes:     General: Lids are normal. Lids are everted, no foreign bodies appreciated.     Conjunctiva/sclera: Conjunctivae normal.     Pupils: Pupils are equal, round, and reactive to light.  Neck:     Thyroid : No thyroid  mass or thyromegaly.     Vascular: No carotid bruit.     Trachea: Trachea normal.  Cardiovascular:     Rate and Rhythm: Normal rate and regular rhythm.     Pulses: Normal pulses.     Heart sounds: Normal heart sounds, S1 normal and S2 normal. No murmur heard.    No friction rub. No gallop.  Pulmonary:     Effort: Pulmonary effort is normal. No tachypnea or respiratory distress.     Breath sounds: Normal breath sounds. No decreased breath sounds, wheezing, rhonchi or rales.  Abdominal:     General: Bowel sounds are normal.     Palpations: Abdomen is soft.     Tenderness: There is no abdominal tenderness.  Musculoskeletal:     Cervical back: Normal range of motion and neck supple.  Skin:    General: Skin is warm and dry.     Findings: No rash.  Neurological:     General: No focal deficit present.     Mental Status: She is alert and oriented to person, place, and time.     Cranial Nerves: No cranial nerve deficit.     Sensory: No sensory deficit.     Motor: No weakness.     Coordination: Coordination normal.     Gait: Gait normal.     Deep Tendon Reflexes:  Reflexes normal.  Psychiatric:        Mood and Affect: Mood is not anxious or depressed.        Speech: Speech normal.        Behavior: Behavior normal. Behavior is cooperative.        Thought Content: Thought content normal.        Judgment: Judgment normal.     MMSE 28  9 in 15 sec animal recall.    Results for orders placed or performed in visit on 07/06/24  ECHOCARDIOGRAM COMPLETE   Collection Time: 07/06/24  4:23 PM  Result Value Ref Range   Area-P 1/2 2.80 cm2   S' Lateral 2.38 cm   AR max vel 1.33 cm2   AV Area VTI 1.16 cm2   AV Area mean vel 1.20 cm2   AV Mean grad 4.0 mmHg   AV Peak grad 7.3 mmHg   Ao pk vel 1.35 m/s   Est EF 60 - 65%     Assessment and Plan  Mild cognitive impairment Assessment & Plan:  Chronic, gradual worsening MMSE 28  9 in 15 sec animal recall.   MRI brain 01/2023:  no acute intracranial abn... dx TIA.  Discuss changing Zonegran to a different med with Dr. Oneita given memory concerns. Will reevaluate with labs for possible secondary causes of memory issues. Continue memory games.  Start regular exercise.  Pt will consider starting a medication such as  a retry of Aricept  or  new trial Namenda for  memory.      Syncope, unspecified syncope type Assessment & Plan: No further spells   Mood disorder Assessment & Plan:  Chronic, but rarely .  Rarely uses tranxene ... has not used  more than 4-5 times a year.   Essential hypertension, benign Assessment & Plan: Stable, chronic.  Continue current medication.  amlodipine  5 mg daily and losartan  25 mg daily.     No follow-ups on file.   Greig Ring, MD

## 2024-08-25 NOTE — Patient Instructions (Signed)
 Discuss changing Zonegran to a different med with Dr. Oneita given memory concerns.  WE will check labs.  (continue memory games.  Start regular exercise. Let me know if you want to consider starting a medication such as  a retry of Aricept  or  new trial Namenda for memory.

## 2024-08-26 ENCOUNTER — Other Ambulatory Visit (HOSPITAL_COMMUNITY): Payer: Self-pay

## 2024-08-26 ENCOUNTER — Telehealth: Payer: Self-pay | Admitting: Pharmacy Technician

## 2024-08-26 NOTE — Telephone Encounter (Signed)
 [facility]  Pharmacy Patient Advocate Encounter   Received notification from Onbase that prior authorization for repatha  is required/requested.   Insurance verification completed.   The patient is insured through [facility] .   Per test claim: PA required; PA submitted to above mentioned insurance via Latent Key/confirmation #/EOC [id number] Status is pending

## 2024-08-26 NOTE — Telephone Encounter (Signed)
 Pharmacy Patient Advocate Encounter  Received notification from [employer] that Prior Authorization for repatha  has been APPROVED from 08/26/24 to 12/07/24   PA #/Case ID/Reference #: [id number]

## 2024-08-30 ENCOUNTER — Other Ambulatory Visit (INDEPENDENT_AMBULATORY_CARE_PROVIDER_SITE_OTHER)

## 2024-08-30 ENCOUNTER — Ambulatory Visit: Payer: Self-pay | Admitting: Family Medicine

## 2024-08-30 DIAGNOSIS — E538 Deficiency of other specified B group vitamins: Secondary | ICD-10-CM | POA: Diagnosis not present

## 2024-08-30 DIAGNOSIS — I10 Essential (primary) hypertension: Secondary | ICD-10-CM | POA: Diagnosis not present

## 2024-08-30 LAB — COMPREHENSIVE METABOLIC PANEL WITH GFR
ALT: 13 U/L (ref 0–35)
AST: 17 U/L (ref 0–37)
Albumin: 3.8 g/dL (ref 3.5–5.2)
Alkaline Phosphatase: 69 U/L (ref 39–117)
BUN: 9 mg/dL (ref 6–23)
CO2: 26 meq/L (ref 19–32)
Calcium: 8.9 mg/dL (ref 8.4–10.5)
Chloride: 107 meq/L (ref 96–112)
Creatinine, Ser: 0.87 mg/dL (ref 0.40–1.20)
GFR: 62.03 mL/min (ref >60.00)
Glucose, Bld: 100 mg/dL — ABNORMAL HIGH (ref 70–99)
Potassium: 3.8 meq/L (ref 3.5–5.1)
Sodium: 140 meq/L (ref 135–145)
Total Bilirubin: 0.3 mg/dL (ref 0.2–1.2)
Total Protein: 5.9 g/dL — ABNORMAL LOW (ref 6.0–8.3)

## 2024-08-30 LAB — LIPID PANEL
Cholesterol: 112 mg/dL (ref 0–200)
HDL: 56.7 mg/dL (ref >39.00)
LDL Cholesterol: 18 mg/dL (ref 0–99)
NonHDL: 55.18
Total CHOL/HDL Ratio: 2
Triglycerides: 184 mg/dL — ABNORMAL HIGH (ref 0.0–149.0)
VLDL: 36.8 mg/dL (ref 0.0–40.0)

## 2024-08-30 LAB — VITAMIN B12: Vitamin B-12: 466 pg/mL (ref 211–911)

## 2024-08-30 NOTE — Progress Notes (Signed)
 No critical labs need to be addressed urgently. We will discuss labs in detail at upcoming office visit.

## 2024-09-05 ENCOUNTER — Ambulatory Visit

## 2024-09-05 VITALS — BP 123/63 | Ht 61.5 in | Wt 109.0 lb

## 2024-09-05 DIAGNOSIS — Z0000 Encounter for general adult medical examination without abnormal findings: Secondary | ICD-10-CM

## 2024-09-05 NOTE — Progress Notes (Signed)
 Because this visit was a virtual/telehealth visit,  certain criteria was not obtained, such a blood pressure, CBG if applicable, and timed get up and go. Any medications not marked as taking were not mentioned during the medication reconciliation part of the visit. Any vitals not documented were not able to be obtained due to this being a telehealth visit or patient was unable to self-report a recent blood pressure reading due to a lack of equipment at home via telehealth. Vitals that have been documented are verbally provided by the patient.   This visit was performed by a medical professional under my direct supervision. I was immediately available for consultation/collaboration. I have reviewed and agree with the Annual Wellness Visit documentation.  Subjective:   [name] is a 83 y.o. who presents for a Medicare Wellness preventive visit.  As a reminder, Annual Wellness Visits don't include a physical exam, and some assessments may be limited, especially if this visit is performed virtually. We may recommend an in-person follow-up visit with your provider if needed.  Visit Complete: Virtual I connected with  [name] on 09/05/24 by a audio enabled telemedicine application and verified that I am speaking with the correct person using two identifiers.  Patient Location: Home  Provider Location: Home Office  I discussed the limitations of evaluation and management by telemedicine. The patient expressed understanding and agreed to proceed.  Vital Signs: Because this visit was a virtual/telehealth visit, some criteria may be missing or patient reported. Any vitals not documented were not able to be obtained and vitals that have been documented are patient reported.  VideoDeclined- This patient declined Librarian, academic. Therefore the visit was completed with audio only.  Persons Participating in Visit: Patient.  AWV Questionnaire: No: Patient  Medicare AWV questionnaire was not completed prior to this visit.  Cardiac Risk Factors include: advanced age (>46men, >54 women)     Objective:    Today's Vitals   09/05/24 1558  BP: 123/63  Weight: 109 lb (49.4 kg)  Height: 5' 1.5 (1.562 m)   Body mass index is 20.26 kg/m.     09/05/2024    3:57 PM 02/02/2023   11:27 AM 01/18/2022   12:08 AM 01/15/2022   10:52 PM 01/20/2019    6:02 AM 01/17/2019   10:50 AM 11/25/2018   10:43 AM  Advanced Directives  Does Patient Have a Medical Advance Directive? Yes No No No Yes  Yes  Yes   Type of Estate agent of [place];Living will    Living will;Healthcare Power of Attorney Living will;Healthcare Power of [employer] Power of [place];Living will  Does patient want to make changes to medical advance directive? No - Patient declined    No - Patient declined  No - Patient declined  No - Patient declined   Copy of Healthcare Power of Attorney in Chart? No - copy requested    No - copy requested  No - copy requested  No - copy requested   Would patient like information on creating a medical advance directive?  No - Patient declined          Data saved with a previous flowsheet row definition    Current Medications (verified) Outpatient Encounter Medications as of 09/05/2024  Medication Sig   amLODipine  (NORVASC ) 5 MG tablet Take 1 tablet (5 mg total) by mouth daily.   aspirin  EC 81 MG EC tablet Take 1 tablet (81 mg total) by mouth daily. Swallow whole.  baclofen (LIORESAL) 10 MG tablet Take 10 mg by mouth daily as needed. For headache   carbamide peroxide (DEBROX) 6.5 % OTIC solution Place 5 drops into the right ear daily as needed (for wax build up).   clorazepate  (TRANXENE ) 7.5 MG tablet Take 1 tablet by mouth twice daily as needed for anxiety   Evolocumab  (REPATHA  SURECLICK) 140 MG/ML SOAJ Inject 140 mg into the skin every 14 (fourteen) days. Please bill secondary  -  ID: [id number],   BIN: [id number],    PCN:  [id number],  Group: [id number]   furosemide  (LASIX ) 40 MG tablet TAKE 1 TABLET BY MOUTH ONCE DAILY HOLD  IF  NO  AM  LEG  SWELLING   lidocaine  (LIDODERM ) 5 % Place 1 patch onto the skin daily. Remove & Discard patch within 12 hours or as directed by MD   losartan  (COZAAR ) 25 MG tablet Take 1 tablet by mouth once daily   Multiple Vitamin (MULTIVITAMIN) tablet Take 1 tablet by mouth daily.   Potassium Gluconate 550 MG TABS Take 1,100 mg by mouth daily.   Probiotic Product (PROBIOTIC PO) Take 1 capsule by mouth daily.   triamcinolone  cream (KENALOG ) 0.1 % Apply 1 Application topically 2 (two) times daily.   triamcinolone  ointment (KENALOG ) 0.1 % Apply three times weekly, increasing to daily for flares as needed   vitamin B-12 (CYANOCOBALAMIN ) 1000 MCG tablet Take 1,000 mcg by mouth daily.   zonisamide (ZONEGRAN) 100 MG capsule Take 100 mg by mouth daily.   No facility-administered encounter medications on file as of 09/05/2024.    Allergies (verified) Clonazepam, Penicillins, and Statins   History: Past Medical History:  Diagnosis Date   Anxiety    Aortic atherosclerosis    GERD (gastroesophageal reflux disease)    Dr. 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 Morris MUNGO (hard of hearing)    uses hearing aid on left side/complete loss of hearing right side   IBS (irritable bowel syndrome)    LBBB (left bundle branch block)    Migraine    OA (osteoarthritis)    Osteopenia 2005   Ulcer 2003   bleeding stomach ulcer   Past Surgical History:  Procedure Laterality Date   ABDOMINAL HYSTERECTOMY  1983   APPENDECTOMY  1983   BOWEL RESECTION N/A 01/20/2019   Procedure: DIAGNOSTIC LAP WITH SMALL BOWEL RESECTION;  Surgeon: Stevie Herlene Righter, MD;  Location: [facility];  Service: General;  Laterality: N/A;   CARDIAC CATHETERIZATION  1997   okay, mild changes    CHOLECYSTECTOMY  1992   COLONOSCOPY  2003   DEXA N/A 2005   borderline osteopenia   DIAGNOSTIC LAPAROSCOPY  11/25/2018   LAPAROSCOPY N/A 11/25/2018   Procedure: LAPAROSCOPY DIAGNOSTIC;  Surgeon: Stevie Herlene Righter, MD;  Location: [facility];  Service: General;  Laterality: N/A;   TONSILLECTOMY AND ADENOIDECTOMY  1953   UPPER GASTROINTESTINAL ENDOSCOPY  2003   Family History  Problem Relation Age of Onset   Heart attack Father 40   Diabetes Father    Coronary artery disease Father    COPD Mother    Hypertension Mother    Hypertension Sister    Pulmonary fibrosis Sister    Cancer Daughter        breast   Rheum arthritis  Daughter    Colon cancer Neg Hx    Esophageal cancer Neg Hx    Rectal cancer Neg Hx    Social History   Socioeconomic History   Marital status: Married    Spouse name: Tanda   Number of children: 2   Years of education: Not on file   Highest education level: Not on file  Occupational History   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  Tobacco Use   Smoking status: Never    Passive exposure: Past   Smokeless tobacco: Never  Vaping Use   Vaping status: Never Used  Substance and Sexual Activity   Alcohol use: No   Drug use: No   Sexual activity: Not on file  Other Topics Concern   Not on file  Social History Narrative   Has living will   Husband, then daughters, are [facility] care POA   Would accept resuscitation attempts but no prolonged artifical ventilation   No prolonged tube feeds if cognitively impaired   Social Drivers of Corporate investment banker Strain: Low Risk  (09/05/2024)   Overall Financial Resource Strain (CARDIA)    Difficulty of Paying Living Expenses: Not hard at all  Food Insecurity: No Food Insecurity (09/05/2024)   Hunger Vital Sign    Worried About Running Out of Food in the Last Year: Never true    Ran Out of Food in the Last Year: Never true  Transportation Needs: No Transportation Needs (09/05/2024)    PRAPARE - Administrator, Civil Service (Medical): No    Lack of Transportation (Non-Medical): No  Physical Activity: Insufficiently Active (09/05/2024)   Exercise Vital Sign    Days of Exercise per Week: 3 days    Minutes of Exercise per Session: 30 min  Stress: No Stress Concern Present (09/05/2024)   [employer] of Occupational Health - Occupational Stress Questionnaire    Feeling of Stress: Not at all  Social Connections: Socially Integrated (09/05/2024)   Social Connection and Isolation Panel    Frequency of Communication with Friends and Family: More than three times a week    Frequency of Social Gatherings with Friends and Family: More than three times a week    Attends Religious Services: More than 4 times per year    Active Member of [employer] or Organizations: Yes    Attends Engineer, structural: More than 4 times per year    Marital Status: Married    Tobacco Counseling Counseling given: Not Answered    Clinical Intake:  Pre-visit preparation completed: Yes  Pain : No/denies pain     BMI - recorded: 20.26 Nutritional Status: BMI of 19-24  Normal Nutritional Risks: None Diabetes: No  Lab Results  Component Value Date   HGBA1C 5.2 01/16/2022     How often do you need to have someone help you when you read instructions, pamphlets, or other written materials from your doctor or pharmacy?: 1 - Never  Interpreter Needed?: No  Information entered by :: [name] [email or web address]   Activities of Daily Living     09/05/2024    4:01 PM  In your present state of health, do you have any difficulty performing the following activities:  Hearing? 0  Vision? 0  Difficulty concentrating or making decisions? 1  Walking or climbing stairs? 0  Dressing or bathing? 0  Doing errands, shopping? 0  Preparing Food and eating ? N  Using the Toilet? N  In the past six months,  have you accidently leaked urine? N  Do you have problems with loss of bowel control? Y   Managing your Medications? N  Managing your Finances? N  Housekeeping or managing your Housekeeping? N    Patient Care Team: Olivia Greig BRAVO, MD as PCP - General (Family Medicine)  I have updated your Care Teams any recent Medical Services you may have received from other providers in the past year.     Assessment:   This is a routine wellness examination for [name].  Hearing/Vision screen Hearing Screening - Comments:: Patient is deaf in her right ear  Vision Screening - Comments:: Patient wears glasses    Goals Addressed               This Visit's Progress     Patient Stated (pt-stated)        To get more exercise       Depression Screen     09/05/2024    4:03 PM 08/25/2024    3:22 PM 05/19/2024   12:07 PM 03/08/2024   11:11 AM 09/03/2023   10:04 AM 09/03/2023    9:36 AM 07/31/2023   10:04 AM  PHQ 2/9 Scores  PHQ - 2 Score 0 3 0 0 0 0 0  PHQ- 9 Score 0 7    2 1     Fall Risk     09/05/2024    4:00 PM 08/25/2024    3:22 PM 05/19/2024   12:07 PM 03/08/2024   11:11 AM 09/03/2023   10:04 AM  Fall Risk   Falls in the past year? 0 0 0 0 0  Number falls in past yr: 0 0 0 0   Injury with Fall? 0  0 0   Risk for fall due to : No Fall Risks No Fall Risks No Fall Risks No Fall Risks   Follow up Falls evaluation completed  Falls evaluation completed Falls evaluation completed     MEDICARE RISK AT HOME:  Medicare Risk at Home Any stairs in or around the home?: No If so, are there any without handrails?: No Home free of loose throw rugs in walkways, pet beds, electrical cords, etc?: Yes Adequate lighting in your home to reduce risk of falls?: Yes Life alert?: No Use of a cane, walker or w/c?: No Grab bars in the bathroom?: Yes Shower chair or bench in shower?: No Elevated toilet seat or a handicapped toilet?: Yes  TIMED UP AND GO:  Was the test performed?  No  Cognitive Function: 6CIT completed        09/05/2024    4:04 PM  6CIT Screen  What Year? 0 points   What month? 0 points  What time? 0 points  Count back from 20 0 points  Months in reverse 0 points  Repeat phrase 0 points  Total Score 0 points    Immunizations Immunization History  Administered Date(s) Administered   Fluad Quad(high Dose 65+) 08/11/2019, 09/19/2020   Hep A / Hep B 10/12/2023   INFLUENZA, HIGH DOSE SEASONAL PF 09/05/2021, 08/29/2022   Influenza Split 08/27/2012   Influenza Whole 10/15/2007, 09/26/2009, 10/01/2010   Influenza,inj,Quad PF,6+ Mos 09/24/2015, 09/24/2016, 09/24/2017, 09/09/2018   Influenza-Unspecified 09/21/2014, 08/03/2023   PFIZER(Purple Top)SARS-COV-2 Vaccination 12/23/2019, 01/13/2020, 09/11/2020, 03/20/2021, 09/02/2023   Pfizer Covid-19 Vaccine Bivalent Booster 91yrs & up 09/05/2021   Pfizer(Comirnaty)Fall Seasonal Vaccine 12 years and older 09/22/2022, 03/11/2024   Pneumococcal Conjugate-13 06/04/2015   Pneumococcal Polysaccharide-23 08/04/2008   Pneumococcal-Unspecified 02/20/2011   Respiratory Syncytial  Virus Vaccine,Recomb Aduvanted(Arexvy) 03/02/2023   Td 12/08/2001, 04/16/2012, 08/03/2023   Varicella 02/20/2011   Zoster Recombinant(Shingrix) 03/02/2023, 05/13/2023    Screening Tests Health Maintenance  Topic Date Due   Influenza Vaccine  03/07/2025 (Originally 07/08/2024)   COVID-19 Vaccine (9 - Pfizer risk 2024-25 season) 09/10/2024   Medicare Annual Wellness (AWV)  09/05/2025   DTaP/Tdap/Td (4 - Tdap) 08/02/2033   Pneumococcal Vaccine: 50+ Years  Completed   DEXA SCAN  Completed   Zoster Vaccines- Shingrix  Completed   HPV VACCINES  Aged Out   Meningococcal B Vaccine  Aged Out   Hepatitis B Vaccines 19-59 Average Risk  Discontinued    Health Maintenance Items Addressed:patient declined   Additional Screening:  Vision Screening: Recommended annual ophthalmology exams for early detection of glaucoma and other disorders of the eye. Is the patient up to date with their annual eye exam?  No  Who is the provider or what is the  name of the office in which the patient attends annual eye exams?   Dental Screening: Recommended annual dental exams for proper oral hygiene  Community Resource Referral / Chronic Care Management: CRR required this visit?  No   CCM required this visit?  No   Plan:    I have personally reviewed and noted the following in the patient's chart:   Medical and social history Use of alcohol, tobacco or illicit drugs  Current medications and supplements including opioid prescriptions. Patient is not currently taking opioid prescriptions. Functional ability and status Nutritional status Physical activity Advanced directives List of other physicians Hospitalizations, surgeries, and ER visits in previous 12 months Vitals Screenings to include cognitive, depression, and falls Referrals and appointments  In addition, I have reviewed and discussed with patient certain preventive protocols, quality metrics, and best practice recommendations. A written personalized care plan for preventive services as well as general preventive health recommendations were provided to patient.   [name] Right, [place]   09/05/2024   After Visit Summary: (MyChart) Due to this being a telephonic visit, the after visit summary with patients personalized plan was offered to patient via MyChart   Notes: Nothing significant to report at this time.

## 2024-09-05 NOTE — Patient Instructions (Signed)
 [name],  Thank you for taking the time for your Medicare Wellness Visit. I appreciate your continued commitment to your health goals. Please review the care plan we discussed, and feel free to reach out if I can assist you further.  Medicare recommends these wellness visits once per year to help you and your care team stay ahead of potential health issues. These visits are designed to focus on prevention, allowing your provider to concentrate on managing your acute and chronic conditions during your regular appointments.  Please note that Annual Wellness Visits do not include a physical exam. Some assessments may be limited, especially if the visit was conducted virtually. If needed, we may recommend a separate in-person follow-up with your provider.  Ongoing Care Seeing your primary care provider every 3 to 6 months helps us  monitor your health and provide consistent, personalized care.   Referrals If a referral was made during today's visit and you haven't received any updates within two weeks, please contact the referred provider directly to check on the status.  Recommended Screenings:  Health Maintenance  Topic Date Due   Medicare Annual Wellness Visit  09/02/2024   Flu Shot  03/07/2025*   COVID-19 Vaccine (9 - Pfizer risk 2024-25 season) 09/10/2024   DTaP/Tdap/Td vaccine (4 - Tdap) 08/02/2033   Pneumococcal Vaccine for age over 39  Completed   DEXA scan (bone density measurement)  Completed   Zoster (Shingles) Vaccine  Completed   HPV Vaccine  Aged Out   Meningitis B Vaccine  Aged Out   Hepatitis B Vaccine  Discontinued  *Topic was postponed. The date shown is not the original due date.       09/05/2024    3:57 PM  Advanced Directives  Does Patient Have a Medical Advance Directive? Yes  Type of Estate agent of [place];Living will  Does patient want to make changes to medical advance directive? No - Patient declined  Copy of Healthcare Power of  Attorney in Chart? No - copy requested   Advance Care Planning is important because it: Ensures you receive medical care that aligns with your values, goals, and preferences. Provides guidance to your family and loved ones, reducing the emotional burden of decision-making during critical moments.  Vision: Annual vision screenings are recommended for early detection of glaucoma, cataracts, and diabetic retinopathy. These exams can also reveal signs of chronic conditions such as diabetes and high blood pressure.  Dental: Annual dental screenings help detect early signs of oral cancer, gum disease, and other conditions linked to overall health, including heart disease and diabetes.  Please see the attached documents for additional preventive care recommendations.

## 2024-09-06 ENCOUNTER — Ambulatory Visit: Admitting: Family Medicine

## 2024-09-06 VITALS — BP 110/62 | HR 67 | Temp 97.8°F | Ht 61.5 in | Wt 110.1 lb

## 2024-09-06 DIAGNOSIS — I700 Atherosclerosis of aorta: Secondary | ICD-10-CM | POA: Diagnosis not present

## 2024-09-06 DIAGNOSIS — K5090 Crohn's disease, unspecified, without complications: Secondary | ICD-10-CM | POA: Diagnosis not present

## 2024-09-06 DIAGNOSIS — M8580 Other specified disorders of bone density and structure, unspecified site: Secondary | ICD-10-CM

## 2024-09-06 DIAGNOSIS — E538 Deficiency of other specified B group vitamins: Secondary | ICD-10-CM

## 2024-09-06 DIAGNOSIS — I10 Essential (primary) hypertension: Secondary | ICD-10-CM | POA: Diagnosis not present

## 2024-09-06 DIAGNOSIS — E785 Hyperlipidemia, unspecified: Secondary | ICD-10-CM

## 2024-09-06 DIAGNOSIS — Z0000 Encounter for general adult medical examination without abnormal findings: Secondary | ICD-10-CM

## 2024-09-06 MED ORDER — AMLODIPINE BESYLATE 5 MG PO TABS: 5.0000 mg | ORAL_TABLET | Freq: Every day | ORAL | 3 refills | Status: AC

## 2024-09-06 MED ORDER — LOSARTAN POTASSIUM 25 MG PO TABS: 25.0000 mg | ORAL_TABLET | Freq: Every day | ORAL | 3 refills | Status: AC

## 2024-09-06 MED ORDER — REPATHA SURECLICK 140 MG/ML ~~LOC~~ SOAJ: 140.0000 mg | SUBCUTANEOUS | 1 refills | Status: AC

## 2024-09-06 MED ORDER — FUROSEMIDE 40 MG PO TABS: 20.0000 mg | ORAL_TABLET | Freq: Every day | ORAL | 1 refills | Status: AC

## 2024-09-06 NOTE — Assessment & Plan Note (Signed)
 Stable, chronic.  Continue current medication.  amlodipine 5 mg daily and losartan 25 mg daily.

## 2024-09-06 NOTE — Assessment & Plan Note (Signed)
 No statin due to apparent side effects.  On repatha  q 2 weeks per lipid clinic.

## 2024-09-06 NOTE — Assessment & Plan Note (Signed)
 On supplement

## 2024-09-06 NOTE — Assessment & Plan Note (Signed)
 On repatha

## 2024-09-06 NOTE — Assessment & Plan Note (Addendum)
 Chronic, gradual worsening MMSE 28  9 in 15 sec animal recall.   MRI brain 01/2023:  no acute intracranial abn... dx TIA.  Discuss changing Zonegran to a different med with Dr. Oneita given memory concerns. Will reevaluate with labs for possible secondary causes of memory issues. Continue memory games.  Start regular exercise.  Pt will consider starting a medication such as  a retry of Aricept  or  new trial Namenda for memory.

## 2024-09-06 NOTE — Progress Notes (Signed)
 Patient ID: [name], female    DOB: 1941-09-21, 83 y.o.   MRN: [id number]  This visit was conducted in person.  BP 110/62   Pulse 67   Temp 97.8 F (36.6 C) (Temporal)   Ht 5' 1.5 (1.562 m)   Wt 110 lb 2 oz (50 kg)   SpO2 99%   BMI 20.47 kg/m    CC:  Chief Complaint  Patient presents with   Annual Exam    Subjective:   HPI: [name] is a 83 y.o. female presenting on 09/06/2024 for Annual Exam  The patient presents for  annual and review of chronic health problems. She also has the following acute concerns today:  none  The patient saw a LPN or RN for medicare wellness visit. 09/06/2024  Prevention and wellness was reviewed in detail. Note reviewed and important notes copied below.  Elevated Cholesterol:  Goal LDL < 70 given history of TIA Lab Results  Component Value Date   CHOL 112 08/30/2024   HDL 56.70 08/30/2024   LDLCALC 18 08/30/2024   LDLDIRECT 82.0 07/30/2022   TRIG 184.0 (H) 08/30/2024   CHOLHDL 2 08/30/2024  Using medications without problems: Muscle aches:  Diet compliance: moderate, drinking a Boost 2-3 times a week. Exercise: walking 3 times week Other complaints: Wt Readings from Last 3 Encounters:  09/06/24 110 lb 2 oz (50 kg)  09/05/24 109 lb (49.4 kg)  08/25/24 110 lb 6 oz (50.1 kg)    Hypertension:  Well-controlled on amlodipine  5 mg daily and losartan  25 mg daily BP Readings from Last 3 Encounters:  09/06/24 110/62  09/05/24 123/63  08/25/24 122/64  Using medication without problems or lightheadedness:  none Chest pain with exertion: none Edema: chronic venous insufficiency.. right > left.. using furosemide  1/2 tablet  m-Sat Short of breath: Average home BPs: Other issues:      Relevant past medical, surgical, family and social history reviewed and updated as indicated. Interim medical history since our last visit reviewed. Allergies and medications reviewed and updated. Outpatient Medications Prior to Visit   Medication Sig Dispense Refill   aspirin  EC 81 MG EC tablet Take 1 tablet (81 mg total) by mouth daily. Swallow whole. 30 tablet 11   baclofen (LIORESAL) 10 MG tablet Take 10 mg by mouth daily as needed. For headache     carbamide peroxide (DEBROX) 6.5 % OTIC solution Place 5 drops into the right ear daily as needed (for wax build up).     clorazepate  (TRANXENE ) 7.5 MG tablet Take 1 tablet by mouth twice daily as needed for anxiety 20 tablet 0   lidocaine  (LIDODERM ) 5 % Place 1 patch onto the skin daily. Remove & Discard patch within 12 hours or as directed by MD 30 patch 1   Multiple Vitamin (MULTIVITAMIN) tablet Take 1 tablet by mouth daily.     Potassium Gluconate 550 MG TABS Take 1,100 mg by mouth daily.     Probiotic Product (PROBIOTIC PO) Take 1 capsule by mouth daily.     triamcinolone  cream (KENALOG ) 0.1 % Apply 1 Application topically 2 (two) times daily. 454 g 0   triamcinolone  ointment (KENALOG ) 0.1 % Apply three times weekly, increasing to daily for flares as needed 30 g 4   vitamin B-12 (CYANOCOBALAMIN ) 1000 MCG tablet Take 1,000 mcg by mouth daily.     zonisamide (ZONEGRAN) 100 MG capsule Take 100 mg by mouth daily.     amLODipine  (NORVASC ) 5 MG tablet Take  1 tablet (5 mg total) by mouth daily. 90 tablet 3   Evolocumab  (REPATHA  SURECLICK) 140 MG/ML SOAJ Inject 140 mg into the skin every 14 (fourteen) days. Please bill secondary  -  ID: [id number],   BIN: [id number],    PCN: [id number],  Group: [id number] 6 mL 1   furosemide  (LASIX ) 40 MG tablet TAKE 1 TABLET BY MOUTH ONCE DAILY HOLD  IF  NO  AM  LEG  SWELLING 90 tablet 0   losartan  (COZAAR ) 25 MG tablet Take 1 tablet by mouth once daily 90 tablet 0   No facility-administered medications prior to visit.     Per HPI unless specifically indicated in ROS section below Review of Systems  Constitutional:  Negative for fatigue and fever.  HENT:  Negative for congestion.   Eyes:  Negative for pain.  Respiratory:  Negative for cough and  shortness of breath.   Cardiovascular:  Negative for chest pain, palpitations and leg swelling.  Gastrointestinal:  Negative for abdominal pain.  Genitourinary:  Negative for dysuria and vaginal bleeding.  Musculoskeletal:  Negative for back pain.  Neurological:  Negative for syncope, light-headedness and headaches.  Psychiatric/Behavioral:  Negative for dysphoric mood.     Objective:  BP 110/62   Pulse 67   Temp 97.8 F (36.6 C) (Temporal)   Ht 5' 1.5 (1.562 m)   Wt 110 lb 2 oz (50 kg)   SpO2 99%   BMI 20.47 kg/m   Wt Readings from Last 3 Encounters:  09/06/24 110 lb 2 oz (50 kg)  09/05/24 109 lb (49.4 kg)  08/25/24 110 lb 6 oz (50.1 kg)      Physical Exam Constitutional:      General: She is not in acute distress.    Appearance: Normal appearance. She is well-developed. She is not ill-appearing or toxic-appearing.  HENT:     Head: Normocephalic.     Right Ear: Hearing, tympanic membrane, ear canal and external ear normal. Tympanic membrane is not erythematous, retracted or bulging.     Left Ear: Hearing, tympanic membrane, ear canal and external ear normal. Tympanic membrane is not erythematous, retracted or bulging.     Nose: No mucosal edema or rhinorrhea.     Right Sinus: No maxillary sinus tenderness or frontal sinus tenderness.     Left Sinus: No maxillary sinus tenderness or frontal sinus tenderness.     Mouth/Throat:     Pharynx: Uvula midline.  Eyes:     General: Lids are normal. Lids are everted, no foreign bodies appreciated.     Conjunctiva/sclera: Conjunctivae normal.     Pupils: Pupils are equal, round, and reactive to light.  Neck:     Thyroid : No thyroid  mass or thyromegaly.     Vascular: No carotid bruit.     Trachea: Trachea normal.  Cardiovascular:     Rate and Rhythm: Normal rate and regular rhythm.     Pulses: Normal pulses.     Heart sounds: Normal heart sounds, S1 normal and S2 normal. No murmur heard.    No friction rub. No gallop.   Pulmonary:     Effort: Pulmonary effort is normal. No tachypnea or respiratory distress.     Breath sounds: Normal breath sounds. No decreased breath sounds, wheezing, rhonchi or rales.  Abdominal:     General: Bowel sounds are normal.     Palpations: Abdomen is soft.     Tenderness: There is no abdominal tenderness.  Musculoskeletal:     Cervical back: Normal  range of motion and neck supple.  Skin:    General: Skin is warm and dry.     Findings: No rash.  Neurological:     Mental Status: She is alert.  Psychiatric:        Mood and Affect: Mood is not anxious or depressed.        Speech: Speech normal.        Behavior: Behavior normal. Behavior is cooperative.        Thought Content: Thought content normal.        Judgment: Judgment normal.       Results for orders placed or performed in visit on 08/30/24  Comprehensive metabolic panel   Collection Time: 08/30/24  8:16 AM  Result Value Ref Range   Sodium 140 135 - 145 mEq/L   Potassium 3.8 3.5 - 5.1 mEq/L   Chloride 107 96 - 112 mEq/L   CO2 26 19 - 32 mEq/L   Glucose, Bld 100 (H) 70 - 99 mg/dL   BUN 9 6 - 23 mg/dL   Creatinine, Ser 9.12 0.40 - 1.20 mg/dL   Total Bilirubin 0.3 0.2 - 1.2 mg/dL   Alkaline Phosphatase 69 39 - 117 U/L   AST 17 0 - 37 U/L   ALT 13 0 - 35 U/L   Total Protein 5.9 (L) 6.0 - 8.3 g/dL   Albumin 3.8 3.5 - 5.2 g/dL   GFR 37.96 >39.99 mL/min   Calcium  8.9 8.4 - 10.5 mg/dL  Vitamin A87   Collection Time: 08/30/24  8:16 AM  Result Value Ref Range   Vitamin B-12 466 211 - 911 pg/mL  Lipid panel   Collection Time: 08/30/24  8:16 AM  Result Value Ref Range   Cholesterol 112 0 - 200 mg/dL   Triglycerides 815.9 (H) 0.0 - 149.0 mg/dL   HDL 43.29 >60.99 mg/dL   VLDL 63.1 0.0 - 59.9 mg/dL   LDL Cholesterol 18 0 - 99 mg/dL   Total CHOL/HDL Ratio 2    NonHDL 55.18     Assessment and Plan The patient's preventative maintenance and recommended screening tests for an annual wellness exam were reviewed  in full today. Brought up to date unless services declined.  Counselled on the importance of diet, exercise, and its role in overall health and mortality. The patient's FH and SH was reviewed, including their home life, tobacco status, and drug and alcohol status.   Vaccines:  Uptodate COVID, flu vaccine.  Up-to-date with tetanus, Shingrix Pap/DVE: Not indicated given age Mammo: Not indicated given age, Bone Density: September 09, 2017,  osteopenia, T-1.7 due for reevaluation Colon: No further indicated given age Smoking Status: None ETOH/ drug use: None/known  Routine general medical examination at a health care facility  Hyperlipidemia, unspecified hyperlipidemia type Assessment & Plan: No statin due to apparent side effects.  On repatha  q 2 weeks per lipid clinic.   Orders: -     Repatha  SureClick; Inject 140 mg into the skin every 14 (fourteen) days. Please bill secondary  -  ID: [id number],   BIN: [id number],    PCN: [id number],  Group: [id number]  Dispense: 6 mL; Refill: 1  Vitamin B12 deficiency Assessment & Plan:  On supplement.   Crohn's disease without complication, unspecified gastrointestinal tract location [facility]) Assessment & Plan:  Chronic, S/P  partial colectomy.   Essential hypertension, benign Assessment & Plan: Stable, chronic.  Continue current medication.  amlodipine  5 mg daily and losartan  25 mg daily.   Aortic atherosclerosis  Assessment & Plan:  On repatha .   Osteopenia, unspecified location -     DG Bone Density; Future  Other orders -     amLODIPine  Besylate; Take 1 tablet (5 mg total) by mouth daily.  Dispense: 90 tablet; Refill: 3 -     Furosemide ; Take 0.5 tablets (20 mg total) by mouth daily.  Dispense: 90 tablet; Refill: 1 -     Losartan  Potassium; Take 1 tablet (25 mg total) by mouth daily.  Dispense: 90 tablet; Refill: 3     Return in about 1 year (around 09/06/2025) for phone AMW,  fasting labs then annual with me.   Greig Ring, MD

## 2024-09-06 NOTE — Assessment & Plan Note (Signed)
 Chronic, but rarely .  Rarely uses tranxene... has not used  more than 4-5 times a year.

## 2024-09-06 NOTE — Assessment & Plan Note (Signed)
 No further spells

## 2024-09-06 NOTE — Assessment & Plan Note (Signed)
 Chronic, S/P  partial colectomy.

## 2024-09-07 DIAGNOSIS — H2513 Age-related nuclear cataract, bilateral: Secondary | ICD-10-CM | POA: Diagnosis not present

## 2024-10-18 ENCOUNTER — Encounter: Payer: Self-pay | Admitting: Cardiology

## 2024-10-18 ENCOUNTER — Ambulatory Visit: Attending: Cardiology | Admitting: Cardiology

## 2024-10-18 VITALS — BP 119/67 | HR 71 | Ht 61.5 in | Wt 110.0 lb

## 2024-10-18 DIAGNOSIS — M791 Myalgia, unspecified site: Secondary | ICD-10-CM

## 2024-10-18 DIAGNOSIS — T466X5D Adverse effect of antihyperlipidemic and antiarteriosclerotic drugs, subsequent encounter: Secondary | ICD-10-CM

## 2024-10-18 DIAGNOSIS — I700 Atherosclerosis of aorta: Secondary | ICD-10-CM

## 2024-10-18 DIAGNOSIS — R55 Syncope and collapse: Secondary | ICD-10-CM

## 2024-10-18 DIAGNOSIS — I447 Left bundle-branch block, unspecified: Secondary | ICD-10-CM

## 2024-10-18 DIAGNOSIS — Z8249 Family history of ischemic heart disease and other diseases of the circulatory system: Secondary | ICD-10-CM | POA: Diagnosis not present

## 2024-10-18 DIAGNOSIS — K5090 Crohn's disease, unspecified, without complications: Secondary | ICD-10-CM

## 2024-10-18 MED ORDER — METOPROLOL TARTRATE 25 MG PO TABS
25.0000 mg | ORAL_TABLET | ORAL | 0 refills | Status: AC
Start: 2024-10-18 — End: ?

## 2024-10-18 NOTE — Patient Instructions (Signed)
 Medication Instructions:  The current medical regimen is effective;  continue present plan and medications.  *If you need a refill on your cardiac medications before your next appointment, please call your pharmacy*  Testing/Procedures:   Your cardiac CT will be scheduled at:  [facility] [address]  [place], [place] [address]  Please enter the parking lot using the [address] entrance and use the FREE valet service at the patient drop-off area. Enter the building and check-in with registration on the main floor.  Please follow these instructions carefully (unless otherwise directed):  An IV will be required for this test and Nitroglycerin will be given.  Hold all erectile dysfunction medications at least 3 days (72 hrs) prior to test. (Ie viagra, cialis, sildenafil, tadalafil, etc)   On the Night Before the Test: Be sure to Drink plenty of water. Do not consume any caffeinated/decaffeinated beverages or chocolate 12 hours prior to your test. Do not take any antihistamines 12 hours prior to your test.  On the Day of the Test: Drink plenty of water until 1 hour prior to the test. Do not eat any food 1 hour prior to test. You may take your regular medications prior to the test.  Take metoprolol  (Lopressor ) two hours prior to test. If you take Furosemide /Hydrochlorothiazide/Spironolactone/Chlorthalidone, please HOLD on the morning of the test. Patients who wear a continuous glucose monitor MUST remove the device prior to scanning. FEMALES- please wear underwire-free bra if available, avoid dresses & tight clothing  After the Test: Drink plenty of water. After receiving IV contrast, you may experience a mild flushed feeling. This is normal. On occasion, you may experience a mild rash up to 24 hours after the test. This is not dangerous. If this occurs, you can take Benadryl  25 mg, Zyrtec, Claritin, or Allegra and increase your fluid intake.  (Patients taking Tikosyn should avoid Benadryl , and may take Zyrtec, Claritin, or Allegra) If you experience trouble breathing, this can be serious. If it is severe call 911 IMMEDIATELY. If it is mild, please call our office.  We will call to schedule your test 2-4 weeks out understanding that some insurance companies will need an authorization prior to the service being performed.   For more information and frequently asked questions, please visit our website : [email or web address]  For non-scheduling related questions, please contact the cardiac imaging nurse navigator should you have any questions/concerns: Cardiac Imaging Nurse Navigators Direct Office Dial: [phone]   For scheduling needs, including cancellations and rescheduling, please call Brittany, [phone].   Follow-Up: At [facility], you and your health needs are our priority.  As part of our continuing mission to provide you with exceptional heart care, our providers are all part of one team.  This team includes your primary Cardiologist (physician) and Advanced Practice Providers or APPs (Physician Assistants and Nurse Practitioners) who all work together to provide you with the care you need, when you need it.  Your next appointment:   6 month(s)  Provider:   One of our Advanced Practice Providers (APPs): Morse Clause, PA-C  Lamarr Satterfield, NP Miriam Shams, NP  Olivia Pavy, PA-C Josefa Beauvais, NP  Leontine Salen, PA-C Orren Fabry, PA-C  Lopeno, PA-C Ernest Dick, NP  Damien Braver, NP Jon Hails, PA-C  Waddell Donath, PA-C    Dayna Dunn, PA-C  Scott Weaver, PA-C Lum Louis, NP Katlyn West, NP Callie Goodrich, PA-C  Xika Zhao, NP Sheng Haley, PA-C    Kathleen  Vicci, PA-C       We recommend signing up for the patient portal called MyChart.  Sign up information is provided on this After Visit Summary.  MyChart is used to connect with patients for Virtual Visits  (Telemedicine).  Patients are able to view lab/test results, encounter notes, upcoming appointments, etc.  Non-urgent messages can be sent to your provider as well.   To learn more about what you can do with MyChart, go to [email or web address].

## 2024-10-18 NOTE — Progress Notes (Signed)
 Cardiology Office Note:  .   Date:  10/18/2024  ID:  [name], DOB 15-Apr-1941, MRN [id number] PCP: Avelina Greig BRAVO, MD  [facility] Providers Cardiologist:  None    History of Present Illness: .   [name] is a 83 y.o. female Discussed the use of AI scribe  History of Present Illness [name] is an 83 year old female with a history of TIA and aortic atherosclerosis who presents with fatigue. She is accompanied by her family.  She experiences fatigue and short-term memory loss, feeling generally unwell. No chest pain is present, but she has back pain, particularly in the upper back and shoulder, attributed to an old injury from a fall.  She has had several fainting spells, especially during the summer and early fall, often while working outside or after taking hot showers in a heated bathroom. These episodes have required assistance from her husband to return to the house or bed. She has not had any more fainting episodes since adjusting her shower routine to prevent overheating.  Her hypertension is well controlled with amlodipine  5 mg daily and losartan  25 mg daily. She has a history of statin intolerance and is currently taking Repatha  for cholesterol management. She often feels cold, wearing sweaters indoors.  Her cardiac history includes a left bundle branch block noted on a previous EKG and an echocardiogram from July 2025 showing an ejection fraction of 60-65% with grade one diastolic dysfunction and no significant valvular abnormalities.  She has a history of Crohn's disease and has undergone a partial colectomy, which is currently stable.      Studies Reviewed: .        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 (01/2023) Echocardiogram: Ejection fraction 60-65%, grade 1 diastolic dysfunction, no significant valvular abnormalities (07/06/2024) Risk Assessment/Calculations:            Physical Exam:   VS:   BP 119/67   Pulse 71   Ht 5' 1.5 (1.562 m)   Wt 110 lb (49.9 kg)   SpO2 96%   BMI 20.45 kg/m    Wt Readings from Last 3 Encounters:  10/18/24 110 lb (49.9 kg)  09/06/24 110 lb 2 oz (50 kg)  09/05/24 109 lb (49.4 kg)    GEN: Well nourished, well developed in no acute distress NECK: No JVD; No carotid bruits CARDIAC: RRR, no murmurs, no rubs, no gallops RESPIRATORY:  Clear to auscultation without rales, wheezing or rhonchi  ABDOMEN: Soft, non-tender, non-distended EXTREMITIES:  No edema; No deformity   ASSESSMENT AND PLAN: .    Assessment and Plan Assessment & Plan Fatigue and syncope likely related to orthostatic hypotension from heat exposure Episodes of syncope likely due to orthostatic hypotension from heat exposure, particularly in hot showers. No recent episodes since modifying bathroom environment. No chest pain or significant shortness of breath reported. - Advised to call EMS if another syncope episode occurs to assess heart rate and blood pressure. - Will consider Zio patch monitoring if syncope episodes recur.  Left bundle branch block Noted on previous EKGs. No current chest pain or significant shortness of breath. Family history of early heart disease. Discussed potential association with coronary artery disease.  Has some decreased stamina, shortness of breath with activity.  This could be an anginal equivalent. - Ordered coronary CT scan to evaluate for coronary artery disease. - Will consider metoprolol  25 mg if indicated.  Hypertension, well controlled Hypertension is well controlled  with current medication regimen of amlodipine  and losartan .  Aortic atherosclerosis  Statin intolerance with ongoing lipid management using Repatha  Statin intolerance due to muscle cramping. Lipid management ongoing with Repatha . - Continue Repatha  for lipid management.         Dispo: 6 mths APP  Signed, Oneil Parchment, MD

## 2024-11-08 ENCOUNTER — Encounter (HOSPITAL_COMMUNITY): Payer: Self-pay

## 2024-11-10 ENCOUNTER — Ambulatory Visit (HOSPITAL_COMMUNITY)
Admission: RE | Admit: 2024-11-10 | Discharge: 2024-11-10 | Disposition: A | Source: Ambulatory Visit | Attending: Cardiology | Admitting: Cardiology

## 2024-11-10 DIAGNOSIS — I447 Left bundle-branch block, unspecified: Secondary | ICD-10-CM | POA: Diagnosis not present

## 2024-11-10 DIAGNOSIS — R55 Syncope and collapse: Secondary | ICD-10-CM | POA: Diagnosis not present

## 2024-11-10 MED ORDER — IOHEXOL 350 MG/ML SOLN
100.0000 mL | Freq: Once | INTRAVENOUS | Status: AC | PRN
  Administered 2024-11-10: 100 mL via INTRAVENOUS

## 2024-11-10 MED ORDER — NITROGLYCERIN 0.4 MG SL SUBL
0.8000 mg | SUBLINGUAL_TABLET | Freq: Once | SUBLINGUAL | Status: AC
  Administered 2024-11-10: 0.8 mg via SUBLINGUAL

## 2024-11-13 ENCOUNTER — Ambulatory Visit: Payer: Self-pay | Admitting: Cardiology

## 2024-11-27 ENCOUNTER — Other Ambulatory Visit: Payer: Self-pay

## 2024-11-27 ENCOUNTER — Emergency Department
Admission: EM | Admit: 2024-11-27 | Discharge: 2024-11-27 | Disposition: A | Discharge status: Home / Self Care | Attending: Emergency Medicine | Admitting: Emergency Medicine

## 2024-11-27 ENCOUNTER — Emergency Department

## 2024-11-27 ENCOUNTER — Encounter: Payer: Self-pay | Admitting: Emergency Medicine

## 2024-11-27 DIAGNOSIS — R0602 Shortness of breath: Secondary | ICD-10-CM | POA: Diagnosis present

## 2024-11-27 DIAGNOSIS — R059 Cough, unspecified: Secondary | ICD-10-CM | POA: Diagnosis not present

## 2024-11-27 DIAGNOSIS — J101 Influenza due to other identified influenza virus with other respiratory manifestations: Secondary | ICD-10-CM | POA: Insufficient documentation

## 2024-11-27 DIAGNOSIS — R531 Weakness: Secondary | ICD-10-CM | POA: Insufficient documentation

## 2024-11-27 DIAGNOSIS — R051 Acute cough: Secondary | ICD-10-CM

## 2024-11-27 LAB — CBC
HCT: 40.1 % (ref 36.0–46.0)
Hemoglobin: 13.3 g/dL (ref 12.0–15.0)
MCH: 29.7 pg (ref 26.0–34.0)
MCHC: 33.2 g/dL (ref 30.0–36.0)
MCV: 89.5 fL (ref 80.0–100.0)
Platelets: 174 K/uL (ref 150–400)
RBC: 4.48 MIL/uL (ref 3.87–5.11)
RDW: 13.9 % (ref 11.5–15.5)
WBC: 8.5 K/uL (ref 4.0–10.5)
nRBC: 0 % (ref 0.0–0.2)

## 2024-11-27 LAB — RESP PANEL BY RT-PCR (RSV, FLU A&B, COVID)  RVPGX2
Influenza A by PCR: POSITIVE — AB
Influenza B by PCR: NEGATIVE
Resp Syncytial Virus by PCR: NEGATIVE
SARS Coronavirus 2 by RT PCR: NEGATIVE

## 2024-11-27 LAB — BASIC METABOLIC PANEL WITH GFR
Anion gap: 12 (ref 5–15)
BUN: 8 mg/dL (ref 8–23)
CO2: 20 mmol/L — ABNORMAL LOW (ref 22–32)
Calcium: 8.7 mg/dL — ABNORMAL LOW (ref 8.9–10.3)
Chloride: 102 mmol/L (ref 98–111)
Creatinine, Ser: 0.89 mg/dL (ref 0.44–1.00)
GFR, Estimated: >60 mL/min
Glucose, Bld: 189 mg/dL — ABNORMAL HIGH (ref 70–99)
Potassium: 4.1 mmol/L (ref 3.5–5.1)
Sodium: 134 mmol/L — ABNORMAL LOW (ref 135–145)

## 2024-11-27 MED ORDER — ONDANSETRON 4 MG PO TBDP: 4.0000 mg | ORAL_TABLET | Freq: Three times a day (TID) | ORAL | 0 refills | Status: DC | PRN

## 2024-11-27 MED ORDER — OSELTAMIVIR PHOSPHATE 75 MG PO CAPS: 75.0000 mg | ORAL_CAPSULE | Freq: Two times a day (BID) | ORAL | 0 refills | Status: AC

## 2024-11-27 MED ORDER — OSELTAMIVIR PHOSPHATE 75 MG PO CAPS
75.0000 mg | ORAL_CAPSULE | Freq: Once | ORAL | Status: AC
  Administered 2024-11-27: 75 mg via ORAL
  Filled 2024-11-27: qty 1

## 2024-11-27 MED ORDER — OSELTAMIVIR PHOSPHATE 75 MG PO CAPS: 75.0000 mg | ORAL_CAPSULE | Freq: Two times a day (BID) | ORAL | 0 refills | Status: DC

## 2024-11-27 MED ORDER — ONDANSETRON 4 MG PO TBDP: 4.0000 mg | ORAL_TABLET | Freq: Three times a day (TID) | ORAL | 0 refills | Status: AC | PRN

## 2024-11-27 MED ORDER — OSELTAMIVIR PHOSPHATE 75 MG PO CAPS
75.0000 mg | ORAL_CAPSULE | Freq: Once | ORAL | Status: DC
  Filled 2024-11-27: qty 1

## 2024-11-27 NOTE — Discharge Instructions (Addendum)
 Please make sure to keep yourself hydrated.  Please follow-up with your primary care doctor early this week to get reassessed.  Please take the Tamiflu  as prescribed for your influenza.  I have also sent some Zofran  to your pharmacy in case you get nauseous.  If your pulse oximeter dips less than 93%, please return to be seen.

## 2024-11-27 NOTE — ED Provider Notes (Addendum)
 [facility] Provider Note    Event Date/Time   First MD Initiated Contact with Patient 11/27/24 1843     (approximate)   History   Fever and Cough   HPI  [name] is a 83 y.o. female with history of GERD, anxiety, hyperlipidemia, IBS, presenting with cough.  Symptoms started yesterday.  Also notes increased fatigue, fever of 102.  Has family member testing positive for flu A.  Does note nausea.  Denies any chest pain.  No leg swelling.  Does have some shortness of breath but no history of asthma or COPD.  Patient denies any nausea at this time.  On independent chart review, patient was seen by cardiology in November, has short-term memory loss, has history of fatigue and syncope thought related to orthostasis from heat exposure.  Also history of left bundle branch block.     Physical Exam   Triage Vital Signs: ED Triage Vitals  Encounter Vitals Group     BP 11/27/24 1604 (!) 120/52     Girls Systolic BP Percentile --      Girls Diastolic BP Percentile --      Boys Systolic BP Percentile --      Boys Diastolic BP Percentile --      Pulse Rate 11/27/24 1604 95     Resp 11/27/24 1604 18     Temp 11/27/24 1604 99.2 F (37.3 C)     Temp Source 11/27/24 1604 Oral     SpO2 11/27/24 1604 97 %     Weight 11/27/24 1605 110 lb 0.2 oz (49.9 kg)     Height 11/27/24 1605 5' 1.5 (1.562 m)     Head Circumference --      Peak Flow --      Pain Score --      Pain Loc --      Pain Education --      Exclude from Growth Chart --     Most recent vital signs: Vitals:   11/27/24 1604  BP: (!) 120/52  Pulse: 95  Resp: 18  Temp: 99.2 F (37.3 C)  SpO2: 97%     General: Awake, no distress.  CV:  Good peripheral perfusion.  Resp:  Normal effort.  Clear, no tachypnea or respiratory distress Abd:  No distention.  Soft nontender Other:  Moving 4 extremities   ED Results / Procedures / Treatments   Labs (all labs ordered are listed, but only  abnormal results are displayed) Labs Reviewed  RESP PANEL BY RT-PCR (RSV, FLU A&B, COVID)  RVPGX2 - Abnormal; Notable for the following components:      Result Value   Influenza A by PCR POSITIVE (*)    All other components within normal limits  BASIC METABOLIC PANEL WITH GFR - Abnormal; Notable for the following components:   Sodium 134 (*)    CO2 20 (*)    Glucose, Bld 189 (*)    Calcium  8.7 (*)    All other components within normal limits  CBC       RADIOLOGY On my independent interpretation, x-ray without obvious consolidation   PROCEDURES:  Critical Care performed: No  Procedures   MEDICATIONS ORDERED IN ED: Medications  oseltamivir  (TAMIFLU ) capsule 75 mg (75 mg Oral Given 11/27/24 1922)     IMPRESSION / MDM / ASSESSMENT AND PLAN / ED COURSE  I reviewed the triage vital signs and the nursing notes.  Differential diagnosis includes, but is not limited to, viral illness, pneumonia, bronchitis, no chest pain shortness of breath suggest ACS.  Will get labs, respiratory viral panel.  Chest x-ray.  Patient's presentation is most consistent with acute presentation with potential threat to life or bodily function.  Independent interpretation of labs and imaging below.  Discussed with patient and daughter about imaging and lab results including findings of flu positive result.  Discussed with them about Tamiflu  including recent benefits, they would like to proceed, I will give her first dose of Tamiflu  here.  Encouraged hydration, she is not nauseous at this time.  She is tolerating p.o.  Discussed with them about following up with primary care early this week to get reassessed.  Also to return if she has any persistent worsening symptoms, if she is looking more dehydrated, if she is unable to tolerate p.o., if her pulse oximeter is less than 93%.  Considered but no indication for inpatient admission at this time, she safe for outpatient  management.  Will discharge with strict return precautions.  Shared decision making done with patient and daughter and they are agreeable with this plan.    Clinical Course as of 11/27/24 1924  Sun Nov 27, 2024  8150 Independent review of labs, no leukocytosis, electrolytes not severely deranged, she is flu a positive. [TT]  1849 DG Chest 2 View 1. No acute cardiopulmonary findings.  [TT]    Clinical Course User Index [TT] Waymond Lorelle Cummins, MD     FINAL CLINICAL IMPRESSION(S) / ED DIAGNOSES   Final diagnoses:  Acute cough  Shortness of breath  Influenza A  Weakness     Rx / DC Orders   ED Discharge Orders          Ordered    oseltamivir  (TAMIFLU ) 75 MG capsule  2 times daily,   Status:  Discontinued        11/27/24 1859    ondansetron  (ZOFRAN -ODT) 4 MG disintegrating tablet  Every 8 hours PRN,   Status:  Discontinued        11/27/24 1859    oseltamivir  (TAMIFLU ) 75 MG capsule  2 times daily,   Status:  Discontinued        11/27/24 1901    oseltamivir  (TAMIFLU ) 75 MG capsule  2 times daily,   Status:  Discontinued        11/27/24 1904    oseltamivir  (TAMIFLU ) 75 MG capsule  2 times daily        11/27/24 1905    ondansetron  (ZOFRAN -ODT) 4 MG disintegrating tablet  Every 8 hours PRN        11/27/24 1911             Note:  This document was prepared using Dragon voice recognition software and may include unintentional dictation errors.    Waymond Lorelle Cummins, MD 11/27/24 ELVIS    Waymond Lorelle Cummins, MD 11/27/24 [phone]

## 2024-11-27 NOTE — ED Triage Notes (Signed)
 Pt with daughter who reports cough started yesterday. Fever of 102 today with fatigue. Family visited Friday and tested + for Flu A. Endorses nausea and fatigue.

## 2024-11-28 ENCOUNTER — Other Ambulatory Visit: Payer: Self-pay

## 2024-11-28 MED ORDER — OSELTAMIVIR PHOSPHATE 75 MG PO CAPS
75.0000 mg | ORAL_CAPSULE | Freq: Two times a day (BID) | ORAL | 0 refills | Status: AC
  Filled 2024-11-28 (×2): qty 10, 5d supply, fill #0

## 2025-01-09 NOTE — Progress Notes (Unsigned)
 " Cardiology Office Note:  .   Date:  01/09/2025  ID:  [name], DOB 05/23/1941, MRN [id number] PCP: Avelina Greig BRAVO, MD  [facility] Providers Cardiologist:  None    Patient Profile: .      PMH Dyslipidemia Crohn's disease s/p partial colectomy Aortic atherosclerosis Left BBB Statin myalgia Family history early CAD Father had MI age 83  Referred to [facility] clinic and seen by Dr. Mona on 07/18/2022.  Lipid profile February 2023 showed total cholesterol 225, triglycerides 121, HDL 55, and LDL 146.  Unfortunately she has been intolerant to statins which caused severe muscle pain.  Her father had an MI at age 50.  She underwent cardiac catheterization in 1997 that showed only mild abnormalities in the coronary arteries. She was started on Praluent  which she tolerated well. Lp(a) was negative at 8.4 nmol/L and lipids improved with total cholesterol 116, LDL 31, HDL 50, and triglycerides 843.  Seen by me on 10/21/23  for follow-up of dyslipidemia. Lipid panel 10/13/23 revealed total cholesterol 119, triglycerides 205, HDL 56, LDL-C 31 and LDL particle number 395. Accompanied by her daughter due to concerns with memory loss, she has to have a companion. Maintaining diet primarily consisting of vegetables and small portions of meat. Admits to consuming bread, particularly during lunchtime, and enjoys yeast rolls on Sundays. Dessert intake includes three lemon Oreos at lunch and ice cream on Sundays. She also drinks juice in small quantities. No concerning cardiac symptoms. Frequently has right shoulder pain, which she manages with a heating pad. The pain is not associated with physical activity and varies with movement and position changes. [place] but attempts to walk up and down her driveway when possible. No concerns with Repatha  injections. Asks if her PCP can manage Repatha  going forward. Planning to change insurance  companies in January and requesting a paper prescription to facilitate this transition.   Echo 07/06/2024 with normal LVEF 60 to 65%, G1 DD, normal RV, trivial MR and TR.  Seen by Dr. Jeffrie 10/18/24 for fatigue. Noted some exertional shortness of breath. Was generally feeling unwell but no chest pain. Several fainting episodes during the summer and early fall, often while working outside or after taking hot showers.  These episodes required assistance from her husband to return to bed or into the house.  She had modified her exposure to heat while showering and the symptoms had improved.  Coronary CT 11/10/2024 revealed CAC score of 103 (44th percentile), nonobstructive CAD with only 1 to 24% calcified plaque in LAD and left main, left dominant coronary arteries.       History of Present Illness: .   [name] is a very pleasant 84 y.o. female who is here today  Discussed the use of AI scribe software for clinical note transcription with the patient, who gave verbal consent to proceed.   ROS: See HPI       Studies Reviewed: .        Risk Assessment/Calculations:     No BP recorded.  {Refresh Note OR Click here to enter BP  :1}***       Physical Exam:   VS:  There were no vitals taken for this visit.   Wt Readings from Last 3 Encounters:  11/27/24 110 lb 0.2 oz (49.9 kg)  10/18/24 110 lb (49.9 kg)  09/06/24 110 lb 2 oz (50 kg)    GEN: Well nourished, well  developed in no acute distress NECK: No JVD; No carotid bruits CARDIAC: RRR, soft systolic murmurs RUSB. No rubs, gallops RESPIRATORY:  Clear to auscultation without rales, wheezing or rhonchi  ABDOMEN: Soft, non-tender, non-distended EXTREMITIES:  No edema; No deformity     ASSESSMENT AND PLAN: .    Dyslipidemia: LDL-C 56, triglycerides 205, HDL-C 56, total cholesterol 119 on 10/13/23.  Will.  We discussed mildly elevated triglycerides. No concerning side effects with Repatha . She admits to eating 3 Oreo cookies at  lunchtime as well as ice cream on Sundays. She gets limited exercise.  Advised that triglyceride elevation is not concerning for pancreatitis. Consider walking for 10 minutes after meals and consuming less sugar and full fat dairy products.  We will continue Repatha .  She has asked for a paper prescription due to an upcoming change in her insurance and she is not sure which pharmacy she will be using.  Murmur: She has a soft systolic murmur in the right upper sternal border on exam. Reviewed echo from 01/2022 with patient and her daughter which revealed normal LVEF, G1 DD, mild LVH, no significant valve disease. She is asymptomatic. Advised we will continue to monitor clinically for now and to report concerning symptoms if they arise.   Shoulder pain: She reports intermittent right shoulder pain that seems to be exacerbated by position changes. Pain does not worsen with exertion. Symptoms improve with use of heat. Symptoms atypical for angina.  Recommended she continue current strategies of heating pad and positioning for comfort.        Dispo: ***  Signed, Rosaline Bane, NP-C "

## 2025-01-11 ENCOUNTER — Encounter (HOSPITAL_BASED_OUTPATIENT_CLINIC_OR_DEPARTMENT_OTHER): Admitting: Nurse Practitioner

## 2025-02-15 ENCOUNTER — Encounter (HOSPITAL_BASED_OUTPATIENT_CLINIC_OR_DEPARTMENT_OTHER): Admitting: Nurse Practitioner

## 2025-02-21 ENCOUNTER — Ambulatory Visit: Payer: Medicare Other | Admitting: Dermatology

## 2025-09-06 ENCOUNTER — Ambulatory Visit
